# Patient Record
Sex: Female | Born: 1975 | Race: White | Hispanic: No | Marital: Single | Smoking: Current every day smoker
Health system: Southern US, Community
[De-identification: ages and names within clinical notes are randomized; demographics above are authoritative.]

## PROBLEM LIST (undated history)

## (undated) DIAGNOSIS — F319 Bipolar disorder, unspecified: Secondary | ICD-10-CM

## (undated) DIAGNOSIS — F32A Depression, unspecified: Secondary | ICD-10-CM

## (undated) DIAGNOSIS — I499 Cardiac arrhythmia, unspecified: Secondary | ICD-10-CM

## (undated) DIAGNOSIS — G5600 Carpal tunnel syndrome, unspecified upper limb: Secondary | ICD-10-CM

## (undated) DIAGNOSIS — M1990 Unspecified osteoarthritis, unspecified site: Secondary | ICD-10-CM

## (undated) DIAGNOSIS — N200 Calculus of kidney: Secondary | ICD-10-CM

## (undated) DIAGNOSIS — N390 Urinary tract infection, site not specified: Secondary | ICD-10-CM

## (undated) DIAGNOSIS — M797 Fibromyalgia: Secondary | ICD-10-CM

## (undated) DIAGNOSIS — F329 Major depressive disorder, single episode, unspecified: Secondary | ICD-10-CM

## (undated) DIAGNOSIS — B1920 Unspecified viral hepatitis C without hepatic coma: Secondary | ICD-10-CM

## (undated) DIAGNOSIS — F4310 Post-traumatic stress disorder, unspecified: Secondary | ICD-10-CM

## (undated) DIAGNOSIS — K219 Gastro-esophageal reflux disease without esophagitis: Secondary | ICD-10-CM

## (undated) DIAGNOSIS — F419 Anxiety disorder, unspecified: Secondary | ICD-10-CM

## (undated) DIAGNOSIS — I10 Essential (primary) hypertension: Secondary | ICD-10-CM

## (undated) DIAGNOSIS — J449 Chronic obstructive pulmonary disease, unspecified: Secondary | ICD-10-CM

## (undated) HISTORY — DX: Post-traumatic stress disorder, unspecified: F43.10

## (undated) HISTORY — PX: WISDOM TOOTH EXTRACTION: SHX21

## (undated) HISTORY — PX: CARPAL TUNNEL RELEASE: SHX101

## (undated) HISTORY — PX: ABDOMINAL HYSTERECTOMY: SHX81

## (undated) HISTORY — DX: Bipolar disorder, unspecified: F31.9

## (undated) HISTORY — PX: TUBAL LIGATION: SHX77

---

## 1997-09-12 ENCOUNTER — Emergency Department (HOSPITAL_COMMUNITY): Admission: EM | Admit: 1997-09-12 | Discharge: 1997-09-13 | Payer: Self-pay | Admitting: Emergency Medicine

## 1998-03-13 ENCOUNTER — Other Ambulatory Visit: Admission: RE | Admit: 1998-03-13 | Discharge: 1998-03-13 | Payer: Self-pay | Admitting: Obstetrics & Gynecology

## 1998-08-21 ENCOUNTER — Inpatient Hospital Stay (HOSPITAL_COMMUNITY): Admission: AD | Admit: 1998-08-21 | Discharge: 1998-08-21 | Payer: Self-pay | Admitting: Obstetrics & Gynecology

## 1998-10-14 ENCOUNTER — Inpatient Hospital Stay (HOSPITAL_COMMUNITY): Admission: AD | Admit: 1998-10-14 | Discharge: 1998-10-16 | Payer: Self-pay | Admitting: Obstetrics and Gynecology

## 1998-10-14 ENCOUNTER — Encounter (INDEPENDENT_AMBULATORY_CARE_PROVIDER_SITE_OTHER): Payer: Self-pay

## 1999-08-04 ENCOUNTER — Other Ambulatory Visit: Admission: RE | Admit: 1999-08-04 | Discharge: 1999-08-04 | Payer: Self-pay | Admitting: Obstetrics & Gynecology

## 2000-02-24 ENCOUNTER — Emergency Department (HOSPITAL_COMMUNITY): Admission: EM | Admit: 2000-02-24 | Discharge: 2000-02-24 | Payer: Self-pay | Admitting: Emergency Medicine

## 2000-02-24 ENCOUNTER — Encounter: Payer: Self-pay | Admitting: Emergency Medicine

## 2000-06-08 ENCOUNTER — Inpatient Hospital Stay (HOSPITAL_COMMUNITY): Admission: EM | Admit: 2000-06-08 | Discharge: 2000-06-14 | Payer: Self-pay | Admitting: *Deleted

## 2000-09-18 ENCOUNTER — Emergency Department (HOSPITAL_COMMUNITY): Admission: EM | Admit: 2000-09-18 | Discharge: 2000-09-18 | Payer: Self-pay | Admitting: Internal Medicine

## 2000-09-28 ENCOUNTER — Emergency Department (HOSPITAL_COMMUNITY): Admission: EM | Admit: 2000-09-28 | Discharge: 2000-09-29 | Payer: Self-pay | Admitting: *Deleted

## 2000-10-01 ENCOUNTER — Emergency Department (HOSPITAL_COMMUNITY): Admission: EM | Admit: 2000-10-01 | Discharge: 2000-10-01 | Payer: Self-pay | Admitting: Emergency Medicine

## 2000-10-03 ENCOUNTER — Emergency Department (HOSPITAL_COMMUNITY): Admission: EM | Admit: 2000-10-03 | Discharge: 2000-10-03 | Payer: Self-pay | Admitting: Emergency Medicine

## 2000-11-25 ENCOUNTER — Emergency Department (HOSPITAL_COMMUNITY): Admission: EM | Admit: 2000-11-25 | Discharge: 2000-11-25 | Payer: Self-pay | Admitting: Emergency Medicine

## 2000-11-25 ENCOUNTER — Encounter: Payer: Self-pay | Admitting: Emergency Medicine

## 2001-02-27 ENCOUNTER — Emergency Department (HOSPITAL_COMMUNITY): Admission: EM | Admit: 2001-02-27 | Discharge: 2001-02-27 | Payer: Self-pay | Admitting: Emergency Medicine

## 2001-02-27 ENCOUNTER — Encounter: Payer: Self-pay | Admitting: Emergency Medicine

## 2001-04-04 ENCOUNTER — Emergency Department (HOSPITAL_COMMUNITY): Admission: EM | Admit: 2001-04-04 | Discharge: 2001-04-04 | Payer: Self-pay | Admitting: Emergency Medicine

## 2001-10-04 ENCOUNTER — Emergency Department (HOSPITAL_COMMUNITY): Admission: EM | Admit: 2001-10-04 | Discharge: 2001-10-04 | Payer: Self-pay | Admitting: Emergency Medicine

## 2001-10-04 ENCOUNTER — Encounter: Payer: Self-pay | Admitting: Emergency Medicine

## 2001-12-13 ENCOUNTER — Inpatient Hospital Stay (HOSPITAL_COMMUNITY): Admission: EM | Admit: 2001-12-13 | Discharge: 2001-12-18 | Payer: Self-pay | Admitting: Psychiatry

## 2002-02-21 ENCOUNTER — Emergency Department (HOSPITAL_COMMUNITY): Admission: EM | Admit: 2002-02-21 | Discharge: 2002-02-21 | Payer: Self-pay | Admitting: Emergency Medicine

## 2002-03-15 ENCOUNTER — Encounter: Payer: Self-pay | Admitting: Emergency Medicine

## 2002-03-15 ENCOUNTER — Emergency Department (HOSPITAL_COMMUNITY): Admission: EM | Admit: 2002-03-15 | Discharge: 2002-03-15 | Payer: Self-pay | Admitting: Emergency Medicine

## 2002-03-23 ENCOUNTER — Emergency Department (HOSPITAL_COMMUNITY): Admission: EM | Admit: 2002-03-23 | Discharge: 2002-03-23 | Payer: Self-pay | Admitting: Emergency Medicine

## 2002-03-28 ENCOUNTER — Encounter: Payer: Self-pay | Admitting: *Deleted

## 2002-03-28 ENCOUNTER — Emergency Department (HOSPITAL_COMMUNITY): Admission: EM | Admit: 2002-03-28 | Discharge: 2002-03-28 | Payer: Self-pay | Admitting: *Deleted

## 2002-04-02 ENCOUNTER — Inpatient Hospital Stay (HOSPITAL_COMMUNITY): Admission: AD | Admit: 2002-04-02 | Discharge: 2002-04-02 | Payer: Self-pay | Admitting: *Deleted

## 2002-04-04 ENCOUNTER — Encounter: Admission: RE | Admit: 2002-04-04 | Discharge: 2002-04-04 | Payer: Self-pay | Admitting: Obstetrics and Gynecology

## 2002-06-05 ENCOUNTER — Emergency Department (HOSPITAL_COMMUNITY): Admission: EM | Admit: 2002-06-05 | Discharge: 2002-06-05 | Payer: Self-pay | Admitting: Emergency Medicine

## 2002-06-05 ENCOUNTER — Encounter: Payer: Self-pay | Admitting: Emergency Medicine

## 2002-07-10 ENCOUNTER — Emergency Department (HOSPITAL_COMMUNITY): Admission: EM | Admit: 2002-07-10 | Discharge: 2002-07-10 | Payer: Self-pay | Admitting: Emergency Medicine

## 2002-07-10 ENCOUNTER — Emergency Department (HOSPITAL_COMMUNITY): Admission: EM | Admit: 2002-07-10 | Discharge: 2002-07-10 | Payer: Self-pay

## 2002-07-11 ENCOUNTER — Encounter: Payer: Self-pay | Admitting: Emergency Medicine

## 2002-10-29 ENCOUNTER — Emergency Department (HOSPITAL_COMMUNITY): Admission: EM | Admit: 2002-10-29 | Discharge: 2002-10-29 | Payer: Self-pay | Admitting: Emergency Medicine

## 2002-12-02 ENCOUNTER — Emergency Department (HOSPITAL_COMMUNITY): Admission: EM | Admit: 2002-12-02 | Discharge: 2002-12-02 | Payer: Self-pay | Admitting: Emergency Medicine

## 2003-01-30 ENCOUNTER — Encounter: Admission: RE | Admit: 2003-01-30 | Discharge: 2003-01-30 | Payer: Self-pay | Admitting: Family Medicine

## 2003-02-17 ENCOUNTER — Inpatient Hospital Stay (HOSPITAL_COMMUNITY): Admission: AD | Admit: 2003-02-17 | Discharge: 2003-02-17 | Payer: Self-pay | Admitting: Obstetrics and Gynecology

## 2003-02-19 ENCOUNTER — Encounter (INDEPENDENT_AMBULATORY_CARE_PROVIDER_SITE_OTHER): Payer: Self-pay | Admitting: *Deleted

## 2003-02-19 ENCOUNTER — Inpatient Hospital Stay (HOSPITAL_COMMUNITY): Admission: RE | Admit: 2003-02-19 | Discharge: 2003-02-21 | Payer: Self-pay | Admitting: Obstetrics and Gynecology

## 2003-02-25 ENCOUNTER — Inpatient Hospital Stay (HOSPITAL_COMMUNITY): Admission: AD | Admit: 2003-02-25 | Discharge: 2003-02-25 | Payer: Self-pay | Admitting: Family Medicine

## 2003-03-04 ENCOUNTER — Encounter: Admission: RE | Admit: 2003-03-04 | Discharge: 2003-03-04 | Payer: Self-pay | Admitting: Obstetrics and Gynecology

## 2003-04-07 ENCOUNTER — Emergency Department (HOSPITAL_COMMUNITY): Admission: EM | Admit: 2003-04-07 | Discharge: 2003-04-07 | Payer: Self-pay | Admitting: Emergency Medicine

## 2003-04-15 ENCOUNTER — Encounter: Admission: RE | Admit: 2003-04-15 | Discharge: 2003-04-15 | Payer: Self-pay | Admitting: Obstetrics and Gynecology

## 2003-06-13 ENCOUNTER — Emergency Department (HOSPITAL_COMMUNITY): Admission: EM | Admit: 2003-06-13 | Discharge: 2003-06-13 | Payer: Self-pay | Admitting: Family Medicine

## 2003-07-13 ENCOUNTER — Emergency Department (HOSPITAL_COMMUNITY): Admission: AD | Admit: 2003-07-13 | Discharge: 2003-07-13 | Payer: Self-pay | Admitting: Family Medicine

## 2003-07-14 ENCOUNTER — Emergency Department (HOSPITAL_COMMUNITY): Admission: EM | Admit: 2003-07-14 | Discharge: 2003-07-14 | Payer: Self-pay | Admitting: Emergency Medicine

## 2003-07-21 ENCOUNTER — Emergency Department (HOSPITAL_COMMUNITY): Admission: EM | Admit: 2003-07-21 | Discharge: 2003-07-21 | Payer: Self-pay

## 2003-07-23 ENCOUNTER — Emergency Department (HOSPITAL_COMMUNITY): Admission: EM | Admit: 2003-07-23 | Discharge: 2003-07-23 | Payer: Self-pay | Admitting: Emergency Medicine

## 2003-07-28 ENCOUNTER — Emergency Department (HOSPITAL_COMMUNITY): Admission: EM | Admit: 2003-07-28 | Discharge: 2003-07-28 | Payer: Self-pay

## 2003-08-01 ENCOUNTER — Emergency Department (HOSPITAL_COMMUNITY): Admission: EM | Admit: 2003-08-01 | Discharge: 2003-08-01 | Payer: Self-pay | Admitting: Family Medicine

## 2003-09-11 ENCOUNTER — Emergency Department (HOSPITAL_COMMUNITY): Admission: EM | Admit: 2003-09-11 | Discharge: 2003-09-11 | Payer: Self-pay | Admitting: Emergency Medicine

## 2004-02-16 ENCOUNTER — Emergency Department (HOSPITAL_COMMUNITY): Admission: EM | Admit: 2004-02-16 | Discharge: 2004-02-16 | Payer: Self-pay | Admitting: Family Medicine

## 2004-08-17 ENCOUNTER — Emergency Department (HOSPITAL_COMMUNITY): Admission: EM | Admit: 2004-08-17 | Discharge: 2004-08-17 | Payer: Self-pay | Admitting: Family Medicine

## 2004-10-14 ENCOUNTER — Emergency Department (HOSPITAL_COMMUNITY): Admission: EM | Admit: 2004-10-14 | Discharge: 2004-10-14 | Payer: Self-pay | Admitting: Emergency Medicine

## 2004-12-02 ENCOUNTER — Emergency Department (HOSPITAL_COMMUNITY): Admission: EM | Admit: 2004-12-02 | Discharge: 2004-12-03 | Payer: Self-pay | Admitting: Emergency Medicine

## 2004-12-03 ENCOUNTER — Emergency Department (HOSPITAL_COMMUNITY): Admission: EM | Admit: 2004-12-03 | Discharge: 2004-12-03 | Payer: Self-pay | Admitting: Family Medicine

## 2005-01-20 ENCOUNTER — Emergency Department (HOSPITAL_COMMUNITY): Admission: EM | Admit: 2005-01-20 | Discharge: 2005-01-20 | Payer: Self-pay | Admitting: Emergency Medicine

## 2005-02-07 ENCOUNTER — Emergency Department (HOSPITAL_COMMUNITY): Admission: EM | Admit: 2005-02-07 | Discharge: 2005-02-07 | Payer: Self-pay | Admitting: Emergency Medicine

## 2005-03-05 ENCOUNTER — Emergency Department (HOSPITAL_COMMUNITY): Admission: EM | Admit: 2005-03-05 | Discharge: 2005-03-05 | Payer: Self-pay | Admitting: Emergency Medicine

## 2005-10-23 ENCOUNTER — Emergency Department (HOSPITAL_COMMUNITY): Admission: EM | Admit: 2005-10-23 | Discharge: 2005-10-23 | Payer: Self-pay | Admitting: Emergency Medicine

## 2005-11-20 ENCOUNTER — Emergency Department (HOSPITAL_COMMUNITY): Admission: EM | Admit: 2005-11-20 | Discharge: 2005-11-21 | Payer: Self-pay | Admitting: Emergency Medicine

## 2006-02-01 IMAGING — CR DG CHEST 2V
2 series · 2 of 2 positions shown · non-contrast
Comparison: none

CLINICAL DATA: Chest pain, cough, and fever.
 TWO-VIEW CHEST, 06/13/03
 Comparison 03/28/02.
 Stable normal-sized heart and clear lungs.  Minimal scoliosis.
 IMPRESSION 
 No acute abnormality.

[view not recorded (1 of 2)]
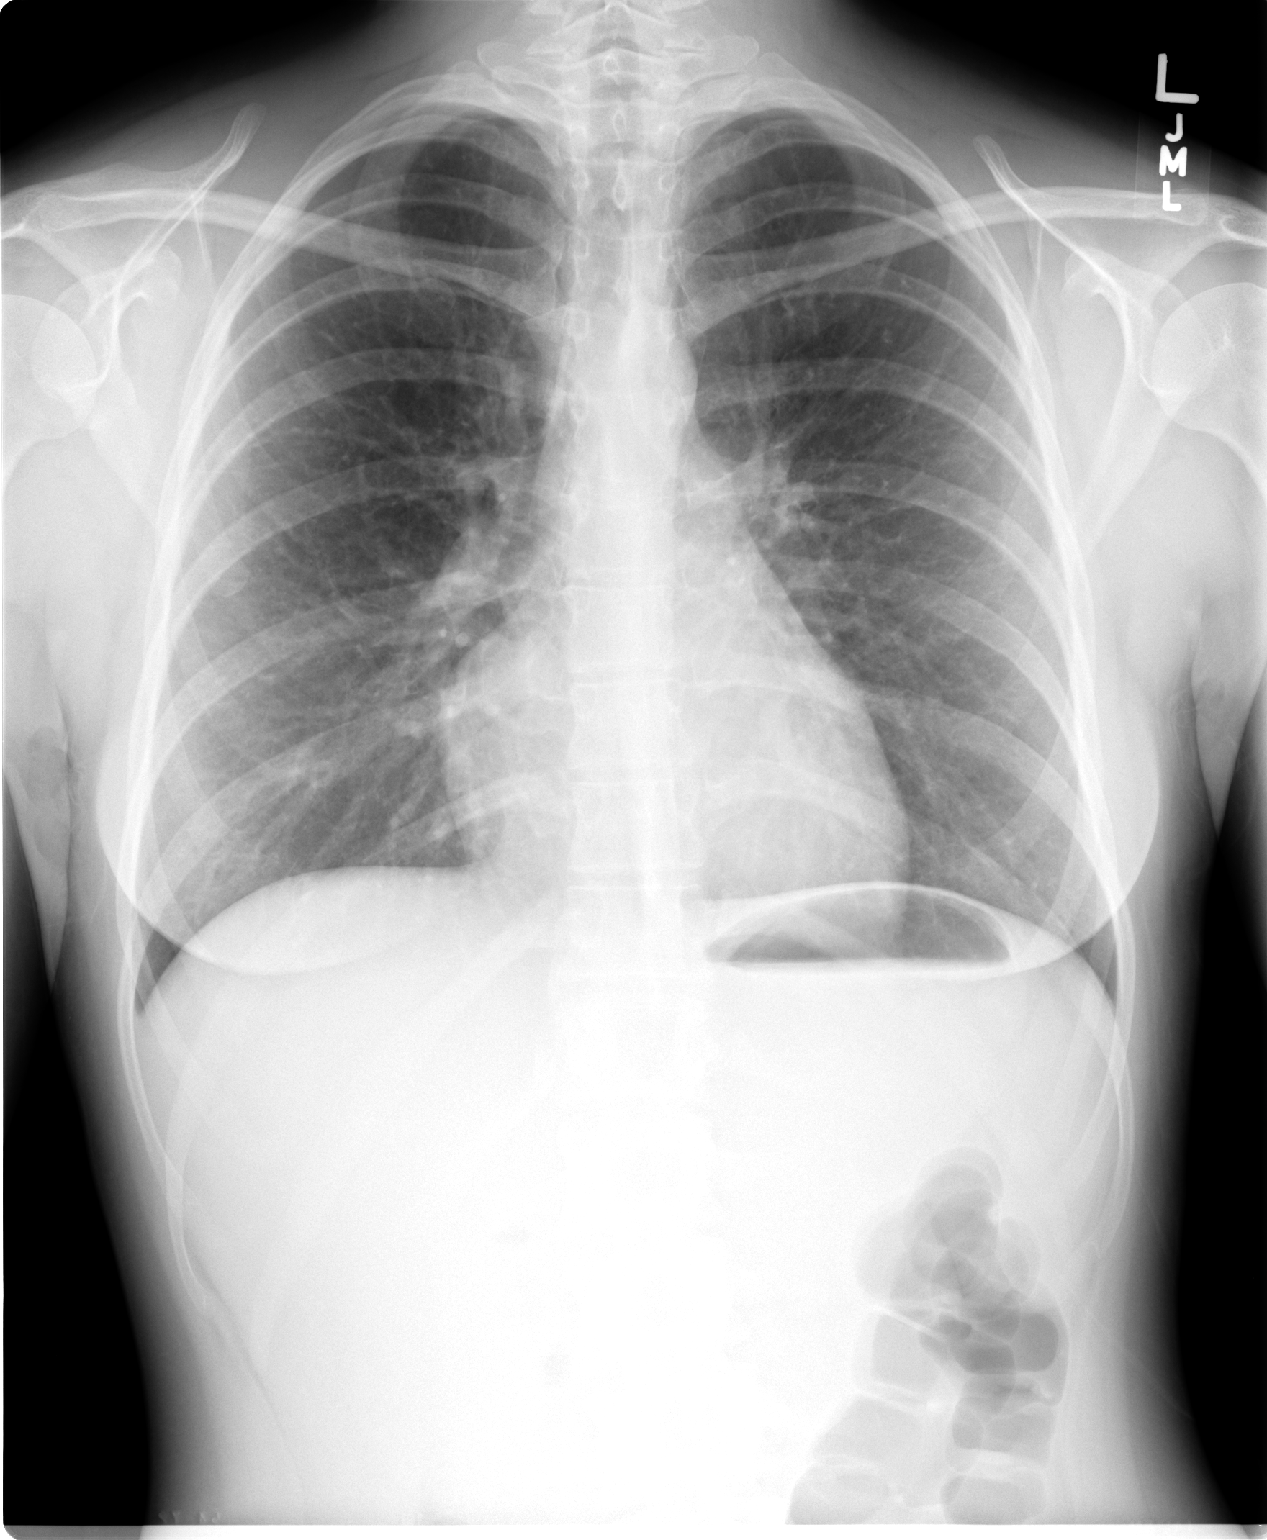

[view not recorded (2 of 2)]
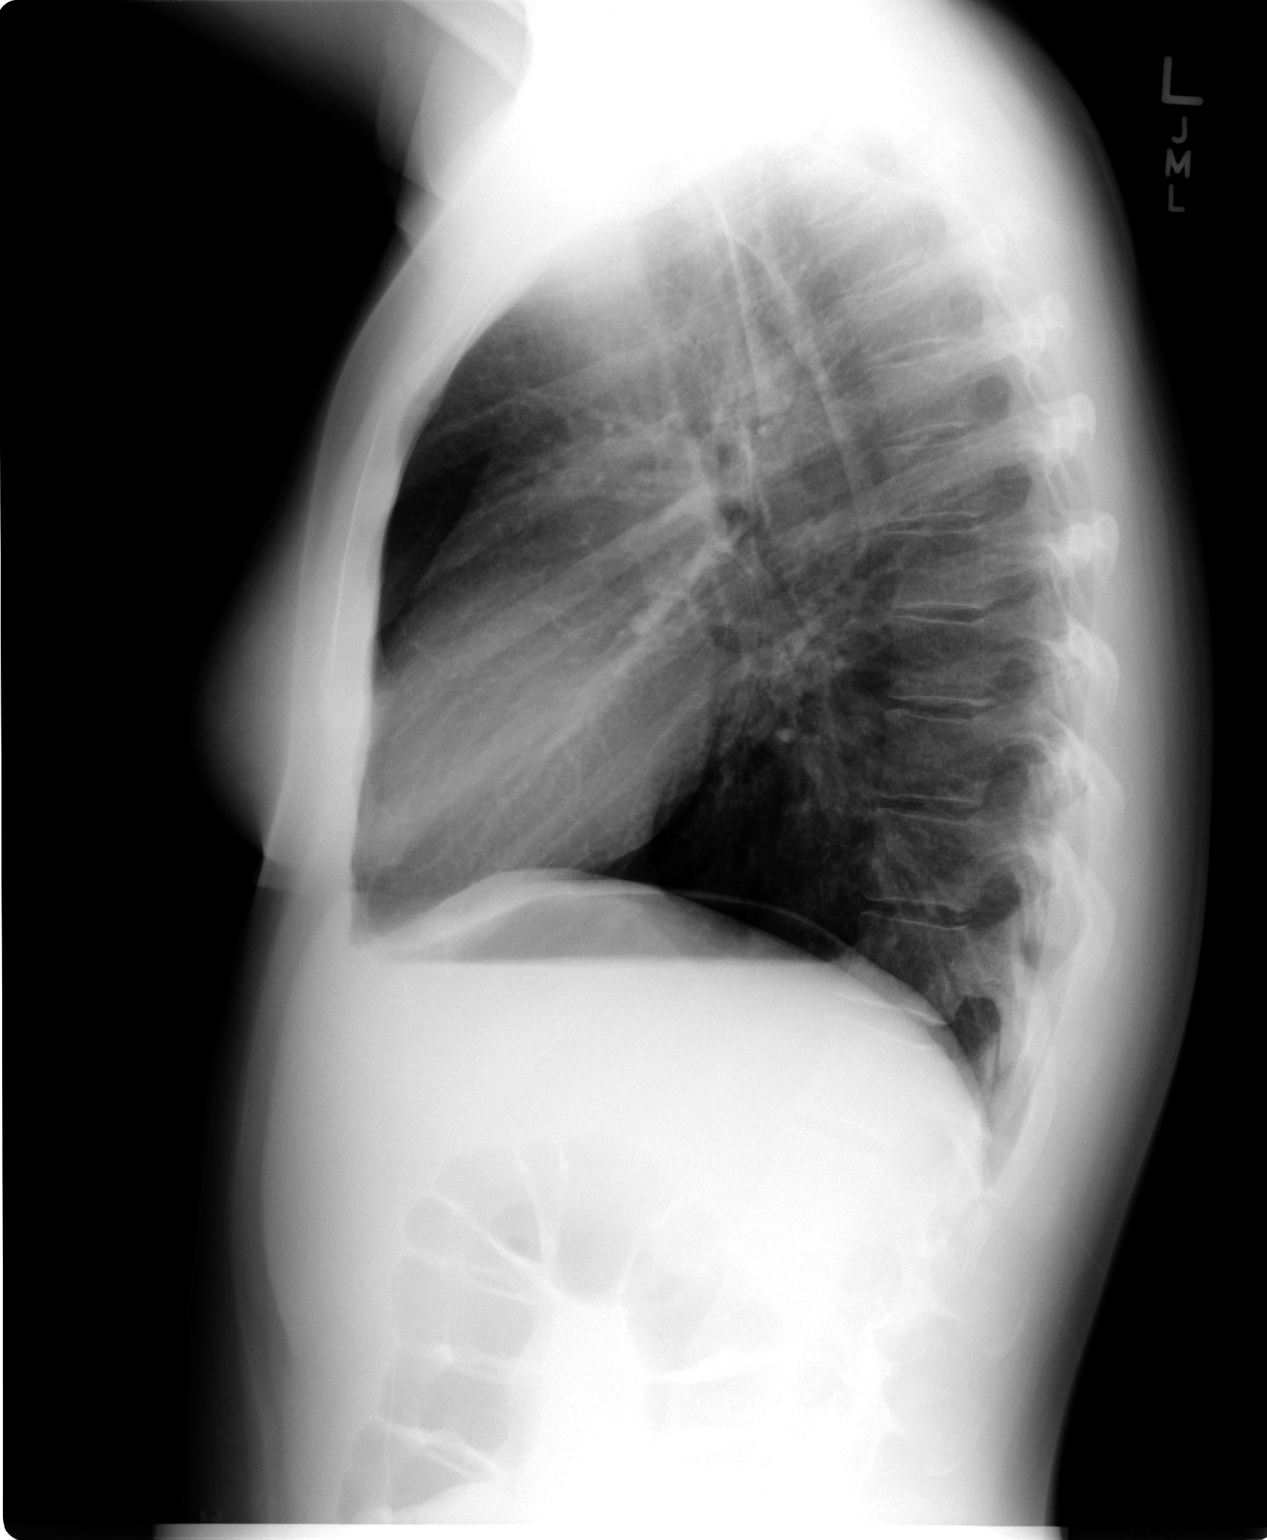

[2 of 2 positions shown; findings below may reference images not displayed]

## 2006-03-04 IMAGING — CR DG HIP COMPLETE 2+V*R*
2 series · 2 of 2 positions shown · non-contrast
Comparison: none

CLINICAL DATA: 28 year old female, fall with right hip pain.  
 RIGHT HIP COMPLETE ? 2 VIEWS

[view not recorded (1 of 2)]
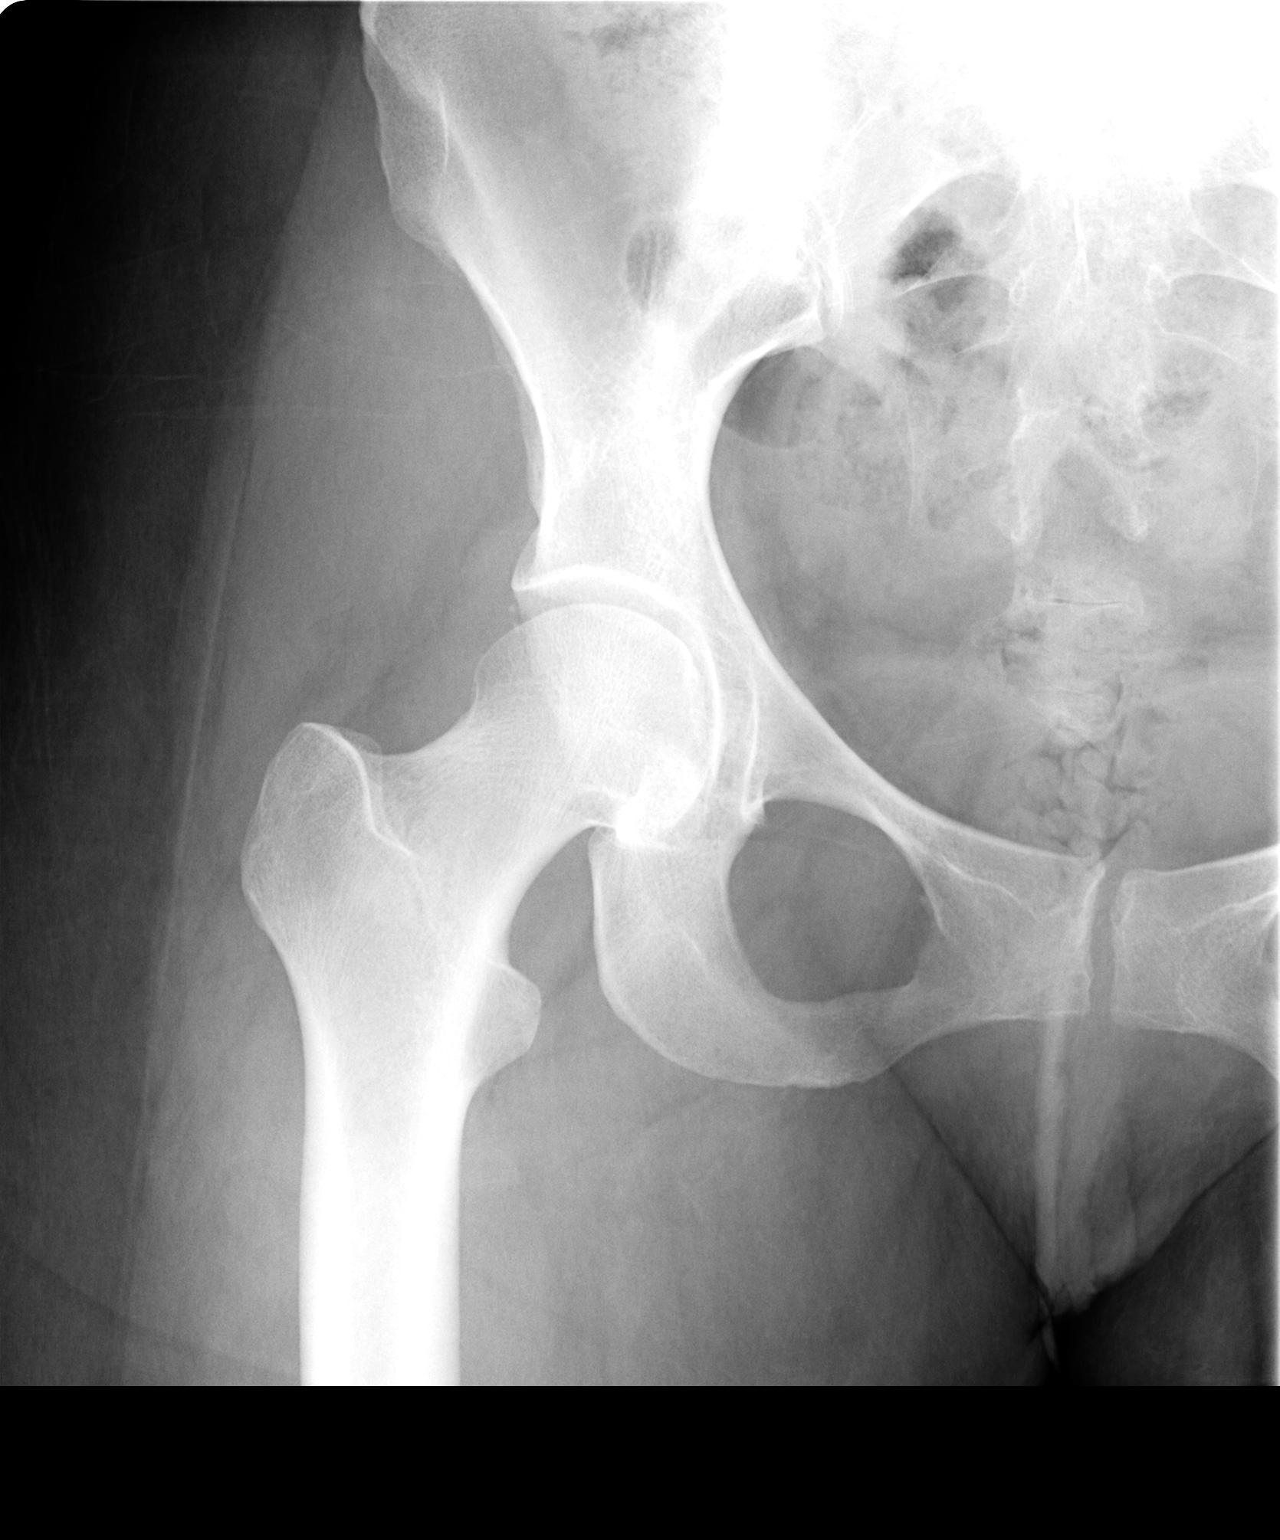

[view not recorded (2 of 2)]
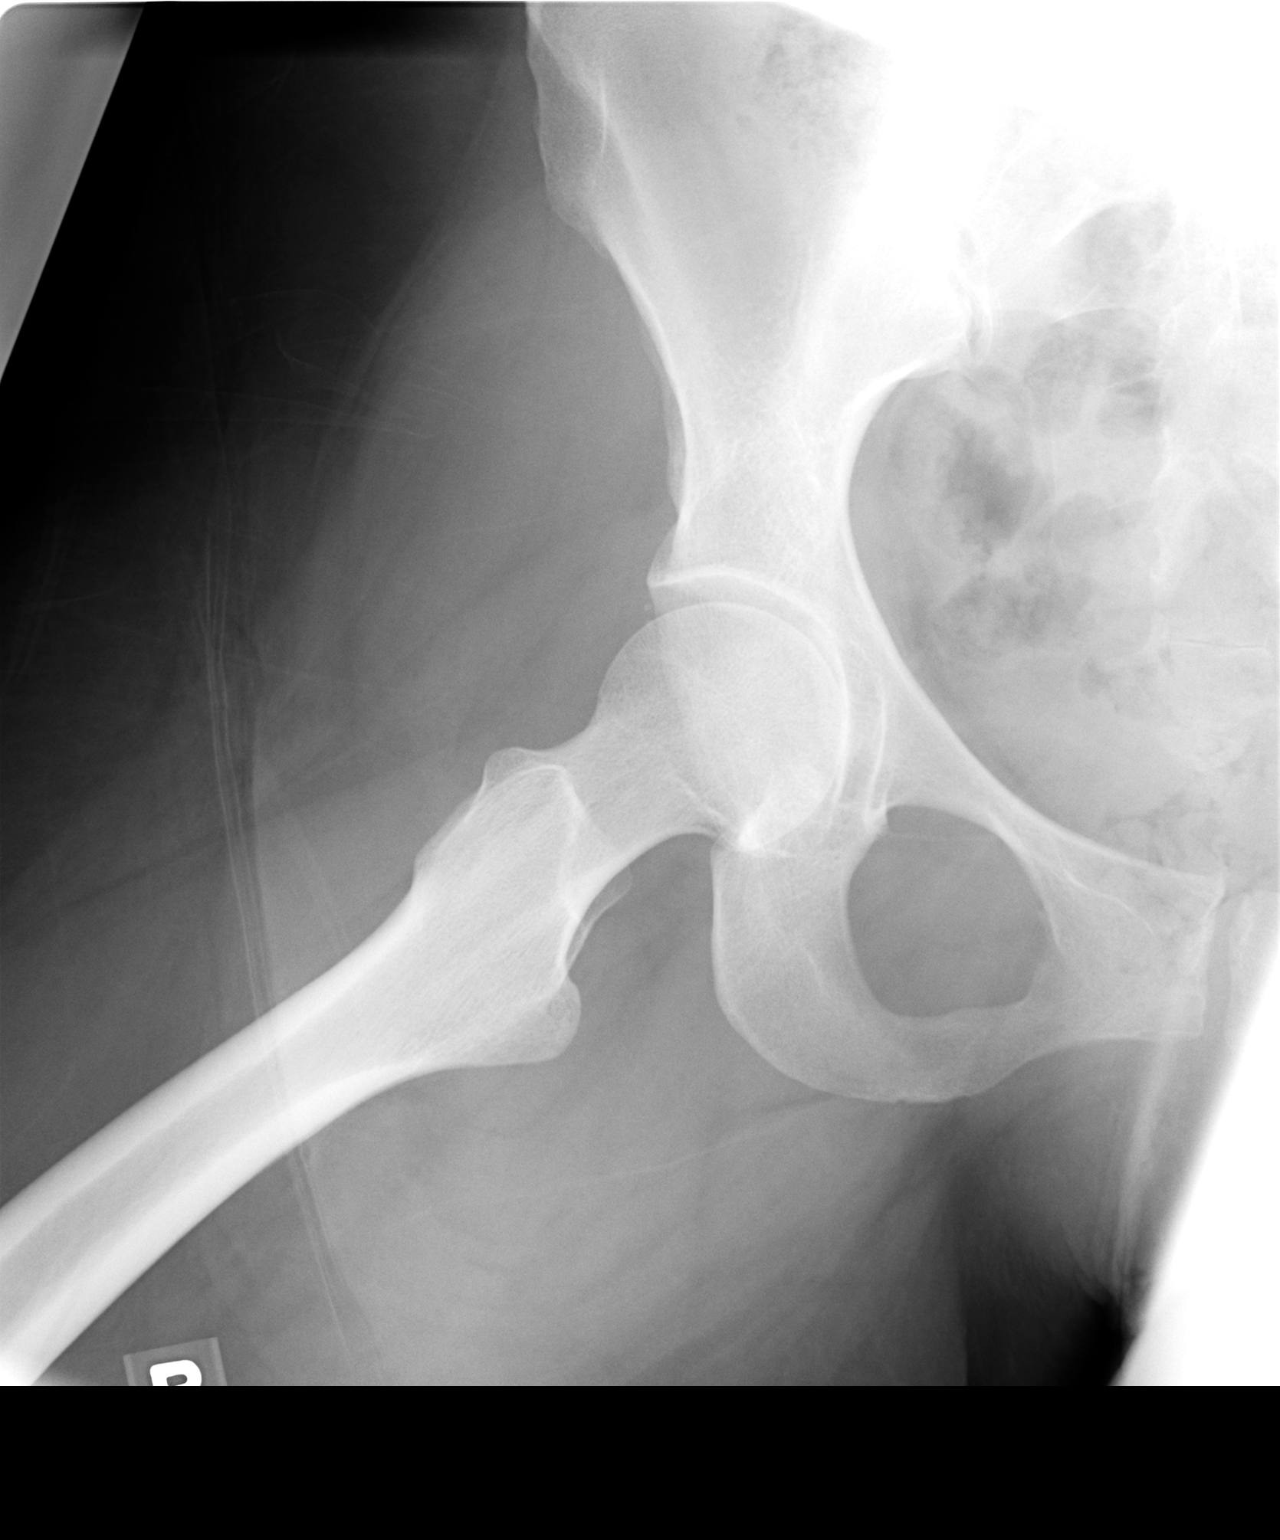

[2 of 2 positions shown; findings below may reference images not displayed]

FINDINGS: The right hip is located.  No acute radiolucent fracture or malalignment.  The rami are intact.  The symphysis is aligned.
 IMPRESSION
 No acute finding by plain radiography.
 SINGLE VIEW PELVIS
FINDINGS: Bone density is normal.   The hips are located and symmetric.  No acute radiolucent fracture line or disruption of the trabecular pattern in the femoral neck regions. The rami are intact.  The symphysis is alignment.  Focal 7 mm sclerosis is noted in the right iliac bone which probably represents a bone island. 
 IMPRESSION
 No acute finding by plain radiography.
 LEFT FOREARM 2 VIEWS
FINDINGS: Alignment is anatomic.  No acute fracture or significant soft tissue swelling.  Bone density is normal.   There is soft tissue swelling over the midforearm region.  Again no underlying bony trauma.
 IMPRESSION
 Left forearm focal soft tissue swelling/bruising.  No underlying fracture or malalignment.
 CERVICAL SPINE COMPLETE 5 VIEWS
FINDINGS: Alignment is anatomic.   Vertebral body height and disk spaces are well maintained.  No acute compression fracture deformity.  Prevertebral soft tissues are normal.   The intervertebral foramina are patent.  Facets are aligned.  C1-2 aligned.  The odontoid appears intact.
 IMPRESSION
 No acute finding by plain radiography.

## 2006-03-04 IMAGING — CR DG PELVIS 1-2V
1 series · 1 of 1 positions shown · non-contrast
Comparison: none

CLINICAL DATA: 28 year old female, fall with right hip pain.  
 RIGHT HIP COMPLETE ? 2 VIEWS

[view not recorded]
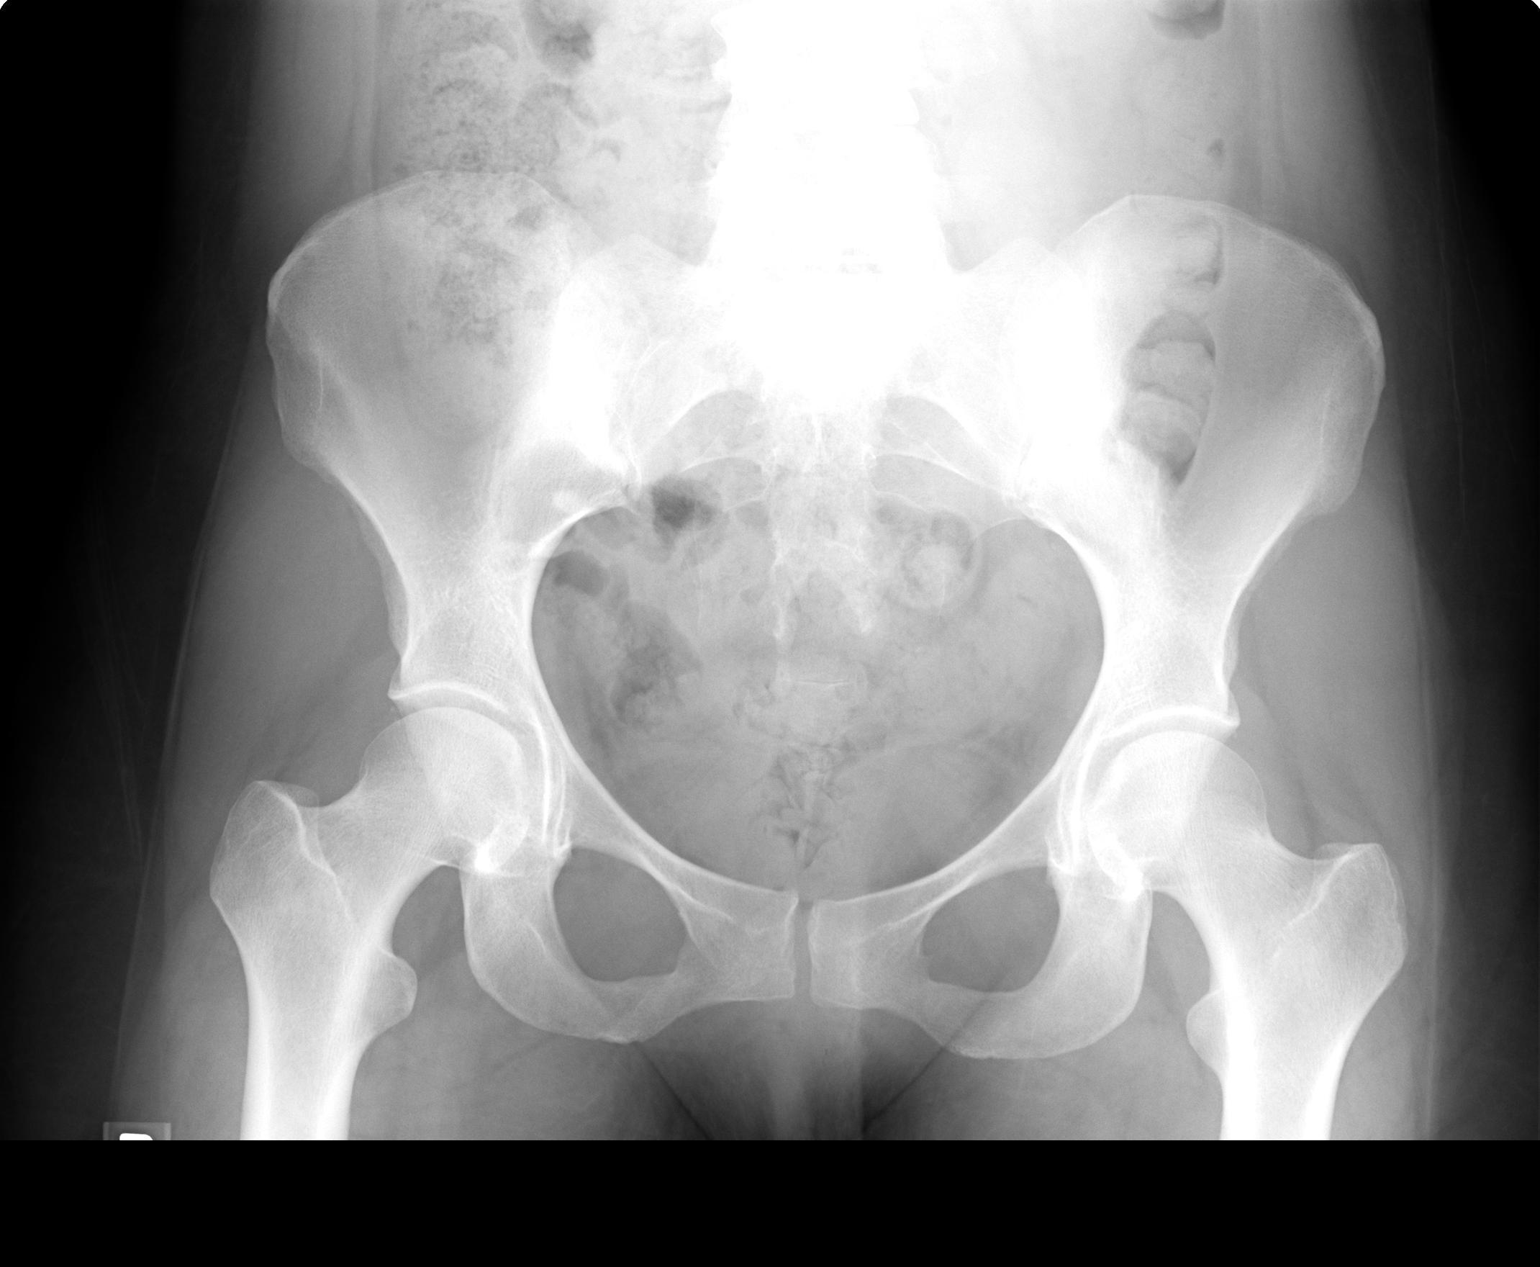

[1 of 1 positions shown; findings below may reference images not displayed]

FINDINGS: The right hip is located.  No acute radiolucent fracture or malalignment.  The rami are intact.  The symphysis is aligned.
 IMPRESSION
 No acute finding by plain radiography.
 SINGLE VIEW PELVIS
FINDINGS: Bone density is normal.   The hips are located and symmetric.  No acute radiolucent fracture line or disruption of the trabecular pattern in the femoral neck regions. The rami are intact.  The symphysis is alignment.  Focal 7 mm sclerosis is noted in the right iliac bone which probably represents a bone island. 
 IMPRESSION
 No acute finding by plain radiography.
 LEFT FOREARM 2 VIEWS
FINDINGS: Alignment is anatomic.  No acute fracture or significant soft tissue swelling.  Bone density is normal.   There is soft tissue swelling over the midforearm region.  Again no underlying bony trauma.
 IMPRESSION
 Left forearm focal soft tissue swelling/bruising.  No underlying fracture or malalignment.
 CERVICAL SPINE COMPLETE 5 VIEWS
FINDINGS: Alignment is anatomic.   Vertebral body height and disk spaces are well maintained.  No acute compression fracture deformity.  Prevertebral soft tissues are normal.   The intervertebral foramina are patent.  Facets are aligned.  C1-2 aligned.  The odontoid appears intact.
 IMPRESSION
 No acute finding by plain radiography.

## 2006-03-04 IMAGING — CR DG CERVICAL SPINE COMPLETE 4+V
5 series · 5 of 5 positions shown · non-contrast
Comparison: none

CLINICAL DATA: 28 year old female, fall with right hip pain.  
 RIGHT HIP COMPLETE ? 2 VIEWS

[view not recorded (1 of 5)]
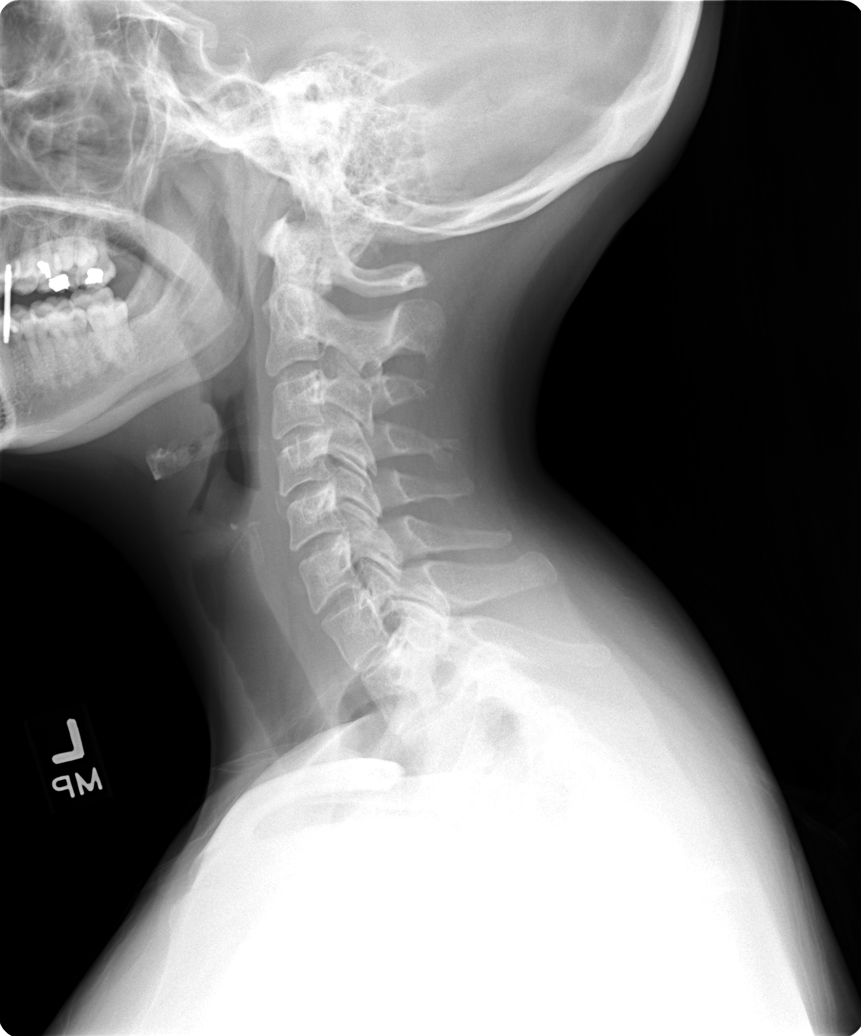

[view not recorded (2 of 5)]
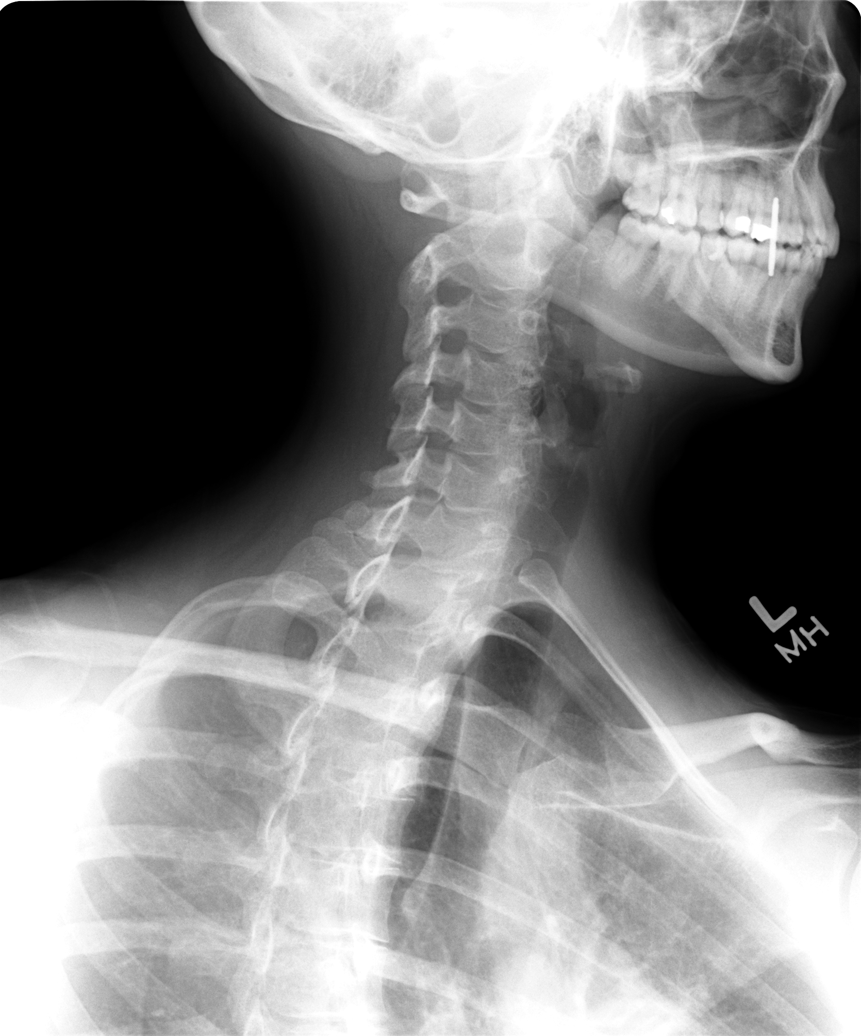

[view not recorded (3 of 5)]
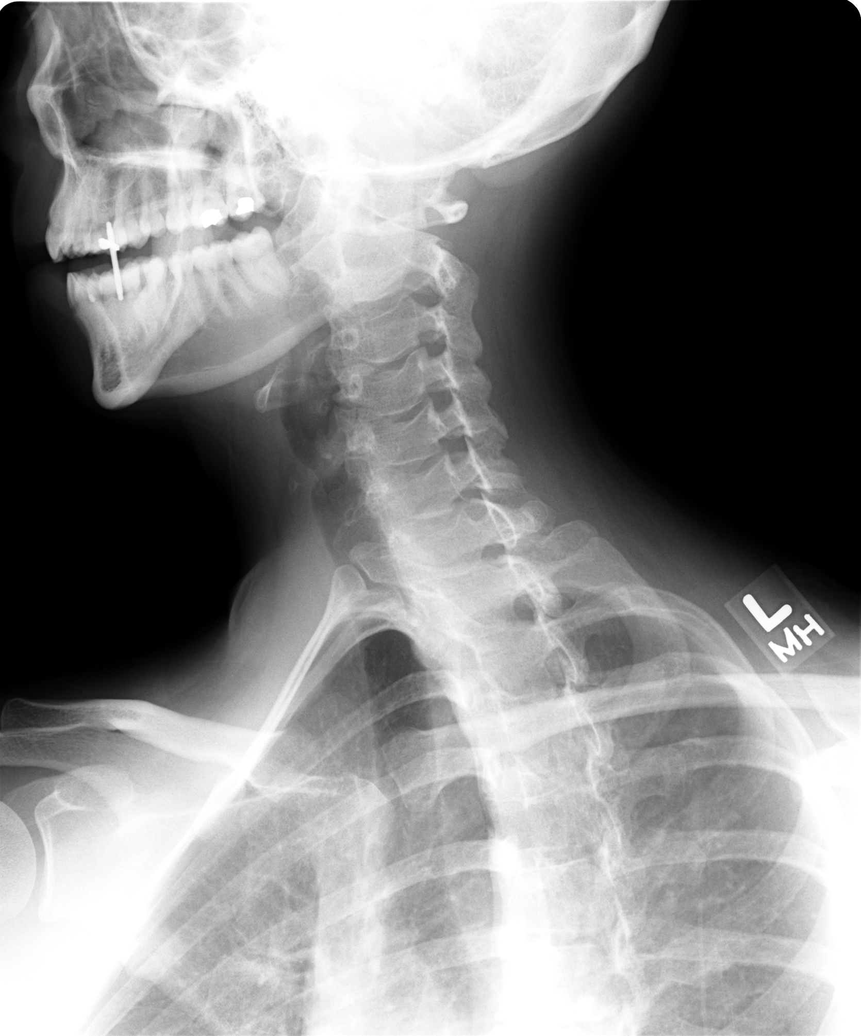

[view not recorded (4 of 5)]
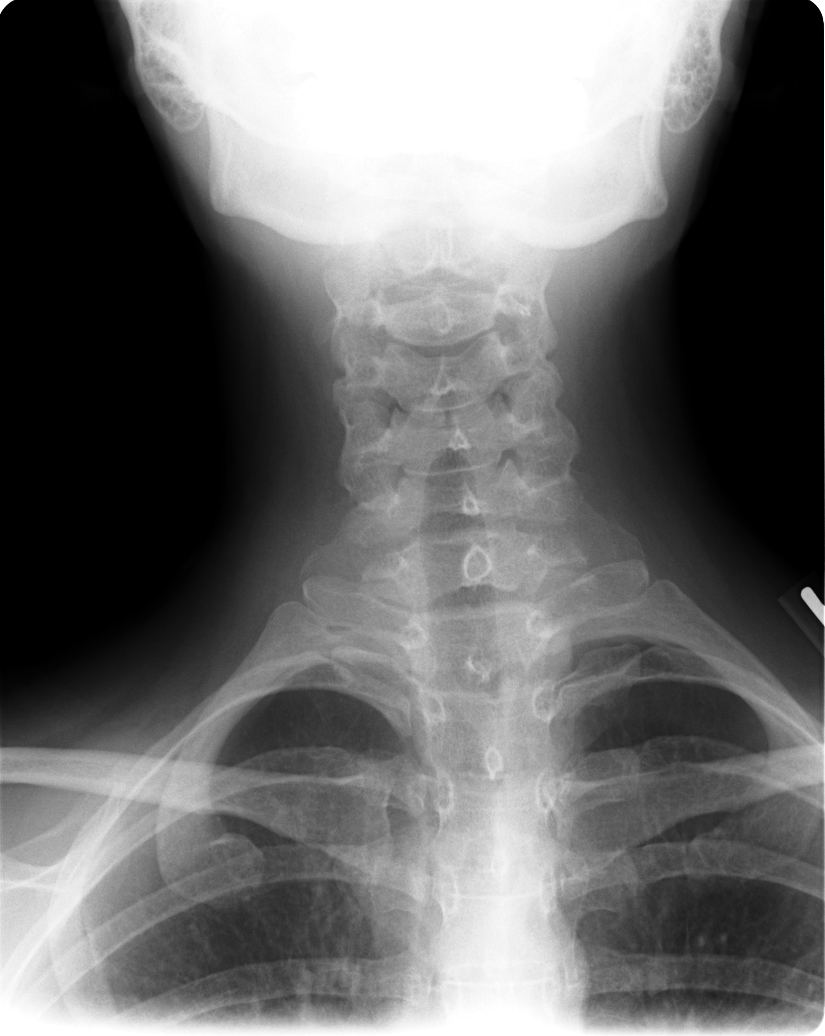

[view not recorded (5 of 5)]
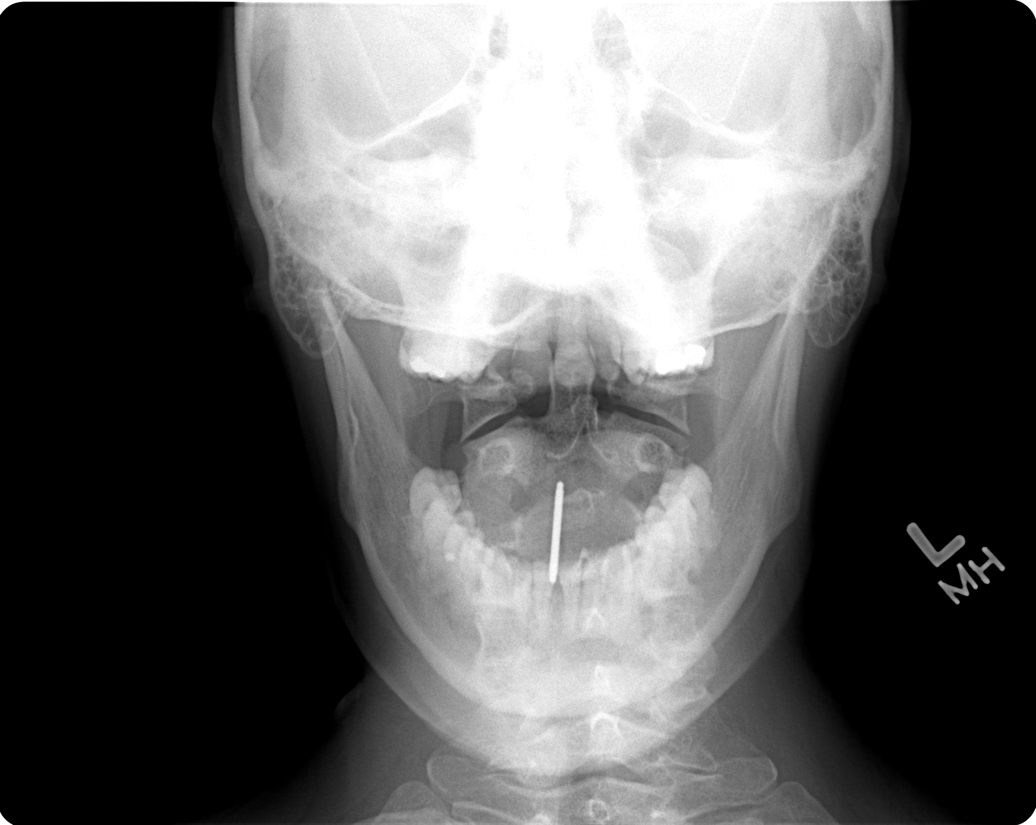

[5 of 5 positions shown; findings below may reference images not displayed]

FINDINGS: The right hip is located.  No acute radiolucent fracture or malalignment.  The rami are intact.  The symphysis is aligned.
 IMPRESSION
 No acute finding by plain radiography.
 SINGLE VIEW PELVIS
FINDINGS: Bone density is normal.   The hips are located and symmetric.  No acute radiolucent fracture line or disruption of the trabecular pattern in the femoral neck regions. The rami are intact.  The symphysis is alignment.  Focal 7 mm sclerosis is noted in the right iliac bone which probably represents a bone island. 
 IMPRESSION
 No acute finding by plain radiography.
 LEFT FOREARM 2 VIEWS
FINDINGS: Alignment is anatomic.  No acute fracture or significant soft tissue swelling.  Bone density is normal.   There is soft tissue swelling over the midforearm region.  Again no underlying bony trauma.
 IMPRESSION
 Left forearm focal soft tissue swelling/bruising.  No underlying fracture or malalignment.
 CERVICAL SPINE COMPLETE 5 VIEWS
FINDINGS: Alignment is anatomic.   Vertebral body height and disk spaces are well maintained.  No acute compression fracture deformity.  Prevertebral soft tissues are normal.   The intervertebral foramina are patent.  Facets are aligned.  C1-2 aligned.  The odontoid appears intact.
 IMPRESSION
 No acute finding by plain radiography.

## 2006-03-06 ENCOUNTER — Emergency Department (HOSPITAL_COMMUNITY): Admission: EM | Admit: 2006-03-06 | Discharge: 2006-03-06 | Payer: Self-pay | Admitting: Emergency Medicine
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
## 2006-08-22 ENCOUNTER — Emergency Department (HOSPITAL_COMMUNITY): Admission: EM | Admit: 2006-08-22 | Discharge: 2006-08-22 | Payer: Self-pay | Admitting: *Deleted

## 2006-08-22 ENCOUNTER — Inpatient Hospital Stay (HOSPITAL_COMMUNITY): Admission: AD | Admit: 2006-08-22 | Discharge: 2006-08-28 | Payer: Self-pay | Admitting: Psychiatry

## 2006-08-22 ENCOUNTER — Ambulatory Visit: Payer: Self-pay | Admitting: Psychiatry

## 2006-12-01 ENCOUNTER — Emergency Department (HOSPITAL_COMMUNITY): Admission: EM | Admit: 2006-12-01 | Discharge: 2006-12-01 | Payer: Self-pay | Admitting: Emergency Medicine

## 2007-01-30 ENCOUNTER — Emergency Department (HOSPITAL_COMMUNITY): Admission: EM | Admit: 2007-01-30 | Discharge: 2007-01-30 | Payer: Self-pay | Admitting: Emergency Medicine

## 2007-05-10 ENCOUNTER — Emergency Department (HOSPITAL_COMMUNITY): Admission: EM | Admit: 2007-05-10 | Discharge: 2007-05-10 | Payer: Self-pay | Admitting: Emergency Medicine
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
## 2007-06-20 ENCOUNTER — Emergency Department (HOSPITAL_COMMUNITY): Admission: EM | Admit: 2007-06-20 | Discharge: 2007-06-20 | Payer: Self-pay | Admitting: Emergency Medicine

## 2007-07-02 ENCOUNTER — Emergency Department (HOSPITAL_COMMUNITY): Admission: EM | Admit: 2007-07-02 | Discharge: 2007-07-02 | Payer: Self-pay | Admitting: Emergency Medicine

## 2007-08-20 ENCOUNTER — Emergency Department (HOSPITAL_COMMUNITY): Admission: EM | Admit: 2007-08-20 | Discharge: 2007-08-20 | Payer: Self-pay | Admitting: Emergency Medicine
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
## 2007-10-25 IMAGING — CR DG SHOULDER 2+V*L*
3 series · 3 of 3 positions shown · non-contrast
Comparison: 07/21/03.
COMPARISON: None.

CLINICAL DATA: Fell--low back and left shoulder pain.  
 LUMBAR SPINE - 5 VIEW:

[t shoulder ap external left]
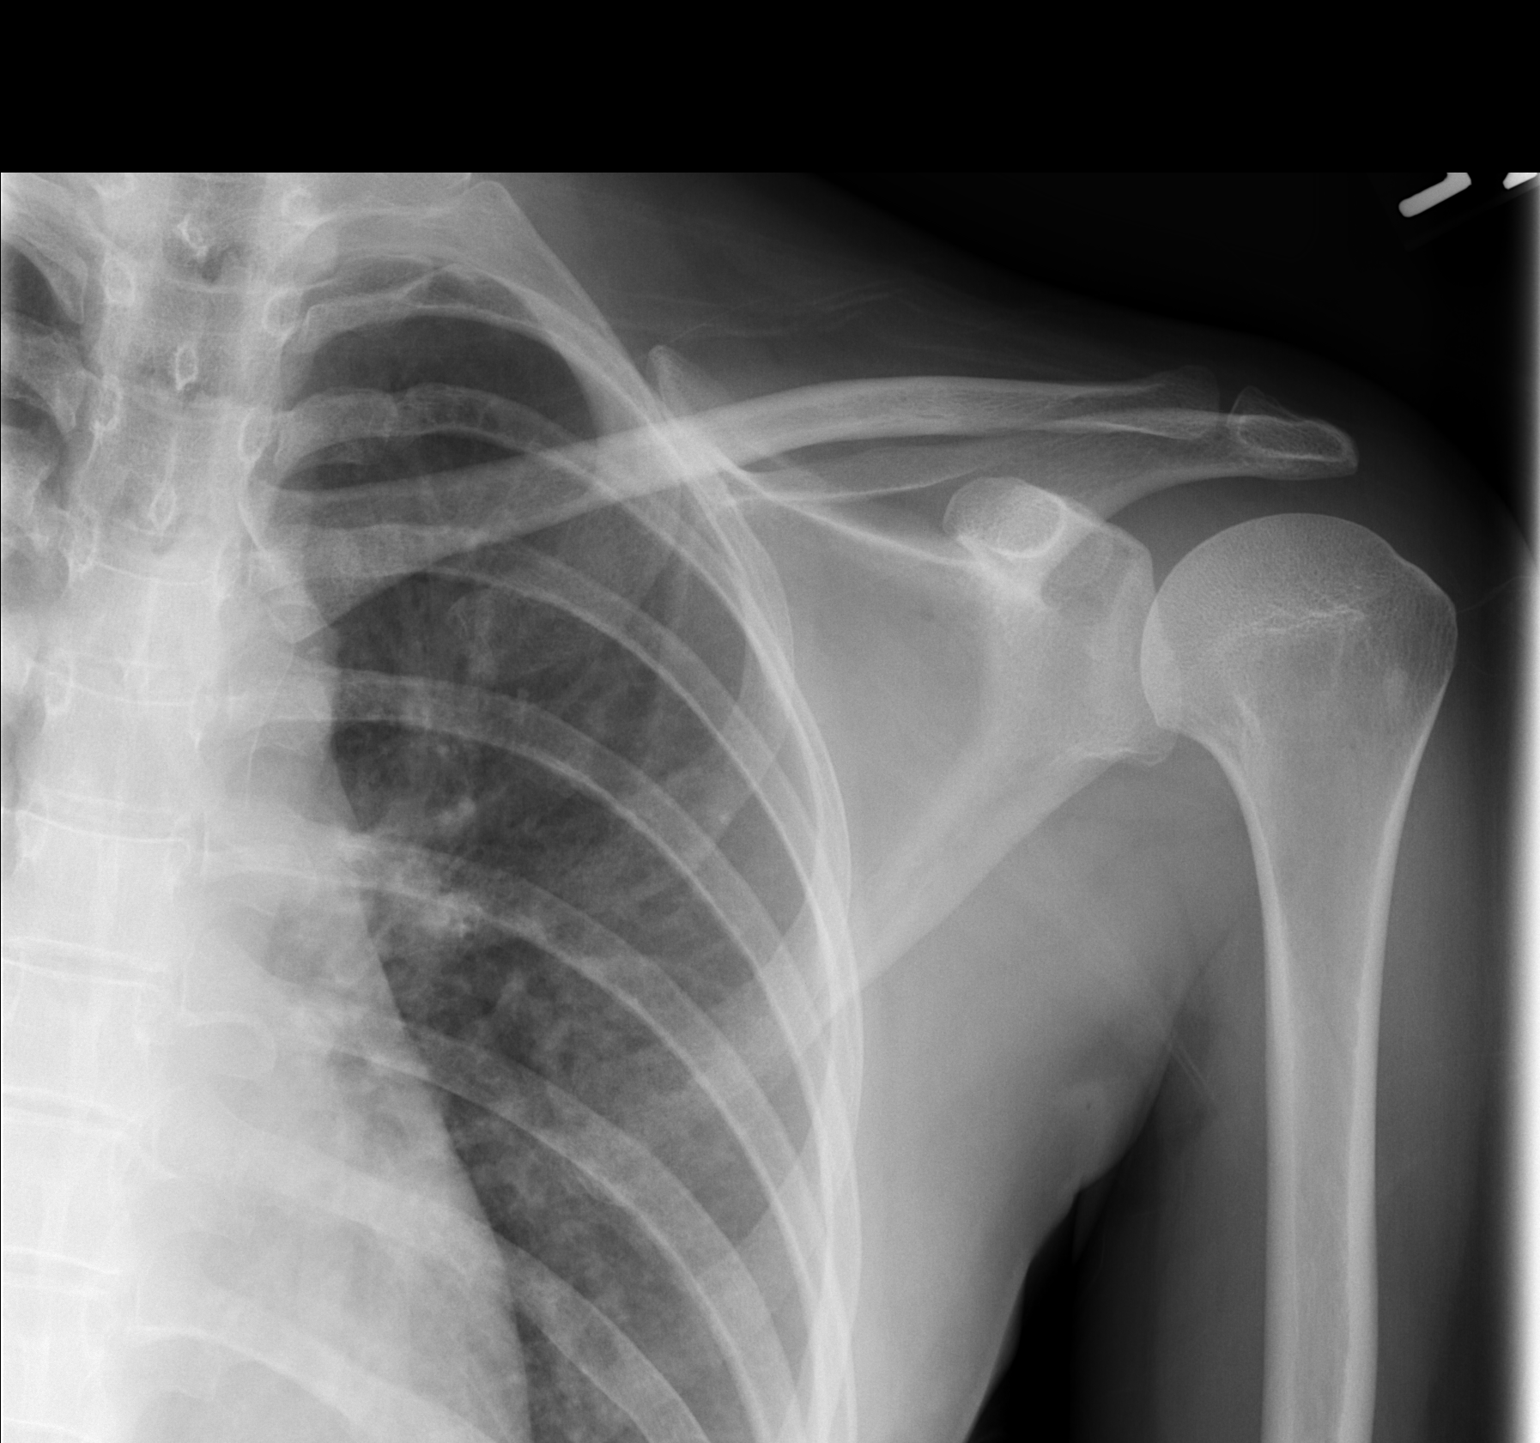

[t shoulder ap internal left]
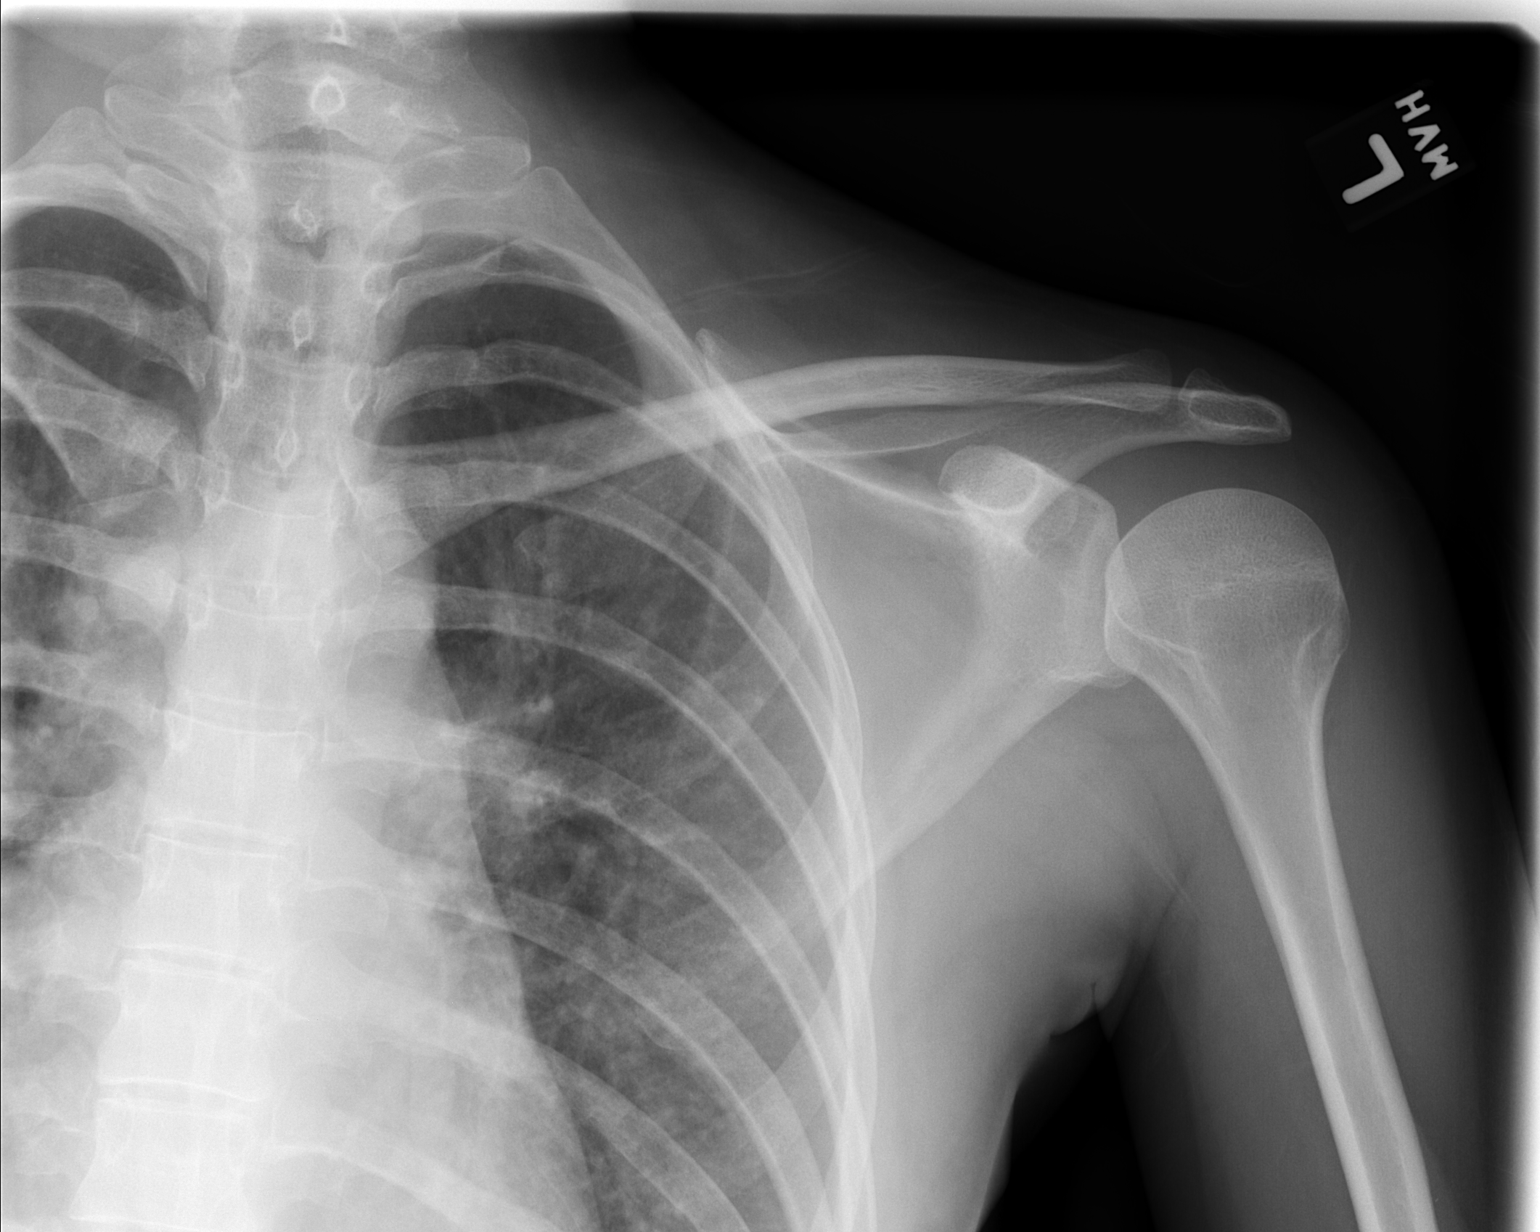

[t shoulder y view left]
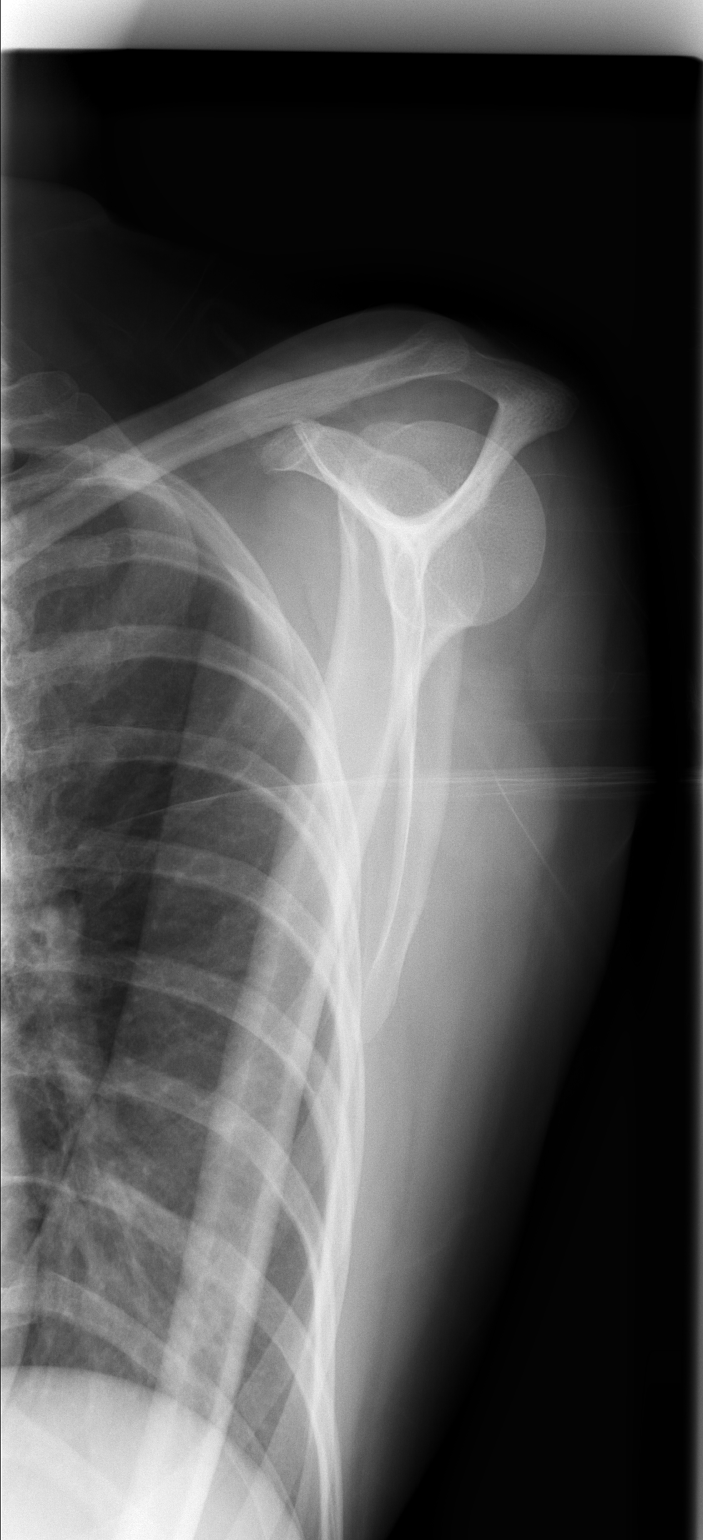

[3 of 3 positions shown; findings below may reference images not displayed]

FINDINGS: There is a mild dextroscoliosis that was not present previously.  This may be due to muscle spasm.  No fracture or acute bony abnormality.  Soft tissues unremarkable.
 The right SI joint looks abnormal on the AP view, but it looks satisfactory on the oblique views.  There is probably bowel overlying it on the AP.  I do not think it is abnormal.
IMPRESSION: Mild scoliosis--otherwise, no acute abnormality.  
 LEFT SHOULDER - 3 VIEW:
FINDINGS: No acute abnormality of the bones or soft tissues.  There is a sclerotic bone island in the humeral head.
IMPRESSION: No acute or significant findings.
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
## 2007-12-10 ENCOUNTER — Emergency Department (HOSPITAL_COMMUNITY): Admission: EM | Admit: 2007-12-10 | Discharge: 2007-12-10 | Payer: Self-pay | Admitting: Emergency Medicine

## 2008-04-24 ENCOUNTER — Emergency Department (HOSPITAL_COMMUNITY): Admission: EM | Admit: 2008-04-24 | Discharge: 2008-04-24 | Payer: Self-pay | Admitting: Emergency Medicine

## 2008-08-12 ENCOUNTER — Emergency Department (HOSPITAL_COMMUNITY): Admission: EM | Admit: 2008-08-12 | Discharge: 2008-08-12 | Payer: Self-pay | Admitting: Emergency Medicine

## 2008-10-02 ENCOUNTER — Inpatient Hospital Stay (HOSPITAL_COMMUNITY): Admission: EM | Admit: 2008-10-02 | Discharge: 2008-10-07 | Payer: Self-pay | Admitting: Emergency Medicine

## 2008-10-07 ENCOUNTER — Inpatient Hospital Stay (HOSPITAL_COMMUNITY): Admission: RE | Admit: 2008-10-07 | Discharge: 2008-10-11 | Payer: Self-pay | Admitting: *Deleted

## 2008-10-07 ENCOUNTER — Ambulatory Visit: Payer: Self-pay | Admitting: *Deleted

## 2008-10-25 IMAGING — CR DG CHEST 2V
2 series · 2 of 2 positions shown · non-contrast
Comparison: 10/14/04.

CLINICAL DATA: Cough.  Congestion.  Fever. 
 CHEST ? 2 VIEW:

[view not recorded (1 of 2)]
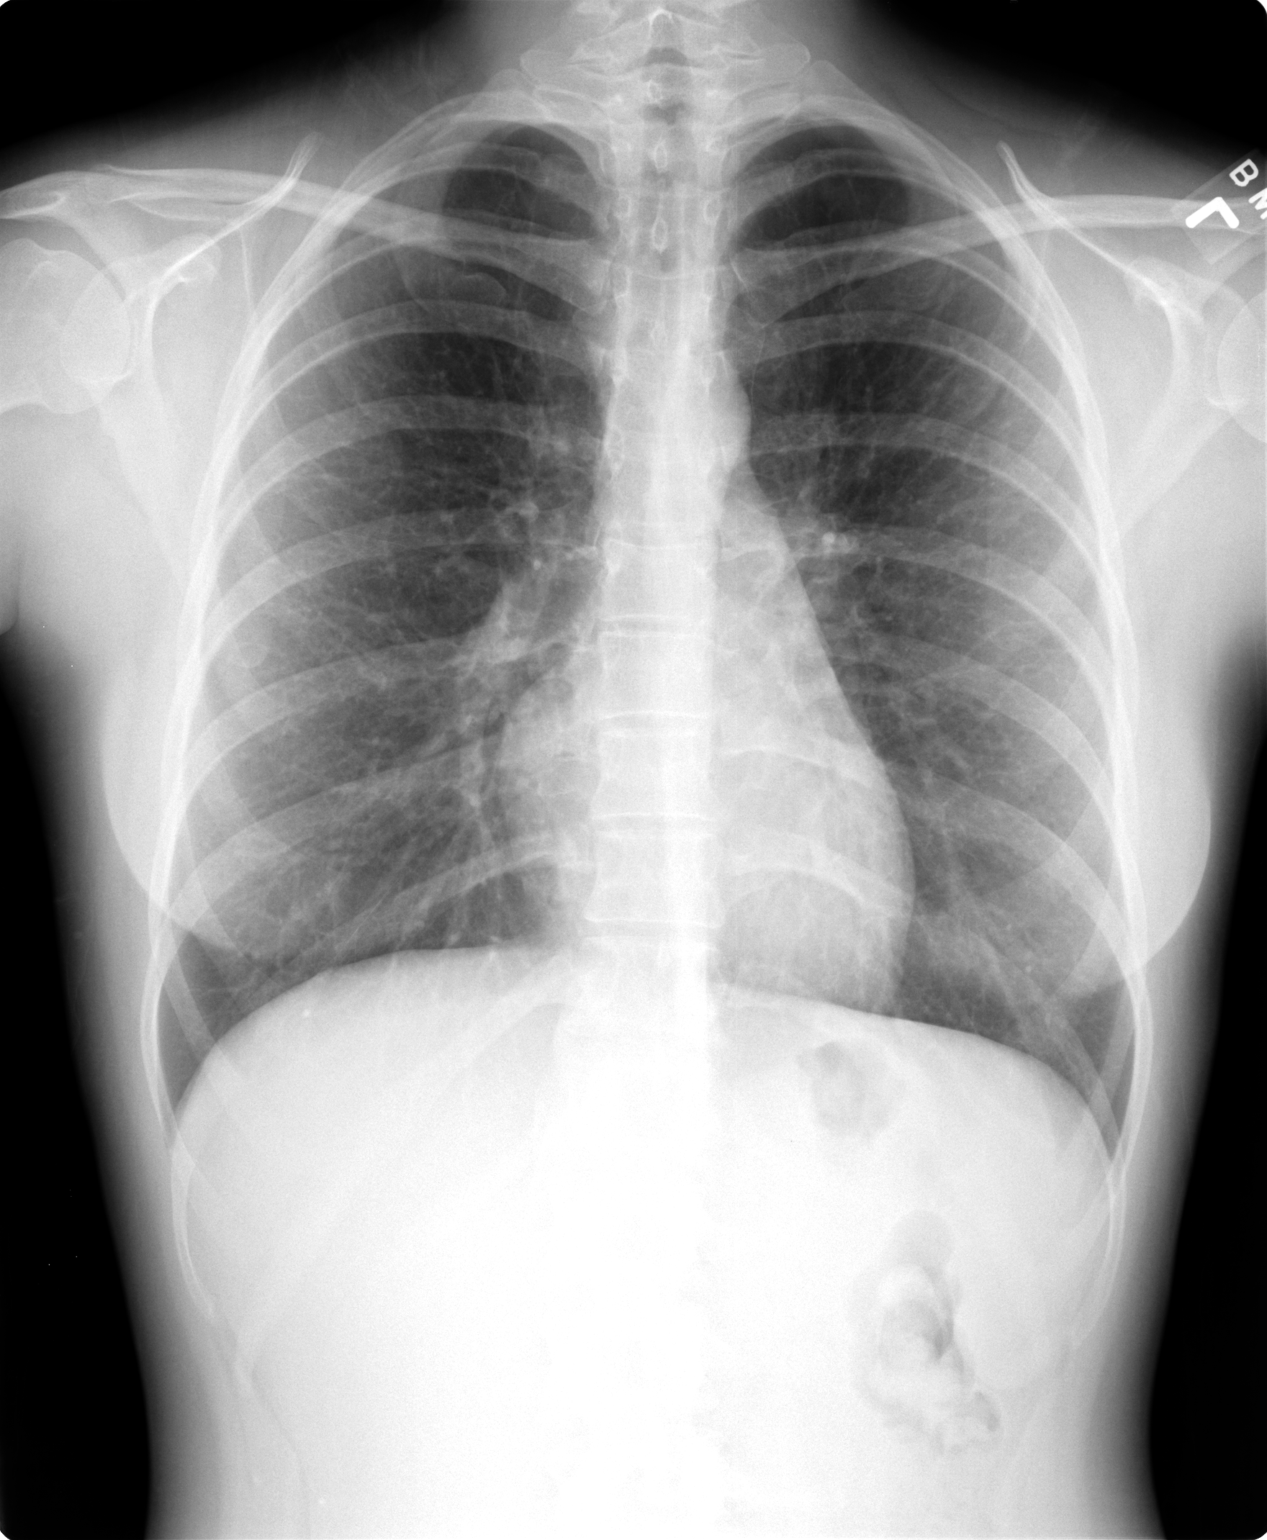

[view not recorded (2 of 2)]
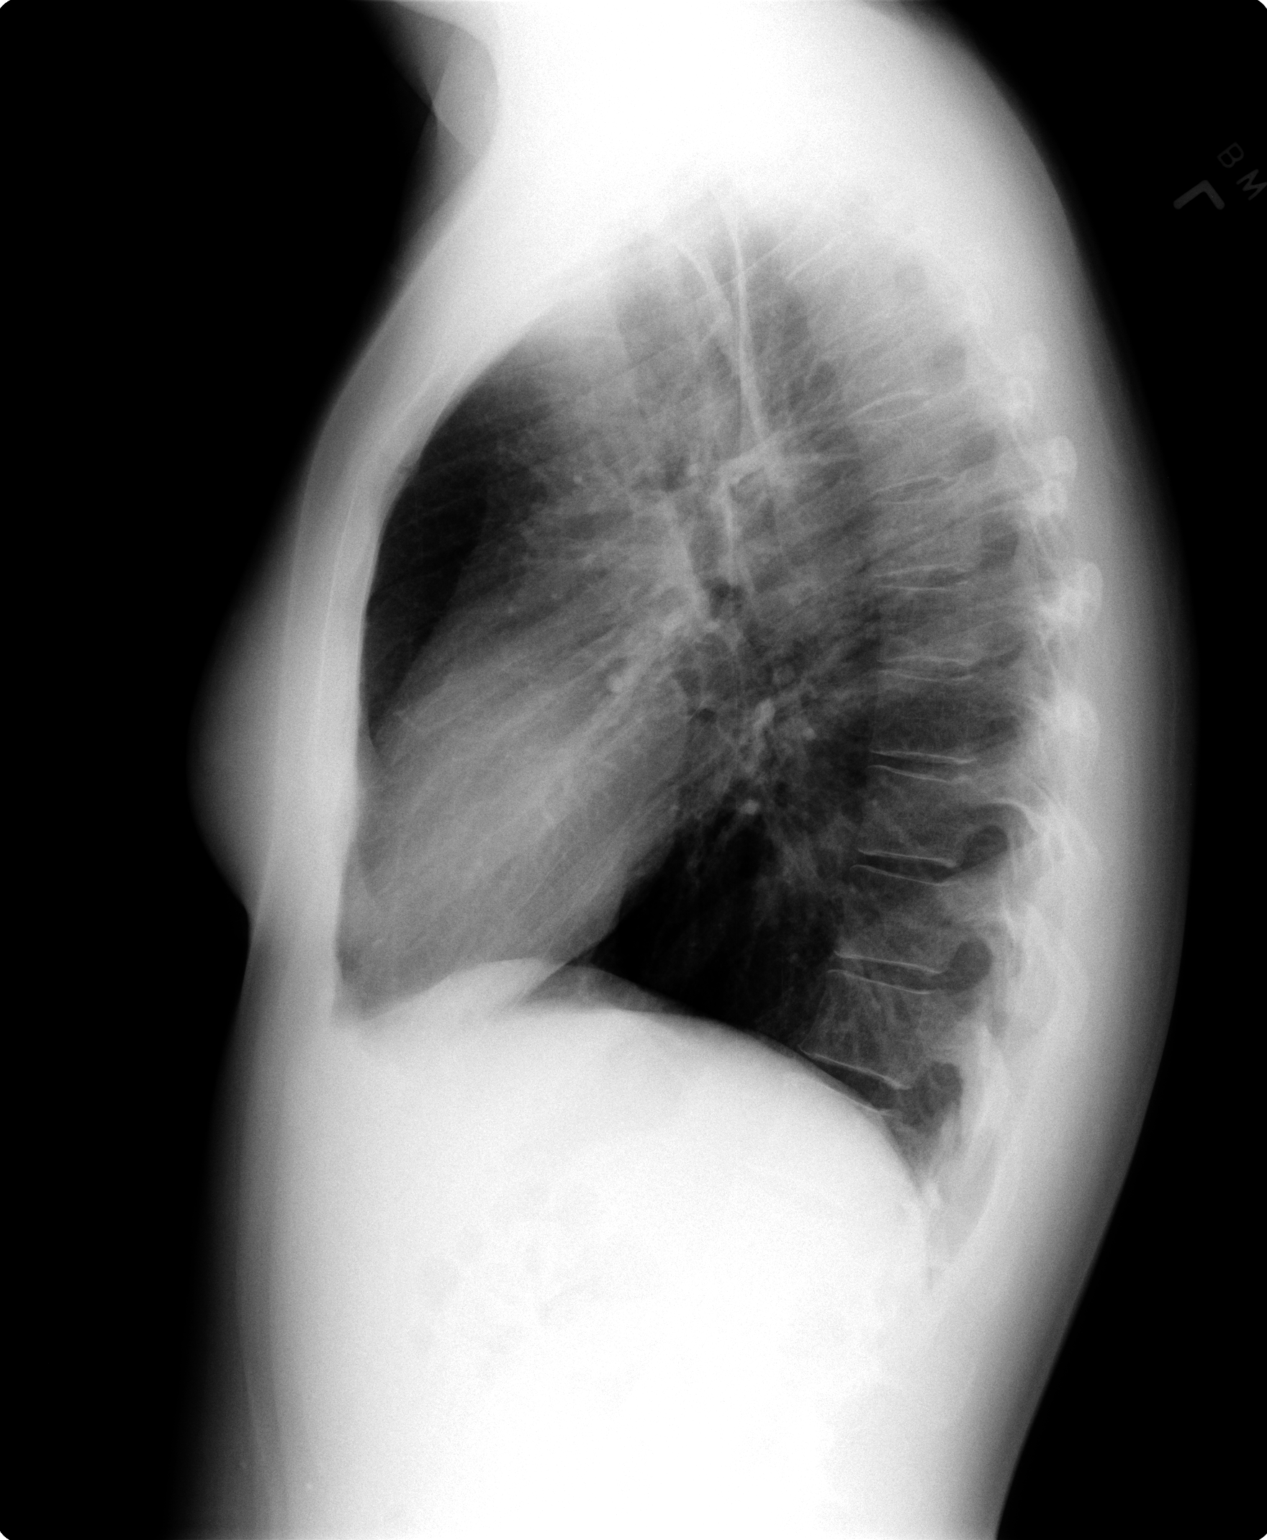

[2 of 2 positions shown; findings below may reference images not displayed]

FINDINGS: The trachea is midline.  The heart size is normal.  The lungs are clear.  No pleural fluid.
IMPRESSION: No acute findings.

## 2009-04-03 ENCOUNTER — Emergency Department (HOSPITAL_COMMUNITY): Admission: EM | Admit: 2009-04-03 | Discharge: 2009-04-03 | Payer: Self-pay | Admitting: Emergency Medicine

## 2009-04-07 ENCOUNTER — Other Ambulatory Visit: Payer: Self-pay

## 2009-04-07 ENCOUNTER — Ambulatory Visit: Payer: Self-pay | Admitting: Psychiatry

## 2009-04-07 ENCOUNTER — Other Ambulatory Visit: Payer: Self-pay | Admitting: Emergency Medicine

## 2009-04-07 ENCOUNTER — Inpatient Hospital Stay (HOSPITAL_COMMUNITY): Admission: AD | Admit: 2009-04-07 | Discharge: 2009-04-13 | Payer: Self-pay | Admitting: Psychiatry

## 2009-04-10 ENCOUNTER — Encounter: Payer: Self-pay | Admitting: Emergency Medicine

## 2009-07-22 IMAGING — CR DG CHEST 2V
2 series · 2 of 2 positions shown · non-contrast
Comparison: 03/06/06.

CLINICAL DATA: Chest pain and shortness of breath.
 CHEST - 2 VIEW:

[view not recorded (1 of 2)]
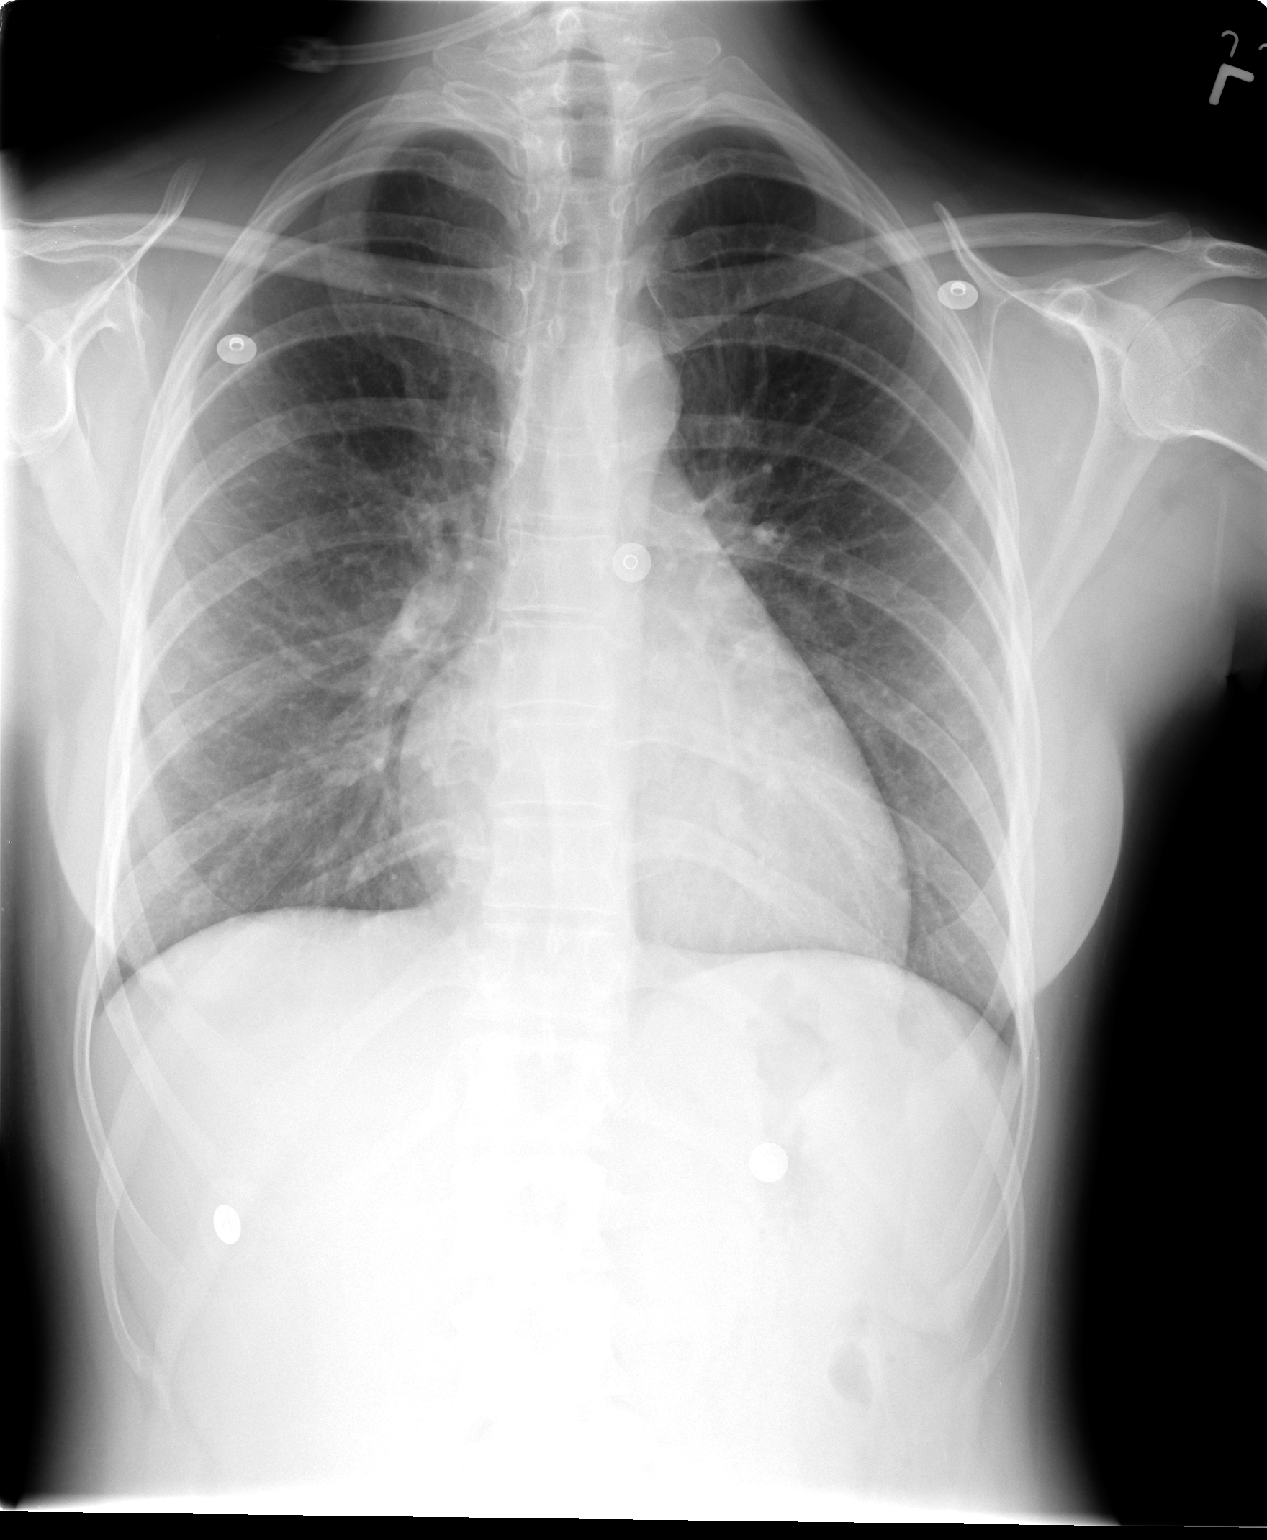

[view not recorded (2 of 2)]
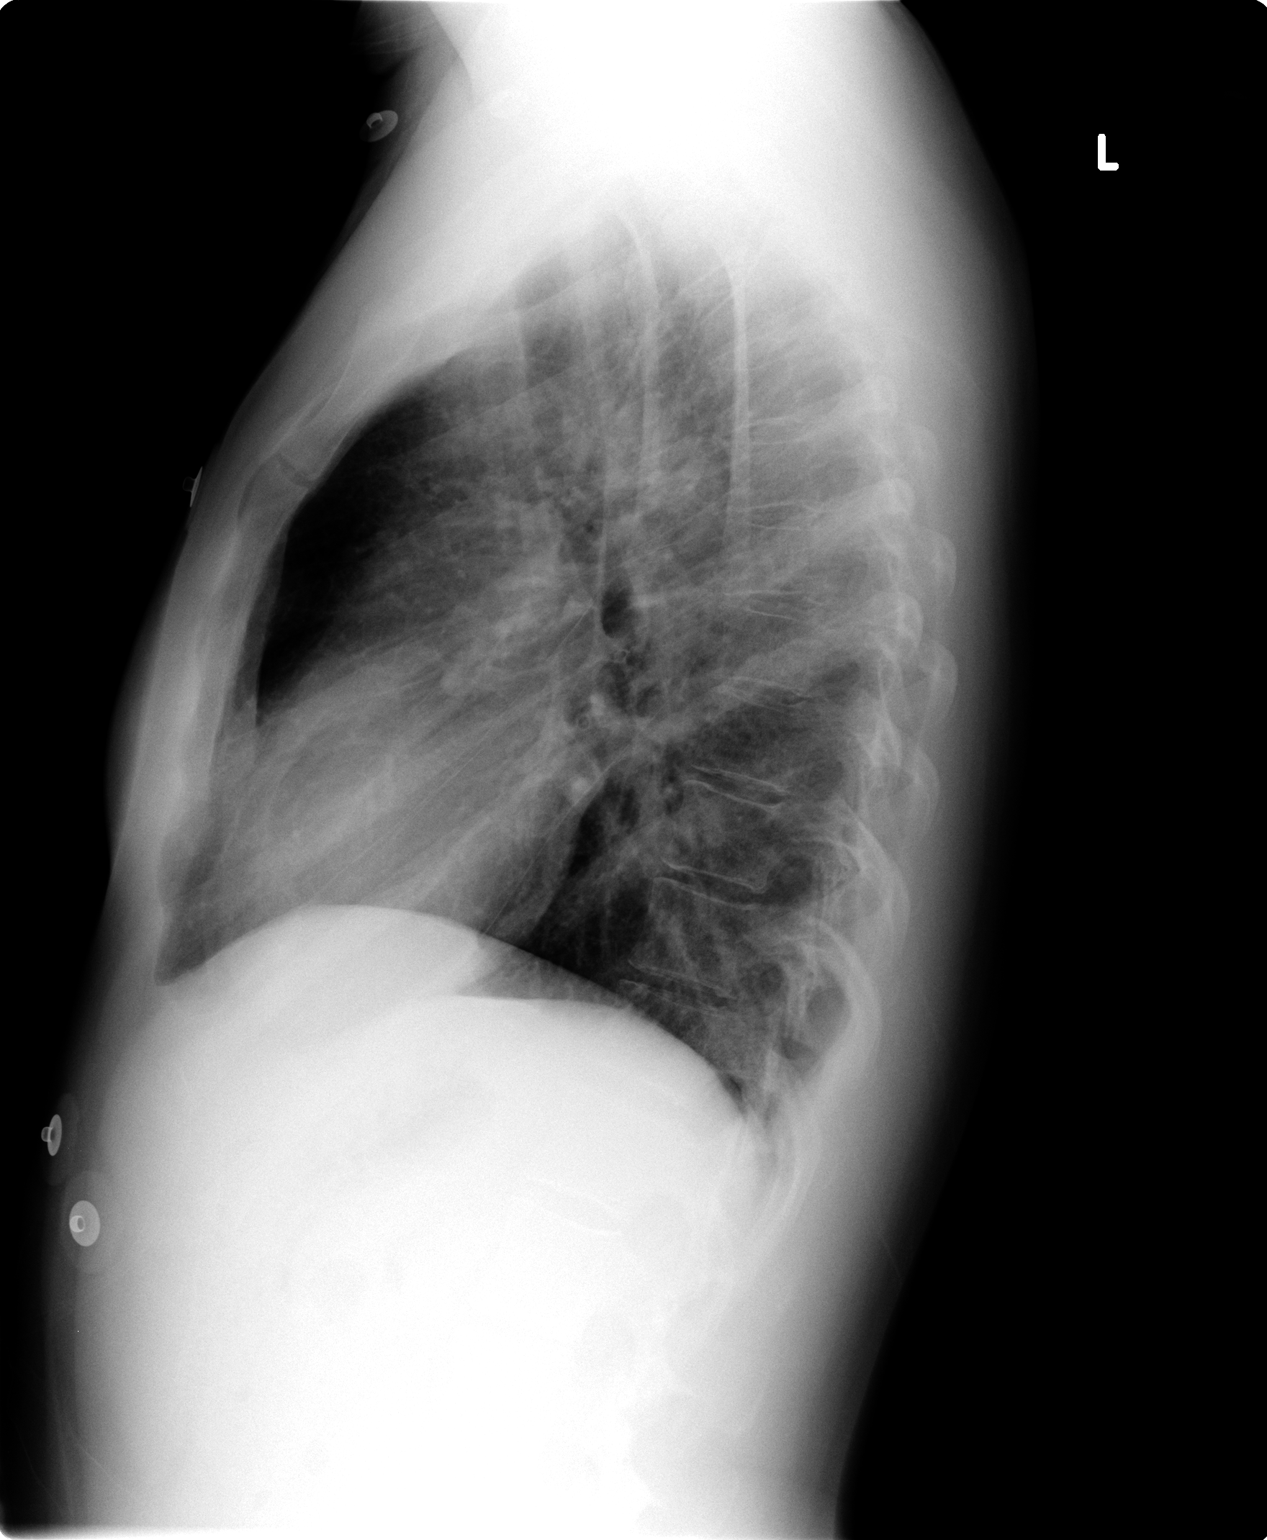

[2 of 2 positions shown; findings below may reference images not displayed]

FINDINGS: The trachea is midline. Heart size is accentuated by technique. The lungs are clear. No pleural fluid.
IMPRESSION: No acute findings.

## 2009-09-08 ENCOUNTER — Emergency Department (HOSPITAL_COMMUNITY): Admission: EM | Admit: 2009-09-08 | Discharge: 2009-09-08 | Payer: Self-pay | Admitting: Emergency Medicine

## 2009-09-13 ENCOUNTER — Emergency Department (HOSPITAL_COMMUNITY): Admission: EM | Admit: 2009-09-13 | Discharge: 2009-09-13 | Payer: Self-pay | Admitting: Emergency Medicine

## 2009-09-18 ENCOUNTER — Emergency Department (HOSPITAL_COMMUNITY): Admission: EM | Admit: 2009-09-18 | Discharge: 2009-09-18 | Payer: Self-pay | Admitting: Emergency Medicine

## 2009-10-19 ENCOUNTER — Emergency Department (HOSPITAL_COMMUNITY): Admission: EM | Admit: 2009-10-19 | Discharge: 2009-10-19 | Payer: Self-pay | Admitting: Emergency Medicine

## 2010-04-10 IMAGING — CR DG ABDOMEN ACUTE W/ 1V CHEST
4 series · 4 of 4 positions shown · non-contrast
Comparison: None

CLINICAL DATA: Normal variants and vomiting.  Dysuria.

ACUTE ABDOMEN SERIES (ABDOMEN 2 VIEW & CHEST 1 VIEW)

[w chest pa *]
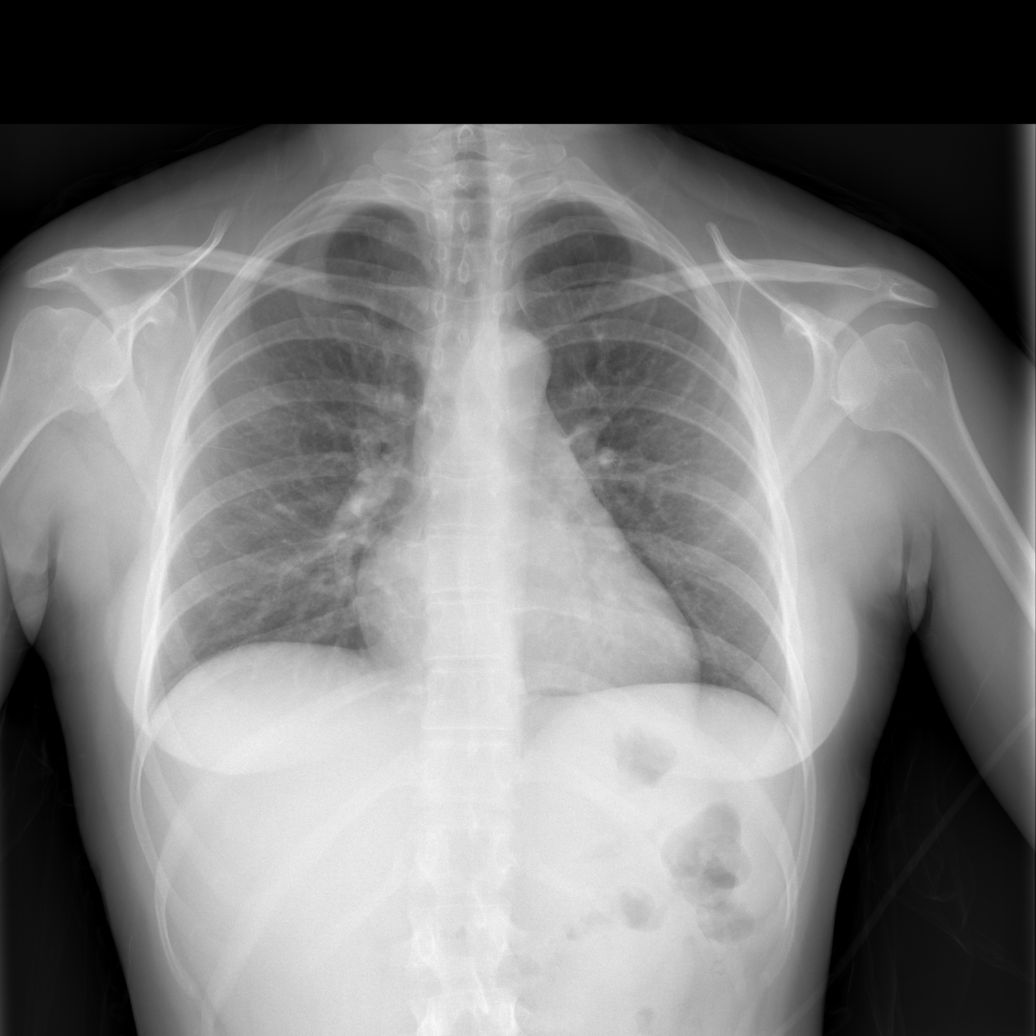

[w abdomen upright *]
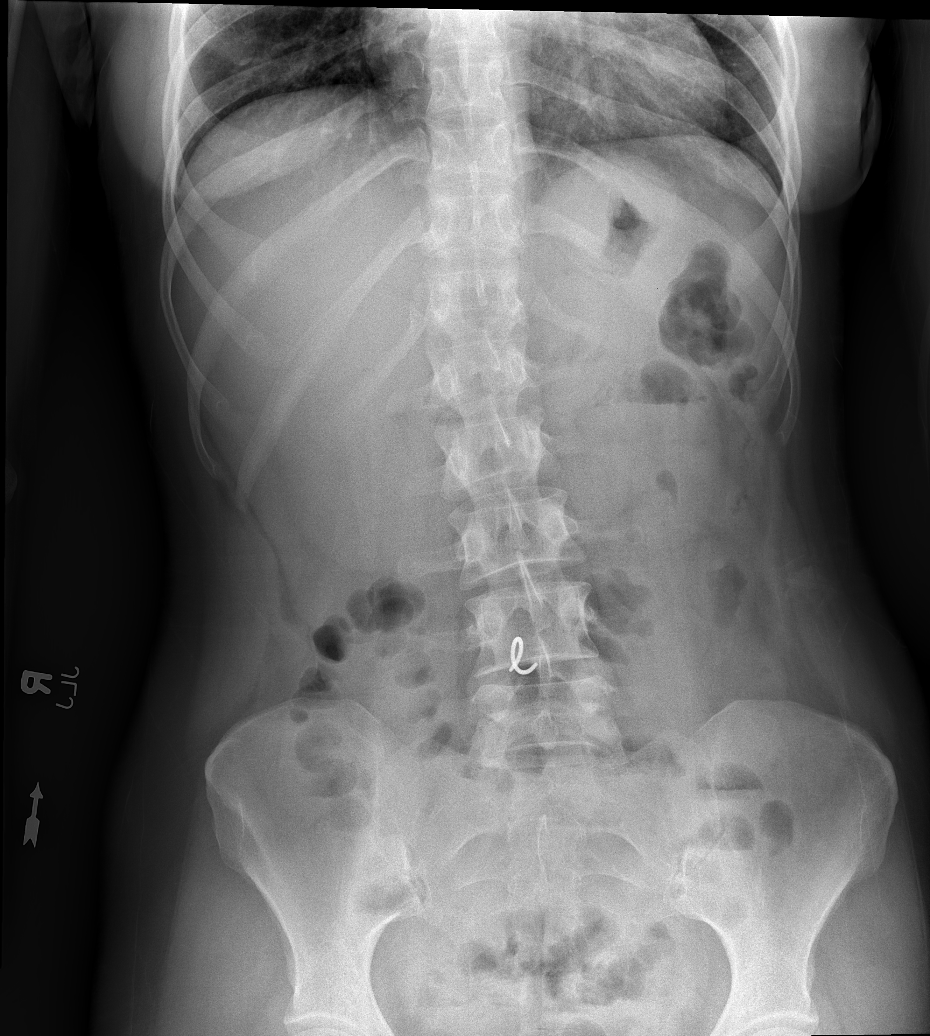

[t abdomen supine (1 of 2)]
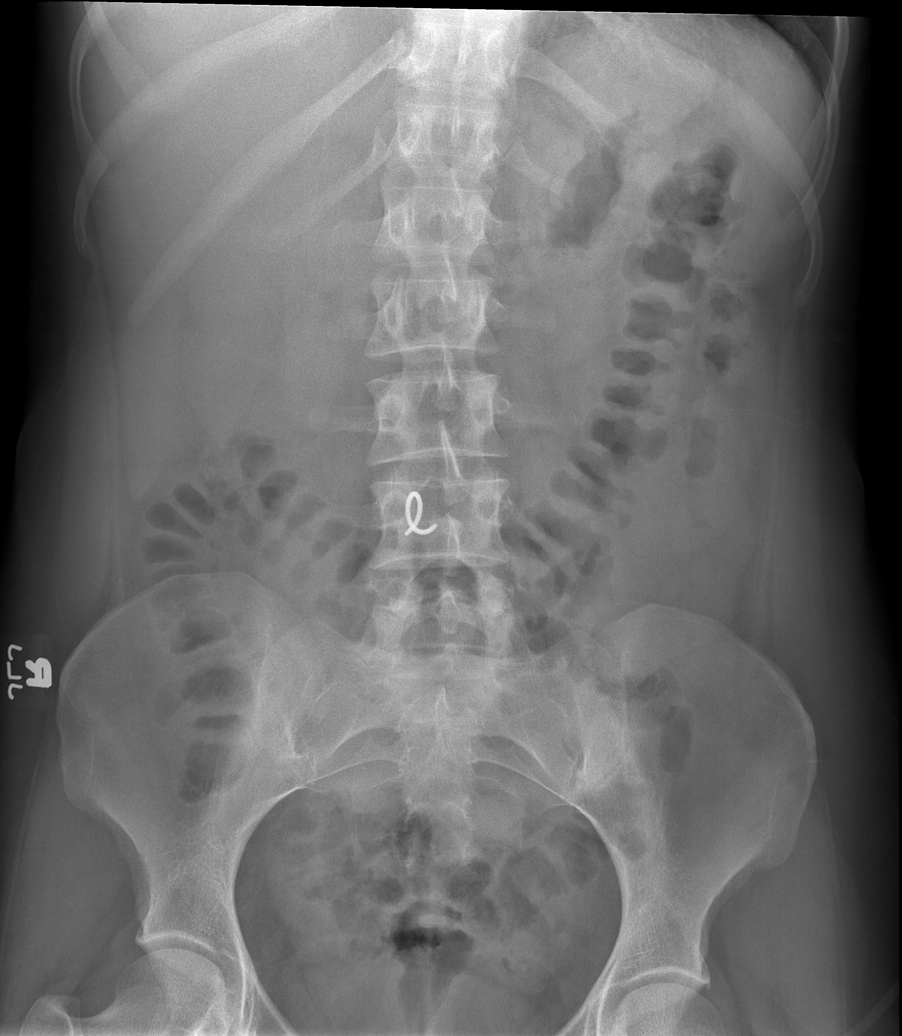

[t abdomen supine (2 of 2)]
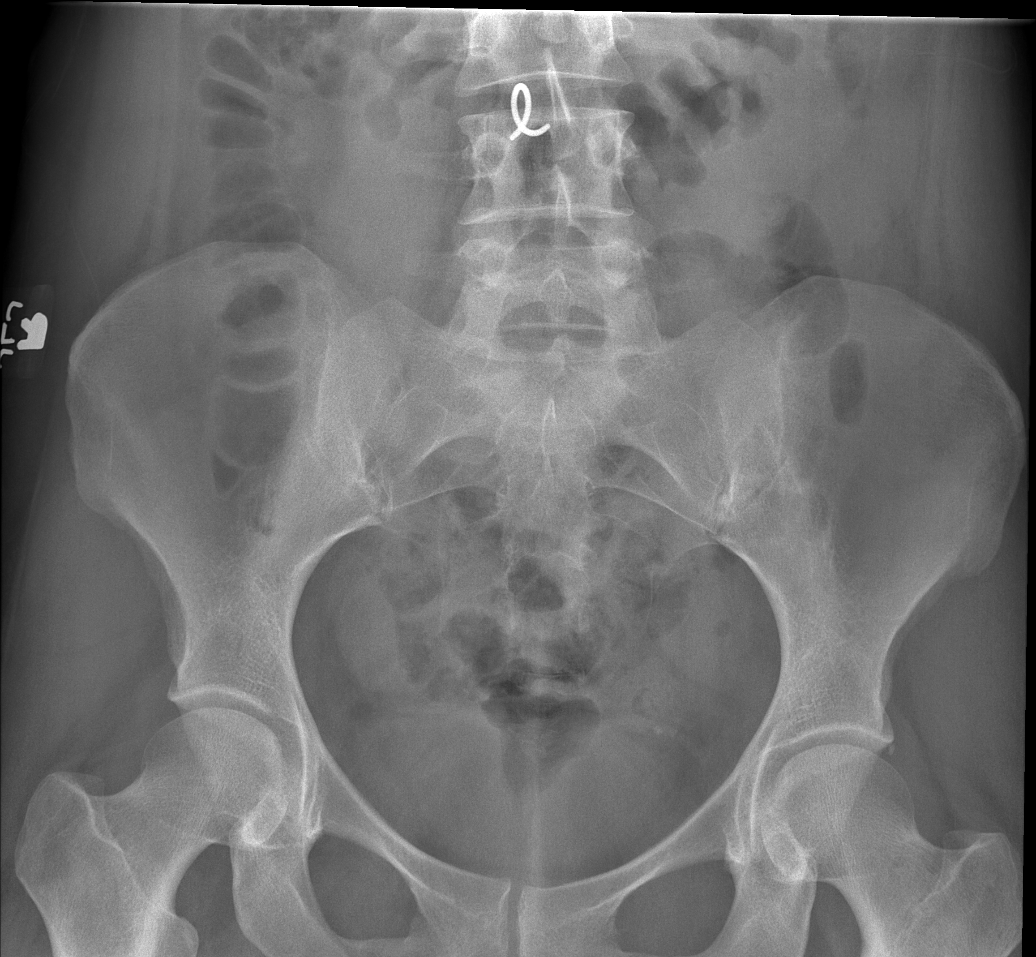

[4 of 4 positions shown; findings below may reference images not displayed]

FINDINGS: Normal chest.  Normal bowel gas pattern.  No unusual
calcification or suggestion of mass effect.  Properitoneal fat
stripes are defined
IMPRESSION: No acute chest or abdominal disease.

## 2010-05-12 ENCOUNTER — Emergency Department (HOSPITAL_COMMUNITY): Payer: Self-pay

## 2010-05-12 ENCOUNTER — Emergency Department (HOSPITAL_COMMUNITY)
Admission: EM | Admit: 2010-05-12 | Discharge: 2010-05-13 | Disposition: A | Discharge status: Home / Self Care | Payer: Self-pay | Attending: General Surgery | Admitting: General Surgery

## 2010-05-12 DIAGNOSIS — R05 Cough: Secondary | ICD-10-CM | POA: Insufficient documentation

## 2010-05-12 DIAGNOSIS — Z90710 Acquired absence of both cervix and uterus: Secondary | ICD-10-CM | POA: Insufficient documentation

## 2010-05-12 DIAGNOSIS — F329 Major depressive disorder, single episode, unspecified: Secondary | ICD-10-CM | POA: Insufficient documentation

## 2010-05-12 DIAGNOSIS — IMO0002 Reserved for concepts with insufficient information to code with codable children: Secondary | ICD-10-CM | POA: Insufficient documentation

## 2010-05-12 DIAGNOSIS — F1010 Alcohol abuse, uncomplicated: Secondary | ICD-10-CM | POA: Insufficient documentation

## 2010-05-12 DIAGNOSIS — K7689 Other specified diseases of liver: Secondary | ICD-10-CM | POA: Insufficient documentation

## 2010-05-12 DIAGNOSIS — M79609 Pain in unspecified limb: Secondary | ICD-10-CM | POA: Insufficient documentation

## 2010-05-12 DIAGNOSIS — F3289 Other specified depressive episodes: Secondary | ICD-10-CM | POA: Insufficient documentation

## 2010-05-12 DIAGNOSIS — R0789 Other chest pain: Secondary | ICD-10-CM | POA: Insufficient documentation

## 2010-05-12 DIAGNOSIS — S40029A Contusion of unspecified upper arm, initial encounter: Secondary | ICD-10-CM | POA: Insufficient documentation

## 2010-05-12 DIAGNOSIS — S0003XA Contusion of scalp, initial encounter: Secondary | ICD-10-CM | POA: Insufficient documentation

## 2010-05-12 DIAGNOSIS — R059 Cough, unspecified: Secondary | ICD-10-CM | POA: Insufficient documentation

## 2010-05-12 LAB — POCT I-STAT, CHEM 8
BUN: 9 mg/dL (ref 6–23)
Calcium, Ion: 1.11 mmol/L — ABNORMAL LOW (ref 1.12–1.32)
HCT: 45 % (ref 36.0–46.0)
Hemoglobin: 15.3 g/dL — ABNORMAL HIGH (ref 12.0–15.0)
Sodium: 139 meq/L (ref 135–145)
TCO2: 21 mmol/L (ref 0–100)

## 2010-05-12 LAB — URINALYSIS, ROUTINE W REFLEX MICROSCOPIC
Bilirubin Urine: NEGATIVE
Hgb urine dipstick: NEGATIVE
Protein, ur: NEGATIVE mg/dL
Urobilinogen, UA: 0.2 mg/dL (ref 0.0–1.0)

## 2010-05-12 LAB — TYPE AND SCREEN

## 2010-05-12 LAB — ABO/RH: ABO/RH(D): A POS

## 2010-05-12 LAB — ETHANOL: Alcohol, Ethyl (B): 240 mg/dL — ABNORMAL HIGH (ref 0–10)

## 2010-05-12 MED ORDER — IOHEXOL 300 MG/ML  SOLN
100.0000 mL | Freq: Once | INTRAMUSCULAR | Status: AC | PRN
  Administered 2010-05-12: 100 mL via INTRAVENOUS

## 2010-05-13 ENCOUNTER — Other Ambulatory Visit (HOSPITAL_COMMUNITY): Payer: Self-pay

## 2010-06-12 LAB — BASIC METABOLIC PANEL
BUN: 14 mg/dL (ref 6–23)
BUN: 15 mg/dL (ref 6–23)
CO2: 23 mEq/L (ref 19–32)
CO2: 24 mEq/L (ref 19–32)
Calcium: 9.1 mg/dL (ref 8.4–10.5)
Chloride: 102 mEq/L (ref 96–112)
Creatinine, Ser: 0.67 mg/dL (ref 0.4–1.2)
GFR calc non Af Amer: >60 mL/min (ref >60)
Glucose, Bld: 154 mg/dL — ABNORMAL HIGH (ref 70–99)
Glucose, Bld: 87 mg/dL (ref 70–99)
Potassium: 4.1 mEq/L (ref 3.5–5.1)

## 2010-06-12 LAB — HEPATIC FUNCTION PANEL
ALT: 12 U/L (ref 0–35)
Alkaline Phosphatase: 66 U/L (ref 39–117)
Bilirubin, Direct: 0.2 mg/dL (ref 0.0–0.3)
Indirect Bilirubin: 0.3 mg/dL (ref 0.3–0.9)
Total Bilirubin: 0.5 mg/dL (ref 0.3–1.2)

## 2010-06-12 LAB — RAPID URINE DRUG SCREEN, HOSP PERFORMED
Amphetamines: NOT DETECTED
Barbiturates: NOT DETECTED
Barbiturates: NOT DETECTED
Cocaine: NOT DETECTED
Opiates: NOT DETECTED
Opiates: NOT DETECTED
Tetrahydrocannabinol: NOT DETECTED

## 2010-06-12 LAB — COMPREHENSIVE METABOLIC PANEL
Albumin: 4.2 g/dL (ref 3.5–5.2)
Alkaline Phosphatase: 60 U/L (ref 39–117)
BUN: 13 mg/dL (ref 6–23)
CO2: 25 mEq/L (ref 19–32)
Chloride: 106 mEq/L (ref 96–112)
Creatinine, Ser: 0.65 mg/dL (ref 0.4–1.2)
GFR calc non Af Amer: >60 mL/min (ref >60)
Potassium: 4.2 mEq/L (ref 3.5–5.1)
Total Bilirubin: 0.1 mg/dL — ABNORMAL LOW (ref 0.3–1.2)

## 2010-06-12 LAB — DIFFERENTIAL
Basophils Absolute: 0 10*3/uL (ref 0.0–0.1)
Basophils Absolute: 0 10*3/uL (ref 0.0–0.1)
Basophils Relative: 0 % (ref 0–1)
Basophils Relative: 0 % (ref 0–1)
Basophils Relative: 0 % (ref 0–1)
Eosinophils Absolute: 0.1 10*3/uL (ref 0.0–0.7)
Eosinophils Absolute: 0.1 10*3/uL (ref 0.0–0.7)
Eosinophils Relative: 1 % (ref 0–5)
Eosinophils Relative: 3 % (ref 0–5)
Lymphocytes Relative: 17 % (ref 12–46)
Lymphocytes Relative: 25 % (ref 12–46)
Monocytes Absolute: 0.3 10*3/uL (ref 0.1–1.0)
Monocytes Absolute: 0.5 10*3/uL (ref 0.1–1.0)
Neutro Abs: 3.1 10*3/uL (ref 1.7–7.7)
Neutrophils Relative %: 56 % (ref 43–77)

## 2010-06-12 LAB — CBC
HCT: 38 % (ref 36.0–46.0)
HCT: 40.7 % (ref 36.0–46.0)
Hemoglobin: 12.9 g/dL (ref 12.0–15.0)
MCHC: 34.4 g/dL (ref 30.0–36.0)
MCHC: 34.6 g/dL (ref 30.0–36.0)
MCV: 96.3 fL (ref 78.0–100.0)
MCV: 96.6 fL (ref 78.0–100.0)
Platelets: 202 10*3/uL (ref 150–400)
Platelets: 211 10*3/uL (ref 150–400)
Platelets: 228 10*3/uL (ref 150–400)
RBC: 3.93 MIL/uL (ref 3.87–5.11)
RDW: 13.9 % (ref 11.5–15.5)
RDW: 14.2 % (ref 11.5–15.5)
WBC: 5.1 10*3/uL (ref 4.0–10.5)

## 2010-06-12 LAB — URINALYSIS, ROUTINE W REFLEX MICROSCOPIC
Bilirubin Urine: NEGATIVE
Bilirubin Urine: NEGATIVE
Glucose, UA: NEGATIVE mg/dL
Hgb urine dipstick: NEGATIVE
Ketones, ur: NEGATIVE mg/dL
Ketones, ur: NEGATIVE mg/dL
Nitrite: NEGATIVE
Protein, ur: NEGATIVE mg/dL
Protein, ur: NEGATIVE mg/dL
Urobilinogen, UA: 0.2 mg/dL (ref 0.0–1.0)
Urobilinogen, UA: 0.2 mg/dL (ref 0.0–1.0)
pH: 7 (ref 5.0–8.0)

## 2010-06-12 LAB — POCT PREGNANCY, URINE: Preg Test, Ur: NEGATIVE

## 2010-06-12 LAB — ETHANOL: Alcohol, Ethyl (B): <5 mg/dL (ref 0–10)

## 2010-06-12 LAB — RPR: RPR Ser Ql: NONREACTIVE

## 2010-06-12 LAB — LIPASE, BLOOD: Lipase: 32 U/L (ref 11–59)

## 2010-06-14 ENCOUNTER — Emergency Department (HOSPITAL_COMMUNITY)
Admission: EM | Admit: 2010-06-14 | Discharge: 2010-06-14 | Disposition: A | Discharge status: Home / Self Care | Payer: Self-pay | Attending: Emergency Medicine | Admitting: Emergency Medicine

## 2010-06-14 DIAGNOSIS — F341 Dysthymic disorder: Secondary | ICD-10-CM | POA: Insufficient documentation

## 2010-06-14 DIAGNOSIS — T7840XA Allergy, unspecified, initial encounter: Secondary | ICD-10-CM | POA: Insufficient documentation

## 2010-06-14 DIAGNOSIS — R220 Localized swelling, mass and lump, head: Secondary | ICD-10-CM | POA: Insufficient documentation

## 2010-06-14 DIAGNOSIS — X58XXXA Exposure to other specified factors, initial encounter: Secondary | ICD-10-CM | POA: Insufficient documentation

## 2010-06-14 LAB — COMPREHENSIVE METABOLIC PANEL
Albumin: 4 g/dL (ref 3.5–5.2)
Alkaline Phosphatase: 83 U/L (ref 39–117)
BUN: 11 mg/dL (ref 6–23)
CO2: 23 mEq/L (ref 19–32)
Chloride: 106 mEq/L (ref 96–112)
Creatinine, Ser: 0.61 mg/dL (ref 0.4–1.2)
GFR calc non Af Amer: >60 mL/min (ref >60)
Glucose, Bld: 96 mg/dL (ref 70–99)
Potassium: 4.1 mEq/L (ref 3.5–5.1)
Total Bilirubin: 0.8 mg/dL (ref 0.3–1.2)

## 2010-06-14 LAB — CBC
HCT: 43 % (ref 36.0–46.0)
Hemoglobin: 14.4 g/dL (ref 12.0–15.0)
MCV: 94.5 fL (ref 78.0–100.0)
Platelets: 207 10*3/uL (ref 150–400)
WBC: 5.9 10*3/uL (ref 4.0–10.5)

## 2010-06-14 LAB — LIPASE, BLOOD: Lipase: 41 U/L (ref 11–59)

## 2010-06-14 LAB — DIFFERENTIAL
Basophils Absolute: 0 10*3/uL (ref 0.0–0.1)
Basophils Relative: 0 % (ref 0–1)
Lymphocytes Relative: 28 % (ref 12–46)
Neutro Abs: 3.6 10*3/uL (ref 1.7–7.7)
Neutrophils Relative %: 61 % (ref 43–77)

## 2010-06-14 LAB — URINALYSIS, ROUTINE W REFLEX MICROSCOPIC
Glucose, UA: NEGATIVE mg/dL
Hgb urine dipstick: NEGATIVE
Protein, ur: NEGATIVE mg/dL
pH: 5.5 (ref 5.0–8.0)

## 2010-07-04 LAB — URINALYSIS, ROUTINE W REFLEX MICROSCOPIC
Ketones, ur: 15 mg/dL — AB
Nitrite: NEGATIVE
Protein, ur: 30 mg/dL — AB
Specific Gravity, Urine: 1.026 (ref 1.005–1.030)
Urobilinogen, UA: 0.2 mg/dL (ref 0.0–1.0)

## 2010-07-04 LAB — DIFFERENTIAL
Basophils Absolute: 0.1 10*3/uL (ref 0.0–0.1)
Eosinophils Relative: 4 % (ref 0–5)
Lymphocytes Relative: 32 % (ref 12–46)
Monocytes Absolute: 0.5 10*3/uL (ref 0.1–1.0)

## 2010-07-04 LAB — BASIC METABOLIC PANEL
CO2: 20 mEq/L (ref 19–32)
Chloride: 107 mEq/L (ref 96–112)
GFR calc non Af Amer: >60 mL/min (ref >60)
Glucose, Bld: 82 mg/dL (ref 70–99)
Potassium: 4 mEq/L (ref 3.5–5.1)
Sodium: 142 mEq/L (ref 135–145)

## 2010-07-04 LAB — HEPATIC FUNCTION PANEL
ALT: 110 U/L — ABNORMAL HIGH (ref 0–35)
AST: 81 U/L — ABNORMAL HIGH (ref 0–37)
Alkaline Phosphatase: 79 U/L (ref 39–117)
Bilirubin, Direct: 0.1 mg/dL (ref 0.0–0.3)
Total Bilirubin: 0.6 mg/dL (ref 0.3–1.2)

## 2010-07-04 LAB — RAPID URINE DRUG SCREEN, HOSP PERFORMED
Amphetamines: NOT DETECTED
Benzodiazepines: POSITIVE — AB

## 2010-07-04 LAB — TRICYCLICS SCREEN, URINE: TCA Scrn: NOT DETECTED

## 2010-07-04 LAB — CBC
HCT: 41.8 % (ref 36.0–46.0)
Hemoglobin: 14.2 g/dL (ref 12.0–15.0)
RDW: 13.7 % (ref 11.5–15.5)

## 2010-07-04 LAB — POCT PREGNANCY, URINE: Preg Test, Ur: NEGATIVE
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
## 2010-08-10 NOTE — H&P (Signed)
[name], [name]          ACCOUNT NO.:  [id number]   MEDICAL RECORD NO.:  [id number]          PATIENT TYPE:  EMS   LOCATION:  ED                           FACILITY:  [facility]   PHYSICIAN:  Ruthy Dick, MD    DATE OF BIRTH:  1975-05-02   DATE OF ADMISSION:  10/02/2008  DATE OF DISCHARGE:                              HISTORY & PHYSICAL   The patient was seen and examined in the emergency room.   CHIEF COMPLAINT:  Anxiety and suicidal ideation and plan.   HISTORY OF PRESENT ILLNESS:  [name] is a 35 year old Caucasian  lady with a past medical history according to her of manic depressive  bipolar disorder and schizophrenia and possibly obsessive compulsive  disorder who is an alcohol abuser and came to the hospital because  according to her she was seeking help and she wanted to be admitted to  behavioral health.  She mentioned the fact that if she did not get any  help she was going to blow her brains out and arrangements was being  made by the ER physician to send the patient to behavioral health.  However, prior to the patient going to behavioral health she started  hallucinating and being very agitated and delirium tremens was  diagnosed.  Concerned that the patient's heart rate and blood pressure  went up.  By the time I saw her she had been started on a banana bag and  Ativan was given to her.  By the time I saw her she was less agitated  and was able to answer questions.  She denies chest pain, shortness of  breath, abdominal pain, nausea, vomiting, diarrhea, constipation and  says that she is feeling a little better but really needs help and kept  asking when she would be transferred to behavioral health.  She says  that she is very stressed out at home with four kids and she has fear of  going outside in the public.  No dysuria, no frequency, no urgency, no  cough, no palpitations, no syncope, no seizures.   PAST MEDICAL HISTORY:  1. Bipolar disorder (manic  depressive).  2. Persistent alcohol abuse.  3. Obsessive compulsive disorder according to her.  4. Anxiety disorder.  5. Asthma and emphysema.   SOCIAL HISTORY:  She says she lives at home with four kids and at this  time she states not able to take care of the kids.  Says she smokes  about 1-1/2 packs of cigarettes per day and drinks about a bottle of 750  mL of vodka every single day.  She says she has been drinking  consistently like this for the last 2 months and denies illicit drug use  whatsoever but says she has smoked marijuana in the past 2 weeks ago.  Denies illicit drug use except for marijuana which she smoked about 2  weeks ago.   MEDICATIONS:  None.   ALLERGIES:  She says she is allergic to SULFA which causes angioedema  and shortness of breath.   FAMILY HISTORY:  Father died of cirrhosis and liver cancer.   REVIEW OF SYSTEM:  A  12 point review of systems was done and was  negative except as in history of present illness.   PHYSICAL EXAMINATION:  GENERAL:  The patient was seen and examined in  the emergency room.  She is oriented to time, place and person.  She is  a little bit agitated and tremulous.  Also fidgety.  VITAL SIGNS:  Temperature 98.0, pulse 98, respirations 18, blood  pressure 149/106, saturating 97% on room air.  HEENT:  Normocephalic, atraumatic.  Pupils equal, round, reactive to  light.  Extraocular muscles intact.  Nares patent.  NECK:  Supple.  No JVD.  No lymphadenopathy.  No thyromegaly.  ABDOMEN:  Soft, nontender.  No hepatosplenomegaly.  EXTREMITIES:  No clubbing or cyanosis.  No edema.  CARDIOVASCULAR:  First and second heart sounds normal.  No third heart  sounds.  No murmurs, no rubs, no gallops.  CENTRAL NERVOUS SYSTEM:  Nonfocal at this time.  Cranial nerves II-XII.  The patient seemed to have a slight flapping tremor.   LABS AND INVESTIGATIONS:  Alcohol level is 96.  Urine alcohol level is  96 with normal being between zero and 10.   Urine drug screen shows  positive for benzodiazepines, not quite sure whether this is from her  being given Ativan.  Pregnancy test is negative.  Otherwise CBC with  differential and BMP within normal limits.  Chest x-ray shows no acute  cardiopulmonary abnormalities.   ASSESSMENT:  1. Delirium tremens.  2. Suicidality and plan.  3. Bipolar disorder.  4. Schizophrenia per the patient's history.  5. Anxiety disorder.  6. Alcohol abuse.  7. Tobacco abuse.   PLAN OF CARE:  Will admit the patient to step-down unit.  Start her on  Librium protocol.  Will also have her on banana bag and have one on one  observation because of suicidal ideation and will get her on DVT  prophylaxis and we will consult psychiatry to be part of the management  team.  The patient eventually will be transferred to the psych team when  we are able to control her agitation and tremulousness.      Ruthy Dick, MD  Electronically Signed     GU/MEDQ  D:  10/02/2008  T:  10/02/2008  Job:  [id number]

## 2010-08-10 NOTE — H&P (Signed)
NAMEMarland Kitchen  [name], [name] NO.:  [id number]   MEDICAL RECORD NO.:  [id number]          PATIENT TYPE:  IPS   LOCATION:  0601                          [facility]ACILITY:  [facility]   PHYSICIAN:  Anselm Jungling, MD  DATE OF BIRTH:  1976-02-23   DATE OF ADMISSION:  08/22/2006  DATE OF DISCHARGE:                       PSYCHIATRIC ADMISSION ASSESSMENT   IDENTIFYING INFORMATION:  This is a 35 year old single female  voluntarily admitted on Aug 23, 2006.   HISTORY OF PRESENT ILLNESS:  The patient presented with a history of  depression, substance abuse and she was having plans to shoot herself.  She has been using cocaine, occasional Xanax, marijuana and opiates,  using OxyContin daily for approximately a year.  States she has lost 40  pounds in one month.  She has been having trouble sleeping.  She wants  help to feel better and be more stable.  She denies any hallucinations.  She reports a history of panic attacks.  Her stressors are abuse per her  spouse.   PAST PSYCHIATRIC HISTORY:  First admission to the [facility].  Was an outpatient at mental health.  Has been on Lexapro in the  past.   SOCIAL HISTORY:  This is a 35 year old single female, has four children,  unemployed.   FAMILY HISTORY:  Unknown.   ALCOHOL/DRUG HISTORY:  The patient smokes.  Alcohol and drug habits as  described above.  No apparent IV drug use.   PRIMARY CARE PHYSICIAN:  Unclear.   MEDICAL PROBLEMS:  History of asthma.   MEDICATIONS:  None.   ALLERGIES:  SULFA.   PHYSICAL EXAMINATION:  The patient was assessed at [facility].  She is a thin, disheveled female, does not appear in any  distress, somewhat restless.  Her temperature is 97.9, heart rate 95,  respirations 18, blood pressure 109/57, 98% saturated.  The patient  received 2 mg of Ativan in the emergency room.   LABORATORY DATA:  Her alcohol level less than 5.  Pregnancy test is  negative.  Potassium  is 3.  Urinalysis is negative.  Urine drug screen  is positive for opiates, positive for cocaine, positive for  benzodiazepines.   MENTAL STATUS EXAM:  She is a very sleepy female, somewhat restless,  kicking up her legs as she is talking, rocking somewhat back and forth.  Speech is clear.  The patient's mood is depressed.  The patient's affect  is some mild irritability.  Thought processes are coherent.  Cognitive  function is intact.  Memory is fair.  Judgment and insight are fair.  Somewhat of a poor historian.  Did not provide many details as to  stressors and admission.   DIAGNOSES:  AXIS I:  Depressive disorder not otherwise specified.  Polysubstance abuse.  AXIS II:  Deferred.  AXIS III:  Asthma.  AXIS IV:  Problems with primary support group, psychosocial problems,  medical problems.  AXIS V:  Current 35.   PLAN:  Contract for safety.  Stabilize her mood and thinking.  We will  detox the patient with the Librium and the clonidine protocol  which was  discussed with the patient.  Will discontinue her Xanax.  Will work on  relapse prevention.  We will discuss antidepressants and mood  stabilizers during her hospitalization.  Reinforce medications.  Consider a family session with her husband and will attempt to gain more  background information.  Casemanager will assess her living situation.   TENTATIVE LENGTH OF STAY:  Four to five days.      Landry Corporal, N.P.      Anselm Jungling, MD  Electronically Signed    JO/MEDQ  D:  08/22/2006  T:  08/22/2006  Job:  [id number]

## 2010-08-10 NOTE — Consult Note (Signed)
NAMESERENE, KOPF          ACCOUNT NO.:  [id number]   MEDICAL RECORD NO.:  [id number]          PATIENT TYPE:  INP   LOCATION:  1223                         FACILITY:  [facility]   PHYSICIAN:  Anselm Jungling, MD  DATE OF BIRTH:  January 31, 1976   DATE OF CONSULTATION:  10/03/2008  DATE OF DISCHARGE:                                 CONSULTATION   IDENTIFYING DATA/REASON FOR ADMISSION:  This is a 35 year old divorced  Caucasian female, mother of 4, currently hospitalized here at [facility] due to problems related to alcohol dependence, withdrawal,  severe depression and anxiety.  Psychiatric consultation is requested to  assess mental status and make recommendations.   HISTORY OF THE PRESENTING PROBLEMS:  The patient is familiar to me  having been under my care at our inpatient psychiatric service in June  2008, approximately 2 years ago.  She has a long history of major  depressive disorder, recurrent, associated with generalized anxiety  symptoms, and all in association with chronic polysubstance abuse.  Most  recently, she has been drinking heavily which she admits to.  She denies  any other substance abuse.  She acknowledges a need to stop drinking.  At this point, she is medically cleared and is on a waiting list for a  bed at our inpatient psychiatric facility.   MENTAL STATUS/OBSERVATIONS:  The patient is a well-nourished, normally-  developed adult female who is very pleasant and expressed gratitude at  being able to see me.  She is depressed, anxious, tearful.  She  indicates that she feels hopeless, helpless and acknowledges suicidal  thoughts.  She states that she feels like she has nothing to live for.  However, she does verbalize a desire for help.  She is absent any signs  of psychosis or thought disorder.  Her level of insight is fairly good.   IMPRESSION:  AXIS I:  Major depressive disorder, recurrent alcohol  dependence, early remission, alcohol  withdrawal.  AXIS II:  Deferred.  AXIS III:  See medical notes.  AXIS IV:  Stressors severe.  AXIS V:  Global assessment of functioning 35.   RECOMMENDATIONS:  I feel she is appropriate for transfer to [facility]  Inpatient Psychiatry when she is medically clear.  In the meantime, we  will provide Seroquel 25 mg p.o. q.i.d. to address her anxiety symptoms.  We will do this only for now with anticipation that we will do more  comprehensive review of her medication needs once she is transferred to  inpatient psychiatry.   Thank you for referring this patient to [place].      Anselm Jungling, MD  Electronically Signed    SPB/MEDQ  D:  10/03/2008  T:  10/03/2008  Job:  [id number]

## 2010-08-10 NOTE — Discharge Summary (Signed)
[name], [name]          ACCOUNT NO.:  [id number]   MEDICAL RECORD NO.:  [id number]          PATIENT TYPE:  INP   LOCATION:  1515                         FACILITY:  [facility]   PHYSICIAN:  Zannie Cove, MD     DATE OF BIRTH:  1975-12-03   DATE OF ADMISSION:  10/02/2008  DATE OF DISCHARGE:                               DISCHARGE SUMMARY   DISCHARGE DIAGNOSES:  1. Alcohol withdrawal.  2. Suicidal ideations.  3. Bipolar disorder.  4  Obsessive-compulsive disorder.  1. Alcohol abuse.   DISCHARGE MEDICATIONS:  1. Seroquel 50 mg p.o. q.i.d.  2. Thiamine 100 mg p.o. daily.  3. Multivitamin 1 tab p.o. daily.   HOSPITAL COURSE:  For admitting details, please see the note dictated by  Dr. Ruthy Dick on October 02, 2008.  Briefly, [name] is a 35-  year-old Caucasian female with a significant psychiatric history who was  admitted to the hospital secondary to suicidal ideation and was found to  be in alcohol withdrawal as well.  1. For alcohol withdrawal, she was treated with Librium protocol,      tapering doses.  She remained stable for 48 hours.  Started on      fluids, which is now discontinued.  In addition, thiamine and      multivitamins as well were administered.  2. Depression and suicidal ideation:  She was monitored with a Materials engineer.  Dr. Electa Sniff from psychiatry saw her and recommended      Seroquel 25 mg p.o. q.i.d., which was started and increased to 50mg       p.o. q.i.d.,  In addition, she was watched with suicide precautions      and had a 1-to-1 sitter with plans to transferred to [facility].   DISCHARGE DIET:  Regular.   DISCHARGE CONDITION:  Stable with suicide precautions to [facility].  Medically stable for transfer to [facility].      Zannie Cove, MD  Electronically Signed     PJ/MEDQ  D:  10/04/2008  T:  10/04/2008  Job:  [id number]

## 2010-08-10 NOTE — Discharge Summary (Signed)
NAMEHEELA, HEISHMAN          ACCOUNT NO.:  [id number]   MEDICAL RECORD NO.:  [id number]          PATIENT TYPE:  IPS   LOCATION:  0307                          [facility]ILITY:  [facility]   PHYSICIAN:  Jasmine Pang, M.D. DATE OF BIRTH:  06-05-1975   DATE OF ADMISSION:  10/07/2008  DATE OF DISCHARGE:  10/11/2008                               DISCHARGE SUMMARY   IDENTIFICATION:  This is a 35 year old separated white female from  [place], [place], who was admitted on a voluntary basis.   HISTORY OF PRESENT ILLNESS:  The patient was complaining of depression  and suicidal ideation.  She presents with the diagnosis of alcohol  abuse.  She expresses she did not get help.  She will blow her brain  out.  She feels like her OCD is taking over my body.  She also reports  unstable mood and positive anhedonia.  She has a history of OCD and  bipolar disorder.  She has been seen at the [facility].  She just  found out that her 3 year old daughter is pregnant and was very upset  about this.  For further admission information, see psychiatric  admission assessment.   PHYSICAL FINDINGS:  There were no acute physical or medical problems  noted.   ADMISSION LABORATORY:  UDS was positive for benzodiazepines.  CBC was  within normal limits.  TSH was 2.326.  Urine pregnancy test was  negative.  Alcohol level was 96.      Jasmine Pang, M.D.  Electronically Signed     BHS/MEDQ  D:  10/11/2008  T:  10/12/2008  Job:  [id number]
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
## 2010-08-10 NOTE — Discharge Summary (Signed)
[name], [name]          ACCOUNT NO.:  [id number]   MEDICAL RECORD NO.:  [id number]          PATIENT TYPE:  IPS   LOCATION:  0307                          [facility]ACILITY:  [facility]   PHYSICIAN:  Jasmine Pang, M.D. DATE OF BIRTH:  Nov 09, 1975   DATE OF ADMISSION:  10/07/2008  DATE OF DISCHARGE:  10/11/2008                               DISCHARGE SUMMARY   ADDENDUM   HOSPITAL COURSE:  Upon admission, the patient was started on Seroquel 25  mg p.o. q.i.d., thiamine 100 mg p.o. daily, and a multivitamin tablet 1  p.o. daily.  In individual sessions, the patient initially had pressured  speech.  She was dressed casually and was cooperative.  Her mood was  depressed and anxious.  Affect was consistent with mood.  She had  circumstantial thought processes.  There was no evidence of psychosis.  On October 08, 2008, Seroquel was increased to 50 mg p.o. q.6 h., p.r.n.  anxiety.  On October 08, 2008, also Celexa was discontinued.  She was  started on Depakote 500 mg now, Depakote 500 mg p.o. q.h.s., and  Risperdal 0.5 mg p.o. q.h.s.  Later on, the Seroquel was discontinued as  it was causing her to feel agitated, instead she was placed on Risperdal  0.5 mg p.o. q.6 hours, p.r.n. agitation.  On October 09, 2008, a hepatic  function panel was ordered, this was remarkable for an elevated SGOT of  81 and (0 to 37) and an elevated SGPT of 110 (0 to 35).  The patient  continued to be hyperverbal.  Her mood was hyper and anxious.  She  felt too elated.  She felt better after having gotten her Depakote,  but wondered if she could have an increase in her Risperdal.  She also  had not been able to sleep with the Ambien, this was discontinued and  was I started trazodone 50 mg p.o. q.h.s., may repeat x1, and Risperdal  was changed to 1 mg p.o. b.i.d. and 2 mg p.o. q.h.s.  On October 10, 2008,  the patient was less hyperverbal, speech was less pressured.  She was  less manic, less anxious, and less depressed.  Her  thoughts were logical  and goal directed.  She began to plan to go home the following day.  She  did request that her Risperdal be divided into a  4 time a day dosing  instead of 3 times a day to give her more coverage.  Her Risperdal was  changed to 1 mg in the morning, 0.5 mg at 12 p.m. and 5 p.m., and 2 mg  at bedtime, and trazodone was increased to 100 mg p.o. q.h.s.  Also, due  to anxiety, she was placed on Neurontin 100 mg p.o. q.4 h., p.r.n.  anxiety.  On October 11, 2008, mental status had improved markedly from  admission status.  Her sleep had improved, appetite was good.  Mood was  less depressed, less anxious.  Affect was consistent with mood.  Speech  was normal rate and flow.  There was no suicidal or homicidal ideation.  No thoughts of self-injurious behavior.  No auditory  or visual  hallucinations.  No paranoia or delusions.  Thoughts were logical and  goal-directed.  Thought content, no predominant theme.  Cognitive was  grossly intact.  Insight good.  Judgment good.  Impulse control good. It  was felt the patient was safe for discharge today.  She was not having  any side effects from her medications.  She needed samples of her  medications until her appointment.   DISCHARGE DIAGNOSES:  Axis I: Bipolar disorder, not otherwise specified,  anxiety disorder, not otherwise specified (features of obsessive-  compulsive disorder), and alcohol dependence.  Axis II:  None.  Axis III:  None.  Axis IV:  Severe (problems with primary support group, problems related  to social environment, other psychosocial problems, burden of chemical  dependence, and psychiatric illness).  Axis V:  Global assessment of functioning was 50 upon discharge.  GAF  was 40 upon admission.  GAF highest past year was 65.   DISCHARGE PLANS:  There was no specific activity level or dietary  restrictions.   POSTHOSPITAL CARE PLANS:  [facility], at 11:30  a.m.   DISCHARGE MEDICATIONS:  1. Depakote 500 mg tablet at bedtime.  2. Trazodone 100 mg at bedtime if needed.  3. Risperdal 1 mg 1 pill in the morning, one-half pill in evening at 5      p.m., and 2 pills at bedtime.  4. Neurontin 900 mg every 4 hours as needed for anxiety.      Jasmine Pang, M.D.  Electronically Signed     BHS/MEDQ  D:  10/11/2008  T:  10/12/2008  Job:  [id number]

## 2010-08-13 NOTE — Op Note (Signed)
NAME:  [name], [name]                         ACCOUNT NO.:  [id number]   MEDICAL RECORD NO.:  [id number]                   PATIENT TYPE:  INP   LOCATION:  9399                                 [facility]ILITY[facility]  [facility]   PHYSICIAN:  Lesly Dukes, M.D.              DATE OF BIRTH:  1975/12/12   DATE OF PROCEDURE:  02/19/2003  DATE OF DISCHARGE:                                 OPERATIVE REPORT   PREOPERATIVE DIAGNOSIS:  A 35 year old para 4-0-3-4, with severe disabling  dysmenorrhea.   POSTOPERATIVE DIAGNOSIS:  A 35 year old para 4-0-3-4, with severe disabling  dysmenorrhea.   PROCEDURE:  Diagnostic laparoscopy and transvaginal hysterectomy.   SURGEON:  Lesly Dukes, M.D.   ASSISTANTMichele Mcalpine D. Okey Dupre, M.D.   ANESTHESIA:  General.   ESTIMATED BLOOD LOSS:  100.   COMPLICATIONS:  None.   FINDINGS:  A slightly enlarged, normal-shaped uterus, normal ovaries and  tubes.  No adhesions in the abdomen.   DESCRIPTION OF PROCEDURE:  After informed consent was obtained, the patient  was taken to the operating room, where general anesthesia was found to be  adequate.  She was placed in the dorsal lithotomy position and prepped and  draped in the normal sterile fashion.  A Foley was placed in the bladder.  An infraumbilical skin incision was made with the scalpel and the Veress  needle was inserted.  Pneumoperitoneum was achieved with CO2.  The Veress  needle was removed and a trocar was placed into the abdomen.  The camera was  introduced to the abdomen, and survey of the abdominal contents was  performed.  There were no adhesions, and it was decided to proceed with  transvaginal hysterectomy.  At this point the Foley was removed and a  scalpel was used to circumferentially inscribe the cervix.  Using a sponge,  blunt dissection was used to identify the peritoneum posteriorly.  The  peritoneum was tented up and entered sharply with the Metzenbaum scissors.  This incision was extended  bilaterally with the Metzenbaum scissors.  Heaney  clamps were then used to take stepwise bites of the uterus bilaterally.  Each pedicle was clamped, transected, and suture ligated with 0 chromic with  good hemostasis.  After the uterines were sequentially ligated, the uterus  was flipped posteriorly and the utero-ovarian pedicle was tied with a free  tie, clamped, transected, and suture ligated with 0 chromic.  At this point  the specimen was handed freed and handed off the table.  Close inspection of  all pedicles noted good hemostasis.  The peritoneum and vagina were then  closed with a running suture of 0 chromic.  Good hemostasis was noted from  the cuff.  One last look was noted above via the laparoscope, and there was  no bleeding from any pedicle.  The Foley was then replaced in the bladder.  The fascia of the infraumbilical incision was closed with 0  Vicryl in a  running fashion.  Good hemostasis was noted.  The skin was closed with 4-0  Vicryl in a running fashion.  The patient tolerated the procedure well.  The  sponge, lap, instrument, and needle count were correct x2, and the patient  went to the recovery room in stable condition.                                               Lesly Dukes, M.D.    Lora Paula  D:  02/19/2003  T:  02/19/2003  Job:  [id number]

## 2010-08-13 NOTE — Discharge Summary (Signed)
NAME:  [name], [name]                         ACCOUNT NO.:  [id number]   MEDICAL RECORD NO.:  [id number]                   PATIENT TYPE:  INP   LOCATION:  9325                                 [facility]   PHYSICIAN:  Phil D. Okey Dupre, M.D.                  DATE OF BIRTH:  Aug 13, 1975   DATE OF ADMISSION:  02/19/2003  DATE OF DISCHARGE:  02/21/2003                                 DISCHARGE SUMMARY   HISTORY OF PRESENT ILLNESS:  The patient is a 35 year old white female  gravida 7, para 4-0-3-4 who has had severe pelvic pain with increasing  dysmenorrhea over the past three years.  Was admitted for total vaginal  hysterectomy but because of her pain we wanted to be sure that the pelvis  was clear of endometriosis or any other adhesions so she underwent a  diagnostic laparoscopy just before the vaginal hysterectomy.  Showed a  completely normal pelvis.  Vaginal hysterectomy was undertaken.  The ovaries  were completely normal.  The patient had an afebrile course postoperatively.  She has a discharge hemoglobin of 12.1 following admission of 13.8 with a  hematocrit at discharge of 34.5 with an admission of 40.3.  Her other  laboratory tests were all normal.  She will be followed in the GYN clinic in  two weeks.   DISCHARGE INSTRUCTIONS:  Discharge instructions as to activity, follow-up,  and diet have been given to the patient in detail.  We are also discharging  her on Motrin 600 mg q.6h. #40 and Percocet one or two q.4h. p.r.n. for pain  5 mg #30.   DISCHARGE DIAGNOSES:  Status satisfactory post vaginal hysterectomy for  pelvic pain.                                               Phil D. Okey Dupre, M.D.    PDR/MEDQ  D:  02/21/2003  T:  02/21/2003  Job:  [id number]

## 2010-08-13 NOTE — H&P (Signed)
[facility]  Patient:    [name], [name]                        MRN: [id number] Adm. Date:  [id number] Attending:  Otilio Saber Dictator:   Johnella Moloney, N.P.                         History and Physical  IDENTIFYING INFORMATION:  [name] is a 35 year old, white, married female admitted on June 08, 2000, on a voluntary basis.  CHIEF COMPLAINT:  "I just wanna be happy and not depressed."  HISTORY OF PRESENT ILLNESS:  The patient was having significant marital problems.  They had been going on for some time.  The patient is 35 years old and has four children.  She has been with this fellow for approximately 12 years.  Apparently she did tell her husband yesterday that she wanted to go her separate ways and see if she would feel better.  The husband of course did not want to do this.  The husband started calling the patient names.  The husband called his mother and apparently her mother-in-law was on her case also.  She states that she feels like being in the hospital, though her husband will hold this against her and try to get custody of the four kids. She states that she just feels like she has no where to go.  She feels trapped.  She states that she would like to move out, get a job, and get her four children.  However, the patient has no car and trying to get out of this marriage is very difficult for her.  Her mother is in [place], [place], however, there is no room at [facility] for her and her four children.  The patient alleges that her husband has been unfaithful and she has had to raise four children on her own without her husbands help.  She apparently found out about an alleged affair one and a half years ago.  She states that during her fourth pregnancy he left her at the hospital.  He apparently was having the alleged affair and left her alone while she had the baby and had her tubal ligation.  The patient reports being  very depressed for five to six months.  She really points to her husbands infidelity as leading up to this and the feeling of being very trapped.  She states that she either sleeps too much or not enough.  She states that if she is stressed, she wants to go to bed.  Her appetite has been decreased with a weight loss of 15 pounds in one month.  She states that she is the type of person that masks her feelings and pretends like everything is okay.  She does have anhedonia and decreased energy.  She has suicidal ideation on and off.   Yesterday she was thinking about driving the car off of the road.  She has racing thoughts.  She did tell me that when she feels well that sometimes she has racing thoughts and is talkative and somewhat overactive.  She also has a history of anxiety attacks.  PAST PSYCHIATRIC HISTORY:  The patient denies any previous inpatient and outpatient treatments.  She has had no previous suicide attempts.  PAST MEDICAL HISTORY:  The patient goes to [facility] and was seen there last year.  Medical Problems:  She has no  medical problems.  She does have a history of anemia and a tubal ligation in 2000.  CURRENT MEDICATIONS:  None.  DRUG ALLERGIES:  SULFA which gives her hives.  SOCIAL HISTORY:  The patient has been married x 2 years.  However, she has been with her husband for about 12 years.  She has four children, a son age 21, a daughter age 1, a son age 45, and another daughter 39-1/2 years old.  She apparently lives with her husband at her father-in-laws house in [place], [place].  Her basic problem is marital conflict.  She states "he cheated on me."  Her mother is living.  Her father died last year at the age of 22 of probably cirrhosis of the liver.  She has two sisters and one brother who are younger.  She completed the eighth grade.  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  FAMILY HISTORY:  Her father was bipolar and abused  alcohol.  ALCOHOL AND DRUG HISTORY:  She drinks occasionally, maybe four beers a night once a week.  Denies any substance abuse.  She smokes about a half of a pack of cigarettes a day.  POSITIVE PHYSICAL FINDINGS:  See physical exam done at the [facility] on June 08, 2000.  The urine pregnancy test was negative.  The CBC with differential was within normal limits.  The urine drug screen was positive for benzodiazepine.  She does acknowledge that she took Xanax to help calm herself down.  Her husband apparently has some Xanax.  She states that the Xanax really does not work and it does not calm her down.  The blood alcohol level was less than 10.  Her urinalysis was negative. Temperature 97.5 degrees, pulse 76, respirations 12, blood pressure 120/75. Weight 126 pounds.  Height 5 feet 4 inches.  CURRENT MENTAL STATUS EXAMINATION:  An attractive, young adult female who is casually dressed.  She is cooperative.  Her speech is slightly pressured, but it is relevant.  Mood:  Sad, anxious.  Affect:  Depressed.  At first she acknowledges passive suicidal ideation.  However, then she tells me that she really has continued to have suicidal ideation, but feels safe here in the hospital.  She feels hopeless.  She denies homicidal ideation or intent. Thought processes are logical and coherent without evidence of psychosis.  No hallucinations.  Cognitive:  Alert and oriented.  Cognitive function is intact.  Average intelligence.  Judgment fair.  Insight fair.  Impulse control fair.  CURRENT DIAGNOSES: Axis I.    1. Major depression, single episode.            2. Anxiety disorder not otherwise specified. Axis II.   None. Axis III.  History of anemia. Axis IV.   Psychosocial stressors:  Severe related to problems with her            primary support group, social environment, occupational problems,            housing problems, educational problems, and mainly her marital             problems. Axis V.    Current global assessment of functioning is 35.  The highest in the            past year is 69.   TREATMENT PLAN AND RECOMMENDATIONS:  Voluntary admission to the [facility].  Check every 15 minutes.  Maintain safety.  The patient does feel safe in the hospital and she is able to contract for safety here in the hospital, although she continues to have suicidal ideation.  Make sure that she has a thyroid panel and a CMET since those were not done at [facility].  Ambien 10 mg p.o. h.s. p.r.n. for sleep.  Effexor 37.5 mg p.o. q.a.m.  We will give a dose today.  Seroquel 25 mg p.o. q.6h. p.r.n. if needed for anxiety.  Have the patient attend groups.  At some point we might need to consider a session with her and her husband.  Also have [place] fax the physical exam that they did on June 08, 2000.  Tentative length of stay and discharge planned three days. DD:  06/09/00 TD:  06/09/00 Job: [id number]

## 2010-08-13 NOTE — Group Therapy Note (Signed)
NAME:  [name], [name]                         ACCOUNT NO.:  [id number]   MEDICAL RECORD NO.:  [id number]                   PATIENT TYPE:  OUT   LOCATION:  [facility]                           FACILITY:  [facility]   PHYSICIAN:  Argentina Donovan, MD                     DATE OF BIRTH:  06-30-1975   DATE OF SERVICE:  04/15/2003                                    CLINIC NOTE   This patient is a 35 year old white female who underwent total vaginal  hysterectomy following laparoscopy which showed a clean, normal pelvis  approximately 8 weeks ago.  Had a fairly unremarkable recovery and now comes  in with severe lower abdominal cramps.  States she has had some bleeding  that she sees in the toilet and also when she wipes.  The abdomen was soft,  flat, nontender.  No mass, no organomegaly.  External genitalia is normal.  Milking of the urethra does not show any significant signs of anything.  BUS  within normal limits.  Vagina is clean and well rugated, and the apex is  well-healed.  On bimanual exam there is exquisite tenderness of the anterior  abdominal wall when the bladder is compressed.  We are going to get a  urological referral.  Start her on Pyridium.  Get a urine culture and  sensitivity as well as an analysis.   IMPRESSION:  Possible interstitial cystitis.  She has no other urinary  symptoms such as frequency or nocturia, so it is difficult to evaluate this  patient beyond that.                                               Argentina Donovan, MD    PR/MEDQ  D:  04/15/2003  T:  04/16/2003  Job:  [id number]

## 2010-08-13 NOTE — Group Therapy Note (Signed)
   NAME:  [name], [name] NO.:  [id number]   MEDICAL RECORD NO.:  [id number]                   PATIENT TYPE:  OUT   LOCATION:  [facility]                           [facility]  [facility]   PHYSICIAN:  Argentina Donovan, MD                     DATE OF BIRTH:  April 10, 1975   DATE OF SERVICE:  01/30/2003                                    CLINIC NOTE   HISTORY OF PRESENT ILLNESS:  The patient is a 35 year old gravida 7, para 4-  0-3-4.  You can refer to the previous visit in January of 2004.  She has  continued to have disabling dysmenorrhea which keeps her in bed in spite of  trying to use oral contraceptives which cause bloating and an increase in  her pain as well as no help from Bextra or ibuprofen.  We have discussed  with the patient her history and also her symptoms.  She also complains of  urinary urgency just during her period.  My impression is this patient is  consistent with prostaglandin or adenomyosis and will be scheduled for  vaginal hysterectomy.  However, the possibility of endometriosis and  scarring, especially with a history of a tubal ligation we will do a  laparoscopy just prior to the vaginal hysterectomy with the possibility of  abdominal hysterectomy if we find scarring that would contraindicate vaginal  hysterectomy.  The patient is otherwise in good health.  Will need  prophylactic antibiotics for a prolapsed mitral valve and added to her  history that we did not get last time, she states her mother and her  grandmother had ovarian cancer.  However, both seem to do well after surgery  so I am not sure this was not just ovarian cysts and she is going to check  further into that.   IMPRESSION:  Intractable dysmenorrhea with chronic pelvic pain, urinary  urgency, probable adenomyosis.  Will be scheduled for surgery.                                               Argentina Donovan, MD    PR/MEDQ  D:  01/30/2003  T:  01/30/2003  Job:  [id number]

## 2010-08-13 NOTE — Discharge Summary (Signed)
NAMEMarland Kitchen  RONALD, VINSANT NO.:  [id number]   MEDICAL RECORD NO.:  [id number]                   PATIENT TYPE:  IPS   LOCATION:  0300                                 [facility]  [facility]   PHYSICIAN:  Jeanice Lim, M.D.              DATE OF BIRTH:  19-Sep-1975   DATE OF ADMISSION:  12/13/2001  DATE OF DISCHARGE:  12/18/2001                                 DISCHARGE SUMMARY   IDENTIFYING DATA:  This is a 35 year old white female voluntarily admitted,  referred by [facility].  She separated from husband in  January, started using crack, snorting, smoking, showing mood lability,  cocaine cravings, poor appetite and suicidal ideation at the time of  admission.   MEDICATIONS:  None.  The patient, in the past, had been on Celexa and  BuSpar.   ALLERGIES:  SULFA medications.   PHYSICAL EXAMINATION:  Essentially within normal limits.  Neurologically  nonfocal.   LABORATORY DATA:  Routine admission labs essentially within normal limits.   MENTAL STATUS EXAM:  Slim-built, attractive female fully alert, anxious,  tearful, cooperative, pleasant.  Speech within normal limits.  Wanted to get  off the drugs.  Mood depressed.  Affect anxious and restricted.  Also wanted  to reconcile with husband and children.  Thought processes goal directed.  Thought content positive for suicidal ideation, contracting for safety.  No  psychotic symptoms.  Cognitively intact.  Judgment and insight fair.   ADMISSION DIAGNOSES:   AXIS I:  1. Major depressive disorder, recurrent, moderate.  2. Polysubstance abuse.  3. Cocaine abuse.   AXIS II:  None.   AXIS III:  History of cardiac palpitations.   AXIS IV:  Severe (related to limited support system and separation).   AXIS V:  26/60.   HOSPITAL COURSE:  The patient was admitted and ordered routine p.r.n.  medications and underwent further monitoring.  She was encouraged to  participate in individual, group  and milieu therapy.  The patient was  started on Zyprexa and Lexapro as well as Symmetrel for cocaine withdrawal  and to stabilize mood and agitation.  Lexapro was optimized and Wellbutrin  was started for continued depressive symptoms.  Aftercare planning was held  and patient was quite motivated to be compliant with the aftercare plan and  remain sober.  Family was comfortable with discharge plan.   CONDITION ON DISCHARGE:  Markedly improved.  Mood was more euthymic.  Affect  brighter.  Thought processes goal directed.  Thought content negative for  dangerous ideation or psychotic symptoms.   DISCHARGE MEDICATIONS:  1. Zyprexa 5 mg q.h.s.  2. Symmetrel 100 mg b.i.d.  3. Lexapro 10 mg, 2 q.a.m.  4. Wellbutrin 150 mg XL q.a.m.   FOLLOW UP:  [facility] on December 20, 2001 at  9:30 a.m. with Dr. Lang Snow.   DISCHARGE DIAGNOSES:   AXIS I:  1. Major depressive disorder, recurrent, moderate.  2. Polysubstance abuse.  3. Cocaine abuse.   AXIS II:  None.   AXIS III:  History of cardiac palpitations.   AXIS IV:  Severe (related to limited support system and separation).   AXIS V:  Global Assessment of Functioning on discharge 55.                                               Jeanice Lim, M.D.    JEM/MEDQ  D:  02/04/2002  T:  02/04/2002  Job:  [id number]
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
## 2010-08-26 ENCOUNTER — Emergency Department (HOSPITAL_COMMUNITY)
Admission: EM | Admit: 2010-08-26 | Discharge: 2010-08-27 | Disposition: A | Discharge status: Other Acute Inpatient Hospital | Payer: Self-pay | Attending: Emergency Medicine | Admitting: Emergency Medicine

## 2010-08-26 DIAGNOSIS — T50904A Poisoning by unspecified drugs, medicaments and biological substances, undetermined, initial encounter: Secondary | ICD-10-CM | POA: Insufficient documentation

## 2010-08-26 DIAGNOSIS — F3289 Other specified depressive episodes: Secondary | ICD-10-CM | POA: Insufficient documentation

## 2010-08-26 DIAGNOSIS — X58XXXA Exposure to other specified factors, initial encounter: Secondary | ICD-10-CM | POA: Insufficient documentation

## 2010-08-26 DIAGNOSIS — S51809A Unspecified open wound of unspecified forearm, initial encounter: Secondary | ICD-10-CM | POA: Insufficient documentation

## 2010-08-26 DIAGNOSIS — F329 Major depressive disorder, single episode, unspecified: Secondary | ICD-10-CM | POA: Insufficient documentation

## 2010-08-26 DIAGNOSIS — R4182 Altered mental status, unspecified: Secondary | ICD-10-CM | POA: Insufficient documentation

## 2010-08-26 DIAGNOSIS — T50901A Poisoning by unspecified drugs, medicaments and biological substances, accidental (unintentional), initial encounter: Secondary | ICD-10-CM | POA: Insufficient documentation

## 2010-08-26 LAB — CBC
HCT: 39.4 % (ref 36.0–46.0)
MCH: 33.8 pg (ref 26.0–34.0)
MCHC: 34.3 g/dL (ref 30.0–36.0)
RDW: 13 % (ref 11.5–15.5)

## 2010-08-26 LAB — SALICYLATE LEVEL: Salicylate Lvl: <2 mg/dL — ABNORMAL LOW (ref 2.8–20.0)

## 2010-08-26 LAB — DIFFERENTIAL
Basophils Absolute: 0 10*3/uL (ref 0.0–0.1)
Basophils Relative: 0 % (ref 0–1)
Eosinophils Relative: 4 % (ref 0–5)
Monocytes Absolute: 0.4 10*3/uL (ref 0.1–1.0)
Monocytes Relative: 7 % (ref 3–12)

## 2010-08-26 LAB — BASIC METABOLIC PANEL
CO2: 24 mEq/L (ref 19–32)
Calcium: 8.7 mg/dL (ref 8.4–10.5)
Creatinine, Ser: 0.56 mg/dL (ref 0.4–1.2)
Glucose, Bld: 93 mg/dL (ref 70–99)

## 2010-08-27 ENCOUNTER — Inpatient Hospital Stay (HOSPITAL_COMMUNITY)
Admission: AD | Admit: 2010-08-27 | Discharge: 2010-09-02 | DRG: 885 | Disposition: A | Discharge status: Home / Self Care | Payer: PRIVATE HEALTH INSURANCE | Source: Ambulatory Visit | Attending: Psychiatry | Admitting: Psychiatry

## 2010-08-27 DIAGNOSIS — F332 Major depressive disorder, recurrent severe without psychotic features: Principal | ICD-10-CM

## 2010-08-27 DIAGNOSIS — M549 Dorsalgia, unspecified: Secondary | ICD-10-CM

## 2010-08-27 DIAGNOSIS — F329 Major depressive disorder, single episode, unspecified: Secondary | ICD-10-CM

## 2010-08-27 DIAGNOSIS — X58XXXA Exposure to other specified factors, initial encounter: Secondary | ICD-10-CM

## 2010-08-27 DIAGNOSIS — J438 Other emphysema: Secondary | ICD-10-CM

## 2010-08-27 DIAGNOSIS — S51809A Unspecified open wound of unspecified forearm, initial encounter: Secondary | ICD-10-CM

## 2010-08-27 DIAGNOSIS — F429 Obsessive-compulsive disorder, unspecified: Secondary | ICD-10-CM

## 2010-08-27 DIAGNOSIS — F1020 Alcohol dependence, uncomplicated: Secondary | ICD-10-CM

## 2010-08-27 DIAGNOSIS — Z818 Family history of other mental and behavioral disorders: Secondary | ICD-10-CM

## 2010-08-27 DIAGNOSIS — Z560 Unemployment, unspecified: Secondary | ICD-10-CM

## 2010-08-27 DIAGNOSIS — T07XXXA Unspecified multiple injuries, initial encounter: Secondary | ICD-10-CM

## 2010-08-27 DIAGNOSIS — X838XXA Intentional self-harm by other specified means, initial encounter: Secondary | ICD-10-CM

## 2010-08-27 DIAGNOSIS — F3112 Bipolar disorder, current episode manic without psychotic features, moderate: Secondary | ICD-10-CM

## 2010-08-27 DIAGNOSIS — F1010 Alcohol abuse, uncomplicated: Secondary | ICD-10-CM

## 2010-08-27 LAB — URINALYSIS, ROUTINE W REFLEX MICROSCOPIC
Bilirubin Urine: NEGATIVE
Hgb urine dipstick: NEGATIVE
Ketones, ur: NEGATIVE mg/dL
Nitrite: NEGATIVE
pH: 5.5 (ref 5.0–8.0)

## 2010-08-27 LAB — RAPID URINE DRUG SCREEN, HOSP PERFORMED: Cocaine: NOT DETECTED

## 2010-08-28 LAB — HEPATIC FUNCTION PANEL
ALT: 43 U/L — ABNORMAL HIGH (ref 0–35)
AST: 35 U/L (ref 0–37)
Alkaline Phosphatase: 72 U/L (ref 39–117)
Indirect Bilirubin: 0.2 mg/dL — ABNORMAL LOW (ref 0.3–0.9)
Total Protein: 7 g/dL (ref 6.0–8.3)

## 2010-08-30 LAB — COMPREHENSIVE METABOLIC PANEL
ALT: 32 U/L (ref 0–35)
AST: 25 U/L (ref 0–37)
Albumin: 3.6 g/dL (ref 3.5–5.2)
Alkaline Phosphatase: 67 U/L (ref 39–117)
BUN: 15 mg/dL (ref 6–23)
Chloride: 102 mEq/L (ref 96–112)
GFR calc Af Amer: >60 mL/min (ref >60)
Potassium: 3.9 mEq/L (ref 3.5–5.1)
Sodium: 139 mEq/L (ref 135–145)
Total Bilirubin: 0.2 mg/dL — ABNORMAL LOW (ref 0.3–1.2)

## 2010-08-30 LAB — HCG, SERUM, QUALITATIVE: Preg, Serum: NEGATIVE
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
## 2010-11-12 ENCOUNTER — Emergency Department (HOSPITAL_COMMUNITY): Payer: Medicaid Other

## 2010-11-12 ENCOUNTER — Emergency Department (HOSPITAL_COMMUNITY)
Admission: EM | Admit: 2010-11-12 | Discharge: 2010-11-12 | Disposition: A | Discharge status: Home / Self Care | Payer: Medicaid Other | Attending: Emergency Medicine | Admitting: Emergency Medicine

## 2010-11-12 DIAGNOSIS — F341 Dysthymic disorder: Secondary | ICD-10-CM | POA: Insufficient documentation

## 2010-11-12 DIAGNOSIS — W1809XA Striking against other object with subsequent fall, initial encounter: Secondary | ICD-10-CM | POA: Insufficient documentation

## 2010-11-12 DIAGNOSIS — M549 Dorsalgia, unspecified: Secondary | ICD-10-CM | POA: Insufficient documentation

## 2010-11-12 DIAGNOSIS — Z79899 Other long term (current) drug therapy: Secondary | ICD-10-CM | POA: Insufficient documentation

## 2010-11-12 DIAGNOSIS — IMO0002 Reserved for concepts with insufficient information to code with codable children: Secondary | ICD-10-CM | POA: Insufficient documentation

## 2010-11-12 DIAGNOSIS — M79609 Pain in unspecified limb: Secondary | ICD-10-CM | POA: Insufficient documentation

## 2010-12-14 IMAGING — CR DG CHEST 2V
2 series · 2 of 2 positions shown · non-contrast
Comparison: Chest x-ray of 06/20/2007

CLINICAL DATA: Short of breath, cough, congestion

CHEST - 2 VIEW

[w chest pa]
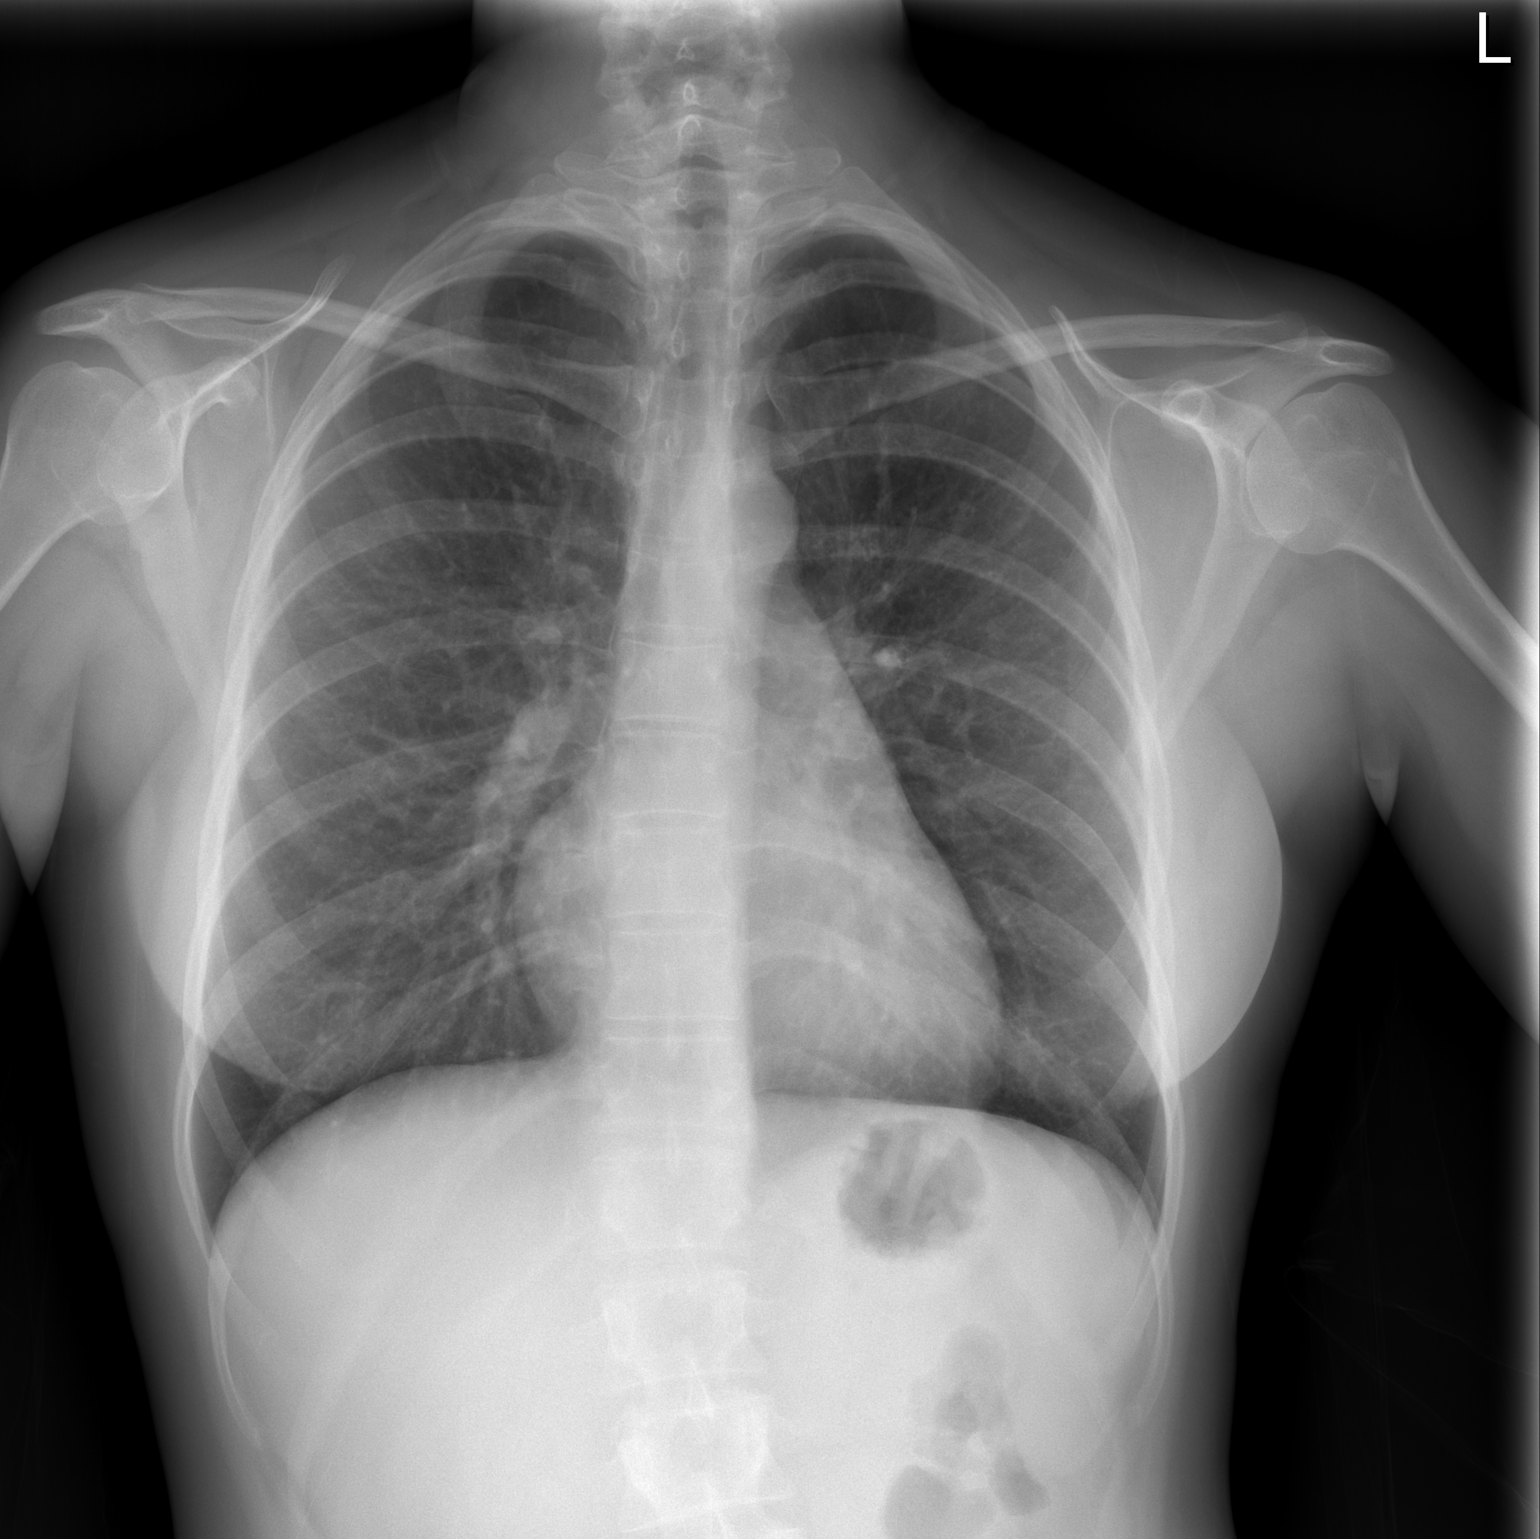

[w chest lat]
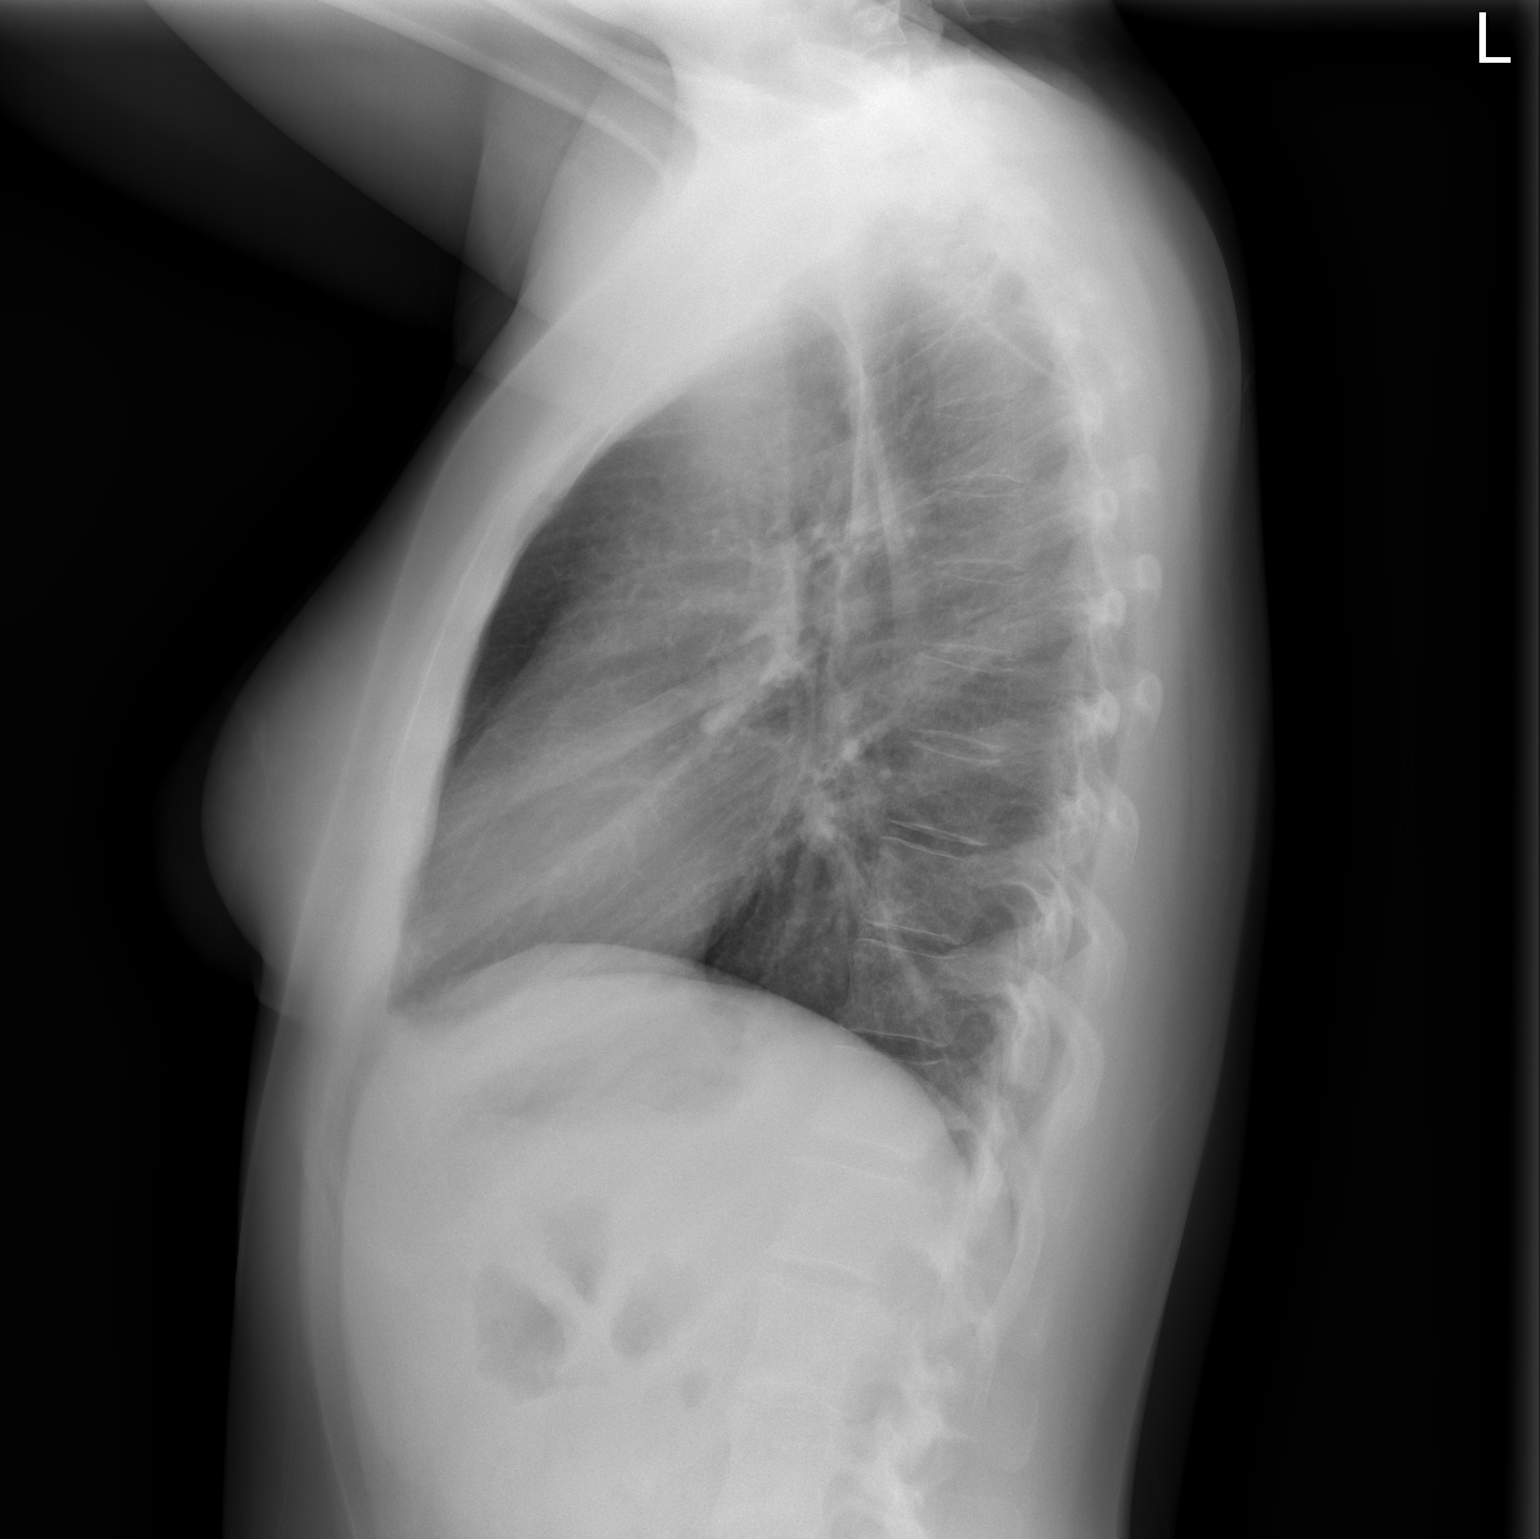

[2 of 2 positions shown; findings below may reference images not displayed]

FINDINGS: The lungs are clear. The heart is within normal limits in
size. No bony abnormality is seen.
IMPRESSION: No active lung disease.

## 2010-12-21 LAB — URINALYSIS, ROUTINE W REFLEX MICROSCOPIC
Bilirubin Urine: NEGATIVE
Glucose, UA: NEGATIVE
Specific Gravity, Urine: 1.023
pH: 6

## 2010-12-21 LAB — URINE MICROSCOPIC-ADD ON

## 2010-12-21 LAB — URINE CULTURE

## 2010-12-22 LAB — POCT I-STAT, CHEM 8
Calcium, Ion: 1.16
Chloride: 104
Glucose, Bld: 85
HCT: 43
Hemoglobin: 14.6

## 2010-12-22 LAB — WET PREP, GENITAL
WBC, Wet Prep HPF POC: NONE SEEN
Yeast Wet Prep HPF POC: NONE SEEN

## 2010-12-22 LAB — URINALYSIS, ROUTINE W REFLEX MICROSCOPIC
Glucose, UA: 100 — AB
Hgb urine dipstick: NEGATIVE
Specific Gravity, Urine: 1.023
Urobilinogen, UA: 0.2

## 2010-12-22 LAB — GC/CHLAMYDIA PROBE AMP, GENITAL
Chlamydia, DNA Probe: NEGATIVE
GC Probe Amp, Genital: NEGATIVE

## 2010-12-27 ENCOUNTER — Emergency Department (HOSPITAL_COMMUNITY)
Admission: EM | Admit: 2010-12-27 | Discharge: 2010-12-28 | Disposition: A | Discharge status: Other Acute Inpatient Hospital | Payer: Medicaid Other | Source: Home / Self Care | Attending: Emergency Medicine | Admitting: Emergency Medicine

## 2010-12-27 ENCOUNTER — Ambulatory Visit (HOSPITAL_COMMUNITY)
Admission: RE | Admit: 2010-12-27 | Discharge: 2010-12-27 | Disposition: A | Discharge status: Home / Self Care | Payer: Medicaid Other | Attending: Psychiatry | Admitting: Psychiatry

## 2010-12-28 ENCOUNTER — Inpatient Hospital Stay (HOSPITAL_COMMUNITY)
Admission: AD | Admit: 2010-12-28 | Discharge: 2010-12-31 | DRG: 897 | Disposition: A | Discharge status: Home / Self Care | Payer: Medicaid Other | Source: Ambulatory Visit | Attending: Psychiatry | Admitting: Psychiatry

## 2010-12-28 ENCOUNTER — Emergency Department (HOSPITAL_COMMUNITY): Payer: Medicaid Other

## 2010-12-28 DIAGNOSIS — J438 Other emphysema: Secondary | ICD-10-CM

## 2010-12-28 DIAGNOSIS — Z6379 Other stressful life events affecting family and household: Secondary | ICD-10-CM

## 2010-12-28 DIAGNOSIS — F3289 Other specified depressive episodes: Secondary | ICD-10-CM

## 2010-12-28 DIAGNOSIS — F603 Borderline personality disorder: Secondary | ICD-10-CM

## 2010-12-28 DIAGNOSIS — Z91199 Patient's noncompliance with other medical treatment and regimen due to unspecified reason: Secondary | ICD-10-CM

## 2010-12-28 DIAGNOSIS — F329 Major depressive disorder, single episode, unspecified: Secondary | ICD-10-CM

## 2010-12-28 DIAGNOSIS — F1020 Alcohol dependence, uncomplicated: Principal | ICD-10-CM

## 2010-12-28 DIAGNOSIS — Z79899 Other long term (current) drug therapy: Secondary | ICD-10-CM

## 2010-12-28 DIAGNOSIS — Z9119 Patient's noncompliance with other medical treatment and regimen: Secondary | ICD-10-CM

## 2010-12-28 DIAGNOSIS — F0781 Postconcussional syndrome: Secondary | ICD-10-CM

## 2010-12-28 DIAGNOSIS — Z560 Unemployment, unspecified: Secondary | ICD-10-CM

## 2010-12-28 DIAGNOSIS — Z882 Allergy status to sulfonamides status: Secondary | ICD-10-CM

## 2010-12-28 DIAGNOSIS — R45851 Suicidal ideations: Secondary | ICD-10-CM

## 2010-12-28 DIAGNOSIS — Z818 Family history of other mental and behavioral disorders: Secondary | ICD-10-CM

## 2010-12-28 LAB — DIFFERENTIAL
Eosinophils Absolute: 0.2 10*3/uL (ref 0.0–0.7)
Eosinophils Relative: 2 % (ref 0–5)
Lymphocytes Relative: 43 % (ref 12–46)
Lymphs Abs: 3 10*3/uL (ref 0.7–4.0)
Monocytes Absolute: 0.4 10*3/uL (ref 0.1–1.0)
Monocytes Relative: 5 % (ref 3–12)

## 2010-12-28 LAB — RAPID URINE DRUG SCREEN, HOSP PERFORMED
Amphetamines: NOT DETECTED
Barbiturates: NOT DETECTED
Opiates: NOT DETECTED
Tetrahydrocannabinol: NOT DETECTED

## 2010-12-28 LAB — ETHANOL: Alcohol, Ethyl (B): 255 mg/dL — ABNORMAL HIGH (ref 0–11)

## 2010-12-28 LAB — COMPREHENSIVE METABOLIC PANEL
AST: 23 U/L (ref 0–37)
Albumin: 3.8 g/dL (ref 3.5–5.2)
BUN: 16 mg/dL (ref 6–23)
Calcium: 8.5 mg/dL (ref 8.4–10.5)
Chloride: 108 mEq/L (ref 96–112)
Creatinine, Ser: 0.61 mg/dL (ref 0.50–1.10)
Total Bilirubin: 0.1 mg/dL — ABNORMAL LOW (ref 0.3–1.2)
Total Protein: 7.1 g/dL (ref 6.0–8.3)

## 2010-12-28 LAB — CBC
HCT: 40.7 % (ref 36.0–46.0)
MCH: 33.6 pg (ref 26.0–34.0)
MCHC: 34.6 g/dL (ref 30.0–36.0)
MCV: 96.9 fL (ref 78.0–100.0)
Platelets: 289 10*3/uL (ref 150–400)
RDW: 13.7 % (ref 11.5–15.5)
WBC: 6.9 10*3/uL (ref 4.0–10.5)

## 2010-12-29 DIAGNOSIS — F1920 Other psychoactive substance dependence, uncomplicated: Secondary | ICD-10-CM

## 2010-12-29 DIAGNOSIS — F17200 Nicotine dependence, unspecified, uncomplicated: Secondary | ICD-10-CM

## 2010-12-29 DIAGNOSIS — F1020 Alcohol dependence, uncomplicated: Secondary | ICD-10-CM

## 2010-12-29 LAB — URINALYSIS, ROUTINE W REFLEX MICROSCOPIC
Ketones, ur: NEGATIVE
Protein, ur: NEGATIVE
Urobilinogen, UA: 0.2

## 2010-12-29 LAB — PREGNANCY, URINE: Preg Test, Ur: NEGATIVE

## 2010-12-29 LAB — URINE MICROSCOPIC-ADD ON

## 2011-01-04 LAB — COMPREHENSIVE METABOLIC PANEL
ALT: 14
AST: 14
CO2: 27
Calcium: 9.2
Chloride: 106
GFR calc Af Amer: >60
GFR calc non Af Amer: >60
Glucose, Bld: 94
Sodium: 138
Total Bilirubin: 0.9

## 2011-01-04 LAB — CBC
Hemoglobin: 13.4
MCHC: 34.5
MCV: 95
RBC: 4.11
WBC: 5.6

## 2011-01-04 LAB — DIFFERENTIAL
Basophils Absolute: 0
Basophils Relative: 0
Eosinophils Absolute: 0.1
Eosinophils Relative: 2
Neutrophils Relative %: 66

## 2011-01-04 LAB — URINALYSIS, ROUTINE W REFLEX MICROSCOPIC
Bilirubin Urine: NEGATIVE
Hgb urine dipstick: NEGATIVE
Nitrite: NEGATIVE
Specific Gravity, Urine: 1.022
Urobilinogen, UA: 0.2
pH: 5.5

## 2011-01-04 LAB — WET PREP, GENITAL
Trich, Wet Prep: NONE SEEN
Yeast Wet Prep HPF POC: NONE SEEN
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
## 2011-01-07 LAB — BASIC METABOLIC PANEL
CO2: 24
Calcium: 8.9
Chloride: 105
GFR calc Af Amer: >60
Glucose, Bld: 89
Potassium: 4
Sodium: 137

## 2011-01-07 LAB — CBC
HCT: 41.2
Hemoglobin: 14.3
MCHC: 34.8
MCV: 92.9
RBC: 4.43

## 2011-01-07 LAB — DIFFERENTIAL
Basophils Relative: 0
Eosinophils Relative: 2
Monocytes Absolute: 0.3
Monocytes Relative: 8
Neutro Abs: 2.6

## 2011-01-07 LAB — POCT CARDIAC MARKERS
CKMB, poc: 1.1
Myoglobin, poc: 66.1
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
## 2011-01-25 ENCOUNTER — Encounter: Payer: Self-pay | Admitting: Emergency Medicine

## 2011-01-25 ENCOUNTER — Emergency Department (HOSPITAL_COMMUNITY)
Admission: EM | Admit: 2011-01-25 | Discharge: 2011-01-25 | Disposition: A | Discharge status: Home / Self Care | Payer: Medicaid Other | Attending: Emergency Medicine | Admitting: Emergency Medicine

## 2011-01-25 ENCOUNTER — Emergency Department (HOSPITAL_COMMUNITY): Payer: Medicaid Other

## 2011-01-25 DIAGNOSIS — Y92009 Unspecified place in unspecified non-institutional (private) residence as the place of occurrence of the external cause: Secondary | ICD-10-CM | POA: Insufficient documentation

## 2011-01-25 DIAGNOSIS — IMO0002 Reserved for concepts with insufficient information to code with codable children: Secondary | ICD-10-CM | POA: Insufficient documentation

## 2011-01-25 DIAGNOSIS — M79609 Pain in unspecified limb: Secondary | ICD-10-CM | POA: Insufficient documentation

## 2011-01-25 DIAGNOSIS — S92919A Unspecified fracture of unspecified toe(s), initial encounter for closed fracture: Secondary | ICD-10-CM

## 2011-01-25 MED ORDER — HYDROCODONE-ACETAMINOPHEN 5-325 MG PO TABS
1.0000 | ORAL_TABLET | Freq: Once | ORAL | Status: AC
  Administered 2011-01-25: 1 via ORAL
  Filled 2011-01-25: qty 1

## 2011-01-25 MED ORDER — HYDROCODONE-ACETAMINOPHEN 5-325 MG PO TABS: 1.0000 | ORAL_TABLET | ORAL | Status: AC | PRN

## 2011-01-25 NOTE — ED Notes (Signed)
Buddy tape to right 4th and 5th toe, post op shoe applied

## 2011-01-25 NOTE — ED Provider Notes (Signed)
Medical screening examination/treatment/procedure(s) were performed by non-physician practitioner and as supervising physician I was immediately available for consultation/collaboration.   Celene Kras, MD 01/25/11 [phone]
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
## 2011-02-12 ENCOUNTER — Encounter (HOSPITAL_COMMUNITY): Payer: Self-pay | Admitting: *Deleted

## 2011-02-12 ENCOUNTER — Emergency Department (HOSPITAL_COMMUNITY)
Admission: EM | Admit: 2011-02-12 | Discharge: 2011-02-12 | Discharge status: Against Medical Advice | Payer: Medicaid Other | Attending: Emergency Medicine | Admitting: Emergency Medicine

## 2011-02-12 DIAGNOSIS — R000 Tachycardia, unspecified: Secondary | ICD-10-CM | POA: Insufficient documentation

## 2011-02-12 DIAGNOSIS — F3289 Other specified depressive episodes: Secondary | ICD-10-CM | POA: Insufficient documentation

## 2011-02-12 DIAGNOSIS — J439 Emphysema, unspecified: Secondary | ICD-10-CM | POA: Insufficient documentation

## 2011-02-12 DIAGNOSIS — F329 Major depressive disorder, single episode, unspecified: Secondary | ICD-10-CM

## 2011-02-12 DIAGNOSIS — F17200 Nicotine dependence, unspecified, uncomplicated: Secondary | ICD-10-CM | POA: Insufficient documentation

## 2011-02-12 DIAGNOSIS — R45851 Suicidal ideations: Secondary | ICD-10-CM | POA: Insufficient documentation

## 2011-02-12 DIAGNOSIS — F1010 Alcohol abuse, uncomplicated: Secondary | ICD-10-CM | POA: Insufficient documentation

## 2011-02-12 MED ORDER — SODIUM CHLORIDE 0.9 % IV BOLUS (SEPSIS): 1000.0000 mL | Freq: Once | INTRAVENOUS | Status: DC

## 2011-02-12 MED ORDER — LORAZEPAM 1 MG PO TABS
1.0000 mg | ORAL_TABLET | Freq: Once | ORAL | Status: AC
  Administered 2011-02-12: 1 mg via ORAL
  Filled 2011-02-12: qty 1

## 2011-02-12 MED ORDER — ACETAMINOPHEN 325 MG PO TABS: 650.0000 mg | ORAL_TABLET | ORAL | Status: DC | PRN

## 2011-02-12 MED ORDER — THIAMINE HCL 100 MG/ML IJ SOLN: 100.0000 mg | Freq: Every day | INTRAMUSCULAR | Status: DC

## 2011-02-12 MED ORDER — LORAZEPAM 2 MG/ML IJ SOLN: 1.0000 mg | Freq: Four times a day (QID) | INTRAMUSCULAR | Status: DC | PRN

## 2011-02-12 MED ORDER — LORAZEPAM 1 MG PO TABS: 1.0000 mg | ORAL_TABLET | Freq: Three times a day (TID) | ORAL | Status: DC | PRN

## 2011-02-12 MED ORDER — NICOTINE 21 MG/24HR TD PT24: 21.0000 mg | MEDICATED_PATCH | Freq: Every day | TRANSDERMAL | Status: DC

## 2011-02-12 MED ORDER — VITAMIN B-1 100 MG PO TABS: 100.0000 mg | ORAL_TABLET | Freq: Every day | ORAL | Status: DC

## 2011-02-12 MED ORDER — ALUM & MAG HYDROXIDE-SIMETH 200-200-20 MG/5ML PO SUSP: 30.0000 mL | ORAL | Status: DC | PRN

## 2011-02-12 MED ORDER — IBUPROFEN 200 MG PO TABS: 600.0000 mg | ORAL_TABLET | Freq: Three times a day (TID) | ORAL | Status: DC | PRN

## 2011-02-12 MED ORDER — ONDANSETRON HCL 4 MG/2ML IJ SOLN: 4.0000 mg | Freq: Once | INTRAMUSCULAR | Status: DC

## 2011-02-12 MED ORDER — FOLIC ACID 1 MG PO TABS: 1.0000 mg | ORAL_TABLET | Freq: Every day | ORAL | Status: DC

## 2011-02-12 MED ORDER — THERA M PLUS PO TABS: 1.0000 | ORAL_TABLET | Freq: Every day | ORAL | Status: DC

## 2011-02-12 MED ORDER — ZOLPIDEM TARTRATE 5 MG PO TABS: 5.0000 mg | ORAL_TABLET | Freq: Every evening | ORAL | Status: DC | PRN

## 2011-02-12 MED ORDER — LORAZEPAM 1 MG PO TABS: 1.0000 mg | ORAL_TABLET | Freq: Four times a day (QID) | ORAL | Status: DC | PRN

## 2011-02-12 NOTE — ED Provider Notes (Signed)
Medical screening examination/treatment/procedure(s) were performed by non-physician practitioner and as supervising physician I was immediately available for consultation/collaboration.   Joya Gaskins, MD 02/12/11 [id number]

## 2011-02-12 NOTE — ED Notes (Signed)
Pt continues to be missing from RM 3 demographic sheet given to [employer] as requested

## 2011-02-12 NOTE — ED Notes (Signed)
Pt not in room after telling [name] with [facility] that she is suicidal, pt had denied SI to RN on assessment. Pt was given Ativan and reassessed pulse rate a few minutes prior to event, notified CN Becky RN of incident.

## 2011-02-12 NOTE — ED Notes (Addendum)
Requesting detox and to be placed back on meds, pt has been on a 7 day Alcohol binge. No SI or HI

## 2011-02-12 NOTE — ED Provider Notes (Signed)
History     CSN: [id number] Arrival date & time: 02/12/2011 10:01 AM   First MD Initiated Contact with Patient 02/12/11 1004      Chief Complaint  Patient presents with  . V70.1    Pt seeking assistance to detox from alcohol  . Depression   HPI Patient presents to the emergency room with complaint of needing detox and depression. Denies any SI/HI. Patient denies any nausea, vomiting or withdrawal symptoms. Patient denies any history of DTs or seizures. Patient reports that she went through detox 1.5 months ago for the same problem. Denies any other drug abuse. Denies any hallucinations. Patient denies any other complaints.   Past Medical History  Diagnosis Date  . Asthma   . Emphysema     History reviewed. No pertinent past surgical history.  No family history on file.  History  Substance Use Topics  . Smoking status: Current Everyday Smoker    Types: Cigarettes  . Smokeless tobacco: Not on file  . Alcohol Use: Yes     has been on 7 days drinking binge    OB History    Grav Para Term Preterm Abortions TAB SAB Ect Mult Living                  Review of Systems  Constitutional: Negative for fever, chills, diaphoresis and appetite change.  HENT: Negative for neck pain.   Eyes: Negative for photophobia and visual disturbance.  Respiratory: Negative for cough, chest tightness and shortness of breath.   Cardiovascular: Negative for chest pain.  Gastrointestinal: Negative for nausea, vomiting and abdominal pain.  Genitourinary: Negative for flank pain.  Musculoskeletal: Negative for back pain.  Skin: Negative for rash.  Neurological: Negative for dizziness, weakness, light-headedness and numbness.  Psychiatric/Behavioral: Negative for suicidal ideas, hallucinations, behavioral problems, confusion, sleep disturbance, self-injury, dysphoric mood, decreased concentration and agitation. The patient is not nervous/anxious and is not hyperactive.   All other systems reviewed  and are negative.    Allergies  Eggs or egg-derived products; Milk-related compounds; and Sulfa antibiotics  Home Medications   Current Outpatient Rx  Name Route Sig Dispense Refill  . GABAPENTIN 600 MG PO TABS Oral Take 600 mg by mouth 4 (four) times daily.      . IBUPROFEN 200 MG PO TABS Oral Take 600 mg by mouth as needed. For pain    . SERTRALINE HCL 100 MG PO TABS Oral Take 100 mg by mouth every morning.     Marland Kitchen CLONAZEPAM 0.5 MG PO TABS Oral Take 2 mg by mouth 4 (four) times daily.      . QUETIAPINE FUMARATE 100 MG PO TABS Oral Take 300 mg by mouth at bedtime.        BP 130/83  Pulse 122  Temp(Src) 97.9 F (36.6 C) (Oral)  Resp 20  Wt 145 lb (65.772 kg)  SpO2 98%  Physical Exam  Nursing note and vitals reviewed. Constitutional: She is oriented to person, place, and time. She appears well-developed and well-nourished.  HENT:  Head: Normocephalic and atraumatic.  Eyes: EOM are normal. Pupils are equal, round, and reactive to light.  Neck: Normal range of motion. Neck supple.  Cardiovascular: Regular rhythm.        tachycardic  Pulmonary/Chest: Effort normal and breath sounds normal. No respiratory distress. She has no wheezes.  Abdominal: Soft. Bowel sounds are normal. There is no tenderness.  Musculoskeletal: Normal range of motion. She exhibits no tenderness.  Neurological: She is alert and  oriented to person, place, and time. She exhibits normal muscle tone. Coordination normal.  Skin: Skin is warm and dry. No rash noted. No erythema. No pallor.  Psychiatric:       Flat affect    ED Course  Procedures (including critical care time)  Patient seen and evaluated.  VSS reviewed. . Nursing notes reviewed. Discussed with attending physician, Dr. Bebe Shaggy. Advised that the patient does not need inpatient detox. Will monitor the patient closely. They agree with the treatment plan and diagnosis.   11:23 AM Discussed with [facility] team, they will come see the patient to give  some outpatient resources. Social worker is also going to come talk to the patient as well. Stressed importance of her following up with her psychiatrist.  Patient Vitals for the past 24 hrs:  BP Temp Temp src Pulse Resp SpO2 Weight  02/12/11 1110 139/84 mmHg - - 118  20  99 % -  02/12/11 1001 130/83 mmHg 97.9 F (36.6 C) Oral 122  20  98 % 145 lb (65.772 kg)    11:50 AM [facility] TEAM and social worker came and talked to the patient, reports that she now has SI. Patient states she needs help.  11:56 AM notified by dr. Bebe Shaggy that the patient has left the building. We have called security to find the patient because of the SI.  MDM  Depression Alcohol abuse SI        Demetrius Charity, PA 02/12/11 1156

## 2011-04-03 IMAGING — CR DG HUMERUS 2V *R*
3 series · 3 of 3 positions shown · non-contrast
Comparison: Right shoulder dated 07/23/2003.

CLINICAL DATA: Mid right upper arm pain following a fall last
night.

RIGHT HUMERUS - 2+ VIEW

[w humerus ap right * (1 of 2)]
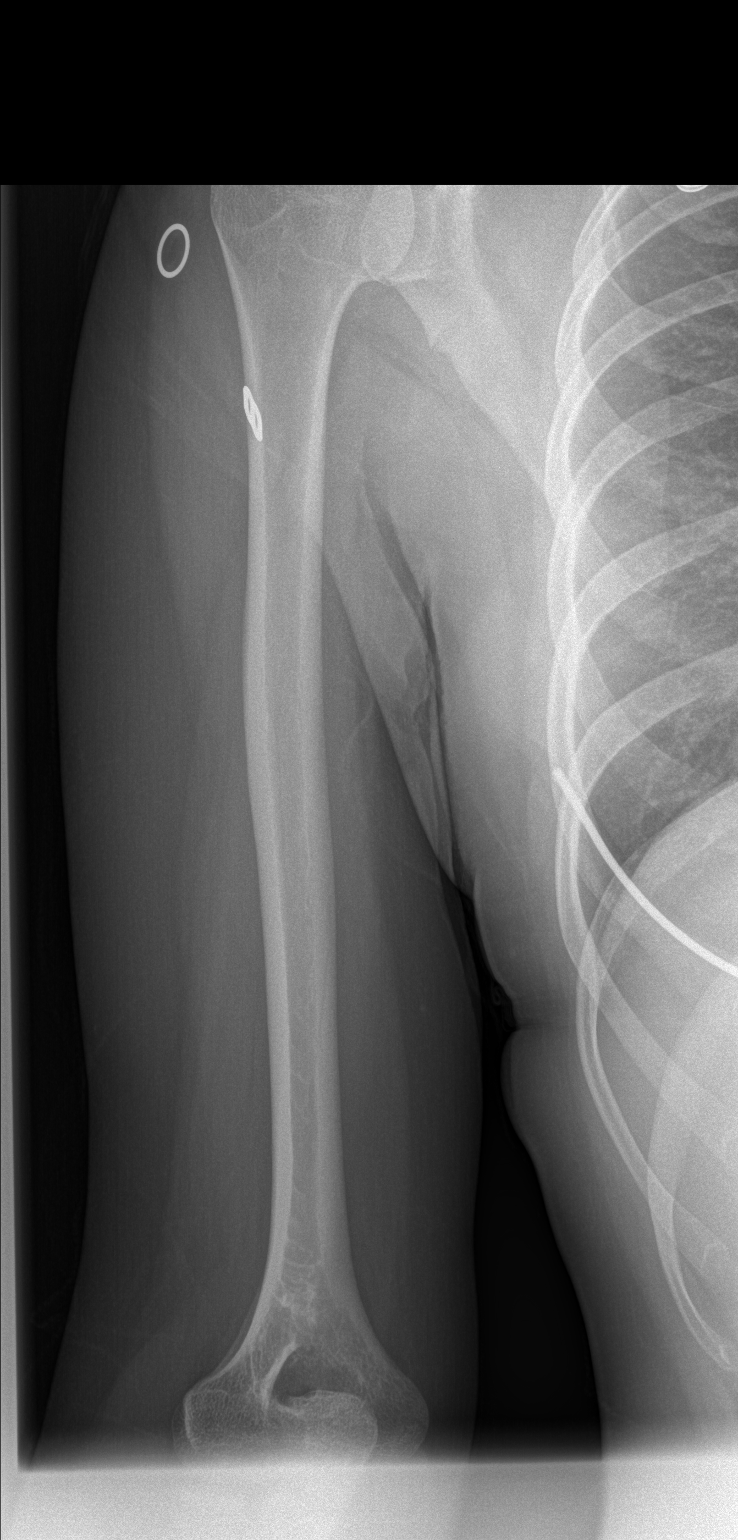

[w humerus lat right *]
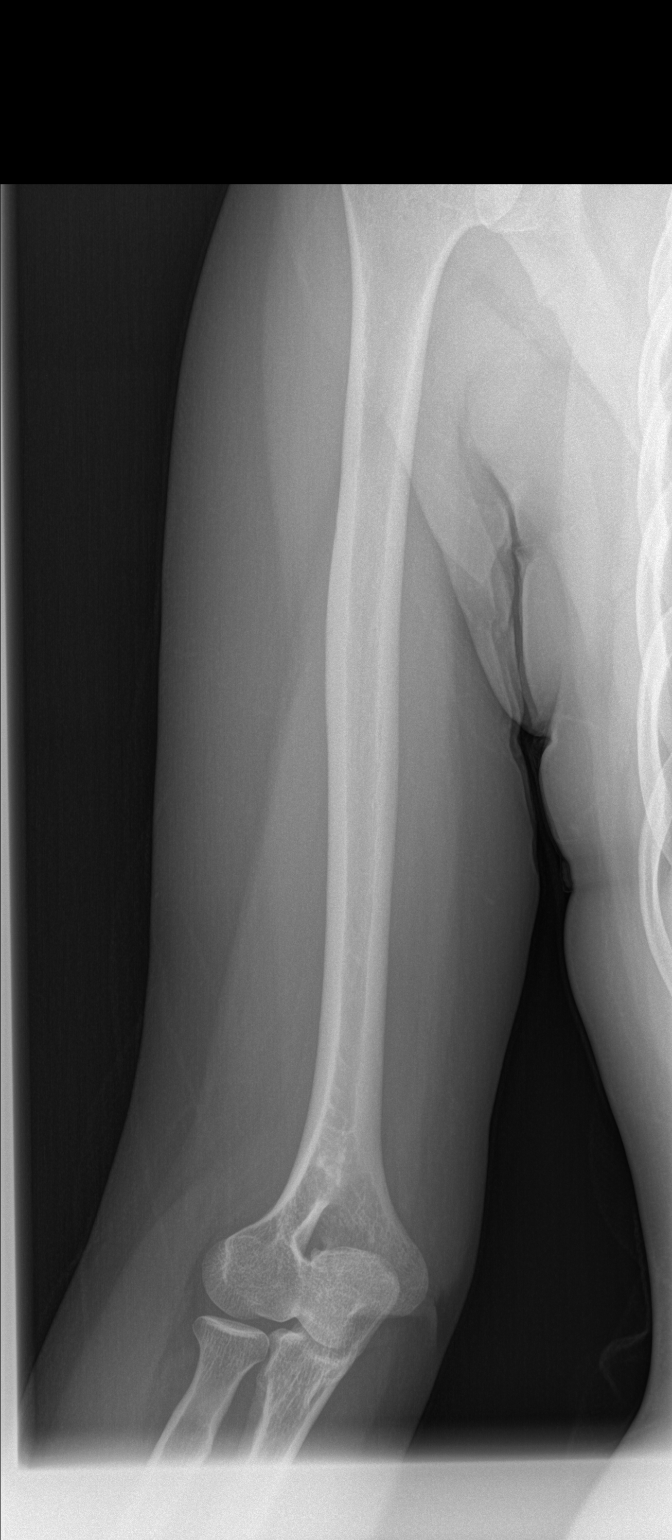

[w humerus ap right * (2 of 2)]
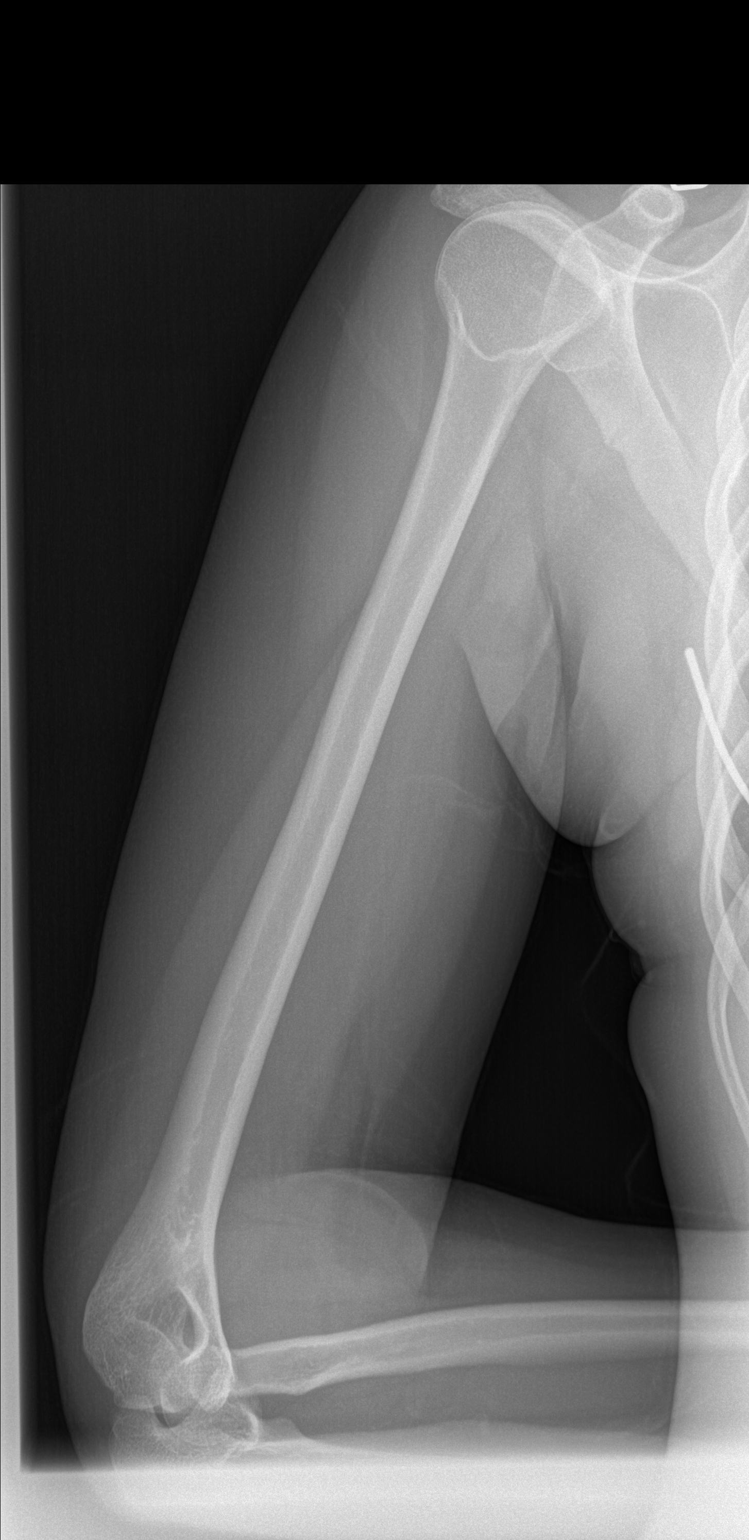

[3 of 3 positions shown; findings below may reference images not displayed]

FINDINGS: Normal appearing bones and soft tissues without fracture
or dislocation.
IMPRESSION: Normal examination.

## 2011-04-06 ENCOUNTER — Encounter (HOSPITAL_COMMUNITY): Payer: Self-pay

## 2011-04-06 ENCOUNTER — Emergency Department (HOSPITAL_COMMUNITY)
Admission: EM | Admit: 2011-04-06 | Discharge: 2011-04-06 | Disposition: A | Discharge status: Home / Self Care | Payer: Self-pay | Attending: Emergency Medicine | Admitting: Emergency Medicine

## 2011-04-06 ENCOUNTER — Other Ambulatory Visit: Payer: Self-pay

## 2011-04-06 DIAGNOSIS — R51 Headache: Secondary | ICD-10-CM | POA: Insufficient documentation

## 2011-04-06 DIAGNOSIS — Z79899 Other long term (current) drug therapy: Secondary | ICD-10-CM | POA: Insufficient documentation

## 2011-04-06 DIAGNOSIS — J438 Other emphysema: Secondary | ICD-10-CM | POA: Insufficient documentation

## 2011-04-06 DIAGNOSIS — F411 Generalized anxiety disorder: Secondary | ICD-10-CM | POA: Insufficient documentation

## 2011-04-06 DIAGNOSIS — J45909 Unspecified asthma, uncomplicated: Secondary | ICD-10-CM | POA: Insufficient documentation

## 2011-04-06 DIAGNOSIS — R002 Palpitations: Secondary | ICD-10-CM | POA: Insufficient documentation

## 2011-04-06 DIAGNOSIS — F17200 Nicotine dependence, unspecified, uncomplicated: Secondary | ICD-10-CM | POA: Insufficient documentation

## 2011-04-06 LAB — BASIC METABOLIC PANEL
Chloride: 104 mEq/L (ref 96–112)
GFR calc non Af Amer: >90 mL/min (ref >90)
Glucose, Bld: 97 mg/dL (ref 70–99)
Potassium: 4.1 mEq/L (ref 3.5–5.1)
Sodium: 138 mEq/L (ref 135–145)

## 2011-04-06 MED ORDER — LORAZEPAM 1 MG PO TABS
1.0000 mg | ORAL_TABLET | Freq: Once | ORAL | Status: AC
  Administered 2011-04-06: 1 mg via ORAL
  Filled 2011-04-06: qty 1

## 2011-04-06 NOTE — ED Notes (Signed)
Pt presently denies any visual problems.

## 2011-04-06 NOTE — ED Notes (Signed)
Pt states feeling better at this time. Pain states chest pain is a 6/10 at this time. NAD noted.

## 2011-04-06 NOTE — ED Notes (Signed)
ekg shown to Dr. Estell Harpin and made him aware of pt's complaints.

## 2011-04-06 NOTE — ED Notes (Signed)
Pt says in waiting room felt palpitations again.  Reassessed pt, pt says is not presently having palpitations.   Radial pulse present and regular at this time.
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
## 2011-04-06 NOTE — ED Provider Notes (Signed)
History     CSN: [id number]  Arrival date & time 04/06/11  1702   First MD Initiated Contact with Patient 04/06/11 1825      No chief complaint on file.   (Consider location/radiation/quality/duration/timing/severity/associated sxs/prior treatment) The history is provided by the patient.   patient here with 3 month history of right-sided headache with intermittent blurred vision. Symptoms became worse after she stopped taking her Neurontin. No vomiting or fever. Increased stress anxiety noted. Patient denies any chest pain chest pressure. Has had some intermittent palpitations. Patient has a history of depression and she is off all her medications at this time. Denies any suicidal homicidal ideations. Has used Motrin without relief of her symptoms. Is unsure what makes her symptoms worse. Patient also notes pain in her neck which is worse with movement, no paresthesias. She notes intermittent blurred vision associate with her headaches. She does have a history of migraines  Past Medical History  Diagnosis Date  . Asthma   . Emphysema     History reviewed. No pertinent past surgical history.  No family history on file.  History  Substance Use Topics  . Smoking status: Current Everyday Smoker    Types: Cigarettes  . Smokeless tobacco: Not on file  . Alcohol Use: Yes     occasionally    OB History    Grav Para Term Preterm Abortions TAB SAB Ect Mult Living                  Review of Systems  All other systems reviewed and are negative.    Allergies  Eggs or egg-derived products; Milk-related compounds; and Sulfa antibiotics  Home Medications   Current Outpatient Rx  Name Route Sig Dispense Refill  . CLONAZEPAM 0.5 MG PO TABS Oral Take 2 mg by mouth 4 (four) times daily.      Marland Kitchen GABAPENTIN 600 MG PO TABS Oral Take 600 mg by mouth 4 (four) times daily.      . IBUPROFEN 200 MG PO TABS Oral Take 600 mg by mouth as needed. For pain    . QUETIAPINE FUMARATE 100 MG PO TABS  Oral Take 300 mg by mouth at bedtime.      . SERTRALINE HCL 100 MG PO TABS Oral Take 100 mg by mouth every morning.       BP 132/93  Pulse 83  Temp(Src) 98 F (36.7 C) (Oral)  Resp 16  Ht 5\' 6"  (1.676 m)  Wt 147 lb (66.679 kg)  BMI 23.73 kg/m2  SpO2 100%  Physical Exam  Nursing note and vitals reviewed. Constitutional: She is oriented to person, place, and time. Vital signs are normal. She appears well-developed and well-nourished.  Non-toxic appearance. No distress.  HENT:  Head: Normocephalic and atraumatic.  Eyes: Conjunctivae, EOM and lids are normal. Pupils are equal, round, and reactive to light.  Neck: Normal range of motion. Neck supple. No tracheal deviation present. No mass present.  Cardiovascular: Normal rate, regular rhythm and normal heart sounds.  Exam reveals no gallop.   No murmur heard. Pulmonary/Chest: Effort normal and breath sounds normal. No stridor. No respiratory distress. She has no decreased breath sounds. She has no wheezes. She has no rhonchi. She has no rales.  Abdominal: Soft. Normal appearance and bowel sounds are normal. She exhibits no distension. There is no tenderness. There is no rebound and no CVA tenderness.  Musculoskeletal: Normal range of motion. She exhibits no edema and no tenderness.  Neurological: She is alert  and oriented to person, place, and time. She has normal strength. No cranial nerve deficit or sensory deficit. GCS eye subscore is 4. GCS verbal subscore is 5. GCS motor subscore is 6.  Skin: Skin is warm and dry. No abrasion and no rash noted.  Psychiatric: Her speech is normal and behavior is normal. Her affect is blunt. Thought content is not delusional. She exhibits a depressed mood. She expresses no suicidal plans and no homicidal plans.    ED Course  Procedures (including critical care time)   Labs Reviewed  BASIC METABOLIC PANEL   No results found.   No diagnosis found.    MDM   Date: 04/06/2011  Rate: 84   Rhythm: normal sinus rhythm  QRS Axis: normal  Intervals: normal  ST/T Wave abnormalities: normal  Conduction Disutrbances:none  Narrative Interpretation:   Old EKG Reviewed: none available  9:03 PM Suspect that patients baseline depression is cause of her current symptoms. She has been diagnosed with fibromyalgia in the past. She'll be given a referral to her primary care Dr. and carries to restart her Neurontin if possible          Toy Baker, MD 04/06/11 2104

## 2011-04-26 ENCOUNTER — Emergency Department (HOSPITAL_COMMUNITY): Payer: No Typology Code available for payment source

## 2011-04-26 ENCOUNTER — Emergency Department (HOSPITAL_COMMUNITY)
Admission: EM | Admit: 2011-04-26 | Discharge: 2011-04-26 | Disposition: A | Discharge status: Home / Self Care | Payer: No Typology Code available for payment source | Attending: Emergency Medicine | Admitting: Emergency Medicine

## 2011-04-26 ENCOUNTER — Encounter (HOSPITAL_COMMUNITY): Payer: Self-pay | Admitting: Emergency Medicine

## 2011-04-26 DIAGNOSIS — M545 Low back pain, unspecified: Secondary | ICD-10-CM | POA: Insufficient documentation

## 2011-04-26 DIAGNOSIS — R109 Unspecified abdominal pain: Secondary | ICD-10-CM | POA: Insufficient documentation

## 2011-04-26 DIAGNOSIS — J438 Other emphysema: Secondary | ICD-10-CM | POA: Insufficient documentation

## 2011-04-26 DIAGNOSIS — S301XXA Contusion of abdominal wall, initial encounter: Secondary | ICD-10-CM | POA: Insufficient documentation

## 2011-04-26 DIAGNOSIS — M549 Dorsalgia, unspecified: Secondary | ICD-10-CM

## 2011-04-26 DIAGNOSIS — R51 Headache: Secondary | ICD-10-CM | POA: Insufficient documentation

## 2011-04-26 HISTORY — DX: Depression, unspecified: F32.A

## 2011-04-26 HISTORY — DX: Anxiety disorder, unspecified: F41.9

## 2011-04-26 HISTORY — DX: Chronic obstructive pulmonary disease, unspecified: J44.9

## 2011-04-26 HISTORY — DX: Fibromyalgia: M79.7

## 2011-04-26 HISTORY — DX: Major depressive disorder, single episode, unspecified: F32.9

## 2011-04-26 LAB — POCT I-STAT, CHEM 8
Chloride: 106 mEq/L (ref 96–112)
Glucose, Bld: 113 mg/dL — ABNORMAL HIGH (ref 70–99)
HCT: 42 % (ref 36.0–46.0)
Potassium: 4.2 mEq/L (ref 3.5–5.1)

## 2011-04-26 MED ORDER — KETOROLAC TROMETHAMINE 30 MG/ML IJ SOLN
INTRAMUSCULAR | Status: AC
  Administered 2011-04-26: 30 mg
  Filled 2011-04-26: qty 1

## 2011-04-26 MED ORDER — IOHEXOL 300 MG/ML  SOLN
80.0000 mL | Freq: Once | INTRAMUSCULAR | Status: AC | PRN
  Administered 2011-04-26: 80 mL via INTRAVENOUS

## 2011-04-26 MED ORDER — GABAPENTIN 600 MG PO TABS: 600.0000 mg | ORAL_TABLET | Freq: Three times a day (TID) | ORAL | Status: DC
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
## 2011-05-02 ENCOUNTER — Encounter (HOSPITAL_COMMUNITY): Payer: Self-pay

## 2011-05-02 ENCOUNTER — Emergency Department (INDEPENDENT_AMBULATORY_CARE_PROVIDER_SITE_OTHER)
Admission: EM | Admit: 2011-05-02 | Discharge: 2011-05-02 | Disposition: A | Discharge status: Home / Self Care | Payer: Self-pay | Source: Home / Self Care

## 2011-05-02 DIAGNOSIS — S139XXA Sprain of joints and ligaments of unspecified parts of neck, initial encounter: Secondary | ICD-10-CM

## 2011-05-02 DIAGNOSIS — S39012A Strain of muscle, fascia and tendon of lower back, initial encounter: Secondary | ICD-10-CM

## 2011-05-02 DIAGNOSIS — S335XXA Sprain of ligaments of lumbar spine, initial encounter: Secondary | ICD-10-CM

## 2011-05-02 DIAGNOSIS — S161XXA Strain of muscle, fascia and tendon at neck level, initial encounter: Secondary | ICD-10-CM

## 2011-05-02 MED ORDER — CYCLOBENZAPRINE HCL 5 MG PO TABS: 5.0000 mg | ORAL_TABLET | Freq: Three times a day (TID) | ORAL | Status: AC | PRN

## 2011-05-02 MED ORDER — MELOXICAM 7.5 MG PO TABS: 7.5000 mg | ORAL_TABLET | Freq: Every day | ORAL | Status: DC

## 2011-05-02 MED ORDER — HYDROCODONE-ACETAMINOPHEN 5-325 MG PO TABS: 1.0000 | ORAL_TABLET | Freq: Four times a day (QID) | ORAL | Status: AC | PRN
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
## 2011-05-03 NOTE — ED Provider Notes (Signed)
Medical screening examination/treatment/procedure(s) were performed by non-physician practitioner and as supervising physician I was immediately available for consultation/collaboration.   Analucia Hush DOUGLAS MD.    Jamisen Duerson Douglas Carrol Bondar, MD 05/03/11 1121 

## 2011-05-16 ENCOUNTER — Emergency Department (HOSPITAL_COMMUNITY)
Admission: EM | Admit: 2011-05-16 | Discharge: 2011-05-16 | Disposition: A | Discharge status: Home Health | Payer: Self-pay | Source: Home / Self Care

## 2011-05-16 ENCOUNTER — Encounter (HOSPITAL_COMMUNITY): Payer: Self-pay | Admitting: Emergency Medicine

## 2011-05-16 DIAGNOSIS — S8991XA Unspecified injury of right lower leg, initial encounter: Secondary | ICD-10-CM

## 2011-05-16 MED ORDER — HYDROCODONE-ACETAMINOPHEN 5-325 MG PO TABS: 1.0000 | ORAL_TABLET | Freq: Four times a day (QID) | ORAL | Status: AC | PRN

## 2011-05-16 NOTE — Discharge Instructions (Signed)
Knee Effusion °The medical term for having fluid in your knee is effusion. This is often due to an internal derangement of the knee. This means something is wrong inside the knee. Some of the causes of fluid in the knee may be torn cartilage, a torn ligament, or bleeding into the joint from an injury. Your knee is likely more difficult to bend and move. This is often because there is increased pain and pressure in the joint. The time it takes for recovery from a knee effusion depends on different factors, including:  °· Type of injury.  °· Your age.  °· Physical and medical conditions.  °· Rehabilitation Strategies.  °How long you will be away from your normal activities will depend on what kind of knee problem you have and how much damage is present. Your knee has two types of cartilage. Articular cartilage covers the bone ends and lets your knee bend and move smoothly. Two menisci, thick pads of cartilage that form a rim inside the joint, help absorb shock and stabilize your knee. Ligaments bind the bones together and support your knee joint. Muscles move the joint, help support your knee, and take stress off the joint itself. °CAUSES  °Often an effusion in the knee is caused by an injury to one of the menisci. This is often a tear in the cartilage. Recovery after a meniscus injury depends on how much meniscus is damaged and whether you have damaged other knee tissue. Small tears may heal on their own with conservative treatment. Conservative means rest, limited weight bearing activity and muscle strengthening exercises. Your recovery may take up to 6 weeks.  °TREATMENT  °Larger tears may require surgery. Meniscus injuries may be treated during arthroscopy. Arthroscopy is a procedure in which your surgeon uses a small telescope like instrument to look in your knee. Your caregiver can make a more accurate diagnosis (learning what is wrong) by performing an arthroscopic procedure. °If your injury is on the inner  margin of the meniscus, your surgeon may trim the meniscus back to a smooth rim. In other cases your surgeon will try to repair a damaged meniscus with stitches (sutures). This may make rehabilitation take longer, but may provide better long term result by helping your knee keep its shock absorption capabilities. °Ligaments which are completely torn usually require surgery for repair. °HOME CARE INSTRUCTIONS °· Use crutches as instructed.  °· If a brace is applied, use as directed.  °· Once you are home, an ice pack applied to your swollen knee may help with discomfort and help decrease swelling.  °· Keep your knee raised (elevated) when you are not up and around or on crutches.  °· Only take over-the-counter or prescription medicines for pain, discomfort, or fever as directed by your caregiver.  °· Your caregivers will help with instructions for rehabilitation of your knee. This often includes strengthening exercises.  °· You may resume a normal diet and activities as directed.  °SEEK MEDICAL CARE IF:  °· There is increased swelling in your knee.  °· You notice redness, swelling, or increasing pain in your knee.  °· An unexplained oral temperature above 102° F (38.9° C) develops.  °SEEK IMMEDIATE MEDICAL CARE IF:  °· You develop a rash.  °· You have difficulty breathing.  °· You have any allergic reactions from medications you may have been given.  °· There is severe pain with any motion of the knee.  °MAKE SURE YOU:  °· Understand these instructions.  °·   Will watch your condition.  °· Will get help right away if you are not doing well or get worse.  °Document Released: 06/04/2003 Document Revised: 11/24/2010 Document Reviewed: 08/08/2007 °[employer]® Patient Information ©2012 [employer]. °

## 2011-05-16 NOTE — ED Notes (Signed)
PT HERE WITH RIGHT KNEE DEEP BURNING,THROB PAIN S/P INJURY Saturday.PT STATES SHE SLIPPED ON WOODEN/ICY PORCH AND FELL ON KNEE.TRIED IBUPROFEN,ICE AND SLEEVE FOR COMFORT.SWELLING AND REDNESS SEEN

## 2011-05-17 ENCOUNTER — Encounter: Payer: Self-pay | Admitting: Sports Medicine

## 2011-05-17 ENCOUNTER — Ambulatory Visit (INDEPENDENT_AMBULATORY_CARE_PROVIDER_SITE_OTHER): Payer: Self-pay | Admitting: Sports Medicine

## 2011-05-17 VITALS — BP 141/102 | HR 80

## 2011-05-17 DIAGNOSIS — M25569 Pain in unspecified knee: Secondary | ICD-10-CM

## 2011-05-17 DIAGNOSIS — M25561 Pain in right knee: Secondary | ICD-10-CM | POA: Insufficient documentation

## 2011-05-17 MED ORDER — AMITRIPTYLINE HCL 50 MG PO TABS: ORAL_TABLET | ORAL | Status: DC

## 2011-05-17 MED ORDER — MELOXICAM 15 MG PO TABS: ORAL_TABLET | ORAL | Status: DC

## 2011-05-17 NOTE — H&P (Signed)
[name] is a 36 y.o. female who presents to Urgent Care today for Right knee pain after slipping on ice 3 days ago.  She was outside on her front porch when her Right leg slipped.  She is unable to qualify exact mechanism of fall but does report immediate pain.  Difficulty bearing weight.  Describes burning sensation beneath patella.  Has been using rest and ice for relief.  Most pain relief come with leg extension, more pain with flexion and bearing weight.  No history of this in past.  Has noted some swelling.  No other joint injuries in fall.    PMH reviewed.  ROS as above otherwise neg Medications reviewed. No current facility-administered medications for this encounter.   Current Outpatient Prescriptions  Medication Sig Dispense Refill  . gabapentin (NEURONTIN) 100 MG capsule Take 100 mg by mouth 3 (three) times daily.      Marland Kitchen amitriptyline (ELAVIL) 50 MG tablet One half tab PO qHS for a week, then one tab PO qHS.  30 tablet  3  . HYDROcodone-acetaminophen (NORCO) 5-325 MG per tablet Take 1 tablet by mouth every 6 (six) hours as needed for pain.  15 tablet  0  . meloxicam (MOBIC) 15 MG tablet One tab PO qAM with breakfast for 2 weeks, then daily prn pain.  30 tablet  3  . DISCONTD: meloxicam (MOBIC) 7.5 MG tablet Take 1 tablet (7.5 mg total) by mouth daily.  30 tablet  0    Exam:  BP 147/108  Pulse 82  Temp(Src) 98.1 F (36.7 C) (Oral)  Resp 18  SpO2 98% Gen: Well NAD HEENT: EOMI,  MMM Lungs: CTABL Nl WOB Heart: RRR no MRG Abd: NABS, NT, ND Exts: Non edematous BL  LE, warm and well perfused.  Right knee with Tenderness to palpation along medial joint line.  Difficulty bearing weight.  Ant and post drawer negative.  Good active and passive ROM in knee.  Good ROM in hip.  Questionable effusion noted.  Lachmann's negative.   McMurray's negative.    Assessment and Plan: 1.  Possible Knee sprain:  No joint laxity on exam.  No red flags on exam.  Questionable effusion by  palpation.  Do not think she injured meniscus or ligaments, however plan referral to Sports Medicine (she is without insurance) for further recommendations and evaluation.  AS she is in pain when she bears weight, recommend immobilizer, crutches, Norco short-term for pain relief.    ** Of note, this is late entry to chart.  I was unable to locate her note in Epic until this evening and it was not sent to my Inbox

## 2011-05-17 NOTE — H&P (Signed)
Medical screening examination/treatment/procedure(s) were performed by PGY-3 FM resident and as supervising physician I was immediately available for consultation/collaboration.   Ailed Defibaugh Moreno-Coll, MD  

## 2011-05-17 NOTE — Patient Instructions (Signed)
Great to see you [name],  Leg lifts 3 sets of 30 nightly. May bear weight as tolerated. Come back in 2-3 weeks.   [name]. Benjamin Stain, M.D. [facility] [address] [place] [address] [phone]

## 2011-05-17 NOTE — Assessment & Plan Note (Addendum)
Suspect sprain knee. Doubt meniscal injury. Amitryptiline and mobic. Knee sleeve. HEP. RTC 2-3 weeks. MRI versus diagnostic/therapeutic injection if no better.

## 2011-05-17 NOTE — Progress Notes (Signed)
  Subjective:    Patient ID: [name], female    DOB: 1975-04-24, 36 y.o.   MRN: [id number]  HPI Labrenda had an unfortunate incident, about 3 days ago where she slipped and fell, twisting her knee. She went to the urgent care center on Monday, and was diagnosed with an effusion, and sent here for further evaluation. She was given some Norco. She already takes gabapentin for fibromyalgia. She localizes her pain to the anterior knee just at the inferior pole of the patella. She describes it as a burning type sensation. She notes that her gabapentin does not help this at all. She's had trouble sleeping due to the pain.  Past medical history, surgical history, family history, social history, allergies, and medications reviewed from the medical record and no changes needed.   Review of Systems    No fevers, chills, night sweats, weight loss, chest pain, or shortness of breath.  Social History: Non-smoker. Objective:   Physical Exam General:  Well developed, well nourished, and in no acute distress. Neuro:  Alert and oriented x3, extra-ocular muscles intact. Skin: Warm and dry, no rashes noted. Respiratory:  Not using accessory muscles, speaking in full sentences. Musculoskeletal: Right Knee: Normal to inspection with no erythema or effusion or obvious bony abnormalities. She does have a small amount of swelling anteriorly. No erythema or induration. Tenderness to palpation at the inferior pole of the patella, as well as over the patellar tendon. ROM full in flexion and extension and lower leg rotation. Strength 5 out of 5 to flexion and extension. Ligaments with solid consistent endpoints including ACL, PCL, LCL, MCL. Negative Mcmurray's, Apley's, and Thessalonian tests. Non painful patellar compression. Patellar glide without crepitus. Patellar and quadriceps tendons unremarkable. Hamstring and quadriceps strength is normal.   MS[facility] ultrasound: No effusion present in suprapatellar  recess. Medial and lateral menisci unremarkable. Patellar tendon intact, and without any edema, tears, splits. Images saved.   Assessment & Plan:

## 2011-05-24 IMAGING — CR DG CHEST 2V
2 series · 2 of 2 positions shown · non-contrast
Comparison: 04/24/2008 and earlier.

CLINICAL DATA: 33-year-old female with shortness of breath.
Smoker.

CHEST - 2 VIEW

[w chest pa]
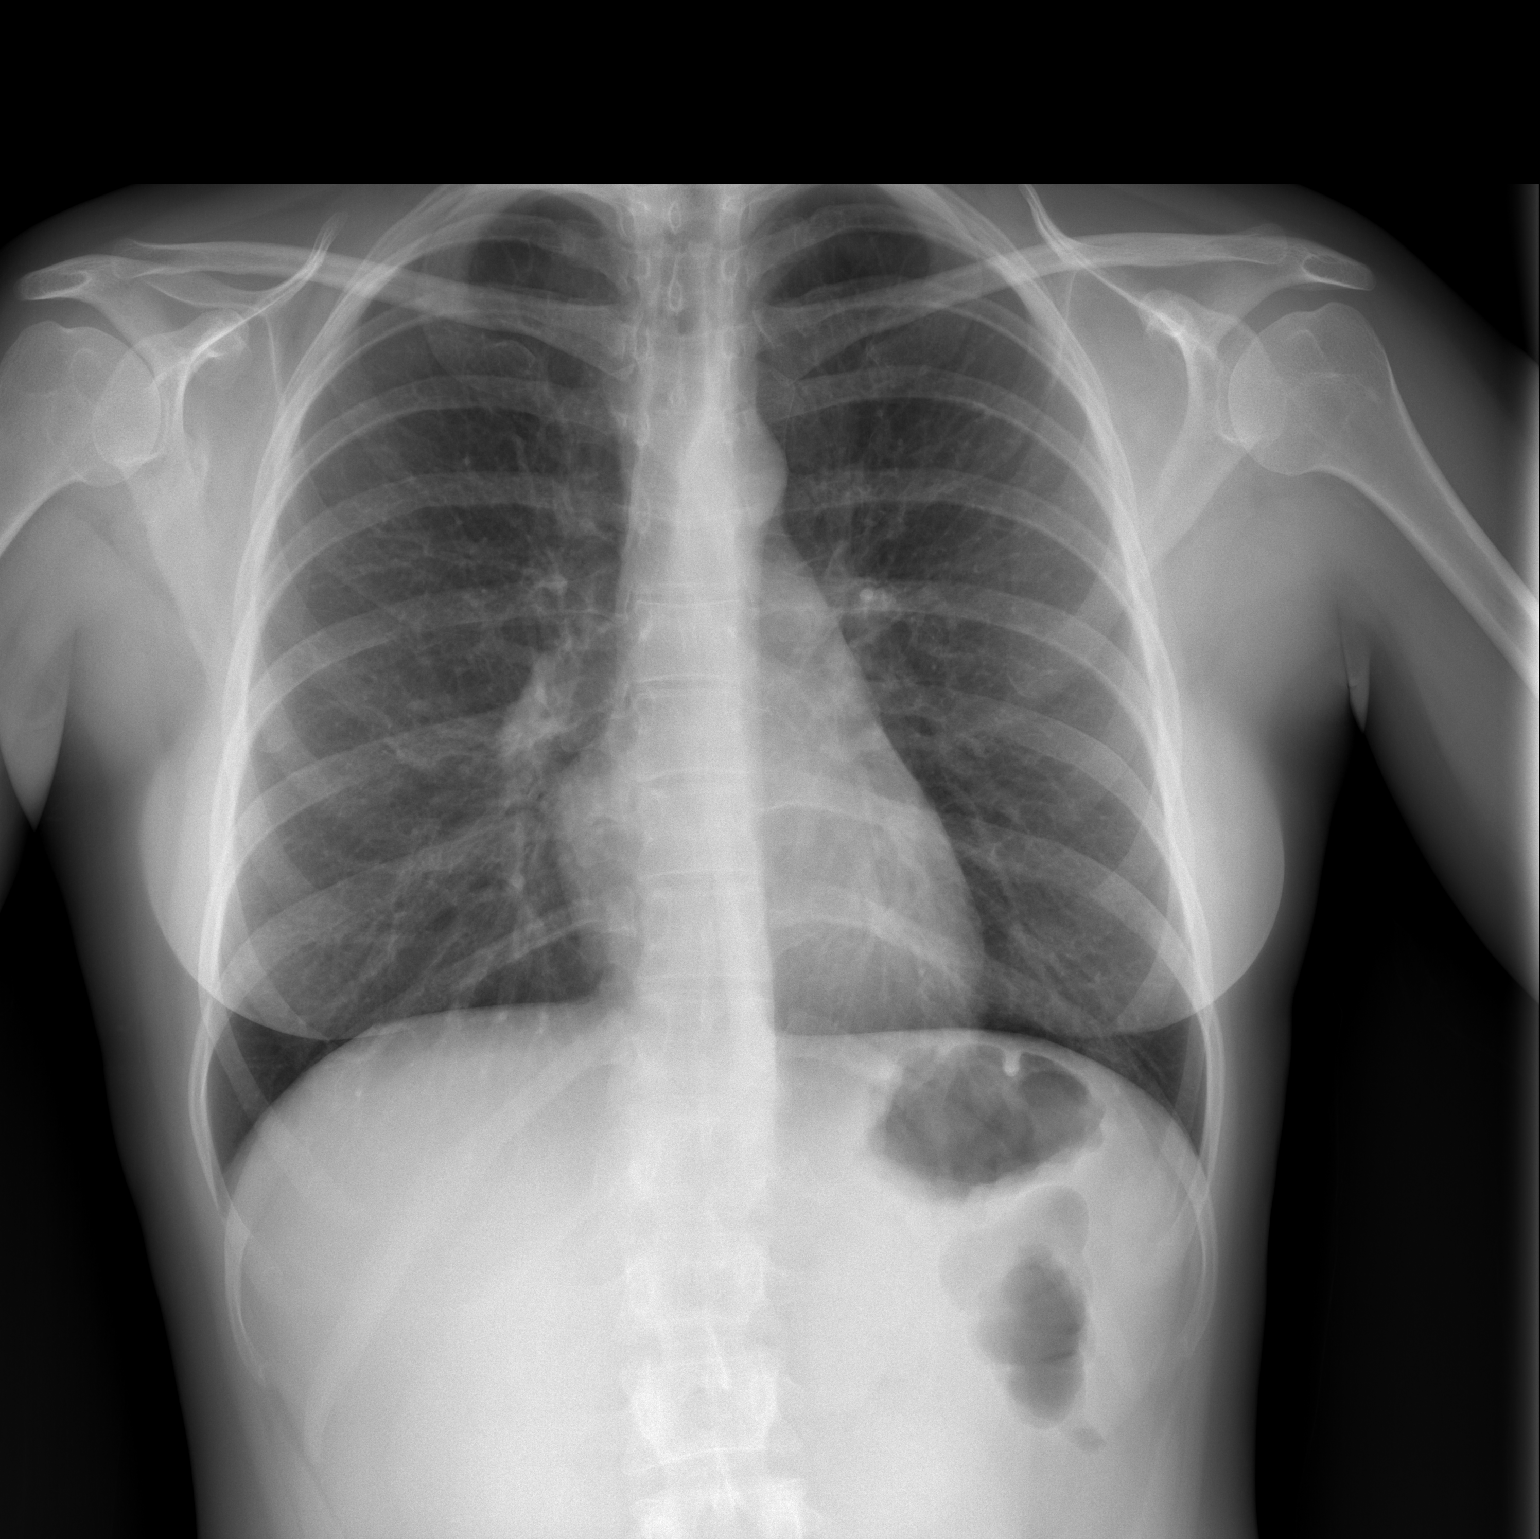

[w chest lat]
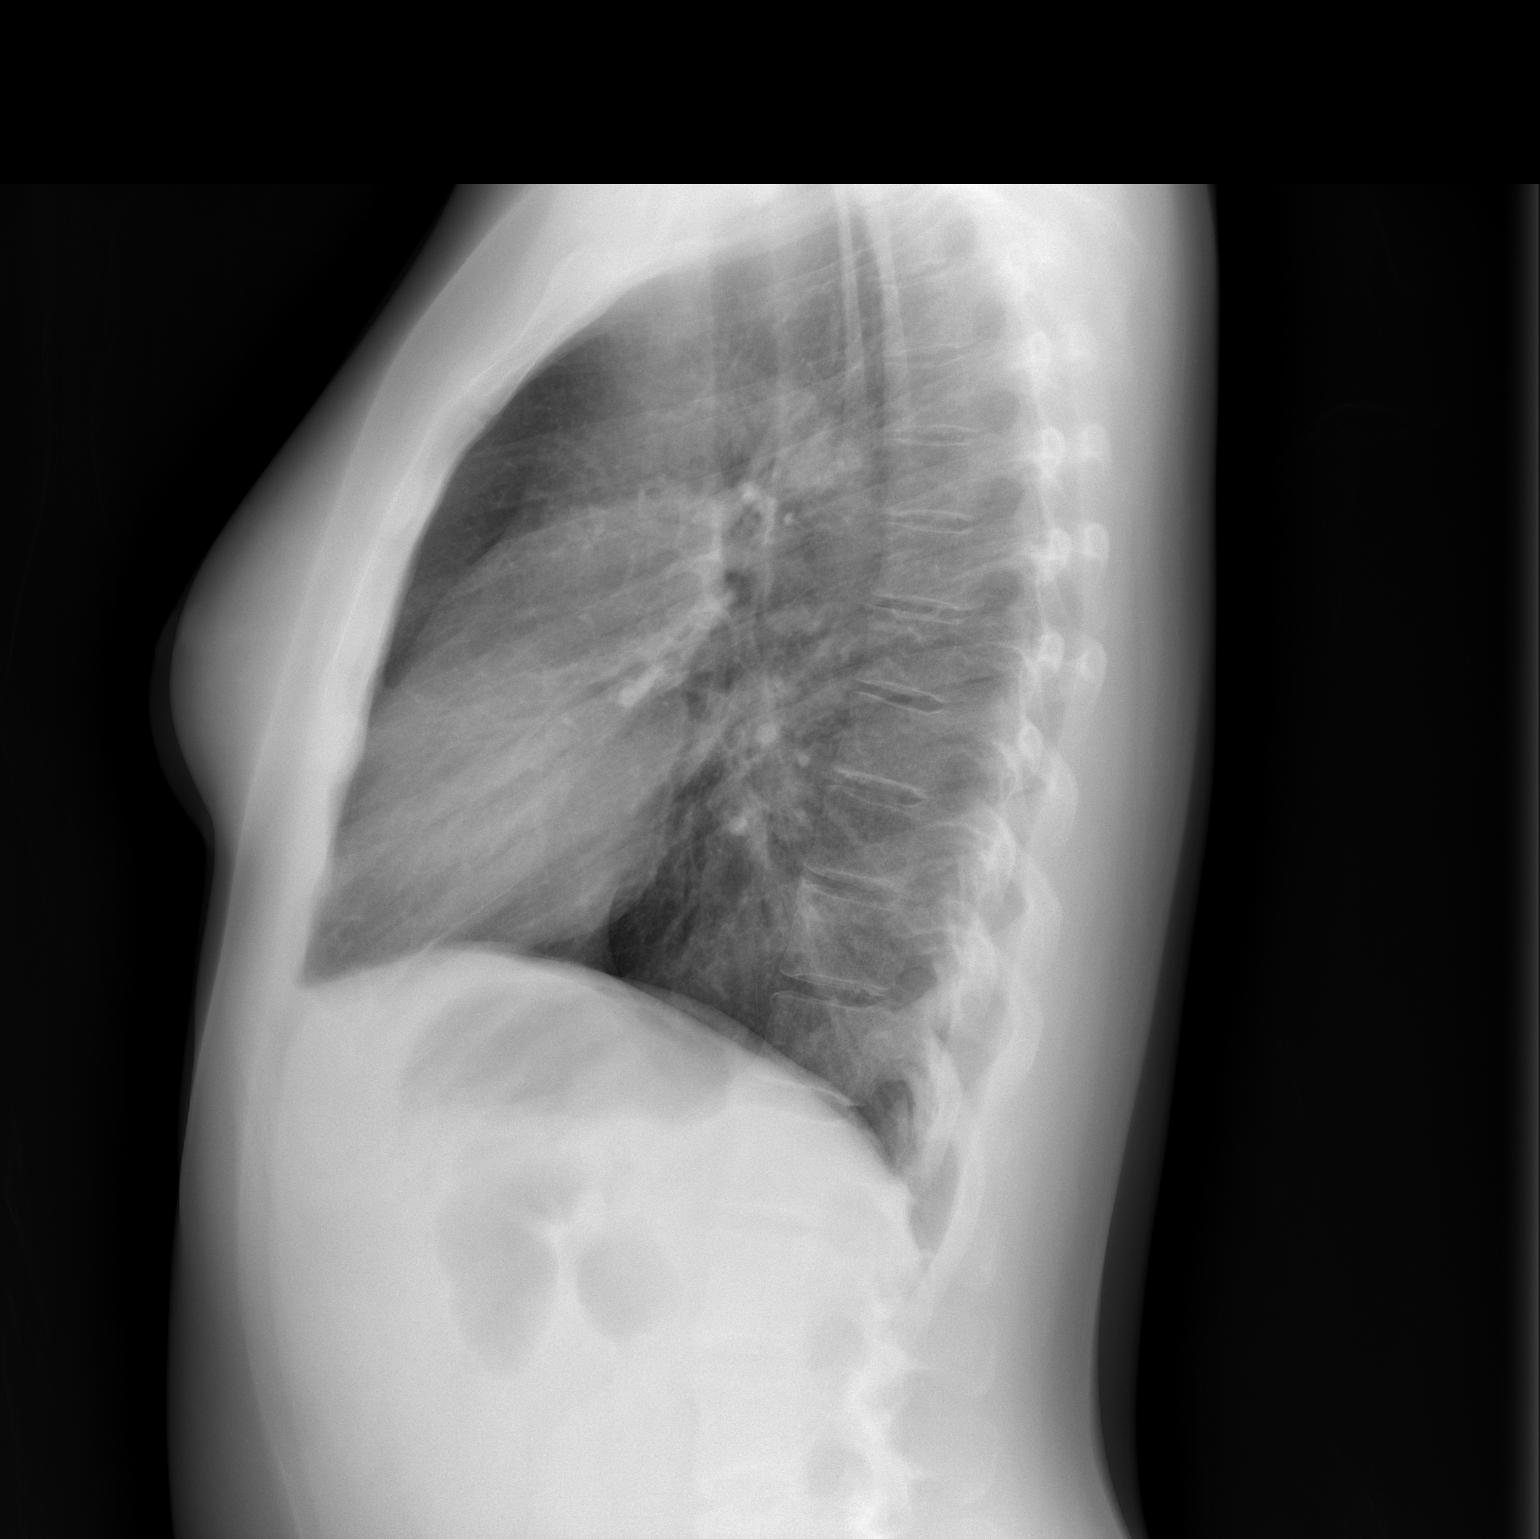

[2 of 2 positions shown; findings below may reference images not displayed]

FINDINGS: Stable lung volumes. Normal cardiac size and mediastinal
contours.  The lungs remain clear. No acute osseous abnormality
identified.  Visualized tracheal air column is within normal
limits.
IMPRESSION: No acute cardiopulmonary abnormality.

## 2011-05-31 ENCOUNTER — Ambulatory Visit: Payer: No Typology Code available for payment source | Admitting: Sports Medicine

## 2011-11-30 IMAGING — US US ABDOMEN COMPLETE
1 series · 14 of 25 positions shown · non-contrast
Comparison: 08/20/2007 and CT 01/20/2005.  The

CLINICAL DATA: Right upper quadrant pain.

COMPLETE ABDOMINAL ULTRASOUND

[Series 1: us abdomen complete · 0.26mm/px · 14 of 93 slices shown]
[im 1/93]
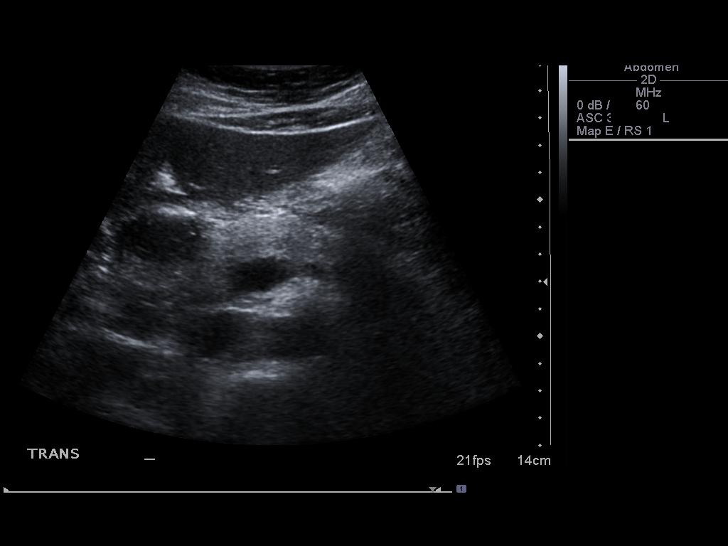
[im 8/93]
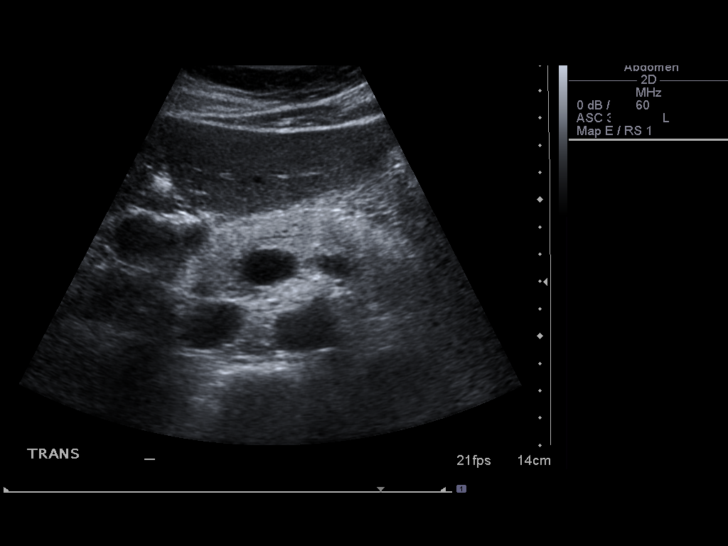
[im 16/93]
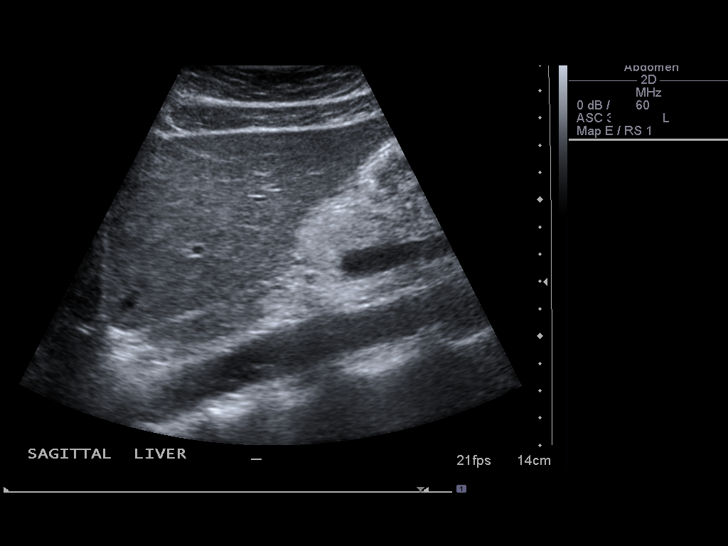
[im 24/93]
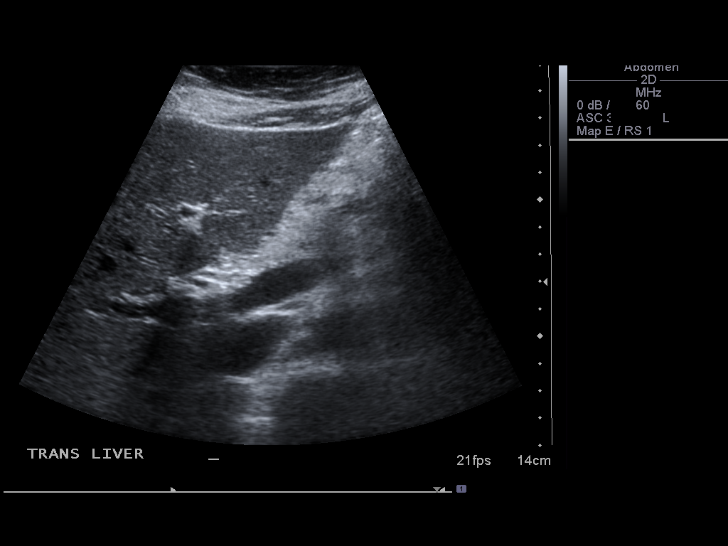
[im 31/93]
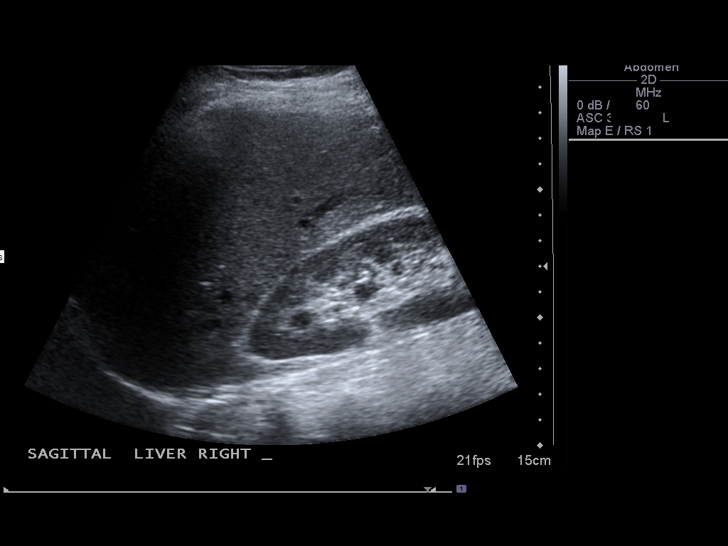
[im 35/93]
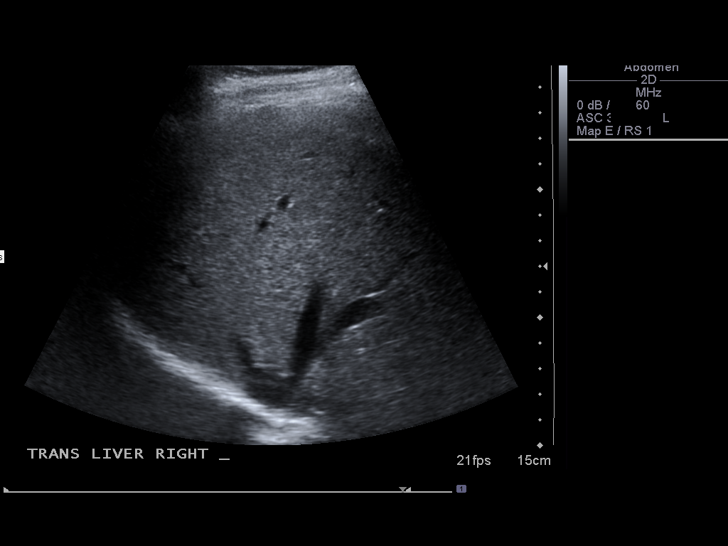
[im 43/93]
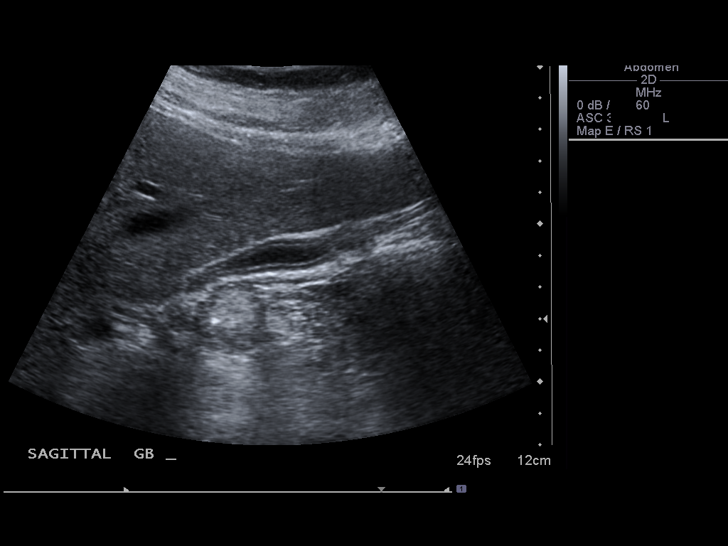
[im 50/93]
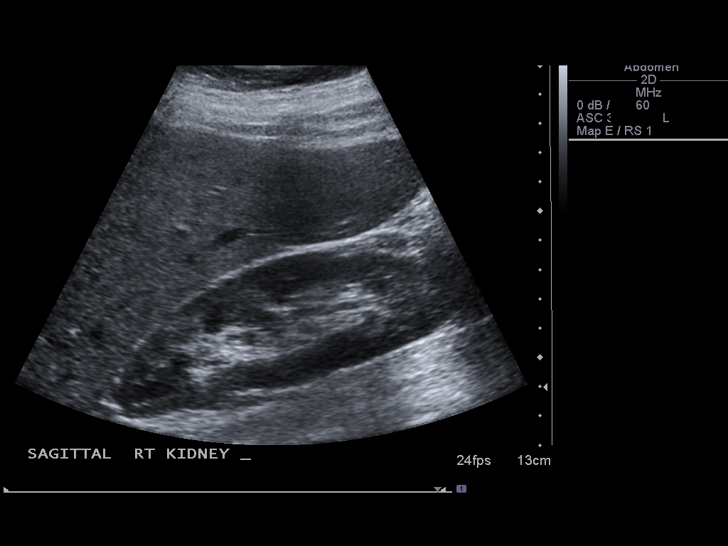
[im 58/93]
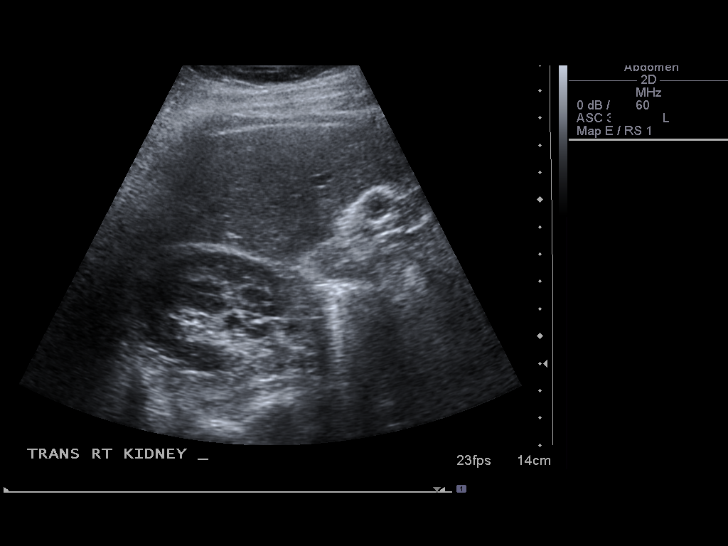
[im 62/93]
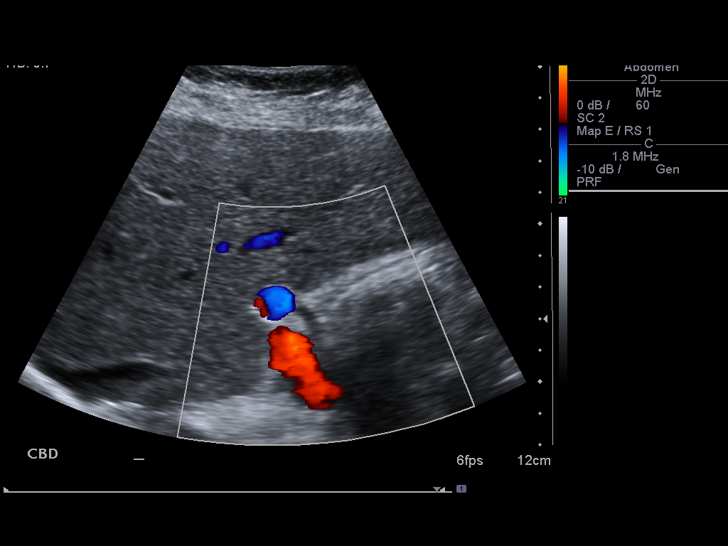
[im 70/93]
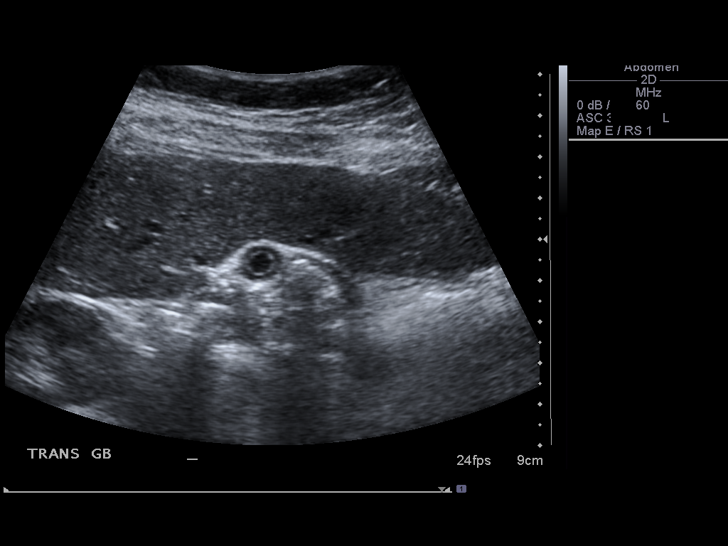
[im 77/93]
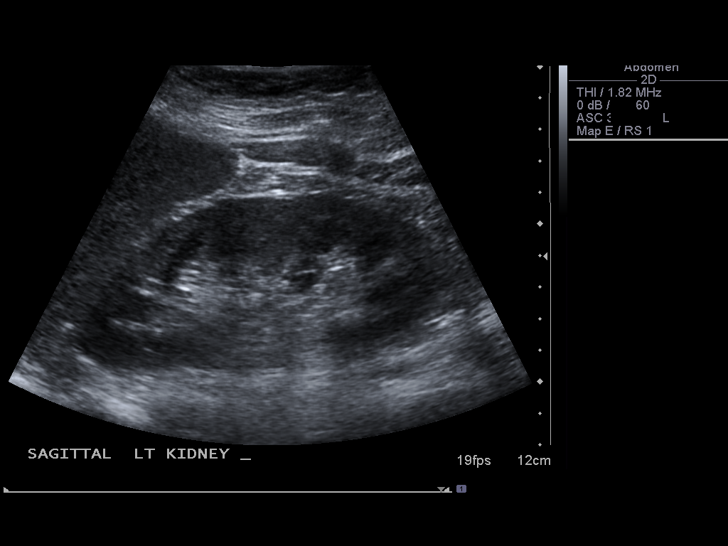
[im 85/93]
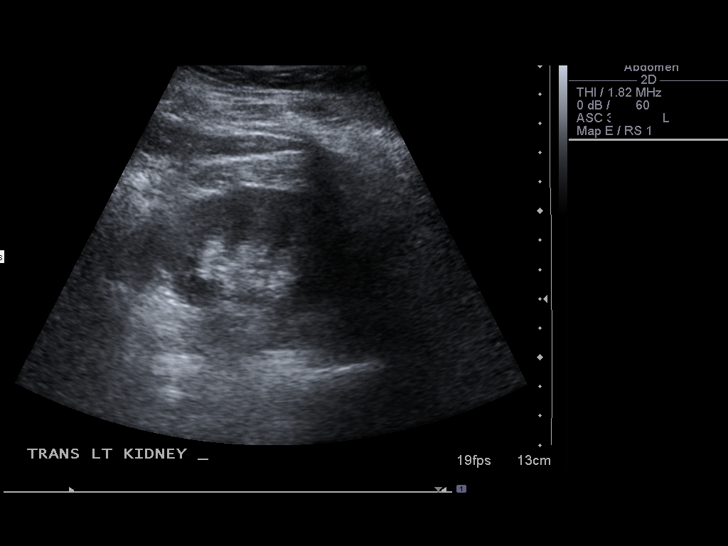
[im 93/93]
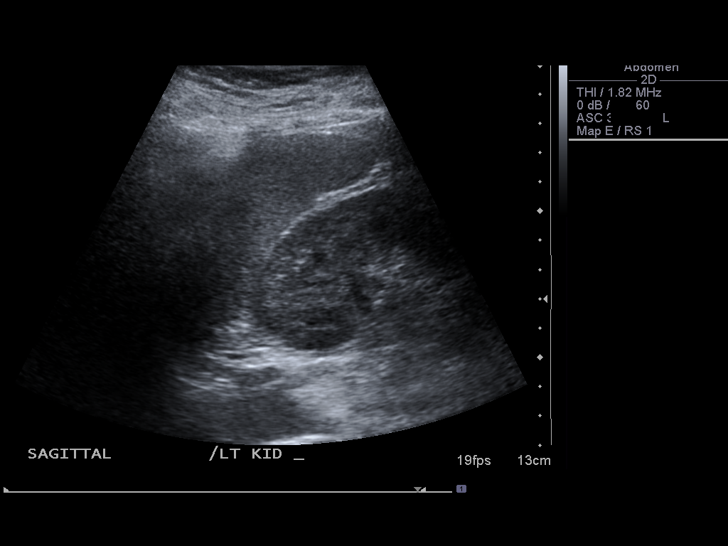

[14 of 25 positions shown; findings below may reference images not displayed]

FINDINGS: Gallbladder:  The patient ate breakfast 4 hours prior to the study.
The gallbladder is contracted.  No definite gallstones are present.
Follow-up ultrasound gallbladder is suggested to evaluate for
gallstones as the gallbladder is not  adequately dilated for
accurate evaluation.

Common bile duct:  4.1 mm.

Liver:  No focal lesion identified.  Within normal limits in
parenchymal echogenicity.

IVC:  Appears normal.

Pancreas:  No focal abnormality seen.

Spleen:  Measures

Right Kidney:  8 cm in length without focal abnormality.

Left Kidney:  12.1 cm in length without abnormality.

Abdominal aorta:  11.4 cm in length without abnormality.
IMPRESSION: Contracted gallbladder   prevents a thorough evaluation of the
gallbladder.  No definite stones or biliary obstruction.  Follow-up
ultrasound gallbladder is suggested following n.p.o. 6 hours.

## 2011-11-30 IMAGING — US US ABDOMEN LIMITED
1 series · 14 of 17 positions shown · non-contrast
Comparison: [HOSPITAL] abdominal ultrasound 04/10/2009.

CLINICAL DATA: Post prandial contraction gallbladder on abdominal
ultrasound 04/10/2009.  Follow-up fasting assessment of
gallbladder.

LIMITED ABDOMINAL ULTRASOUND

[Series 1: us abdomen limited · 0.23mm/px · 14 of 17 slices shown]
[im 1/17]
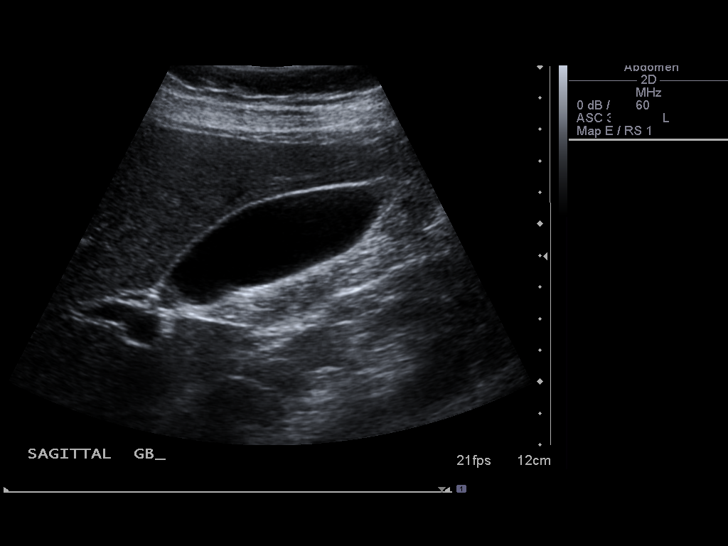
[im 2/17]
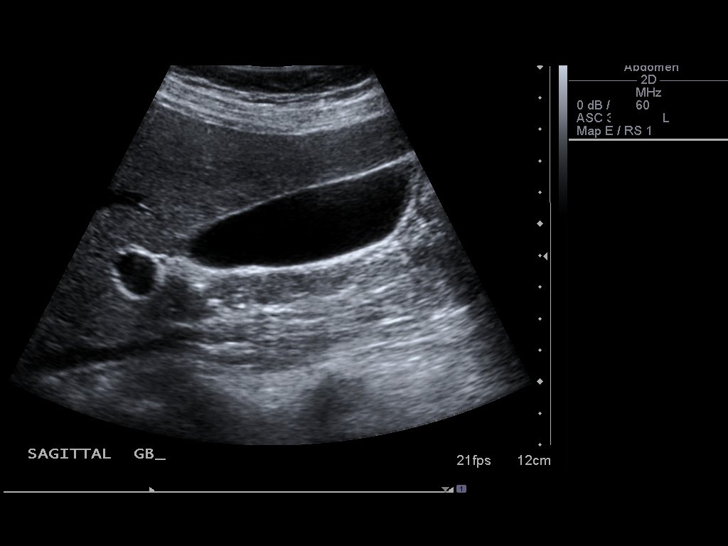
[im 4/17]
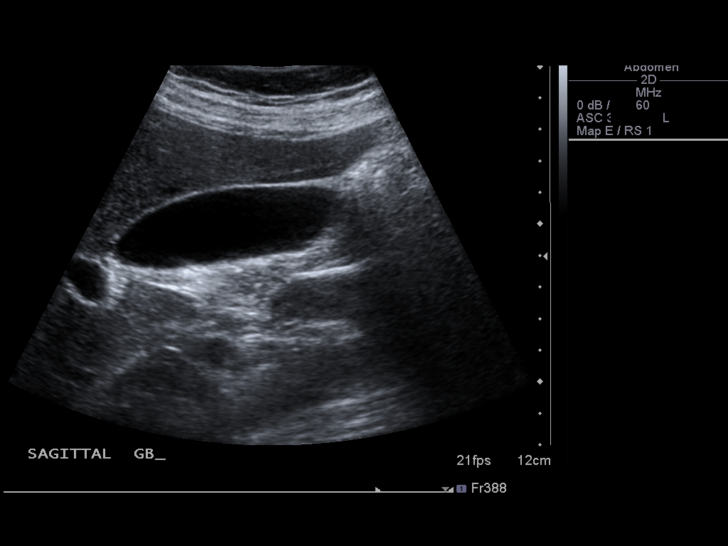
[im 5/17]
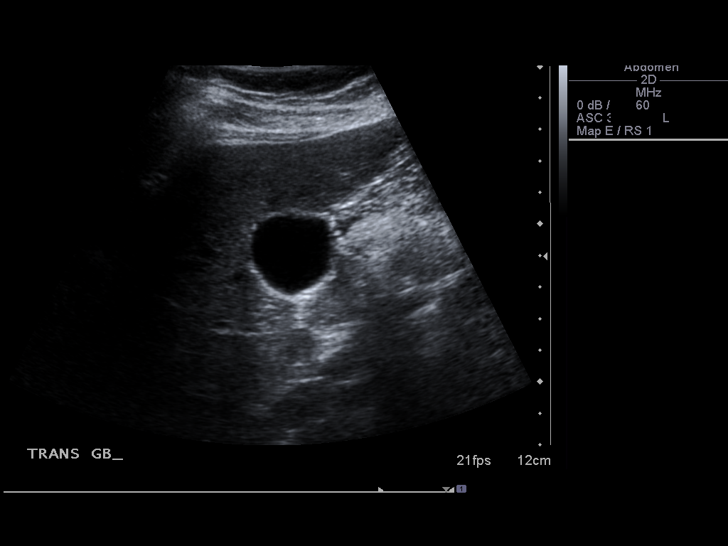
[im 6/17]
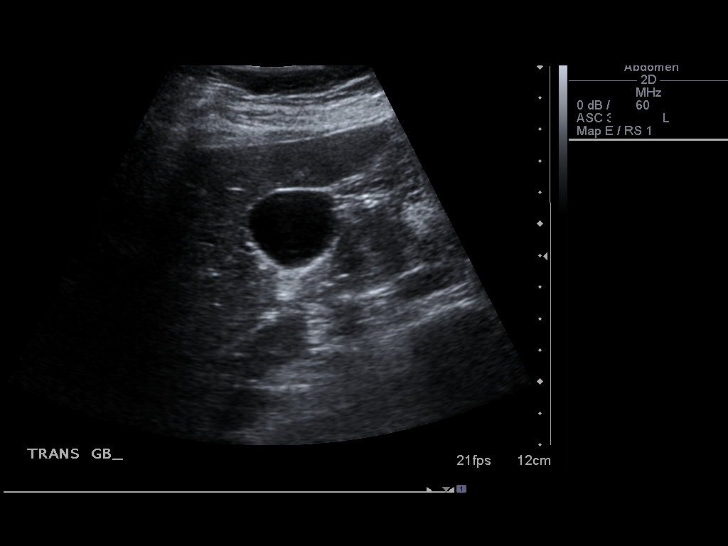
[im 7/17]
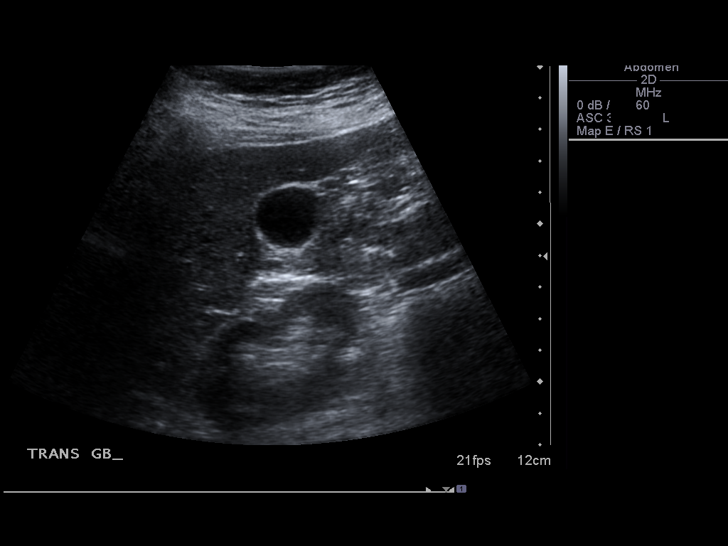
[im 8/17]
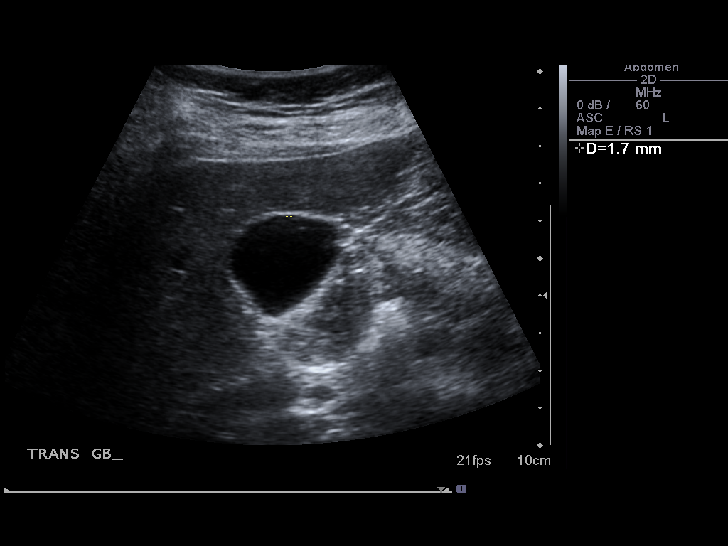
[im 10/17]
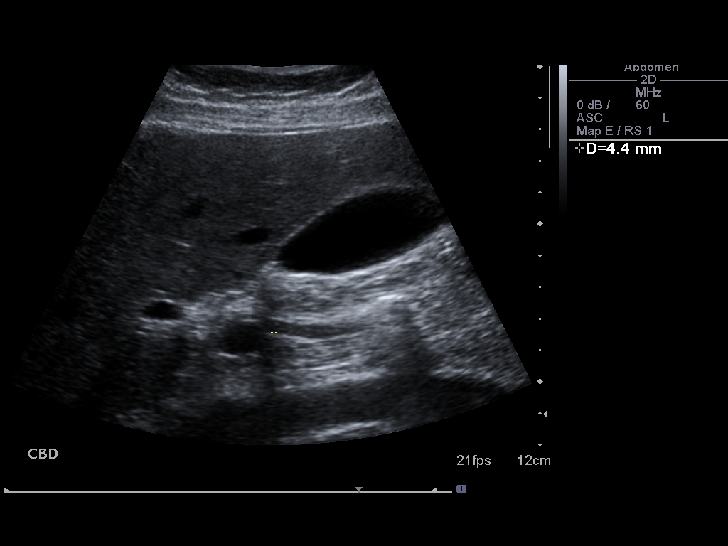
[im 11/17]
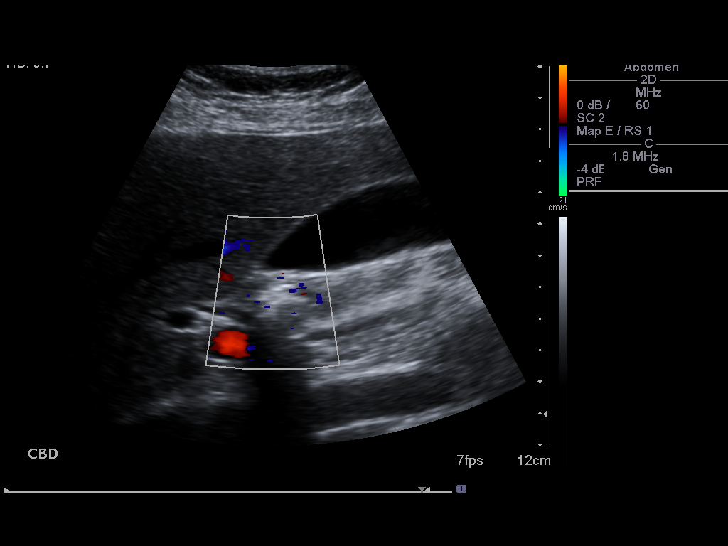
[im 12/17]
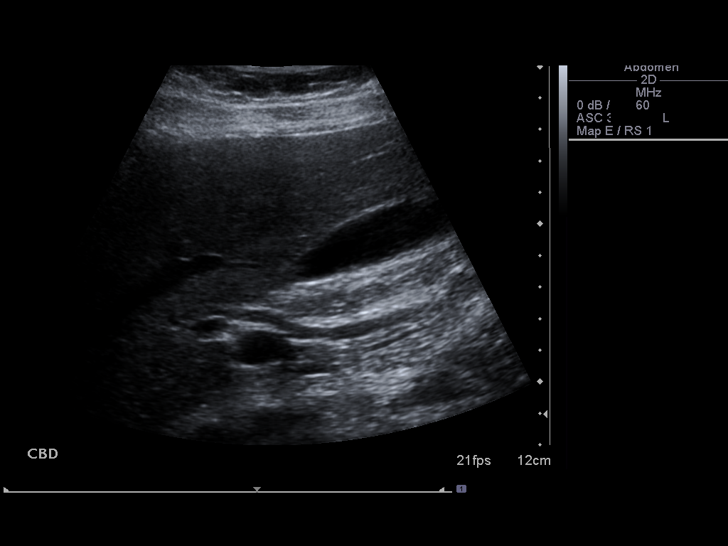
[im 13/17]
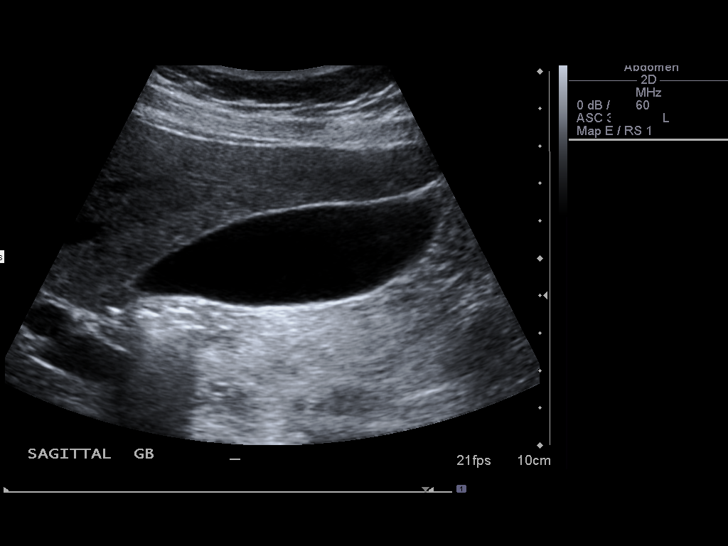
[im 14/17]
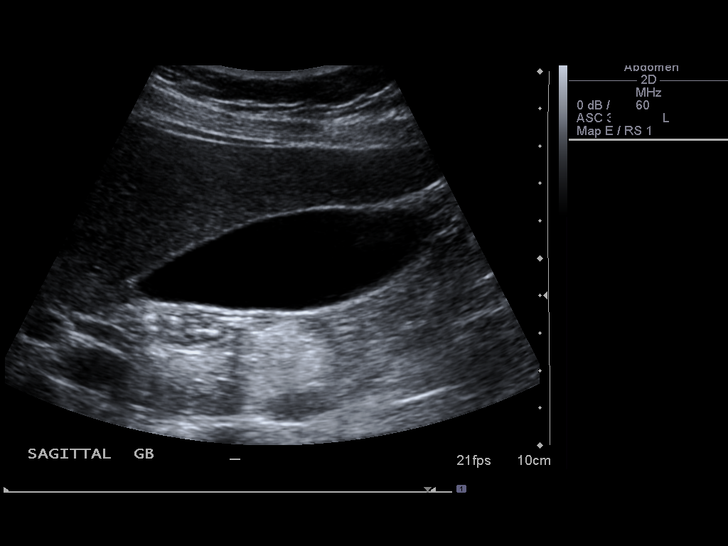
[im 16/17]
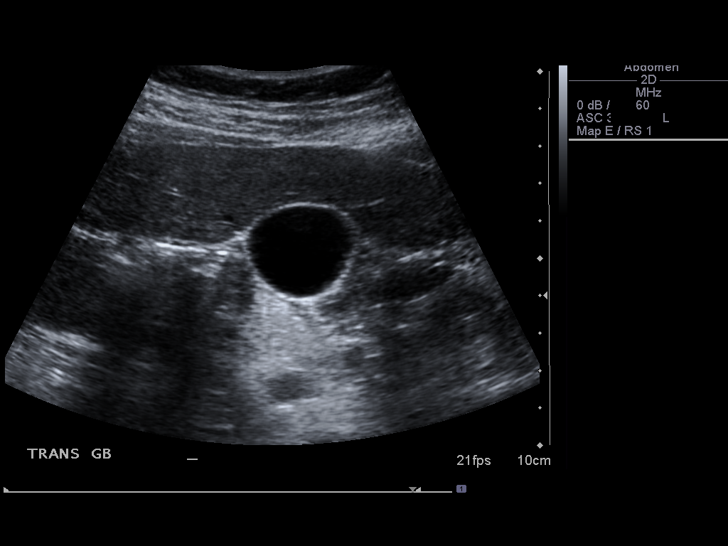
[im 17/17]
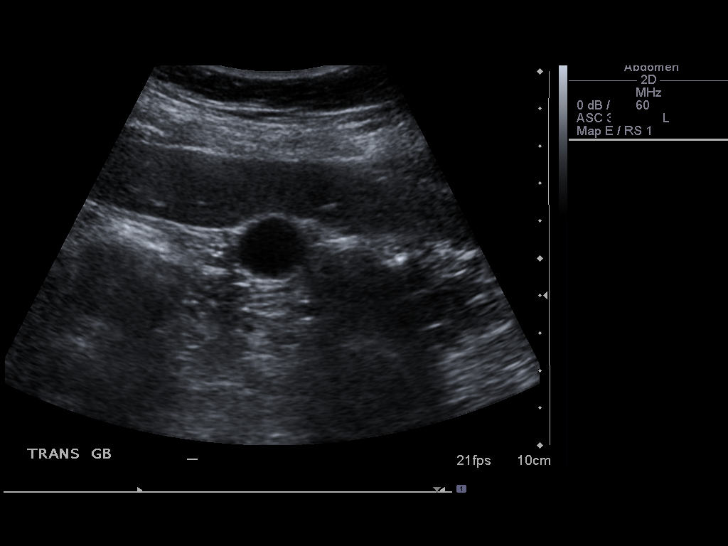

[14 of 17 positions shown; findings below may reference images not displayed]

FINDINGS: With fasting, normal distention of gallbladder appears
normal without sludge, gallstones with normal 2 mm wall thickness,
no sonographic Murphy's sign.  No dilated intrahepatic or
extrahepatic bile ducts are seen with common bile duct measuring
normally at 4 mm.
IMPRESSION: Normal.

## 2012-01-14 ENCOUNTER — Emergency Department (HOSPITAL_COMMUNITY)
Admission: EM | Admit: 2012-01-14 | Discharge: 2012-01-14 | Disposition: A | Discharge status: Home / Self Care | Payer: Medicaid Other | Attending: Emergency Medicine | Admitting: Emergency Medicine

## 2012-01-14 ENCOUNTER — Encounter (HOSPITAL_COMMUNITY): Payer: Self-pay | Admitting: Emergency Medicine

## 2012-01-14 ENCOUNTER — Emergency Department (INDEPENDENT_AMBULATORY_CARE_PROVIDER_SITE_OTHER)
Admission: EM | Admit: 2012-01-14 | Discharge: 2012-01-14 | Disposition: A | Discharge status: Home / Self Care | Payer: Medicaid Other | Source: Home / Self Care | Attending: Family Medicine | Admitting: Family Medicine

## 2012-01-14 DIAGNOSIS — R3989 Other symptoms and signs involving the genitourinary system: Secondary | ICD-10-CM | POA: Insufficient documentation

## 2012-01-14 DIAGNOSIS — Z87442 Personal history of urinary calculi: Secondary | ICD-10-CM | POA: Insufficient documentation

## 2012-01-14 DIAGNOSIS — R109 Unspecified abdominal pain: Secondary | ICD-10-CM | POA: Insufficient documentation

## 2012-01-14 HISTORY — DX: Urinary tract infection, site not specified: N39.0

## 2012-01-14 HISTORY — DX: Calculus of kidney: N20.0

## 2012-01-14 LAB — CBC WITH DIFFERENTIAL/PLATELET
Eosinophils Absolute: 0.2 10*3/uL (ref 0.0–0.7)
Eosinophils Relative: 4 % (ref 0–5)
HCT: 44.9 % (ref 36.0–46.0)
Hemoglobin: 15.3 g/dL — ABNORMAL HIGH (ref 12.0–15.0)
Lymphs Abs: 1.8 10*3/uL (ref 0.7–4.0)
MCH: 32.1 pg (ref 26.0–34.0)
MCV: 94.1 fL (ref 78.0–100.0)
Monocytes Absolute: 0.6 10*3/uL (ref 0.1–1.0)
Monocytes Relative: 10 % (ref 3–12)
Platelets: 284 10*3/uL (ref 150–400)
RBC: 4.77 MIL/uL (ref 3.87–5.11)

## 2012-01-14 LAB — POCT URINALYSIS DIP (DEVICE)
Glucose, UA: NEGATIVE mg/dL
Hgb urine dipstick: NEGATIVE
Specific Gravity, Urine: <=1.005 (ref 1.005–1.030)
Urobilinogen, UA: 0.2 mg/dL (ref 0.0–1.0)

## 2012-01-14 LAB — BASIC METABOLIC PANEL
BUN: 15 mg/dL (ref 6–23)
Calcium: 9.9 mg/dL (ref 8.4–10.5)
GFR calc non Af Amer: >90 mL/min (ref >90)
Glucose, Bld: 94 mg/dL (ref 70–99)

## 2012-01-14 LAB — URINALYSIS, ROUTINE W REFLEX MICROSCOPIC
Bilirubin Urine: NEGATIVE
Hgb urine dipstick: NEGATIVE
Protein, ur: NEGATIVE mg/dL
Urobilinogen, UA: 1 mg/dL (ref 0.0–1.0)

## 2012-01-14 MED ORDER — ONDANSETRON 4 MG PO TBDP
ORAL_TABLET | ORAL | Status: AC
  Filled 2012-01-14: qty 2

## 2012-01-14 MED ORDER — ONDANSETRON 4 MG PO TBDP
8.0000 mg | ORAL_TABLET | Freq: Once | ORAL | Status: AC
  Administered 2012-01-14: 8 mg via ORAL

## 2012-01-14 MED ORDER — OXYCODONE-ACETAMINOPHEN 5-325 MG PO TABS: 1.0000 | ORAL_TABLET | Freq: Four times a day (QID) | ORAL | Status: DC | PRN

## 2012-01-14 MED ORDER — KETOROLAC TROMETHAMINE 60 MG/2ML IM SOLN
60.0000 mg | Freq: Once | INTRAMUSCULAR | Status: AC
  Administered 2012-01-14: 60 mg via INTRAMUSCULAR

## 2012-01-14 MED ORDER — KETOROLAC TROMETHAMINE 60 MG/2ML IM SOLN
INTRAMUSCULAR | Status: AC
  Filled 2012-01-14: qty 2

## 2012-01-14 MED ORDER — SODIUM CHLORIDE 0.9 % IV BOLUS (SEPSIS)
1000.0000 mL | Freq: Once | INTRAVENOUS | Status: AC
  Administered 2012-01-14: 1000 mL via INTRAVENOUS

## 2012-01-14 MED ORDER — ONDANSETRON HCL 4 MG/2ML IJ SOLN
4.0000 mg | Freq: Once | INTRAMUSCULAR | Status: AC
  Administered 2012-01-14: 4 mg via INTRAVENOUS
  Filled 2012-01-14: qty 2

## 2012-01-14 MED ORDER — PROMETHAZINE HCL 25 MG PO TABS: 25.0000 mg | ORAL_TABLET | Freq: Four times a day (QID) | ORAL | Status: DC | PRN

## 2012-01-14 NOTE — ED Notes (Signed)
C/o dark urine, right flank pain raDIATING INTO RLQ worsening over past 3 weeks. +nausea no emesis, hot & cold chills.  Denies diarrhea, urnary frequency, dysuria. C/o poor appetite, decreased oral intake, feeling weak all over

## 2012-01-14 NOTE — ED Notes (Signed)
Pt c/o poss UTI x3 weeks.... Pt has not sought medical attention due to lack of ins... Sx include: lower back pain on right side that radiates to the pelvic, fevers, very nauseas, brown/dark urine... Denies: vomiting, diarrhea, discharge, spotting

## 2012-01-14 NOTE — ED Notes (Signed)
Pt here from [facility] with right sided flank pain x 3 weeks with dark urine and nausea; pt sent here for further eval

## 2012-01-14 NOTE — ED Provider Notes (Signed)
History     CSN: [id number]  Arrival date & time 01/14/12  1156   First MD Initiated Contact with Patient 01/14/12 1350      Chief Complaint  Patient presents with  . Urinary Tract Infection    (Consider location/radiation/quality/duration/timing/severity/associated sxs/prior treatment) Patient is a 36 y.o. female presenting with urinary tract infection. The history is provided by the patient.  Urinary Tract Infection Associated symptoms include headaches.  Patient reports three weeks of urinary retention, right flank pain and "brown urine."  States pain is intermittent in nature radiating into right groin associated with no known fevers but admits to chills and decreased appetite.  Reports nausea that began over the past 2 days.   Reports history of kidney infections and kidney stones in past.    Past Medical History  Diagnosis Date  . Asthma   . Emphysema   . COPD (chronic obstructive pulmonary disease)   . Fibromyalgia, primary   . Anxiety   . Depressed   . Nephrolithiasis   . UTI (lower urinary tract infection)     Past Surgical History  Procedure Date  . Abdominal hysterectomy     No family history on file.  History  Substance Use Topics  . Smoking status: Current Every Day Smoker -- 0.5 packs/day    Types: Cigarettes  . Smokeless tobacco: Never Used  . Alcohol Use: Yes     occasionally    OB History    Grav Para Term Preterm Abortions TAB SAB Ect Mult Living                  Review of Systems  Constitutional: Positive for chills, appetite change and fatigue.  Gastrointestinal: Positive for nausea.  Genitourinary: Positive for flank pain, decreased urine volume and difficulty urinating.  Neurological: Positive for headaches.  All other systems reviewed and are negative.    Allergies  Eggs or egg-derived products; Milk-related compounds; and Sulfa antibiotics  Home Medications   Current Outpatient Rx  Name Route Sig Dispense Refill  .  IBUPROFEN 200 MG PO TABS Oral Take 400 mg by mouth every 6 (six) hours as needed. pain    . OXYCODONE-ACETAMINOPHEN 5-325 MG PO TABS Oral Take 1-2 tablets by mouth every 6 (six) hours as needed for pain. 20 tablet 0  . PROMETHAZINE HCL 25 MG PO TABS Oral Take 1 tablet (25 mg total) by mouth every 6 (six) hours as needed for nausea. 20 tablet 0    There were no vitals taken for this visit.  Physical Exam  Nursing note and vitals reviewed. Constitutional: She is oriented to person, place, and time. Vital signs are normal. She appears well-developed and well-nourished. She is active and cooperative.  HENT:  Head: Normocephalic.  Mouth/Throat: Oropharynx is clear and moist. No oropharyngeal exudate.  Eyes: Conjunctivae normal are normal. Pupils are equal, round, and reactive to light. No scleral icterus.  Neck: Trachea normal. Neck supple.  Cardiovascular: Normal rate, regular rhythm and normal heart sounds.   Pulmonary/Chest: Effort normal and breath sounds normal.  Abdominal: Soft. Bowel sounds are normal. There is no tenderness. There is CVA tenderness.  Lymphadenopathy:    She has no cervical adenopathy.  Neurological: She is alert and oriented to person, place, and time. She has normal strength and normal reflexes. No cranial nerve deficit or sensory deficit. Gait normal. GCS eye subscore is 4. GCS verbal subscore is 5. GCS motor subscore is 6.  Skin: Skin is warm and dry.  Psychiatric:  She has a normal mood and affect. Her speech is normal and behavior is normal. Judgment and thought content normal. Cognition and memory are normal.    ED Course  Procedures (including critical care time)   Labs Reviewed  POCT URINALYSIS DIP (DEVICE)  LAB REPORT - SCANNED   No results found.   1. Right flank pain       MDM  Toradol 60mg  Im and Zofran 8mg  ODT administered in office.  Transfer to [facility] for further evaluation and management.          Johnsie Kindred, NP 01/17/12 1721

## 2012-01-14 NOTE — ED Provider Notes (Signed)
History     CSN: [id number]  Arrival date & time 01/14/12  1438   First MD Initiated Contact with Patient 01/14/12 1626      Chief Complaint  Patient presents with  . Flank Pain  . Nausea    (Consider location/radiation/quality/duration/timing/severity/associated sxs/prior treatment) HPI Pt reports three weeks of dark urine and mild nausea, denies any pain with urination but reports she is not urinating very frequently. She has had R lower back pain for the last few days, radiating to R lower abdomen. She has had kidney stones and UTI before and feels similar to both. Denies any fever, vomiting or diarrhea. No vaginal bleeding or discharge. She has had hysterectomy.   Past Medical History  Diagnosis Date  . Asthma   . Emphysema   . COPD (chronic obstructive pulmonary disease)   . Fibromyalgia, primary   . Anxiety   . Depressed   . Nephrolithiasis   . UTI (lower urinary tract infection)     Past Surgical History  Procedure Date  . Abdominal hysterectomy     History reviewed. No pertinent family history.  History  Substance Use Topics  . Smoking status: Current Every Day Smoker -- 0.5 packs/day    Types: Cigarettes  . Smokeless tobacco: Never Used  . Alcohol Use: Yes     occasionally    OB History    Grav Para Term Preterm Abortions TAB SAB Ect Mult Living                  Review of Systems All other systems reviewed and are negative except as noted in HPI.   Allergies  Eggs or egg-derived products; Milk-related compounds; and Sulfa antibiotics  Home Medications   Current Outpatient Rx  Name Route Sig Dispense Refill  . IBUPROFEN 200 MG PO TABS Oral Take 400 mg by mouth every 6 (six) hours as needed. pain      BP 143/110  Pulse 83  Temp 98.4 F (36.9 C) (Oral)  Resp 18  SpO2 98%  Physical Exam  Nursing note and vitals reviewed. Constitutional: She is oriented to person, place, and time. She appears well-developed and well-nourished.  HENT:    Head: Normocephalic and atraumatic.  Eyes: EOM are normal. Pupils are equal, round, and reactive to light.  Neck: Normal range of motion. Neck supple.  Cardiovascular: Normal rate, normal heart sounds and intact distal pulses.   Pulmonary/Chest: Effort normal and breath sounds normal.  Abdominal: Bowel sounds are normal. She exhibits no distension. There is no tenderness.  Musculoskeletal: Normal range of motion. She exhibits no edema and no tenderness.  Neurological: She is alert and oriented to person, place, and time. She has normal strength. No cranial nerve deficit or sensory deficit.  Skin: Skin is warm and dry. No rash noted.  Psychiatric: She has a normal mood and affect.    ED Course  Procedures (including critical care time)  Labs Reviewed  CBC WITH DIFFERENTIAL - Abnormal; Notable for the following:    Hemoglobin 15.3 (*)     All other components within normal limits  BASIC METABOLIC PANEL - Abnormal; Notable for the following:    Sodium 134 (*)     All other components within normal limits  URINALYSIS, ROUTINE W REFLEX MICROSCOPIC - Abnormal; Notable for the following:    APPearance CLOUDY (*)     All other components within normal limits   No results found.   No diagnosis found.    MDM  Dip UA at [facility] neg. Sent to the ED for further eval. Labs ordered in triage are neg. Will check formal UA here. Give IVF and reassess.   UA here normal. Pt states pain has returned. Will d/c with pain and nausea meds. Advised PCP followup.       Tanajah Boulter B. Bernette Mayers, MD 01/14/12 1610

## 2012-01-18 NOTE — ED Provider Notes (Signed)
Medical screening examination/treatment/procedure(s) were performed by resident physician or non-physician practitioner and as supervising physician I was immediately available for consultation/collaboration.   Chauncy Mangiaracina DOUGLAS MD.    Jaysion Ramseyer D Shammond Arave, MD 01/18/12 1826 

## 2012-01-31 ENCOUNTER — Emergency Department (HOSPITAL_COMMUNITY): Payer: Medicaid Other

## 2012-01-31 ENCOUNTER — Encounter (HOSPITAL_COMMUNITY): Payer: Self-pay | Admitting: *Deleted

## 2012-01-31 ENCOUNTER — Emergency Department (HOSPITAL_COMMUNITY)
Admission: EM | Admit: 2012-01-31 | Discharge: 2012-01-31 | Disposition: A | Discharge status: Home / Self Care | Payer: Medicaid Other | Attending: Emergency Medicine | Admitting: Emergency Medicine

## 2012-01-31 DIAGNOSIS — J449 Chronic obstructive pulmonary disease, unspecified: Secondary | ICD-10-CM | POA: Insufficient documentation

## 2012-01-31 DIAGNOSIS — W172XXA Fall into hole, initial encounter: Secondary | ICD-10-CM | POA: Insufficient documentation

## 2012-01-31 DIAGNOSIS — Z8739 Personal history of other diseases of the musculoskeletal system and connective tissue: Secondary | ICD-10-CM | POA: Insufficient documentation

## 2012-01-31 DIAGNOSIS — Y939 Activity, unspecified: Secondary | ICD-10-CM | POA: Insufficient documentation

## 2012-01-31 DIAGNOSIS — Z87440 Personal history of urinary (tract) infections: Secondary | ICD-10-CM | POA: Insufficient documentation

## 2012-01-31 DIAGNOSIS — J45909 Unspecified asthma, uncomplicated: Secondary | ICD-10-CM | POA: Insufficient documentation

## 2012-01-31 DIAGNOSIS — F3289 Other specified depressive episodes: Secondary | ICD-10-CM | POA: Insufficient documentation

## 2012-01-31 DIAGNOSIS — F329 Major depressive disorder, single episode, unspecified: Secondary | ICD-10-CM | POA: Insufficient documentation

## 2012-01-31 DIAGNOSIS — S82831A Other fracture of upper and lower end of right fibula, initial encounter for closed fracture: Secondary | ICD-10-CM

## 2012-01-31 DIAGNOSIS — J4489 Other specified chronic obstructive pulmonary disease: Secondary | ICD-10-CM | POA: Insufficient documentation

## 2012-01-31 DIAGNOSIS — Y9269 Other specified industrial and construction area as the place of occurrence of the external cause: Secondary | ICD-10-CM | POA: Insufficient documentation

## 2012-01-31 DIAGNOSIS — S82899A Other fracture of unspecified lower leg, initial encounter for closed fracture: Secondary | ICD-10-CM | POA: Insufficient documentation

## 2012-01-31 DIAGNOSIS — F411 Generalized anxiety disorder: Secondary | ICD-10-CM | POA: Insufficient documentation

## 2012-01-31 DIAGNOSIS — F17200 Nicotine dependence, unspecified, uncomplicated: Secondary | ICD-10-CM | POA: Insufficient documentation

## 2012-01-31 DIAGNOSIS — Z87442 Personal history of urinary calculi: Secondary | ICD-10-CM | POA: Insufficient documentation

## 2012-01-31 HISTORY — DX: Calculus of kidney: N20.0

## 2012-01-31 MED ORDER — MORPHINE SULFATE 4 MG/ML IJ SOLN
2.0000 mg | Freq: Once | INTRAMUSCULAR | Status: AC
  Administered 2012-01-31: 2 mg via INTRAVENOUS
  Filled 2012-01-31: qty 1

## 2012-01-31 MED ORDER — PROMETHAZINE HCL 25 MG PO TABS: 25.0000 mg | ORAL_TABLET | Freq: Four times a day (QID) | ORAL | Status: DC | PRN

## 2012-01-31 MED ORDER — OXYCODONE-ACETAMINOPHEN 5-325 MG PO TABS
2.0000 | ORAL_TABLET | Freq: Once | ORAL | Status: AC
  Administered 2012-01-31: 2 via ORAL
  Filled 2012-01-31 (×2): qty 1

## 2012-01-31 MED ORDER — MORPHINE SULFATE 4 MG/ML IJ SOLN
4.0000 mg | Freq: Once | INTRAMUSCULAR | Status: AC
  Administered 2012-01-31: 4 mg via INTRAVENOUS
  Filled 2012-01-31: qty 1

## 2012-01-31 MED ORDER — OXYCODONE-ACETAMINOPHEN 5-325 MG PO TABS: 2.0000 | ORAL_TABLET | ORAL | Status: DC | PRN

## 2012-01-31 MED ORDER — ONDANSETRON HCL 4 MG/2ML IJ SOLN
4.0000 mg | Freq: Once | INTRAMUSCULAR | Status: AC
  Administered 2012-01-31: 4 mg via INTRAVENOUS
  Filled 2012-01-31: qty 2

## 2012-01-31 NOTE — ED Notes (Signed)
PT is here after stepping of porch with dog and rolled ankle area.  Pt has inward rotation of right foot with bruising and swelling.  Pt has hard time wiggling toes

## 2012-01-31 NOTE — ED Provider Notes (Signed)
History     CSN: [id number]  Arrival date & time 01/31/12  4098   First MD Initiated Contact with Patient 01/31/12 1003      No chief complaint on file.   (Consider location/radiation/quality/duration/timing/severity/associated sxs/prior treatment) HPI Comments: Patient is a 36 year old female who presents with right ankle pain that started yesterday after "rolling" her ankle when she stepped in a hole in the yard. The mechanism of injury was sudden ankle inversion. Patient reports hearing a "pop" sudden onset of throbbing, severe pain that is localized to right ankle. Patient reports progressive worsening of pain. Ankle movement and weight bearing activity make the pain worse. Nothing makes the pain better. Patient reports associated swelling. Patient has tried ibuprofen for pain relief. Patient denies obvious deformity, numbness/tingling, coolness/weakness of extremity, bruising, and any other injury.      Past Medical History  Diagnosis Date  . Asthma   . Emphysema   . COPD (chronic obstructive pulmonary disease)   . Fibromyalgia, primary   . Anxiety   . Depressed   . Nephrolithiasis   . UTI (lower urinary tract infection)     Past Surgical History  Procedure Date  . Abdominal hysterectomy     No family history on file.  History  Substance Use Topics  . Smoking status: Current Every Day Smoker -- 0.5 packs/day    Types: Cigarettes  . Smokeless tobacco: Never Used  . Alcohol Use: Yes     Comment: occasionally    OB History    Grav Para Term Preterm Abortions TAB SAB Ect Mult Living                  Review of Systems  Musculoskeletal: Positive for joint swelling and arthralgias.  All other systems reviewed and are negative.    Allergies  Eggs or egg-derived products; Milk-related compounds; and Sulfa antibiotics  Home Medications   Current Outpatient Rx  Name  Route  Sig  Dispense  Refill  . IBUPROFEN 200 MG PO TABS   Oral   Take 400 mg by mouth  every 6 (six) hours as needed. pain         . OXYCODONE-ACETAMINOPHEN 5-325 MG PO TABS   Oral   Take 1-2 tablets by mouth every 6 (six) hours as needed for pain.   20 tablet   0   . PROMETHAZINE HCL 25 MG PO TABS   Oral   Take 1 tablet (25 mg total) by mouth every 6 (six) hours as needed for nausea.   20 tablet   0     BP 143/99  Pulse 101  Temp 98.9 F (37.2 C) (Oral)  Resp 22  SpO2 97%  Physical Exam  Nursing note and vitals reviewed. Constitutional: She is oriented to person, place, and time. She appears well-developed and well-nourished. No distress.  HENT:  Head: Normocephalic and atraumatic.  Mouth/Throat: No oropharyngeal exudate.  Eyes: Conjunctivae normal and EOM are normal. Pupils are equal, round, and reactive to light.  Neck: Normal range of motion. Neck supple.  Cardiovascular: Normal rate, regular rhythm and intact distal pulses.  Exam reveals no gallop and no friction rub.   No murmur heard. Pulmonary/Chest: Effort normal and breath sounds normal. She has no wheezes. She has no rales. She exhibits no tenderness.  Abdominal: Soft. She exhibits no distension.  Musculoskeletal: Normal range of motion.       Patient has significant edema at the lateral malleolus of the right ankle. Edema  localized to the right ankle and does not extend into the foot or up the leg. Affected area tender to palpation and ROM limited due to pain and swelling. No other joint swelling, tenderness or injury noted.    Neurological: She is alert and oriented to person, place, and time. Coordination normal.       Speech is goal-oriented. Moves limbs without ataxia.   Skin: Skin is warm and dry. She is not diaphoretic.  Psychiatric: She has a normal mood and affect. Her behavior is normal.    ED Course  Procedures (including critical care time)  Labs Reviewed - No data to display Dg Ankle Complete Right  01/31/2012  *RADIOLOGY REPORT*  Clinical Data: Stepped in a hole, pain, swelling  and deformity lateral malleolus with bruising  RIGHT ANKLE - COMPLETE 3+ VIEW  Comparison: 09/18/2009  Findings: Osseous mineralization normal. Ankle mortise intact. Diffuse soft tissue swelling the lateral aspect of the right ankle and distal lower leg. Minimally displaced distal right fibular metadiaphyseal fracture. No additional fracture or dislocation identified.  IMPRESSION: Oblique distal right fibular fracture.   Original Report Authenticated By: Ulyses Southward, M.D.    Ct Ankle Right Wo Contrast  01/31/2012  *RADIOLOGY REPORT*  Clinical Data: Evaluate ankle fractures.  CT OF THE RIGHT ANKLE WITH CONTRAST  Technique:  Multidetector CT imaging was performed following the standard protocol during bolus administration of intravenous contrast.  Comparison: Radiographs 01/31/2012.  Findings: There is a comminuted oblique fracture involving the distal fibula at and above the level of the ankle mortise.  Mild displacement.  Maximal displacement is 4.8 mm.  No fracture of the tibia or talus.  Os trigonum is noted.  The subtalar joints are maintained.  IMPRESSION: Isolated comminuted distal fibular fracture.   Original Report Authenticated By: Rudie Meyer, M.D.      1. Closed fracture of right distal fibula       MDM  10:16 AM Patient will have xray of right ankle and pain medication.   11:21 AM Patient feels mild relief of pain with percocet. I will give her an additional 2mg  morphine IV for pain control. Xray shows mildly displaced oblique fibular fracture. Will order ankle CT for further evaluation.   1:59 PM Ankle CT shows comminuted and displaced distal fibular fracture. Patient will have a posterior and stirrup ankle splint and crutches to go home with. She will be instructed not to bear weight on the affected foot. Patient will be discharged with pain medication and recommended follow up with Orthopedics for further evaluation and management. Patient is neurovascularly intact distal to the  injury. No further evaluation needed at this time.    [name], [place] 02/01/12 [phone]

## 2012-02-01 ENCOUNTER — Other Ambulatory Visit (HOSPITAL_COMMUNITY): Payer: Self-pay | Admitting: Orthopedic Surgery

## 2012-02-02 MED ORDER — CEFAZOLIN SODIUM-DEXTROSE 2-3 GM-% IV SOLR: 2.0000 g | INTRAVENOUS | Status: DC

## 2012-02-02 MED ORDER — DEXTROSE 5 % IV SOLN: 2.0000 g | INTRAVENOUS | Status: DC

## 2012-02-02 NOTE — Progress Notes (Signed)
THURSDAY....WAS HERE 3 WEEKS AGO FOR KIDNEY STONES.......DA

## 2012-02-03 ENCOUNTER — Encounter (HOSPITAL_COMMUNITY): Payer: Self-pay | Admitting: Anesthesiology

## 2012-02-03 ENCOUNTER — Encounter (HOSPITAL_COMMUNITY)
Admission: RE | Disposition: A | Discharge status: Home / Self Care | Payer: Self-pay | Source: Ambulatory Visit | Attending: Orthopedic Surgery

## 2012-02-03 ENCOUNTER — Ambulatory Visit (HOSPITAL_COMMUNITY)
Admission: RE | Admit: 2012-02-03 | Discharge: 2012-02-03 | Disposition: A | Discharge status: Home / Self Care | Payer: Medicaid Other | Source: Ambulatory Visit | Attending: Orthopedic Surgery | Admitting: Orthopedic Surgery

## 2012-02-03 ENCOUNTER — Encounter (HOSPITAL_COMMUNITY): Payer: Self-pay | Admitting: *Deleted

## 2012-02-03 ENCOUNTER — Ambulatory Visit (HOSPITAL_COMMUNITY): Payer: Medicaid Other | Admitting: Anesthesiology

## 2012-02-03 DIAGNOSIS — F17200 Nicotine dependence, unspecified, uncomplicated: Secondary | ICD-10-CM | POA: Insufficient documentation

## 2012-02-03 DIAGNOSIS — J4489 Other specified chronic obstructive pulmonary disease: Secondary | ICD-10-CM | POA: Insufficient documentation

## 2012-02-03 DIAGNOSIS — X500XXA Overexertion from strenuous movement or load, initial encounter: Secondary | ICD-10-CM | POA: Insufficient documentation

## 2012-02-03 DIAGNOSIS — S82899A Other fracture of unspecified lower leg, initial encounter for closed fracture: Secondary | ICD-10-CM | POA: Insufficient documentation

## 2012-02-03 DIAGNOSIS — IMO0001 Reserved for inherently not codable concepts without codable children: Secondary | ICD-10-CM | POA: Insufficient documentation

## 2012-02-03 DIAGNOSIS — J449 Chronic obstructive pulmonary disease, unspecified: Secondary | ICD-10-CM | POA: Insufficient documentation

## 2012-02-03 DIAGNOSIS — S82891A Other fracture of right lower leg, initial encounter for closed fracture: Secondary | ICD-10-CM

## 2012-02-03 HISTORY — PX: ORIF ANKLE FRACTURE: SHX5408

## 2012-02-03 LAB — CBC
MCH: 31.5 pg (ref 26.0–34.0)
MCV: 93.9 fL (ref 78.0–100.0)
Platelets: 205 10*3/uL (ref 150–400)
RDW: 13.5 % (ref 11.5–15.5)

## 2012-02-03 LAB — COMPREHENSIVE METABOLIC PANEL
AST: 394 U/L — ABNORMAL HIGH (ref 0–37)
CO2: 25 mEq/L (ref 19–32)
Calcium: 9.6 mg/dL (ref 8.4–10.5)
Creatinine, Ser: 0.68 mg/dL (ref 0.50–1.10)
GFR calc non Af Amer: >90 mL/min (ref >90)

## 2012-02-03 LAB — PROTIME-INR
INR: 0.88 (ref 0.00–1.49)
Prothrombin Time: 11.9 seconds (ref 11.6–15.2)

## 2012-02-03 LAB — SURGICAL PCR SCREEN: MRSA, PCR: NEGATIVE

## 2012-02-03 SURGERY — OPEN REDUCTION INTERNAL FIXATION (ORIF) ANKLE FRACTURE
Anesthesia: General | Site: Ankle | Laterality: Right | Wound class: Clean

## 2012-02-03 MED ORDER — METHOCARBAMOL 500 MG PO TABS
500.0000 mg | ORAL_TABLET | Freq: Once | ORAL | Status: AC
  Administered 2012-02-03: 500 mg via ORAL

## 2012-02-03 MED ORDER — BUPIVACAINE HCL (PF) 0.5 % IJ SOLN
INTRAMUSCULAR | Status: AC
  Filled 2012-02-03: qty 30

## 2012-02-03 MED ORDER — NEOSTIGMINE METHYLSULFATE 1 MG/ML IJ SOLN
INTRAMUSCULAR | Status: DC | PRN
  Administered 2012-02-03: 4 mg via INTRAVENOUS
  Administered 2012-02-03: 1 mg via INTRAVENOUS

## 2012-02-03 MED ORDER — HYDROMORPHONE HCL PF 1 MG/ML IJ SOLN
INTRAMUSCULAR | Status: AC
  Filled 2012-02-03: qty 2

## 2012-02-03 MED ORDER — 0.9 % SODIUM CHLORIDE (POUR BTL) OPTIME
TOPICAL | Status: DC | PRN
  Administered 2012-02-03: 1000 mL

## 2012-02-03 MED ORDER — LIDOCAINE HCL (CARDIAC) 20 MG/ML IV SOLN
INTRAVENOUS | Status: DC | PRN
  Administered 2012-02-03: 50 mg via INTRAVENOUS

## 2012-02-03 MED ORDER — FENTANYL CITRATE 0.05 MG/ML IJ SOLN
INTRAMUSCULAR | Status: DC | PRN
  Administered 2012-02-03: 150 ug via INTRAVENOUS
  Administered 2012-02-03: 100 ug via INTRAVENOUS
  Administered 2012-02-03: 150 ug via INTRAVENOUS
  Administered 2012-02-03: 50 ug via INTRAVENOUS

## 2012-02-03 MED ORDER — HYDROMORPHONE HCL PF 1 MG/ML IJ SOLN: 0.2500 mg | INTRAMUSCULAR | Status: DC | PRN

## 2012-02-03 MED ORDER — PROPOFOL 10 MG/ML IV BOLUS
INTRAVENOUS | Status: DC | PRN
  Administered 2012-02-03: 60 mg via INTRAVENOUS
  Administered 2012-02-03: 200 mg via INTRAVENOUS

## 2012-02-03 MED ORDER — BUPIVACAINE HCL 0.5 % IJ SOLN
INTRAMUSCULAR | Status: DC | PRN
  Administered 2012-02-03: 10 mL

## 2012-02-03 MED ORDER — OXYCODONE-ACETAMINOPHEN 5-325 MG PO TABS: 1.0000 | ORAL_TABLET | ORAL | Status: DC | PRN

## 2012-02-03 MED ORDER — LACTATED RINGERS IV SOLN
INTRAVENOUS | Status: DC
  Administered 2012-02-03: 12:00:00 via INTRAVENOUS

## 2012-02-03 MED ORDER — SUCCINYLCHOLINE CHLORIDE 20 MG/ML IJ SOLN
INTRAMUSCULAR | Status: DC | PRN
  Administered 2012-02-03: 120 mg via INTRAVENOUS

## 2012-02-03 MED ORDER — MUPIROCIN 2 % EX OINT
TOPICAL_OINTMENT | Freq: Two times a day (BID) | CUTANEOUS | Status: DC
  Administered 2012-02-03: 1 via NASAL

## 2012-02-03 MED ORDER — HYDROMORPHONE HCL PF 1 MG/ML IJ SOLN
INTRAMUSCULAR | Status: AC
  Filled 2012-02-03: qty 1

## 2012-02-03 MED ORDER — HYDROMORPHONE HCL PF 1 MG/ML IJ SOLN
0.5000 mg | INTRAMUSCULAR | Status: DC | PRN
  Administered 2012-02-03 (×6): 0.5 mg via INTRAVENOUS

## 2012-02-03 MED ORDER — ONDANSETRON HCL 4 MG/2ML IJ SOLN
INTRAMUSCULAR | Status: DC | PRN
  Administered 2012-02-03: 4 mg via INTRAVENOUS

## 2012-02-03 MED ORDER — MIDAZOLAM HCL 5 MG/5ML IJ SOLN
INTRAMUSCULAR | Status: DC | PRN
  Administered 2012-02-03: 2 mg via INTRAVENOUS

## 2012-02-03 MED ORDER — LACTATED RINGERS IV SOLN
INTRAVENOUS | Status: DC | PRN
  Administered 2012-02-03: 12:00:00 via INTRAVENOUS

## 2012-02-03 MED ORDER — CEFAZOLIN SODIUM-DEXTROSE 2-3 GM-% IV SOLR
INTRAVENOUS | Status: AC
  Administered 2012-02-03: 2 g via INTRAVENOUS
  Filled 2012-02-03: qty 50

## 2012-02-03 MED ORDER — ROCURONIUM BROMIDE 100 MG/10ML IV SOLN
INTRAVENOUS | Status: DC | PRN
  Administered 2012-02-03: 40 mg via INTRAVENOUS

## 2012-02-03 MED ORDER — METHOCARBAMOL 500 MG PO TABS
ORAL_TABLET | ORAL | Status: AC
  Filled 2012-02-03: qty 1

## 2012-02-03 MED ORDER — GLYCOPYRROLATE 0.2 MG/ML IJ SOLN
INTRAMUSCULAR | Status: DC | PRN
  Administered 2012-02-03: 0.6 mg via INTRAVENOUS
  Administered 2012-02-03: 0.2 mg via INTRAVENOUS

## 2012-02-03 MED ORDER — MUPIROCIN 2 % EX OINT
TOPICAL_OINTMENT | CUTANEOUS | Status: AC
  Administered 2012-02-03: 1 via NASAL
  Filled 2012-02-03: qty 22

## 2012-02-03 SURGICAL SUPPLY — 53 items
BANDAGE ESMARK 6X9 LF (GAUZE/BANDAGES/DRESSINGS) IMPLANT
BANDAGE GAUZE ELAST BULKY 4 IN (GAUZE/BANDAGES/DRESSINGS) ×1 IMPLANT
BIT DRILL 2.5X2.75 QC CALB (BIT) ×1 IMPLANT
BNDG CMPR 9X6 STRL LF SNTH (GAUZE/BANDAGES/DRESSINGS) ×1
BNDG COHESIVE 4X5 TAN STRL (GAUZE/BANDAGES/DRESSINGS) ×2 IMPLANT
BNDG ESMARK 6X9 LF (GAUZE/BANDAGES/DRESSINGS) ×2
BNDG GAUZE STRTCH 6 (GAUZE/BANDAGES/DRESSINGS) ×2 IMPLANT
CLOTH BEACON ORANGE TIMEOUT ST (SAFETY) ×2 IMPLANT
COVER SURGICAL LIGHT HANDLE (MISCELLANEOUS) ×2 IMPLANT
CUFF TOURNIQUET SINGLE 34IN LL (TOURNIQUET CUFF) IMPLANT
CUFF TOURNIQUET SINGLE 44IN (TOURNIQUET CUFF) IMPLANT
DRAPE C-ARM MINI 42X72 WSTRAPS (DRAPES) IMPLANT
DRAPE INCISE IOBAN 66X45 STRL (DRAPES) ×1 IMPLANT
DRAPE OEC MINIVIEW 54X84 (DRAPES) ×1 IMPLANT
DRAPE PROXIMA HALF (DRAPES) ×2 IMPLANT
DRAPE U-SHAPE 47X51 STRL (DRAPES) ×2 IMPLANT
DRSG ADAPTIC 3X8 NADH LF (GAUZE/BANDAGES/DRESSINGS) ×2 IMPLANT
DRSG PAD ABDOMINAL 8X10 ST (GAUZE/BANDAGES/DRESSINGS) ×1 IMPLANT
DURAPREP 26ML APPLICATOR (WOUND CARE) ×2 IMPLANT
ELECT REM PT RETURN 9FT ADLT (ELECTROSURGICAL) ×2
ELECTRODE REM PT RTRN 9FT ADLT (ELECTROSURGICAL) ×1 IMPLANT
GLOVE BIOGEL PI IND STRL 9 (GLOVE) ×1 IMPLANT
GLOVE BIOGEL PI INDICATOR 9 (GLOVE) ×1
GLOVE SURG ORTHO 9.0 STRL STRW (GLOVE) ×2 IMPLANT
GLOVE SURG SS PI 7.5 STRL IVOR (GLOVE) ×1 IMPLANT
GOWN PREVENTION PLUS XLARGE (GOWN DISPOSABLE) ×2 IMPLANT
GOWN SRG XL XLNG 56XLVL 4 (GOWN DISPOSABLE) ×2 IMPLANT
GOWN STRL NON-REIN XL XLG LVL4 (GOWN DISPOSABLE) ×4
KIT BASIN OR (CUSTOM PROCEDURE TRAY) ×2 IMPLANT
KIT ROOM TURNOVER OR (KITS) ×2 IMPLANT
MANIFOLD NEPTUNE II (INSTRUMENTS) ×2 IMPLANT
NDL HYPO 25GX1X1/2 BEV (NEEDLE) IMPLANT
NEEDLE HYPO 25GX1X1/2 BEV (NEEDLE) ×2 IMPLANT
NS IRRIG 1000ML POUR BTL (IV SOLUTION) ×2 IMPLANT
PACK ORTHO EXTREMITY (CUSTOM PROCEDURE TRAY) ×2 IMPLANT
PAD ARMBOARD 7.5X6 YLW CONV (MISCELLANEOUS) ×4 IMPLANT
PADDING CAST COTTON 6X4 STRL (CAST SUPPLIES) ×2 IMPLANT
PLATE LOCK 7H 92 BILAT FIB (Plate) ×1 IMPLANT
SCREW CORTICAL LOW PROF 3.5X20 (Screw) ×1 IMPLANT
SCREW LOCK CORT STAR 3.5X10 (Screw) ×4 IMPLANT
SCREW LOW PROFILE 12MMX3.5MM (Screw) ×1 IMPLANT
SCREW LOW PROFILE 22MMX3.5MM (Screw) ×1 IMPLANT
SPONGE GAUZE 4X4 12PLY (GAUZE/BANDAGES/DRESSINGS) ×2 IMPLANT
SPONGE LAP 18X18 X RAY DECT (DISPOSABLE) ×2 IMPLANT
STAPLER VISISTAT 35W (STAPLE) ×1 IMPLANT
SUCTION FRAZIER TIP 10 FR DISP (SUCTIONS) ×2 IMPLANT
SUT ETHILON 2 0 PSLX (SUTURE) IMPLANT
SUT VIC AB 2-0 CTB1 (SUTURE) ×3 IMPLANT
SYR CONTROL 10ML LL (SYRINGE) ×1 IMPLANT
TOWEL OR 17X24 6PK STRL BLUE (TOWEL DISPOSABLE) ×2 IMPLANT
TOWEL OR 17X26 10 PK STRL BLUE (TOWEL DISPOSABLE) ×2 IMPLANT
TUBE CONNECTING 12X1/4 (SUCTIONS) ×2 IMPLANT
WATER STERILE IRR 1000ML POUR (IV SOLUTION) ×2 IMPLANT

## 2012-02-03 NOTE — Op Note (Signed)
OPERATIVE REPORT  DATE OF SURGERY: 02/03/2012  PATIENT:  [name],  36 y.o. female  PRE-OPERATIVE DIAGNOSIS:  Fibula Fracture Right Ankle  POST-OPERATIVE DIAGNOSIS:  Fibula Fracture Right Ankle  PROCEDURE:  Procedure(s): OPEN REDUCTION INTERNAL FIXATION (ORIF) ANKLE FRACTURE  SURGEON:  Surgeon(s): Nadara Mustard, MD  ANESTHESIA:   general  EBL:  Min  ML  SPECIMEN:  No Specimen  TOURNIQUET:  * No tourniquets in log *  PROCEDURE DETAILS: Patient is a 36 year old woman who had a supination external rotation injury sustaining a Weber B. fibular fracture on the right. Patient had a non-congruent mortise and presents at this time for open reduction internal fixation. Risks and benefits of surgery were discussed including infection neurovascular injury pain arthritis need for additional surgery. Patient states she understands and wished to proceed at this time. Description of procedure patient brought to the operating room and underwent a general anesthetic. After adequate levels of anesthesia were obtained patient's right lower extremity was prepped using DuraPrep draped into a sterile field. A lateral incision was made this carried sharply down to bone the bone edges were fractured were freshened. Patient had an avulsion chip off the anterior aspect of the fibula. The Weber B. fracture was reduced stabilized with an interfrag screw a anti-glide plate was then placed posterior laterally and this was used to stabilize 2 distal fragments to the proximal construct. A separate screw was then used to stabilize the avulsion fracture off the anterior aspect of the fibula. C-arm fluoroscopy verified reduction verified reduction of the mortise. The wound was irrigated with normal saline subcutaneous is closed using 2-0 Vicryl the skin was closed using staples. The wound was covered with Adaptic orthopedic sponges Kerlix and Coban. Patient was extubated taken to the PACU in stable condition she was  locally infiltrated with 10 cc of quarter percent Marcaine plain. Prescriptions were provided for Percocet followup in the office in 2 weeks  PLAN OF CARE: Discharge to home after PACU  PATIENT DISPOSITION:  PACU - hemodynamically stable.   Nadara Mustard, MD 02/03/2012 1:30 PM

## 2012-02-03 NOTE — H&P (Signed)
[name] is an 36 y.o. female.   Chief Complaint: Right ankle Weber B. fracture HPI: Patient is a 36 year old woman who had a supination external rotation injury sustaining a Weber B. right fibular fracture.  Past Medical History  Diagnosis Date  . Asthma   . Emphysema   . COPD (chronic obstructive pulmonary disease)   . Fibromyalgia, primary   . Anxiety   . Depressed   . Nephrolithiasis   . UTI (lower urinary tract infection)   . Kidney stone     Past Surgical History  Procedure Date  . Abdominal hysterectomy     No family history on file. Social History:  reports that she has been smoking Cigarettes.  She has been smoking about .5 packs per day. She has never used smokeless tobacco. She reports that she drinks alcohol. She reports that she does not use illicit drugs.  Allergies:  Allergies  Allergen Reactions  . Eggs Or Egg-Derived Products Other (See Comments)    Bad pain  . Milk-Related Compounds Other (See Comments)    Bad pain  . Sulfa Antibiotics Nausea And Vomiting, Swelling, Rash and Other (See Comments)    Throat swells    No prescriptions prior to admission    No results found for this or any previous visit (from the past 48 hour(s)). No results found.  Review of Systems  All other systems reviewed and are negative.    Height 5\' 6"  (1.676 m), weight 71.215 kg (157 lb). Physical Exam  On examination patient does have a significant amount of swelling but no blistering. She has a palpable dorsalis pedis pulse. Radiographs shows a displaced Weber B. fibular fracture with non-congruent mortise. Assessment/Plan Assessment: Displaced Weber B. right fibular fracture right ankle.  Plan: Will plan for open reduction internal fixation. Risks and benefits were discussed patient states she understands which pursue this at this time risk including infection neurovascular injury DVT pulmonary embolus need for additional surgery.  DUDA,MARCUS V 02/03/2012, 6:11  AM

## 2012-02-03 NOTE — Transfer of Care (Signed)
Immediate Anesthesia Transfer of Care Note  Patient: [name]  Procedure(s) Performed: Procedure(s) (LRB) with comments: OPEN REDUCTION INTERNAL FIXATION (ORIF) ANKLE FRACTURE (Right) - Open Reduction Internal Fixation Right Ankle  Patient Location: PACU  Anesthesia Type:General  Level of Consciousness: awake, alert  and oriented  Airway & Oxygen Therapy: Patient Spontanous Breathing and Patient connected to nasal cannula oxygen  Post-op Assessment: Report given to PACU RN, Post -op Vital signs reviewed and stable and Patient moving all extremities X 4  Post vital signs: Reviewed and stable  Complications: No apparent anesthesia complications

## 2012-02-03 NOTE — Anesthesia Preprocedure Evaluation (Addendum)
Anesthesia Evaluation  Patient identified by MRN, date of birth, ID band Patient awake    Reviewed: Allergy & Precautions, H&P , NPO status , Patient's Chart, lab work & pertinent test results  Airway Mallampati: II TM Distance: >3 FB Neck ROM: full    Dental  (+) Teeth Intact and Dental Advidsory Given   Pulmonary asthma , COPD COPD inhaler,  breath sounds clear to auscultation        Cardiovascular negative cardio ROS  Rhythm:Regular Rate:Normal     Neuro/Psych Anxiety Depression    GI/Hepatic negative GI ROS, Increased LFTs. Case deemed emergent by Dr. Lajoyce Corners. Case discussed with him.   Endo/Other  negative endocrine ROS  Renal/GU Renal disease     Musculoskeletal   Abdominal   Peds  Hematology negative hematology ROS (+)   Anesthesia Other Findings   Reproductive/Obstetrics                        Anesthesia Physical Anesthesia Plan  ASA: III and emergent  Anesthesia Plan: General   Post-op Pain Management:    Induction: Intravenous  Airway Management Planned: Oral ETT  Additional Equipment:   Intra-op Plan:   Post-operative Plan: Extubation in OR  Informed Consent:   Dental Advisory Given  Plan Discussed with: CRNA, Anesthesiologist and Surgeon  Anesthesia Plan Comments:       Anesthesia Quick Evaluation

## 2012-02-03 NOTE — Anesthesia Procedure Notes (Signed)
Procedure Name: Intubation Date/Time: 02/03/2012 12:38 PM Performed by: Carmela Rima Pre-anesthesia Checklist: Patient identified, Timeout performed, Emergency Drugs available, Suction available and Patient being monitored Patient Re-evaluated:Patient Re-evaluated prior to inductionOxygen Delivery Method: Circle system utilized Preoxygenation: Pre-oxygenation with 100% oxygen Intubation Type: IV induction Ventilation: Mask ventilation without difficulty Laryngoscope Size: Mac and 3 Grade View: Grade I Tube type: Oral Tube size: 7.5 mm Number of attempts: 1 Placement Confirmation: ETT inserted through vocal cords under direct vision,  positive ETCO2 and breath sounds checked- equal and bilateral Secured at: 21 cm Tube secured with: Tape Dental Injury: Teeth and Oropharynx as per pre-operative assessment

## 2012-02-03 NOTE — Anesthesia Postprocedure Evaluation (Signed)
  Anesthesia Post-op Note  Patient: [name]  Procedure(s) Performed: Procedure(s) (LRB) with comments: OPEN REDUCTION INTERNAL FIXATION (ORIF) ANKLE FRACTURE (Right) - Open Reduction Internal Fixation Right Ankle  Patient Location: PACU  Anesthesia Type:General  Level of Consciousness: awake  Airway and Oxygen Therapy: Patient Spontanous Breathing  Post-op Pain: mild  Post-op Assessment: Post-op Vital signs reviewed  Post-op Vital Signs: Reviewed  Complications: No apparent anesthesia complications

## 2012-02-03 NOTE — ED Provider Notes (Signed)
Medical screening examination/treatment/procedure(s) were conducted as a shared visit with non-physician practitioner(s) and myself.  I personally evaluated the patient during the encounter.  Pt has an ankle deformity but no other injuries.  Note a comminuted distal fibula fracture but the films are limited.  Will obtain a CT of the ankle.  If no other injury noted, splint will be placed and she will be discharged home.  Note intact capillary refill, DP and PT pulses.  Tobin Chad, MD 02/03/12 2311

## 2012-02-03 NOTE — Progress Notes (Signed)
Orthopedic Tech Progress Note Patient Details:  [name] [birth year] [id number]  Ortho Devices Type of Ortho Device: Postop boot Ortho Device/Splint Location: RIGHT POST OP SHOE Ortho Device/Splint Interventions: Application   Cammer, Mickie Bail 02/03/2012, 2:37 PM

## 2012-02-03 NOTE — Preoperative (Signed)
Beta Blockers   Reason not to administer Beta Blockers:Not Applicable 

## 2012-02-03 NOTE — Interval H&P Note (Signed)
History and Physical Interval Note:  02/03/2012 12:38 PM  [name]  has presented today for surgery, with the diagnosis of Fibula Fracture Right Ankle  The various methods of treatment have been discussed with the patient and family. After consideration of risks, benefits and other options for treatment, the patient has consented to  Procedure(s) (LRB) with comments: OPEN REDUCTION INTERNAL FIXATION (ORIF) ANKLE FRACTURE (Right) - Open Reduction Internal Fixation Right Ankle as a surgical intervention .  The patient's history has been reviewed, patient examined, no change in status, stable for surgery.  I have reviewed the patient's chart and labs.  Questions were answered to the patient's satisfaction.   Elevated LFT.  Will proceed as urgent surgery and address LFT as outpatient. [name]

## 2012-02-06 ENCOUNTER — Encounter (HOSPITAL_COMMUNITY): Payer: Self-pay | Admitting: Orthopedic Surgery

## 2012-02-06 NOTE — Progress Notes (Signed)
At time of post operative phone call patient reported that the pain would get to a tolerable level but then she would be awoken by severe spasms. Requested patient contact Dr. Audrie Lia office for further instruction regarding same. Deferred further questioning d/t patient discomfort.

## 2012-02-12 ENCOUNTER — Emergency Department (INDEPENDENT_AMBULATORY_CARE_PROVIDER_SITE_OTHER): Payer: Medicaid Other

## 2012-02-12 ENCOUNTER — Emergency Department (INDEPENDENT_AMBULATORY_CARE_PROVIDER_SITE_OTHER)
Admission: EM | Admit: 2012-02-12 | Discharge: 2012-02-12 | Disposition: A | Discharge status: Home / Self Care | Payer: Self-pay | Source: Home / Self Care | Attending: Emergency Medicine | Admitting: Emergency Medicine

## 2012-02-12 ENCOUNTER — Encounter (HOSPITAL_COMMUNITY): Payer: Self-pay | Admitting: Emergency Medicine

## 2012-02-12 DIAGNOSIS — S60219A Contusion of unspecified wrist, initial encounter: Secondary | ICD-10-CM

## 2012-02-12 MED ORDER — OXYCODONE-ACETAMINOPHEN 5-325 MG PO TABS: 1.0000 | ORAL_TABLET | ORAL | Status: DC | PRN

## 2012-02-12 MED ORDER — PROMETHAZINE HCL 25 MG PO TABS: 25.0000 mg | ORAL_TABLET | Freq: Four times a day (QID) | ORAL | Status: DC | PRN

## 2012-02-12 NOTE — ED Provider Notes (Signed)
Medical screening examination/treatment/procedure(s) were performed by non-physician practitioner and as supervising physician I was immediately available for consultation/collaboration.  Kambrea Carrasco, M.D.   Dare Sanger C Caydn Justen, MD 02/12/12 2200 

## 2012-02-12 NOTE — ED Provider Notes (Signed)
History     CSN: [id number]  Arrival date & time 02/12/12  1346   None     Chief Complaint  Patient presents with  . Hand Injury    left hand/wrist injury. fell at 2 a.m this morning hitting wrist on pointed end of sink. wrist is discolored and swollen hard to move thumb    (Consider location/radiation/quality/duration/timing/severity/associated sxs/prior treatment) HPI Comments: Pt fell in bathroom last night after losing balance in cast, caught self on counter corner with L wrist.   Patient is a 36 y.o. female presenting with hand injury. The history is provided by the patient.  Hand Injury  The incident occurred 12 to 24 hours ago. The incident occurred at home. The injury mechanism was a fall. The pain is present in the left wrist. The quality of the pain is described as aching. The pain is at a severity of 9/10. The pain has been constant since the incident. The symptoms are aggravated by movement, use and palpation.    Past Medical History  Diagnosis Date  . Asthma   . Emphysema   . COPD (chronic obstructive pulmonary disease)   . Fibromyalgia, primary   . Anxiety   . Depressed   . Nephrolithiasis   . UTI (lower urinary tract infection)   . Kidney stone     Past Surgical History  Procedure Date  . Abdominal hysterectomy   . Orif ankle fracture 02/03/2012    Procedure: OPEN REDUCTION INTERNAL FIXATION (ORIF) ANKLE FRACTURE;  Surgeon: Nadara Mustard, MD;  Location: [facility] OR;  Service: Orthopedics;  Laterality: Right;  Open Reduction Internal Fixation Right Ankle    History reviewed. No pertinent family history.  History  Substance Use Topics  . Smoking status: Current Every Day Smoker -- 0.5 packs/day    Types: Cigarettes  . Smokeless tobacco: Never Used  . Alcohol Use: Yes     Comment: occasionally    OB History    Grav Para Term Preterm Abortions TAB SAB Ect Mult Living                  Review of Systems  Musculoskeletal: Positive for joint swelling.      L wrist pain and swelling  Skin: Positive for color change.       Bruising   Neurological: Negative for weakness and numbness.    Allergies  Eggs or egg-derived products; Milk-related compounds; and Sulfa antibiotics  Home Medications   Current Outpatient Rx  Name  Route  Sig  Dispense  Refill  . ASPIRIN 81 MG PO TABS   Oral   Take 81 mg by mouth daily.         . IBUPROFEN 200 MG PO TABS   Oral   Take 400 mg by mouth every 6 (six) hours as needed. pain         . OXYCODONE-ACETAMINOPHEN 5-325 MG PO TABS   Oral   Take 1-2 tablets by mouth every 6 (six) hours as needed for pain.   20 tablet   0   . OXYCODONE-ACETAMINOPHEN 5-325 MG PO TABS   Oral   Take 2 tablets by mouth every 4 (four) hours as needed for pain.   15 tablet   0   . OXYCODONE-ACETAMINOPHEN 5-325 MG PO TABS   Oral   Take 1 tablet by mouth every 4 (four) hours as needed for pain.   10 tablet   0   . OXYCODONE-ACETAMINOPHEN 5-325 MG PO TABS  Oral   Take 1 tablet by mouth every 4 (four) hours as needed for pain.   60 tablet   0   . PROMETHAZINE HCL 25 MG PO TABS   Oral   Take 1 tablet (25 mg total) by mouth every 6 (six) hours as needed for nausea.   20 tablet   0   . PROMETHAZINE HCL 25 MG PO TABS   Oral   Take 1 tablet (25 mg total) by mouth every 6 (six) hours as needed for nausea.   12 tablet   0     BP 140/92  Pulse 85  Temp 98.5 F (36.9 C) (Oral)  Resp 20  SpO2 100%  Physical Exam  Constitutional: She appears well-developed and well-nourished. No distress.  Pulmonary/Chest: Effort normal.  Musculoskeletal:       Left wrist: She exhibits decreased range of motion, tenderness, bony tenderness and swelling.  Neurological: No sensory deficit.  Skin: Skin is warm, dry and intact. Ecchymosis noted.    ED Course  Procedures (including critical care time)  Labs Reviewed - No data to display Dg Wrist Complete Left  02/12/2012  *RADIOLOGY REPORT*  Clinical Data: Fall.   Left hand and wrist injury.  LEFT WRIST - COMPLETE 3+ VIEW  Comparison: 07/14/2003.  Findings: Benign-appearing chondroid lesion is present in the distal left radius extending into the radial styloid.  This is unchanged compared to the prior exam of 07/14/2003.  Carpal spacing and alignment appears within normal limits.  The soft tissues are normal.  Scaphoid bone appears intact.  IMPRESSION: No acute osseous abnormality.   Original Report Authenticated By: Andreas Newport, M.D.      1. Contusion, wrist       MDM          Cathlyn Parsons, NP 02/12/12 [phone]

## 2012-02-12 NOTE — ED Notes (Signed)
Pt states that 2 a.m this morning she was trying to catch self on sink and hit left wrist on pointed end. Having pain in wrist and hard to move thumb. Bruising is noted.

## 2012-02-15 ENCOUNTER — Telehealth: Payer: Self-pay | Admitting: *Deleted

## 2012-02-15 NOTE — Telephone Encounter (Signed)
Patient has a new patient appt here with Dr. Pollie Meyer on 03/12/12.  Had recent surgery by Dr. Lajoyce Corners for ankle fracture. During hospital stay, patient had "significantly elevated liver enzymes---5 times normal amount."  Was previously seen in ED for lower quadrant and back pain with urinary symptoms/possible kidney stone.  Dr. Lajoyce Corners would like for patient to be seen sooner than 03/12/12 to follow up on liver tests.  Changed appt to 02/20/12 at 3:30pm.  Informed Autumn at Dr. Audrie Lia office and she will notify patient.  Gaylene Brooks, RN

## 2012-02-20 ENCOUNTER — Encounter: Payer: Self-pay | Admitting: Family Medicine

## 2012-02-20 ENCOUNTER — Ambulatory Visit (INDEPENDENT_AMBULATORY_CARE_PROVIDER_SITE_OTHER): Payer: Medicaid Other | Admitting: Family Medicine

## 2012-02-20 VITALS — BP 128/88 | HR 82 | Ht 66.0 in | Wt 158.0 lb

## 2012-02-20 DIAGNOSIS — R748 Abnormal levels of other serum enzymes: Secondary | ICD-10-CM

## 2012-02-20 DIAGNOSIS — J45909 Unspecified asthma, uncomplicated: Secondary | ICD-10-CM | POA: Insufficient documentation

## 2012-02-20 LAB — POCT URINALYSIS DIPSTICK
Bilirubin, UA: NEGATIVE
Blood, UA: NEGATIVE
Nitrite, UA: POSITIVE
Spec Grav, UA: 1.025
pH, UA: 7

## 2012-02-20 LAB — PROTIME-INR
INR: 0.9 (ref <1.50)
Prothrombin Time: 12.2 seconds (ref 11.6–15.2)

## 2012-02-20 LAB — TSH: TSH: 1.054 u[IU]/mL (ref 0.350–4.500)

## 2012-02-20 MED ORDER — ALBUTEROL SULFATE HFA 108 (90 BASE) MCG/ACT IN AERS: 2.0000 | INHALATION_SPRAY | RESPIRATORY_TRACT | Status: DC | PRN

## 2012-02-20 NOTE — Assessment & Plan Note (Addendum)
Patient with elevated liver enzymes detected on routine pre-operative screening prior to repair of ankle fracture earlier this month. No signs or symptoms of fulminant hepatic failure, but there is hepatomegaly on exam.   Differential diagnosis is wide but includes alcohol-induced liver disease, drug or alcohol related hepatic toxicity, acute viral hepatitis, alpha-1 antitrypsin deficiency, and non-alcoholic fatty liver disease, among other things. Of these, the history and exam seem to most favor viral hepatitis (although patient is without significant risk factors) or alpha-1 anti trypsin deficiency given history of "emphysema" diagnosed via CXR years ago.  Will perform initial workup to include: urinalysis, CBC with diff, CMET, PT/INR, TSH, acute hepatitis panel, and alpha-1-antitrypsin level. Will also obtain ultrasound of the abdomen to evaluate appearance of liver and other abdominal structures.  Patient is quite anxious about these test results, and requests that I call her when I get her results. She shares a cell phone with her fiance, Conley Rolls, and says that I have her permission to give any results to him over the phone if she is not available. She will follow up in clinic on Dec 13 at 3:30pm.  This patient was precepted with Dr. Sarah [place]iland.

## 2012-02-20 NOTE — Assessment & Plan Note (Signed)
Patient reports history of asthma that she had primarily as a teenager. She hasn't had to use any albuterol in some time but is still symptomatic if she tries to exercise. Will give her an rx for an albuterol inhaler to have on hand in case she needs it.

## 2012-02-20 NOTE — Patient Instructions (Signed)
It was nice to meet you today. I am sorry you have been feeling badly.   We have ordered several tests, which may take several days to come back. I will call you as soon as I have any important results back. In the meantime, avoid sharing any razors or toothbrushes, and use condoms to prevent sharing bodily fluids.  We will call you to schedule your ultrasound. I will see you back on December 13 at 3:30pm.  Please call if you have any questions or concerns.

## 2012-02-20 NOTE — Progress Notes (Addendum)
Patient ID: [name], female   DOB: 11-17-1975, 36 y.o.   MRN: [id number]  HPI: [name] is a 36 y.o. female here to establish care and to be evaluated for elevated liver enzymes. She is accompanied by her fiance, Conley Rolls. Patient recently broke her ankle and was seen by Dr. Lajoyce Corners of orthopedics, who discovered on routine preoperative evaluation that patient's LFTs were quite elevated. Dr. Lajoyce Corners referred her to our clinic so that she might have further workup, and if necessary, refer to a specialist.   She is very anxious today, and worried about having these elevated liver enzymes. She is concerned that she might have hepatitis. Her father was an alcoholic and died 8 years ago of cirrhosis of the liver, and she was told after he died not to touch his body because he had hepatitis, which she did not previously know. She was present with him in the hospital a lot at the end of his life, and she is worried that she could have gotten stuck or cut with something of his, because he was agitated at times. She denies a history of personal IV drug use. She does have tattoos but says that she got them at reputable tattoo shops.   Since around March of this year, she has had progressive decrease in her energy. She has been nauseated "all the time" and reports that she has lost weight (from 180 to 158 lb) without trying. There was a time when she was eating a lot and still lost weight. She has been thirsty recently. Her urine has also gotten progressively darker, and she says it is currently brown. She has been urinating less as well. She has had fewer bowel movements, which is abnormal for her because she usually has looser stools. No rashes or jaundice. She did have a lot of trouble sleeping over the summer.  She has had pain in her right side. She says it wraps around her back to her front, and she thought before that it felt similar to kidney stones. Hasn't noticed any fullness in her belly. She has been  getting this pain about three times per day, and it lasts from seconds to minutes. Sometimes it is so sharp that it makes her jump. Has constantly been nauseated recently. She has noticed a heavy feeling in her right side when she lays down flat.  No recent travel out of the country. No current prescription medications. Hasn't taken a lot of tylenol (she says she uses ibuprofen a lot). Has been taking aspirin for pain and to prevent a blood clot in her foot after her surgery. Denies taking any recent supplements or any other medications except for B12 and fish oil for 1-2 days last week when she and her fiance were trying to be healthier. Denies current excessive alcohol consumption - see social history below. Denies that she was using any substances when she hurt her ankle recently - she just rolled her ankle when she stepped off the sidewalk. She says she is up to date on all of her vaccinations.   ROS: See HPI  Past Medical History: -Asthma - mostly as a child but notices it if she tries to exercise. Does not currently have any albuterol at home -Depression/Bipolar Disorder - says she has been on multiple psychiatric medications in the past and has been hospitalized for this before. Is not currently on any medications. Denies any current SI/HI. Was on lithium before. Last psych meds she took were klonopin and  neurontin. Says her mood has been all right recently, in contrast to how bad it used to be in the past. She has been anxious about her liver enzymes being elevated. -Kidney stones - thinks she has had kidney stones 4-5 times. Says she is naturally not someone who drinks a lot of fluids. -Fibromyalgia - previously on gabapentin, but stopped it recently within the last year. -"Emphysema" - says she was diagnosed with this at an urgent care facility when she had a chest x ray for pneumonia, in the early 2000s  Social History: Completed some high school. Has a fiance Conley Rolls) who brings her to  appointments. Unemployed. No regular exercise. Feels safe in her relationships.   Has smoked cigarettes since she was 36 years old, currently doing 1/2 to 1 pack every two days. Denies recreational drug use currently or a history of ever using IV drugs. Occasionally drinks a couple drinks about twice per week. There was a time where she drank 2-3 drinks either every day or every other day for about three years during 2009-2012. (Note: after reviewing prior records in [facility], patient has had multiple admissions to behavioral health, several of which mention alcohol abuse, and at least one of which was for alcohol detoxification. Patient did not share this information during our visit. )  Has four children (ages 20, 68, 10, 48). The 32 year old lives with pt's sister in [place] so that they can continue to go to school at the same place. The 35 year old recently moved in with her two months ago, but had previously been living with pt's sister as well (moved in as there was conflict between child & pt's sister). The other two kids are adults and live on their own.  Surgical History: -Hysterectomy - still has ovaries. Due to "cysts" and excessive bleeding. -Ankle repair - Nov 8 of this month.  Allergies: Sulfa drugs - cause hives/anaphylaxis Eggs and milk - upset her stomach  Family History: -Dad with alcohol and drug abuse, cancer (unknown type), depression and anxiety, kidney disease (after liver failed), died of cirrhosis about 8 years ago, was positive for a type of hepatitis. -Mom with heart attack and hypertension -Younger sister with hyperthyroidism -Brother with renal disease (doesn't know what type, doesn't talk to him) -Sister with bipolar disorder  Health Maintenance: -Last pap smear was 11 years ago. Thinks she has had an abnormal one in the past. -Hasn't gotten her flu shot in recent years  PHYSICAL EXAM: BP 128/88  Pulse 82  Ht 5\' 6"  (1.676 m)  Wt 158 lb (71.668 kg)  BMI  25.50 kg/m2 Gen: NAD, anxious appearing HEENT: moist mucous membranes. TMs clear bilaterally. No oral exudates. No cervical or supraclavicular lymphadenopathy. No nasal discharge. Heart: RRR, no murmurs auscultated Lungs: CTAB, normal respiratory effort Abd: +BS. Abdomen is mildly tender to palpation in the right upper quadrant, with referred pain to the right upper flank. Liver measured with scratch test and comes approximately 4 cm below costal margin in the mid clavicular plane and extends medially to the midline. No abdominal distention or tenderness to palpation in any other quadrants. No CVA tenderness. Ext: no lower extremity edema Neuro: nonfocal, speech normal, alert and oriented Psych: appears anxious about liver test results. Full range of affect. Occasionally tearful. Denies SI/HI. Speech normal in rate and volume.

## 2012-02-21 ENCOUNTER — Encounter: Payer: Self-pay | Admitting: Family Medicine

## 2012-02-21 ENCOUNTER — Other Ambulatory Visit: Payer: Self-pay | Admitting: Family Medicine

## 2012-02-21 ENCOUNTER — Telehealth: Payer: Self-pay | Admitting: Family Medicine

## 2012-02-21 DIAGNOSIS — R768 Other specified abnormal immunological findings in serum: Secondary | ICD-10-CM

## 2012-02-21 DIAGNOSIS — B182 Chronic viral hepatitis C: Secondary | ICD-10-CM | POA: Insufficient documentation

## 2012-02-21 DIAGNOSIS — B1920 Unspecified viral hepatitis C without hepatic coma: Secondary | ICD-10-CM

## 2012-02-21 LAB — CBC WITH DIFFERENTIAL/PLATELET
Basophils Relative: 1 % (ref 0–1)
Eosinophils Absolute: 0.2 10*3/uL (ref 0.0–0.7)
Eosinophils Relative: 6 % — ABNORMAL HIGH (ref 0–5)
HCT: 39.6 % (ref 36.0–46.0)
Hemoglobin: 13.8 g/dL (ref 12.0–15.0)
MCH: 31.4 pg (ref 26.0–34.0)
MCHC: 34.8 g/dL (ref 30.0–36.0)
MCV: 90 fL (ref 78.0–100.0)
Monocytes Absolute: 0.4 10*3/uL (ref 0.1–1.0)
Monocytes Relative: 10 % (ref 3–12)
Neutrophils Relative %: 50 % (ref 43–77)

## 2012-02-21 LAB — COMPREHENSIVE METABOLIC PANEL
Alkaline Phosphatase: 173 U/L — ABNORMAL HIGH (ref 39–117)
BUN: 22 mg/dL (ref 6–23)
CO2: 29 mEq/L (ref 19–32)
Creat: 0.82 mg/dL (ref 0.50–1.10)
Glucose, Bld: 103 mg/dL — ABNORMAL HIGH (ref 70–99)
Total Bilirubin: 0.9 mg/dL (ref 0.3–1.2)

## 2012-02-21 LAB — HEPATITIS PANEL, ACUTE
Hep A IgM: NEGATIVE
Hep B C IgM: NEGATIVE

## 2012-02-21 NOTE — Telephone Encounter (Signed)
Called patient on her cell phone around 4:15pm today to give her the results I have back so far.  Told her that she tested positive for Hepatitis C. She was understandably upset, but seemed to take the news fairly well. She will come in tomorrow at 3pm for a lab appointment to get more labs. We will obtain: -HCV qualitative PCR -HCV quantitative RNA -HCV genotype -HIV -RPR -CMET  Will also start the process of referring her to Hepatitis C clinic.  Explained to her that she should NOT drink any alcohol or take any tylenol until we get all of these tests back. Also reiterated that she should use condoms with her partner and not share any toothbrushes, razors, etc.  Offered to talk to her again if she comes up with questions as she processes this news. If she has any jaundice or scleral icterus over the coming days, she is to go to the ER. Pt has a hx of mental health problems (depression vs. Bipolar), so also told her that if she becomes unsafe then she should come to the ER. She understands these instructions.  Has f/u appt scheduled for 03/09/12 at 3:30pm. 

## 2012-02-21 NOTE — Assessment & Plan Note (Signed)
Called patient on her cell phone around 4:15pm today to give her the results I have back so far.  Told her that she tested positive for Hepatitis C. She was understandably upset, but seemed to take the news fairly well. She will come in tomorrow at 3pm for a lab appointment to get more labs. We will obtain: -HCV qualitative PCR -HCV quantitative RNA -HCV genotype -HIV -RPR -CMET  Will also start the process of referring her to Hepatitis C clinic.  Explained to her that she should NOT drink any alcohol or take any tylenol until we get all of these tests back. Also reiterated that she should use condoms with her partner and not share any toothbrushes, razors, etc.  Offered to talk to her again if she comes up with questions as she processes this news. If she has any jaundice or scleral icterus over the coming days, she is to go to the ER. Pt has a hx of mental health problems (depression vs. Bipolar), so also told her that if she becomes unsafe then she should come to the ER. She understands these instructions.  Has f/u appt scheduled for 03/09/12 at 3:30pm.

## 2012-02-22 ENCOUNTER — Telehealth: Payer: Self-pay | Admitting: Family Medicine

## 2012-02-22 ENCOUNTER — Other Ambulatory Visit: Payer: Medicaid Other

## 2012-02-22 ENCOUNTER — Telehealth: Payer: Self-pay | Admitting: *Deleted

## 2012-02-22 DIAGNOSIS — B1920 Unspecified viral hepatitis C without hepatic coma: Secondary | ICD-10-CM

## 2012-02-22 NOTE — Assessment & Plan Note (Signed)
Called patient to follow up on how she's doing. She says she is doing as well as can be expected, and was very appreciative of the phone call.  She rescheduled her lab appointment for Monday 12/2. She was supposed to get her labs drawn today (HCV genotyping, PCR, etc) which were to include a CMET to be sure her liver enzymes aren't continuing to rise. Since she's rescheduled this and we won't know her labs until Monday, and our clinic is closed until then for the holiday weekend, I have counseled her that if she starts to feel bad, notices her skin or eyes turning yellow, can't eat or drink, or has a gut feeling that something is wrong, that she should go to the emergency room.   Also counseled her that she should not take any ibuprofen or aspirin, and to avoid other medications, until we get her diagnosis and testing sorted out. She says she has already stopped taking those medications. She has an abdominal ultrasound appointment scheduled on Monday morning, after which she'll get her labs drawn here in clinic. I told her I will call her with the results of the ultrasound and those labs.   We are having a little trouble with her referral to the hep C specialist because I am not on her medicaid card and thus cannot yet make the referral. She is working to get her card switched. I have offered to help in whatever way I can with getting this switched if she runs into barriers.  She understands all this, and knows to call us with questions or go to the ER with anything urgent. 

## 2012-02-22 NOTE — Telephone Encounter (Signed)
Called patient to inform her she needs to have her medicaid card changed so that we can refer her to specialist. Patient says she was already made aware of this by Dr Pollie Meyer and is going to work on it.[name], Rodena Medin

## 2012-02-22 NOTE — Telephone Encounter (Signed)
Called patient to follow up on how she's doing. She says she is doing as well as can be expected, and was very appreciative of the phone call.  She rescheduled her lab appointment for Monday 12/2. She was supposed to get her labs drawn today (HCV genotyping, PCR, etc) which were to include a CMET to be sure her liver enzymes aren't continuing to rise. Since she's rescheduled this and we won't know her labs until Monday, and our clinic is closed until then for the holiday weekend, I have counseled her that if she starts to feel bad, notices her skin or eyes turning yellow, can't eat or drink, or has a gut feeling that something is wrong, that she should go to the emergency room.   Also counseled her that she should not take any ibuprofen or aspirin, and to avoid other medications, until we get her diagnosis and testing sorted out. She says she has already stopped taking those medications. She has an abdominal ultrasound appointment scheduled on Monday morning, after which she'll get her labs drawn here in clinic. I told her I will call her with the results of the ultrasound and those labs.   We are having a little trouble with her referral to the hep C specialist because I am not on her medicaid card and thus cannot yet make the referral. She is working to get her card switched. I have offered to help in whatever way I can with getting this switched if she runs into barriers.  She understands all this, and knows to call us with questions or go to the ER with anything urgent.

## 2012-02-27 ENCOUNTER — Other Ambulatory Visit: Payer: Medicaid Other

## 2012-02-27 ENCOUNTER — Ambulatory Visit
Admission: RE | Admit: 2012-02-27 | Discharge: 2012-02-27 | Disposition: A | Discharge status: Home / Self Care | Payer: Medicaid Other | Source: Ambulatory Visit | Attending: Family Medicine | Admitting: Family Medicine

## 2012-02-27 ENCOUNTER — Telehealth: Payer: Self-pay | Admitting: Family Medicine

## 2012-02-27 DIAGNOSIS — R748 Abnormal levels of other serum enzymes: Secondary | ICD-10-CM

## 2012-02-27 NOTE — Telephone Encounter (Signed)
Pt had her US done today and is questioning why she needs to come in for labs - wants to know results of [place] and to speak to Dr Pollie Meyer today or tomorrow.

## 2012-02-27 NOTE — Telephone Encounter (Signed)
Attempted to call patient, with no answer or voicemail. Will try again tomorrow as it is past 7pm now.  She needs to have her labs drawn to further characterize her Hepatitis C. A specialist will want these labs done before they see her. She also needs to have her liver function tests repeated to see if they are continuing to rise. Discussed need to repeat LFTs with pt on the phone on Wed 11/27, would be happy to speak with her again if she calls back.

## 2012-02-27 NOTE — Telephone Encounter (Signed)
Fwd. To PCP to address  

## 2012-02-28 ENCOUNTER — Other Ambulatory Visit: Payer: Self-pay | Admitting: Family Medicine

## 2012-02-28 ENCOUNTER — Other Ambulatory Visit: Payer: Medicaid Other

## 2012-02-28 DIAGNOSIS — R768 Other specified abnormal immunological findings in serum: Secondary | ICD-10-CM

## 2012-02-28 NOTE — Telephone Encounter (Signed)
Patient called back last night. I spoke with her and answered her questions as to why she needs to get more labs done. She will come in this morning at 9:30a for a lab appointment.  Also gave her the results of her ultrasound. She continues to have some abdominal pain, which she says is now constant, but is able to eat and drink. Reviewed again with her reasons to go to the ER (jaundice, unable to keep PO). We are still working on getting her Medicaid card switched to the [facility] so that we can refer her to a Hep C specialist.

## 2012-02-28 NOTE — Progress Notes (Signed)
LABS DONE TODAY [name] 

## 2012-02-29 LAB — COMPREHENSIVE METABOLIC PANEL
AST: 234 U/L — ABNORMAL HIGH (ref 0–37)
Albumin: 4.1 g/dL (ref 3.5–5.2)
Alkaline Phosphatase: 110 U/L (ref 39–117)
BUN: 16 mg/dL (ref 6–23)
Calcium: 9.3 mg/dL (ref 8.4–10.5)
Chloride: 104 mEq/L (ref 96–112)
Glucose, Bld: 91 mg/dL (ref 70–99)
Potassium: 4.2 mEq/L (ref 3.5–5.3)
Sodium: 140 mEq/L (ref 135–145)
Total Protein: 6.5 g/dL (ref 6.0–8.3)

## 2012-02-29 LAB — HCV RNA QUANT RFLX ULTRA OR GENOTYP: HCV Quantitative: 3961832 IU/mL — ABNORMAL HIGH (ref <15)

## 2012-02-29 LAB — RPR

## 2012-03-09 ENCOUNTER — Ambulatory Visit: Payer: Medicaid Other | Admitting: Family Medicine

## 2012-03-09 ENCOUNTER — Telehealth: Payer: Self-pay | Admitting: Family Medicine

## 2012-03-09 DIAGNOSIS — B1920 Unspecified viral hepatitis C without hepatic coma: Secondary | ICD-10-CM

## 2012-03-09 NOTE — Telephone Encounter (Signed)
Called patient just now to check on her after she no showed to today's appointment. Pt reports that she tried to call yesterday and then again today to reschedule her appointment, but says she couldn't get through on the phone. Her fiance has a fever and she can't drive because of recent foot surgery.  She continues to feel tired, but overall is about the same, maybe just a little better.  Gave her the results of her confirmatory HCV test (positive viral load) and that her HIV and RPR were negative, which she was glad to hear.  She did go through the process of getting her Medicaid Card switched, and after speaking to our referral coordinator, this should officially come through on January 1, at which time we can refer her to Hep C clinic.  Rescheduled an appointment for her on 12/27 at 3:30pm. Will plan to repeat LFTs at that time. 

## 2012-03-09 NOTE — Assessment & Plan Note (Addendum)
Called patient just now to check on her after she no showed to today's appointment. Pt reports that she tried to call yesterday and then again today to reschedule her appointment, but says she couldn't get through on the phone. Her fiance has a fever and she can't drive because of recent foot surgery.  She continues to feel tired, but overall is about the same, maybe just a little better.  Gave her the results of her confirmatory HCV test (positive viral load) and that her HIV and RPR were negative, which she was glad to hear.  She did go through the process of getting her Medicaid Card switched, and after speaking to our referral coordinator, this should officially come through on January 1, at which time we can refer her to Hep C clinic.  Rescheduled an appointment for her on 12/27 at 3:30pm. Will plan to repeat LFTs at that time.

## 2012-03-12 ENCOUNTER — Ambulatory Visit: Payer: Medicaid Other | Admitting: Family Medicine

## 2012-03-23 ENCOUNTER — Encounter: Payer: Self-pay | Admitting: Family Medicine

## 2012-03-23 ENCOUNTER — Ambulatory Visit (INDEPENDENT_AMBULATORY_CARE_PROVIDER_SITE_OTHER): Payer: Medicaid Other | Admitting: Family Medicine

## 2012-03-23 VITALS — BP 125/87 | HR 82 | Temp 98.1°F | Ht 66.0 in | Wt 157.4 lb

## 2012-03-23 DIAGNOSIS — B1920 Unspecified viral hepatitis C without hepatic coma: Secondary | ICD-10-CM

## 2012-03-23 DIAGNOSIS — R110 Nausea: Secondary | ICD-10-CM | POA: Insufficient documentation

## 2012-03-23 DIAGNOSIS — F319 Bipolar disorder, unspecified: Secondary | ICD-10-CM

## 2012-03-23 DIAGNOSIS — J438 Other emphysema: Secondary | ICD-10-CM

## 2012-03-23 DIAGNOSIS — J439 Emphysema, unspecified: Secondary | ICD-10-CM

## 2012-03-23 MED ORDER — ONDANSETRON HCL 4 MG PO TABS: 4.0000 mg | ORAL_TABLET | Freq: Three times a day (TID) | ORAL | Status: DC | PRN

## 2012-03-23 NOTE — Patient Instructions (Addendum)
It was great to see you again today!  On Monday, call first thing to get in to see Mental Health. If they cannot see you quickly, call our clinic to be seen some time next week. If you have any thoughts of hurting yourself or anyone else, go to the ER immediately. Likewise, if you feel like your moods are out of control, go to the ER to stay safe.  I am sending in some Zofran to help with your nausea. We'll work on the referral process to the hepatitis clinic as soon as we get the Medicaid stuff straightened out.  I'll see you back in one month to recheck your liver labs.  Have a Happy New Year! -Dr. Pollie Meyer

## 2012-03-23 NOTE — Progress Notes (Signed)
Patient ID: [name], female   DOB: June 27, 1975, 36 y.o.   MRN: [id number]  HPI: [name] is a 36 y.o. female here to follow up on her abnormal liver tests.  Hepatitis C: Last visit we did labwork and ended up diagnosing her with acute hepatitis C. We are awaiting getting her medicaid card switched so that we can refer her to a Hep C specialist. She was previously having some significant abdominal pain and nausea, and is feeling somewhat better now. She still struggles with nausea and fatigue, but her pain has gotten better and her urine has returned to a normal color. She and her fiance both had the flu recently, and she felt very bad when that happened but is feeling better now. She did take some aleve at that time. I have cautioned her, and continue to caution her, not to use tylenol or nsaids for pain due to her hep c.  "Emphysema": Pt has hx of asthma and reports that she was told she had "emphysema" based on a prior chest xray. Wants to know if this was a real diagnosis. We did test her for alpha-1-antitrypsin deficiency due to this reported hx and her elevated LFTs, and this was negative. I gave her an rx for albuterol last time so that she'd have it on hand if she needed it, although she never got it filled because she didn't realize it was at her pharmacy. Says she could have used it when she had the flu.  Bipolar Disorder: Patient has a hx of bipolar disorder, requiring admission to behavioral health in the past, but is not currently on any psychoactive medications. She states she has been dealing with mental health problems since she was a teenager. She was last on klonopin and gabapentin, but says she stopped these on her own because of how they made her feel. She has not been on meds for some time. She reports that her moods have been getting worse recently, with the stress of being diagnosed with Hep C. She had considerable anxiety leading up to our last visit, because she did not  know what was wrong with her liver and was highly worried about it. She seems to have less anxiety now, but is struggling with the stress of this new diagnosis and all the implications it has on her family life. She says she is very aware of her own mood status, and that in the past when she has required admission to inpatient psychiatry, it was always voluntary admission, which she herself sought out. She reports that she can tell that her "bipolar is coming back" and that she feels like she needs to get plugged back in with mental health care. When she has been manic in the past, her mania is more of an irritated mania than a grandiose mania, and she has always known to seek treatment.   She has previously been seen at [facility] and feels like she knows how to get back into the mental health system. She does not feel like she needs a medication emergently, or needs admission to the hospital, but just that she can feel herself moving toward that direction and knows that she needs to take action. She denies any thoughts of hurting herself or anyone else. Her primary symptoms that she reports are emotional lability and generally feeling "down and out". She has been sleeping alright, but does have trouble with racing thoughts when she lays down at night to sleep.  ROS: See HPI  PMH: pt has had a hysterectomy previously, which was not documented in our system under "pregnancy status" (but was documented under surgical history). She would like it documented under pregnancy status so that she will not be continually asked when her LMP was, as her fiance accompanies her to visits and she has not told him about having had a hysterectomy. She is okay with him knowing about her medical problems, with the exception of this surgery. Will document appropriately under "pregnancy status" and under an FYI note as a "release restriction".  PHYSICAL EXAM: BP 125/87  Pulse 82  Temp 98.1 F (36.7 C) (Oral)  Ht 5\' 6"  (1.676  m)  Wt 157 lb 6.4 oz (71.396 kg)  BMI 25.40 kg/m2 Gen: NAD, pleasant, cooperative, well groomed and nourished HEENT: no scleral icterus appreciated Heart: RRR Lungs: CTAB Abd: soft, nontender, nondistended, no masses palpable Skin: no jaundice Psych: affect with full range and is mood congruent, mood somewhat distressed at times, denies SI/HI, does not appear to be responding to internal stimuli, speech normal in rate and volume, thought process is linear and logical, insight good, judgment good Neuro: grossly nonfocal

## 2012-03-24 DIAGNOSIS — F319 Bipolar disorder, unspecified: Secondary | ICD-10-CM | POA: Insufficient documentation

## 2012-03-24 NOTE — Assessment & Plan Note (Addendum)
Pt overall feeling better, still with some nausea. Waiting to refer to Hep C specialist, which we should be able to do after January 1st. LFTs were trending down last time we checked them, so will hold off on repeating them given clinical improvement. Plan for f/u with me in 1 month to repeat LFTs and see how she's doing. Will rx zofran for help with nausea, as after reviewing the literature this appears to be safe in Hep C. Precepted with Dr. Leveda Anna.

## 2012-03-24 NOTE — Assessment & Plan Note (Addendum)
Pt currently not on any psychiatric medications. Feels as if her moods are approaching the point where she needs to get plugged back into the mental health care system. No SI or HI, and thought process is currently linear, without pressured speech. Patient seems to have good insight into her illness and is aware of how her current stressors (new diagnosis of hepatitis C, primarily) are impacting her.   I do not feel that she requires psychiatric hospitalization or emergent initiation of medication, but she should be seen by an outpatient mental health provider as soon as possible. Given her history of psychiatric hospitalizations and psychiatric history since her teenage years, she would likely be best served by seeing a psychiatrist for evaluation and treatment. I would like to defer medication choice to a psychiatric specialist, rather than start her on a mood stabilizer or other medication today, that may end up being changed by a psychiatrist in the near future.  She is aware of the resources available to her and (as it is currently Friday afternoon) is willing to call Mental Health first thing on Monday morning to see if she can be seen as a work-in. If she cannot get in there soon, she will call our clinic on Monday for a same-day appointment to be seen next week. She is aware of the emergency psychiatric line and knows to go to the ER if she has thoughts of hurting herself or anyone else.  Patient was discussed with and seen by Dr. Leveda Anna, who agrees with this assessment and plan.

## 2012-03-24 NOTE — Assessment & Plan Note (Signed)
Likely secondary to acute hep C with elevated LFTs. See "hepatits c" problem above for further details. Will rx zofran for nausea. F/u in 1 month.

## 2012-03-24 NOTE — Assessment & Plan Note (Signed)
I am not sure patient actually has a diagnosis of emphysema - she reports this as a diagnosis given to her based on an old chest x ray. Reviewed her most recent CXR with Dr. Leveda Anna today (from 2012) and it does not appear very hyperinflated to either of [place]. She is also quite young to have "emphysema". We did test her for alpha-1-antitrypsin deficiency last time due to this history and LFTs, and her A1AT level was normal.   She does report a history of asthma, for which I rx'd an albuterol inhaler at last visit, which she hasn't filled yet. Encouraged her to fill this so she has it on hand in case she needs it.

## 2012-04-09 ENCOUNTER — Telehealth: Payer: Self-pay | Admitting: Family Medicine

## 2012-04-09 NOTE — Telephone Encounter (Signed)
Called patient back on the phone. She wanted to confirm that she physically needs to bring her card in, as it is difficult for her to travel here [place]. I will talk with our referral coordinator to see if she needs to bring it here to [place], or if there is another way to get the card information.  Additionally, she says she has not been to mental health yet. She says she is doing okay and "hanging in there". Denies SI/HI. I told her I have appointments available tomorrow, as well as Thursday and Friday of this week. She did not want to schedule an appointment right at this minute.  Will either call her back myself or have a nurse call her back regarding what to do about the Medicaid card.

## 2012-04-09 NOTE — Telephone Encounter (Signed)
Spoke with [name].  Informed her that she does not have to bring Medicaid Card into office.  Per [employer] [employer] her card has been changed and her referral has been faxed to [facility].  They will contact her with an appointment.  Patient states understanding.  [name]

## 2012-04-09 NOTE — Telephone Encounter (Signed)
Pt is calling in regards to the referral to liver specialist and would like to speak to Dr. Pollie Meyer concerning this. She has received her new MCD card and would like for the referral information to be sent now. I told her that she will need to bring in her card so that it can be scanned into her chart and we can proceed with the referral. I will forward this to her pcp.Laureen Ochs, Viann Shove

## 2012-04-29 IMAGING — CR DG CHEST 2V
2 series · 2 of 2 positions shown · non-contrast
Comparison: 10/02/2008.

CLINICAL DATA: Chest pain.  Nausea.  Smoker.

CHEST - 2 VIEW

[w chest pa]
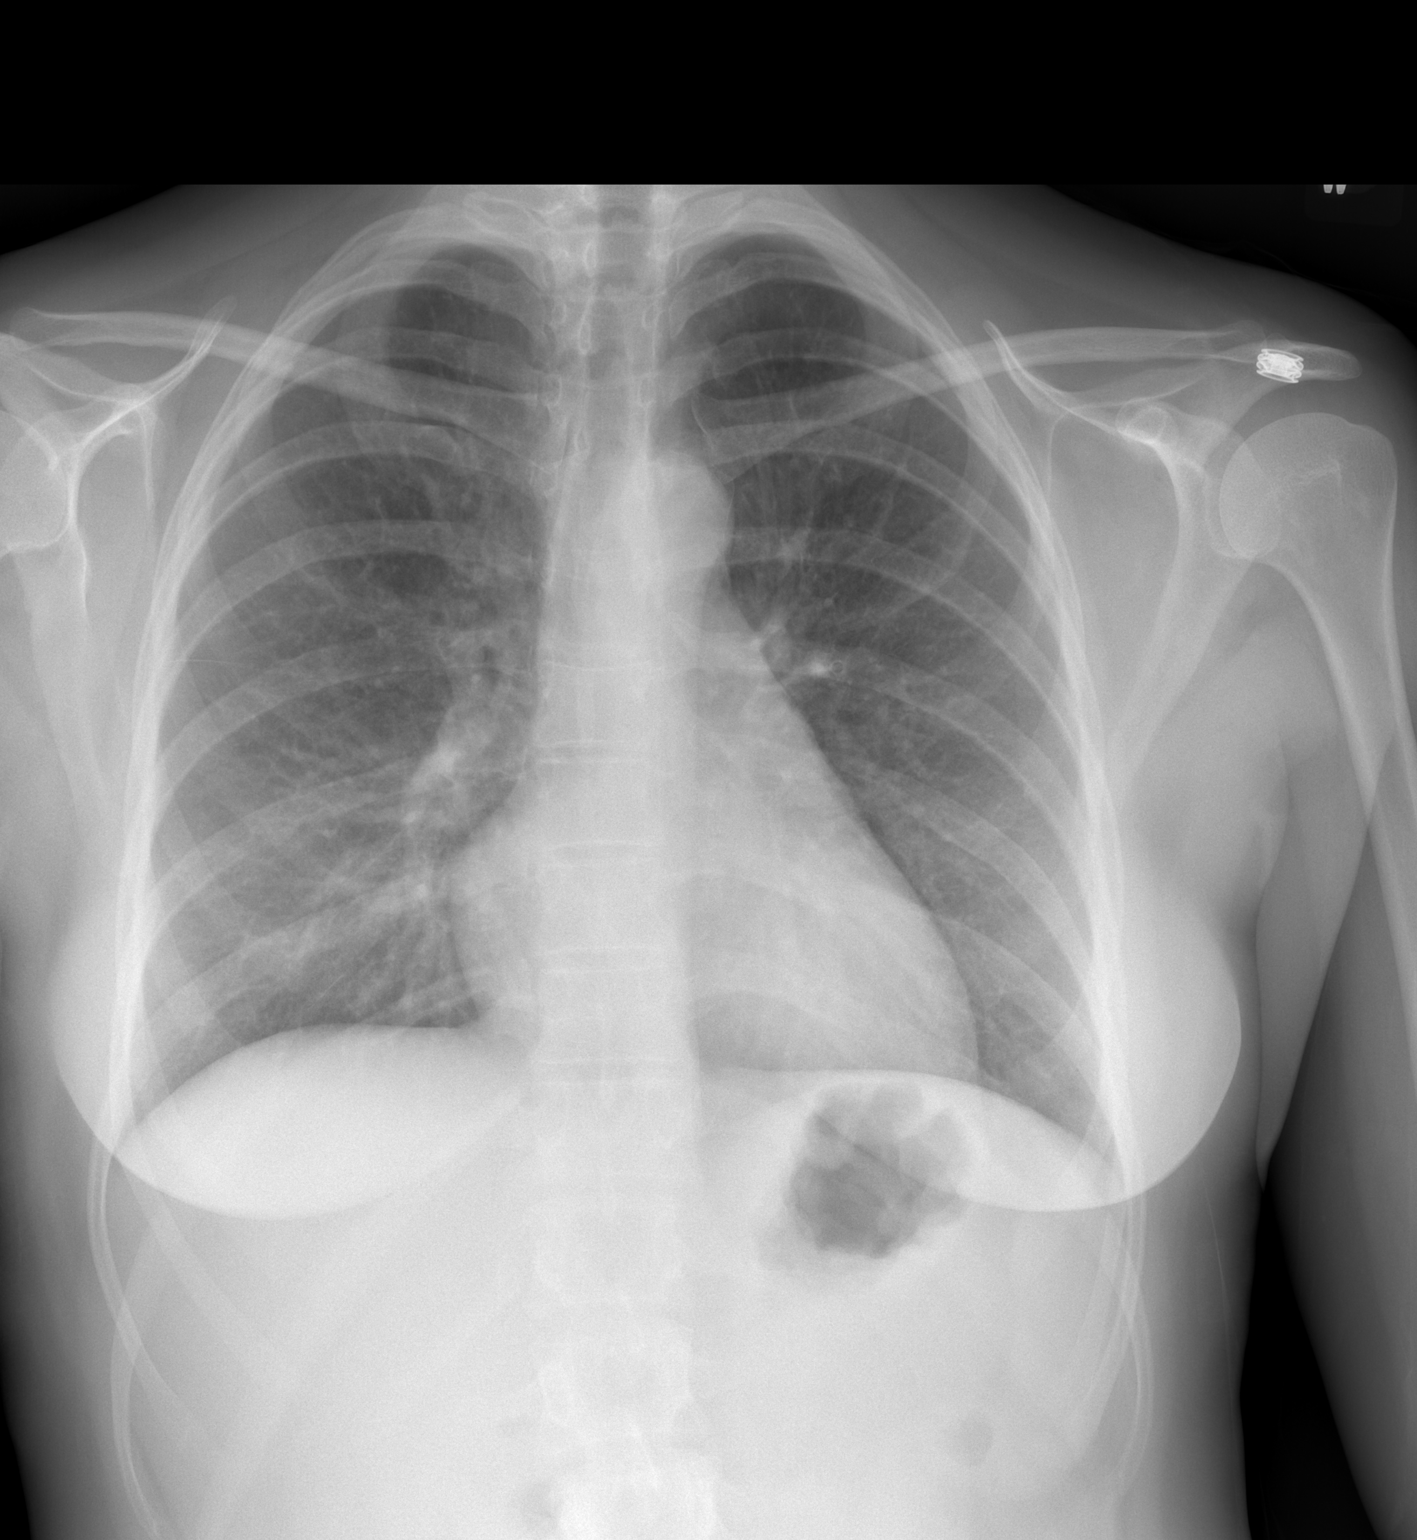

[w chest lat]
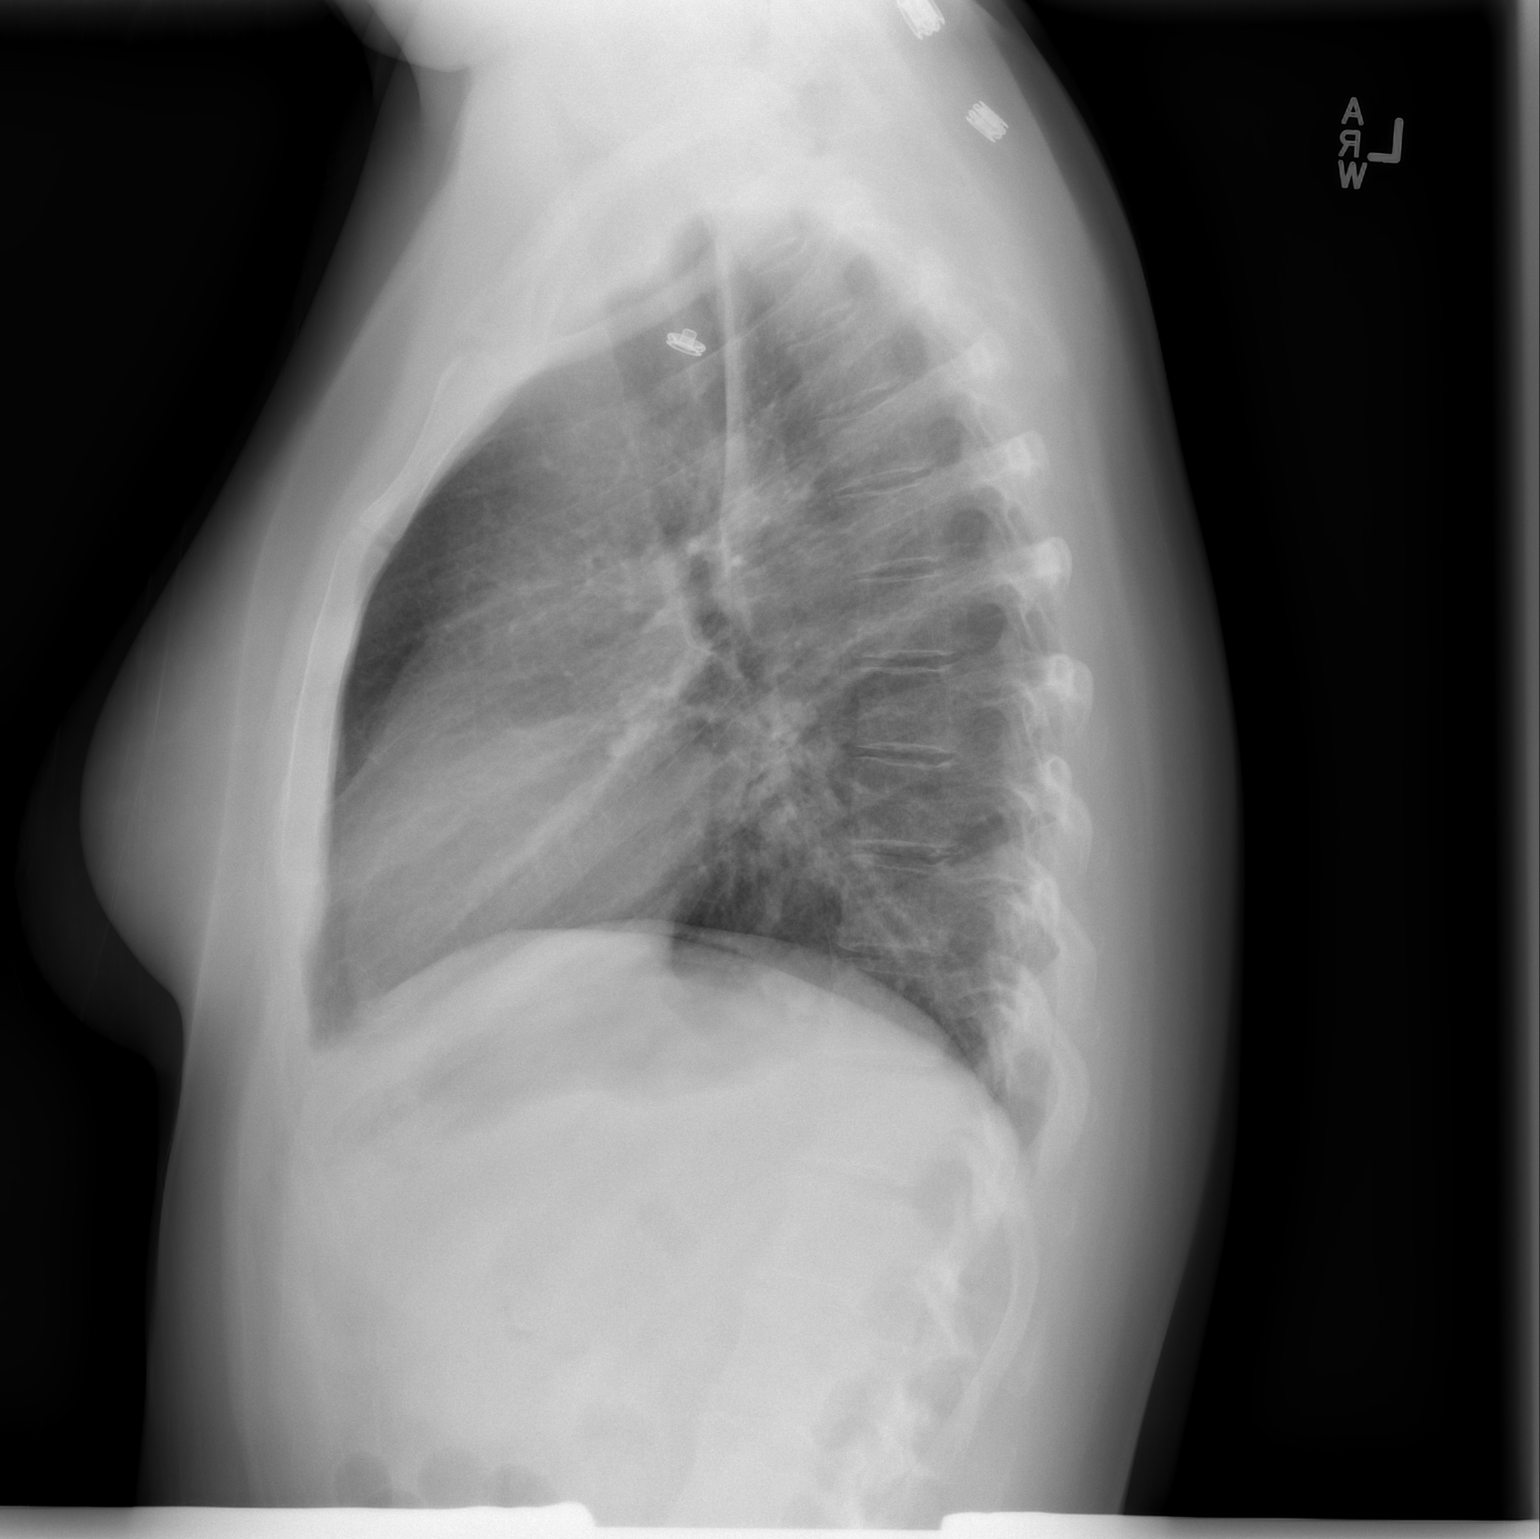

[2 of 2 positions shown; findings below may reference images not displayed]

FINDINGS: Accentuated interstitial and peribronchial markings.
This may be a result of reported smoking history.  No definite
acute pulmonary process.  Normal cardiomediastinal silhouette.
Bony thorax intact.
IMPRESSION: Chronic interstitial lung changes.  No acute abnormality.

## 2012-05-04 IMAGING — CR DG FOOT COMPLETE 3+V*L*
3 series · 3 of 3 positions shown · non-contrast
Comparison: None.

CLINICAL DATA: Broken toes

LEFT FOOT - COMPLETE 3+ VIEW

[t foot ap left]
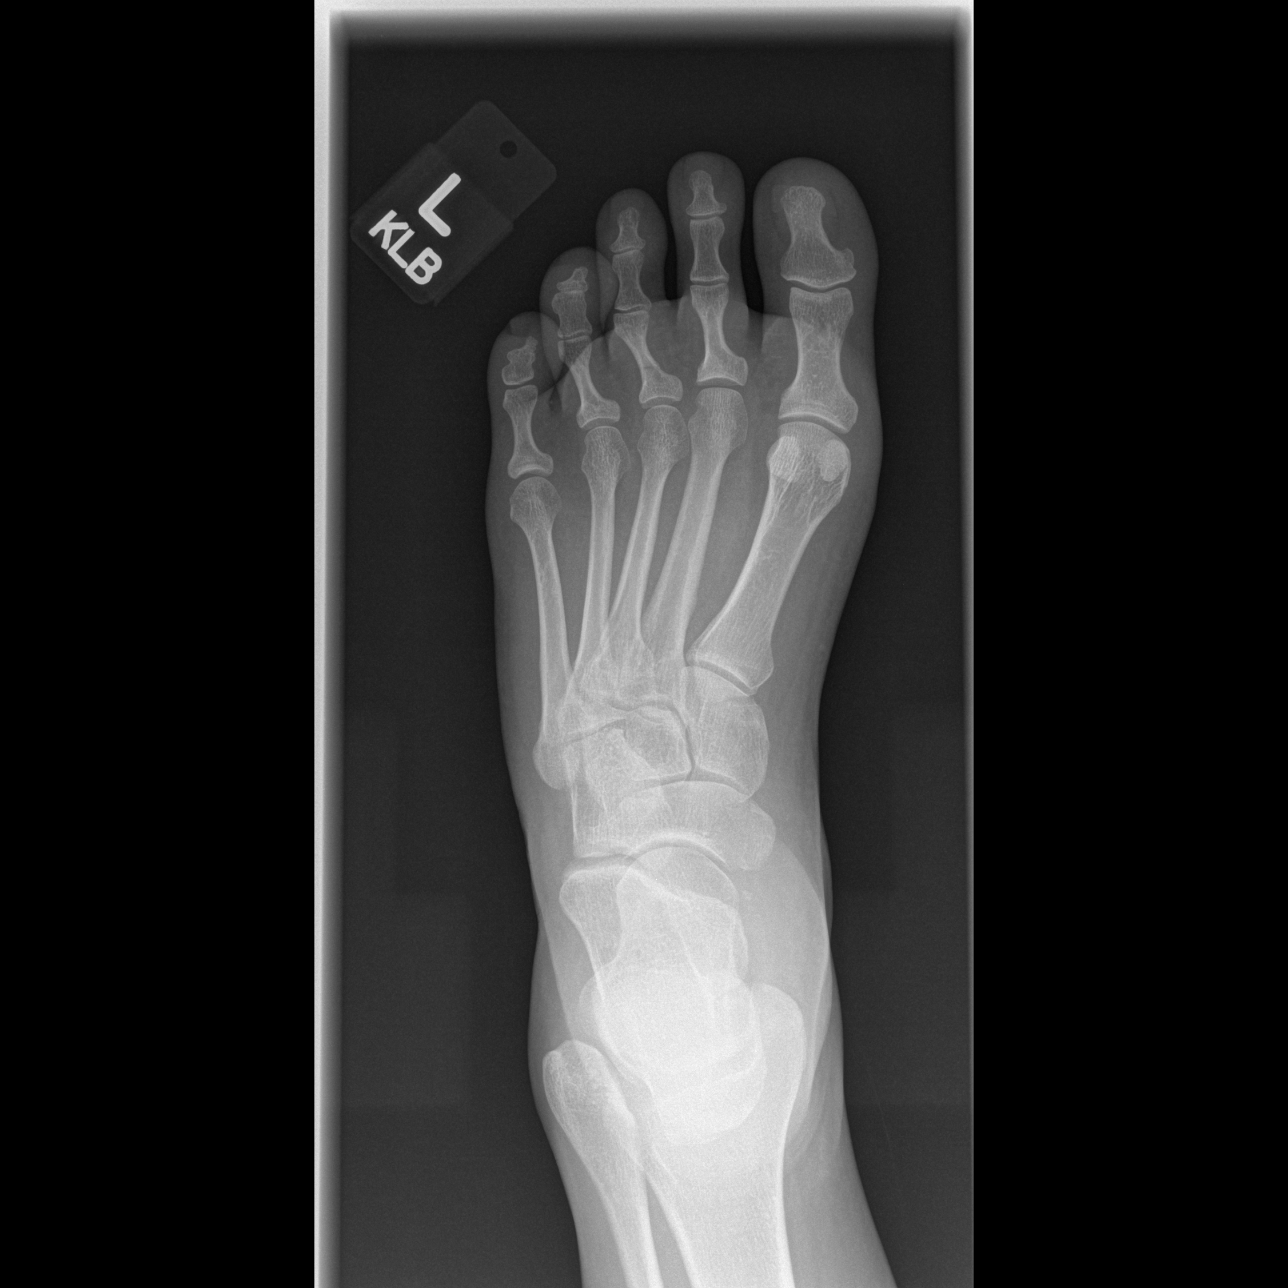

[t foot oblique left]
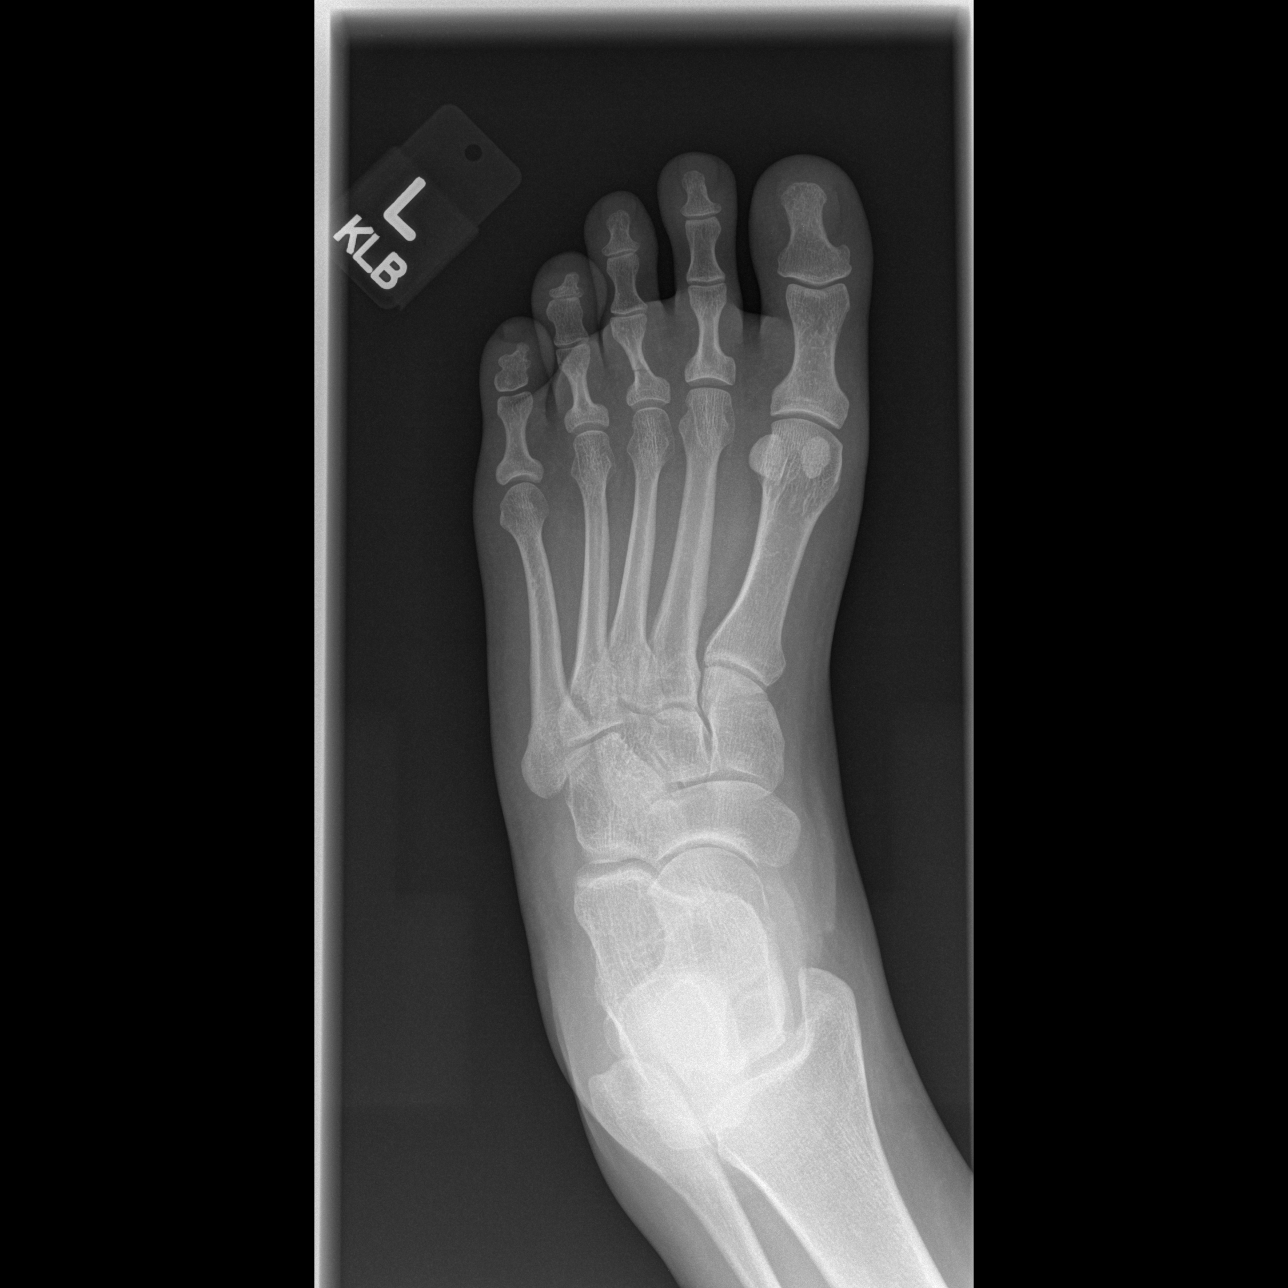

[t foot lat left]
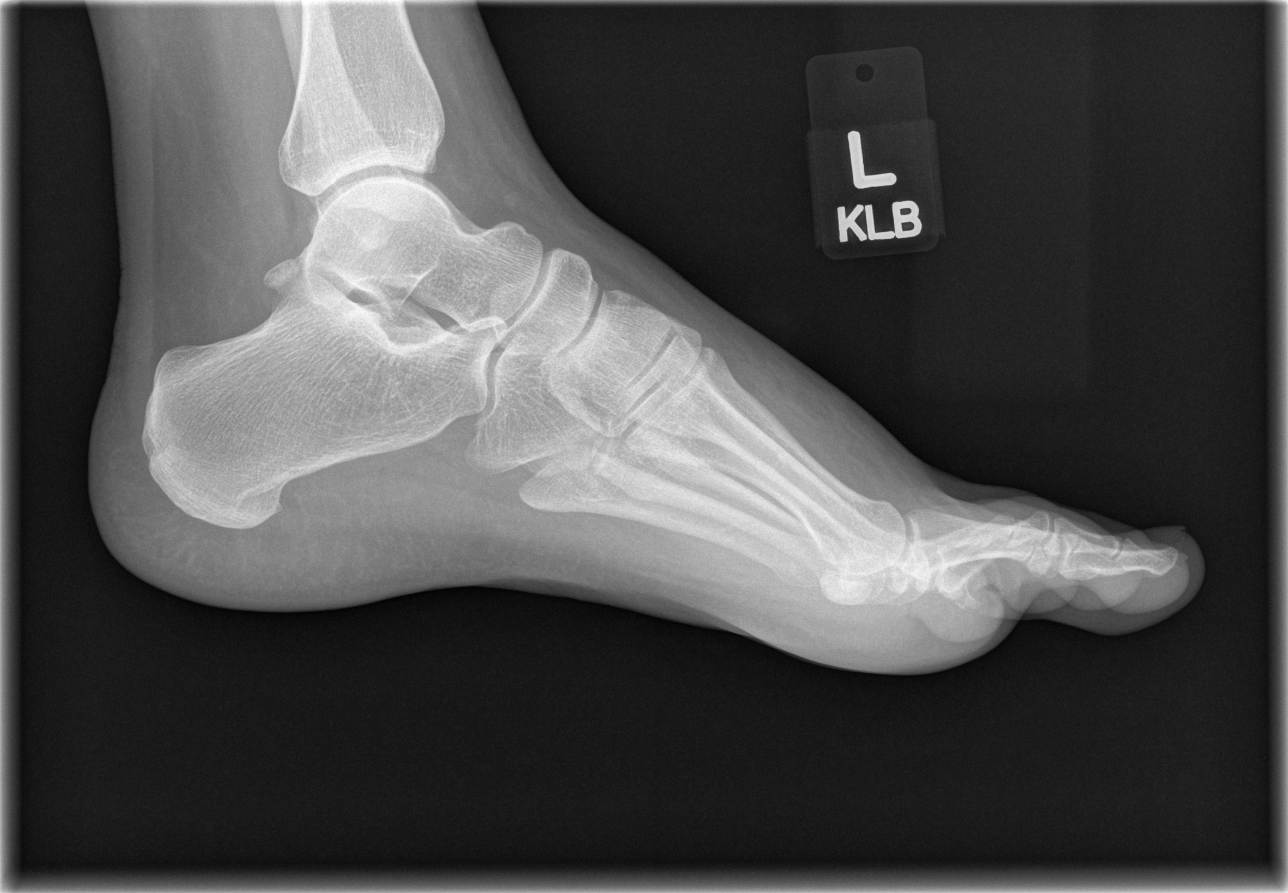

[3 of 3 positions shown; findings below may reference images not displayed]

FINDINGS: Midshaft transverse nondisplaced fracture of the proximal
phalanx of the third toe is present.  No dislocations.  Soft tissue
swelling over the forefoot is noted.
IMPRESSION: Nondisplaced fracture involving the proximal phalanx of the third
toe.

## 2012-05-09 IMAGING — CR DG FOOT COMPLETE 3+V*R*
3 series · 3 of 3 positions shown · non-contrast
Comparison: None.

CLINICAL DATA: Fell - right foot and ankle pain and swelling

RIGHT FOOT COMPLETE - 3+ VIEW

[t foot lat right]
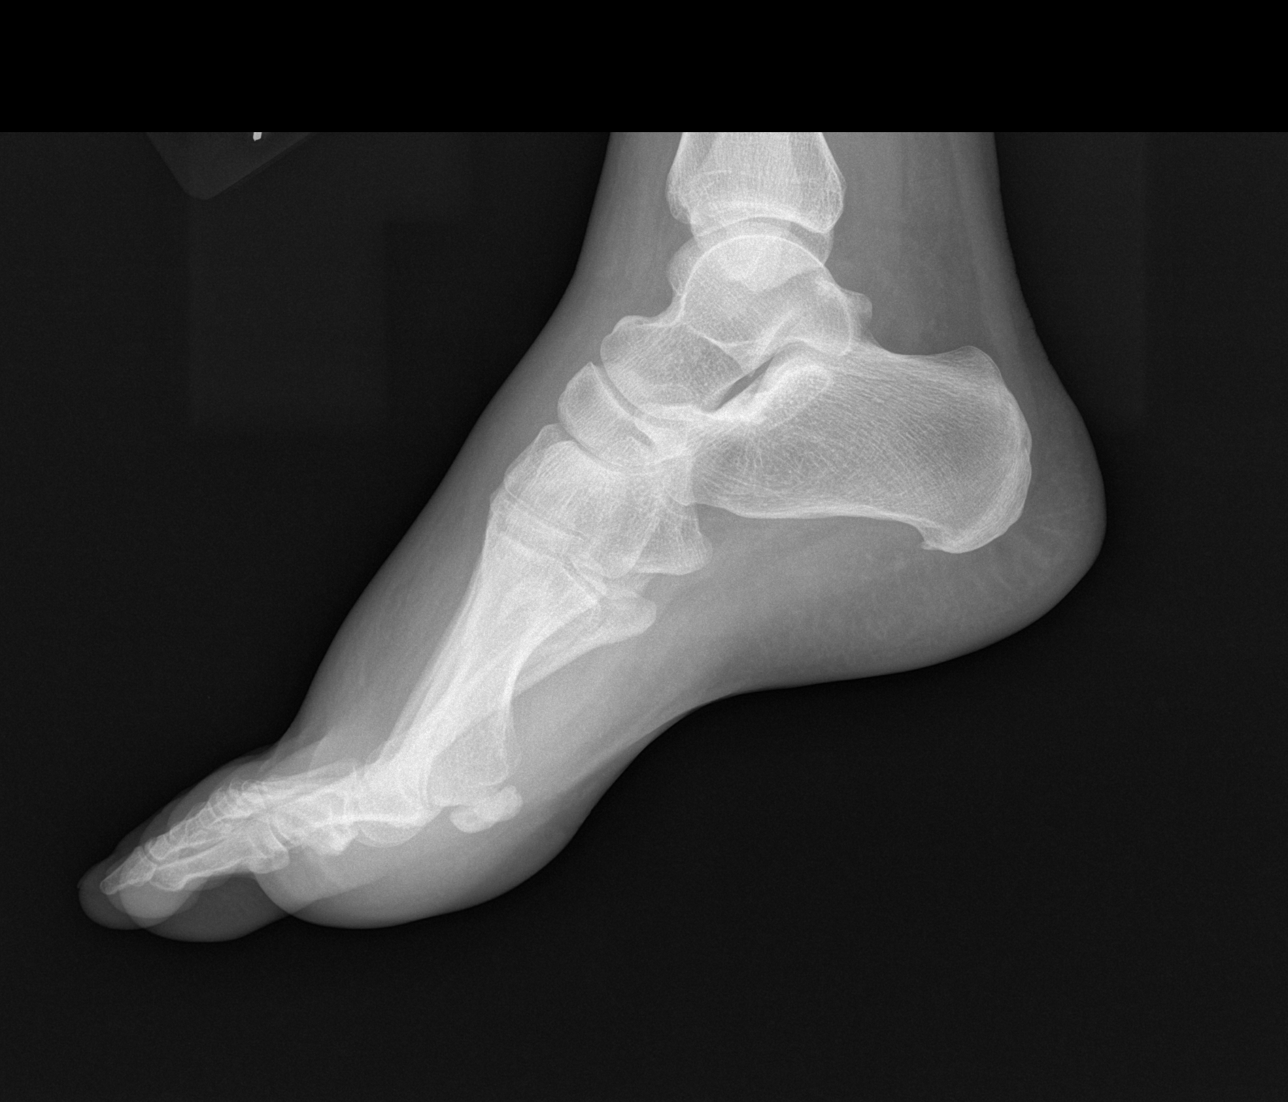

[t foot ap right]
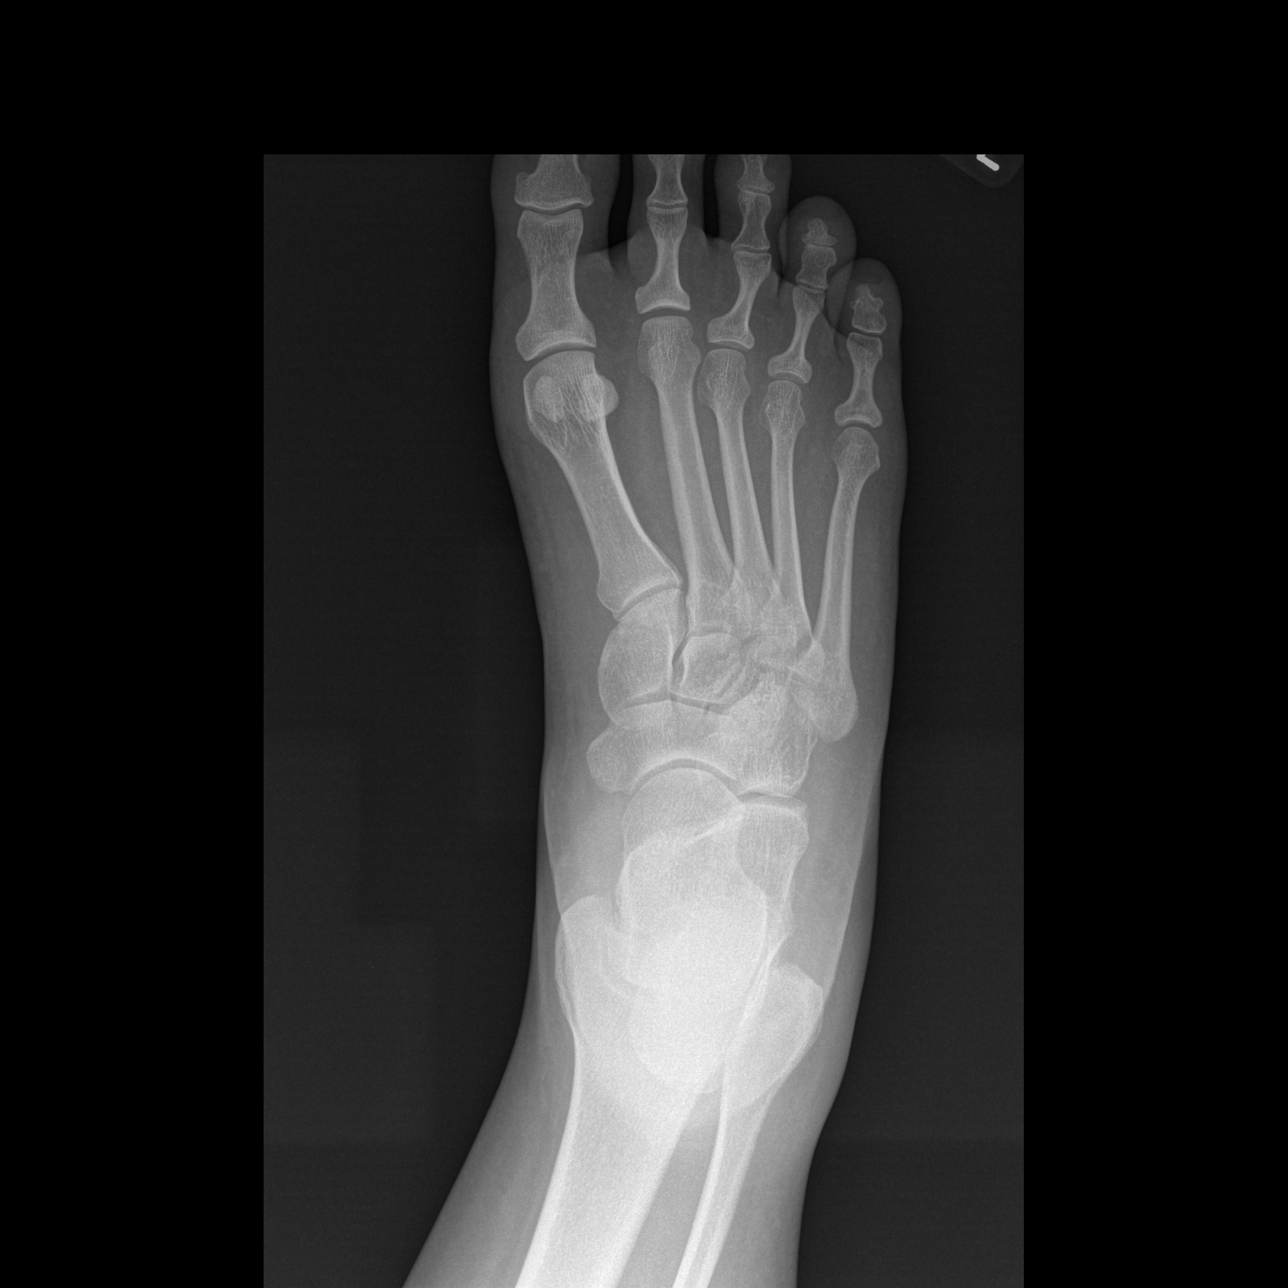

[t foot oblique right]
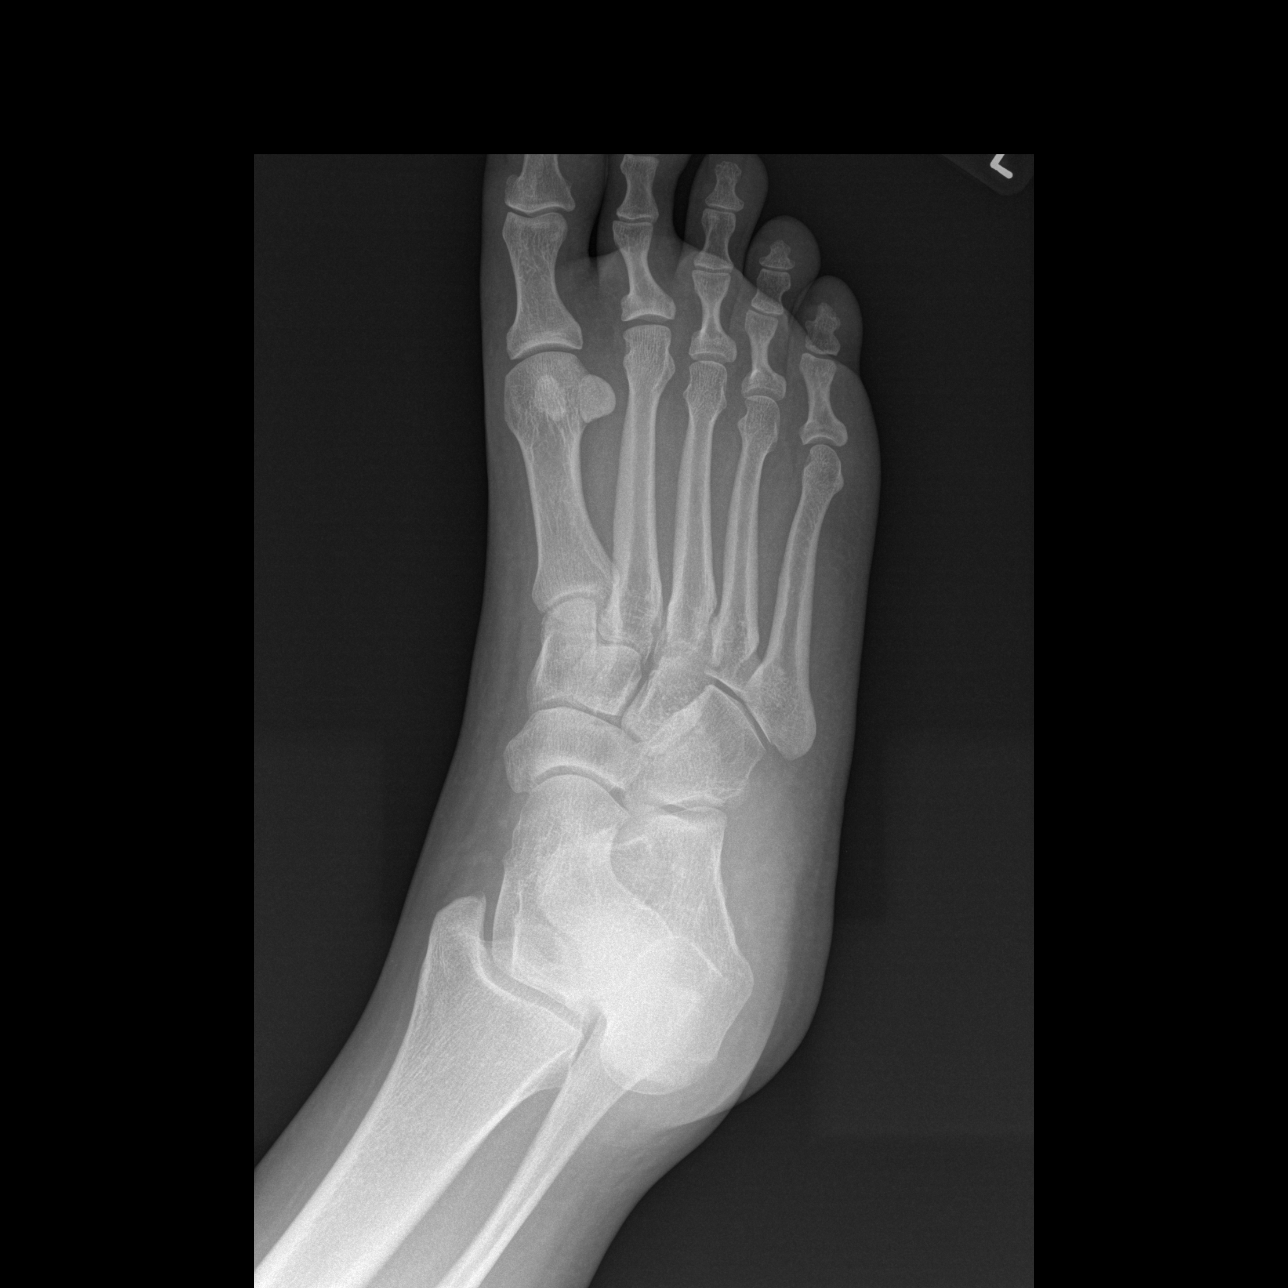

[3 of 3 positions shown; findings below may reference images not displayed]

FINDINGS: No fracture or dislocation.  No foreign body or other
abnormality of the soft tissues.

There is a small calcaneal spur.
IMPRESSION: No acute findings.

## 2012-05-09 IMAGING — CR DG ANKLE COMPLETE 3+V*R*
3 series · 3 of 3 positions shown · non-contrast
Comparison: None.

CLINICAL DATA: Ankle injury.  Ankle pain and swelling.

RIGHT ANKLE - COMPLETE 3+ VIEW

[t ankle joint ap right]
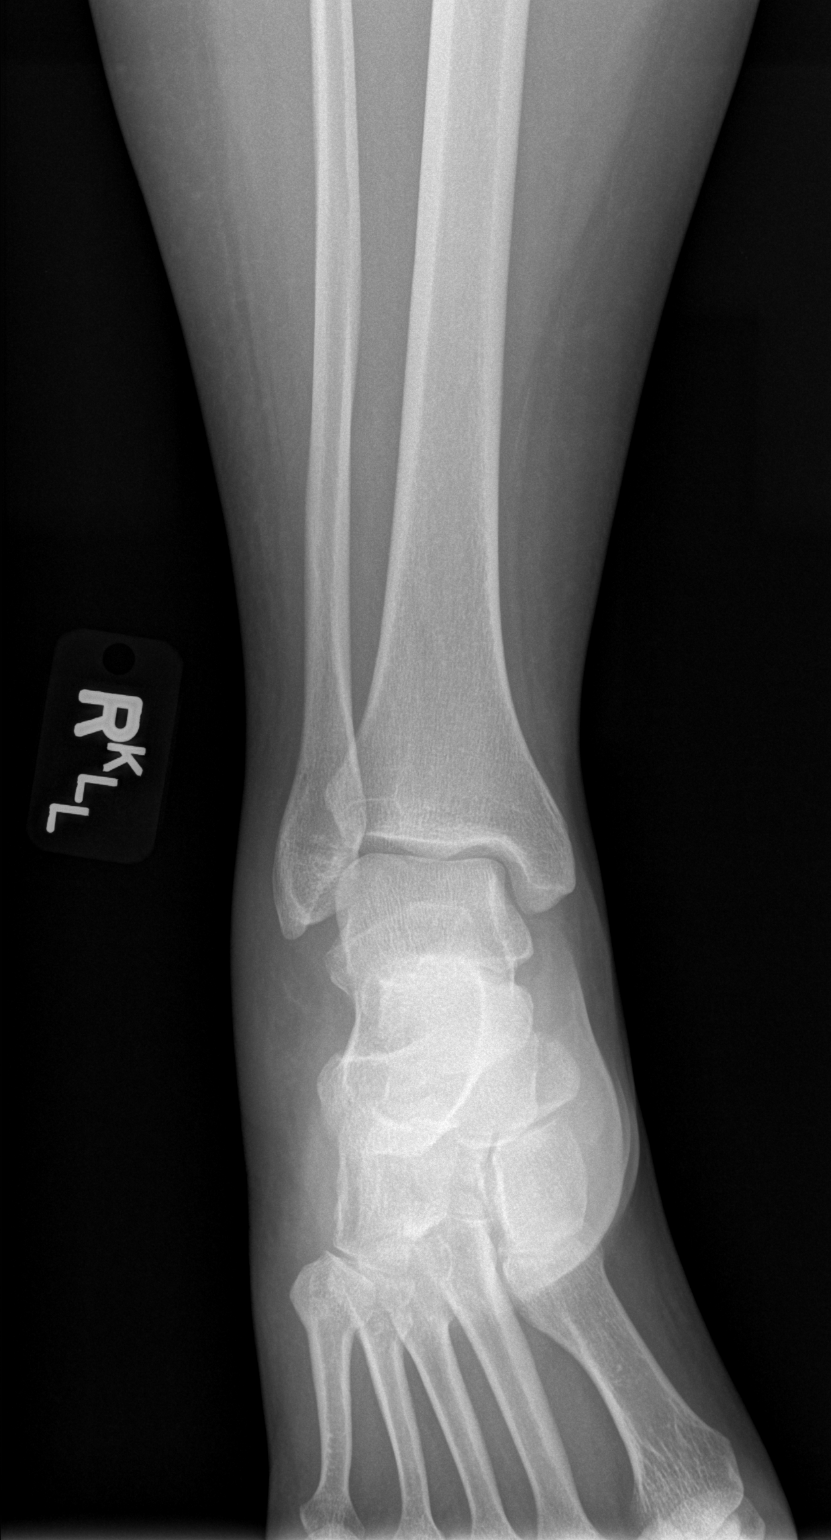

[t ankle joint oblique right]
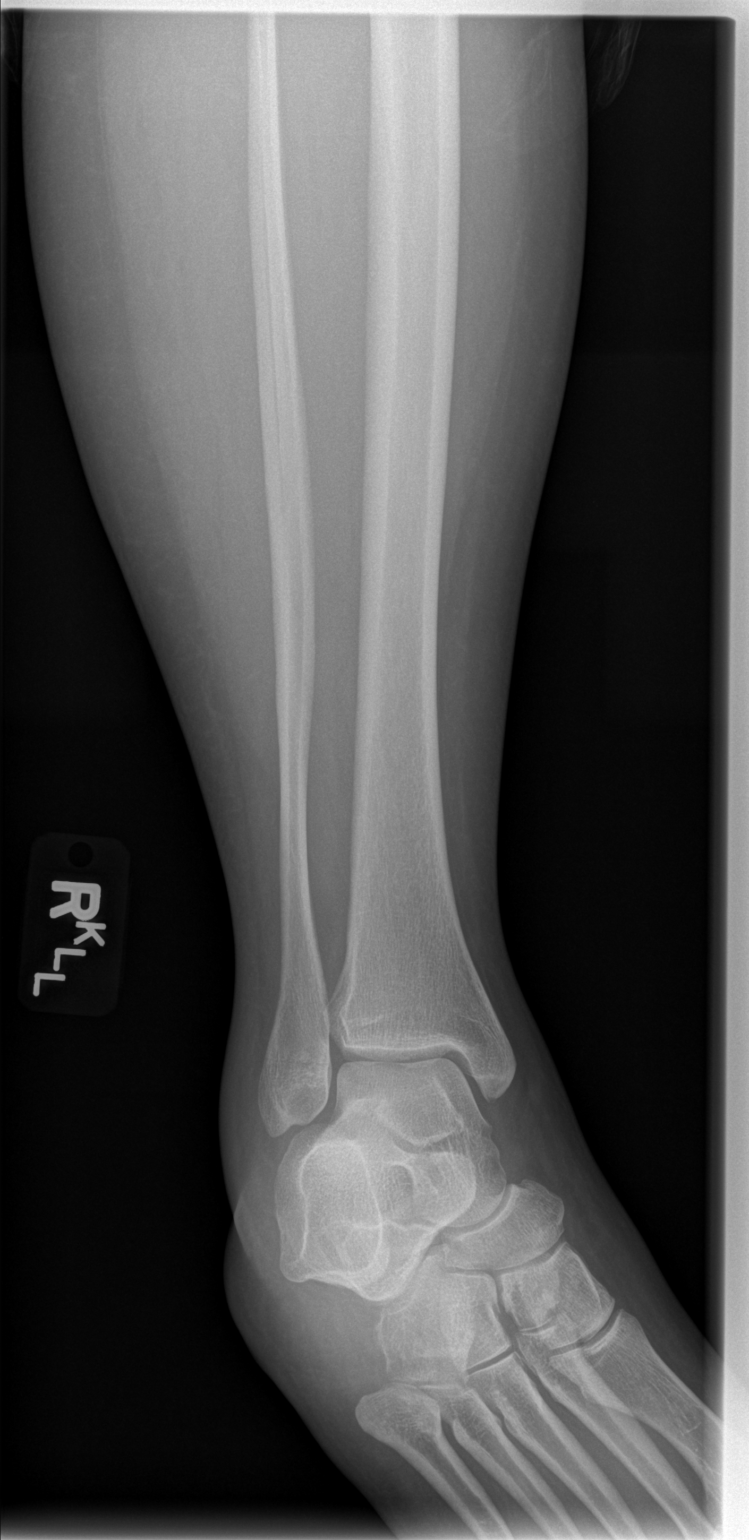

[t ankle joint lat right]
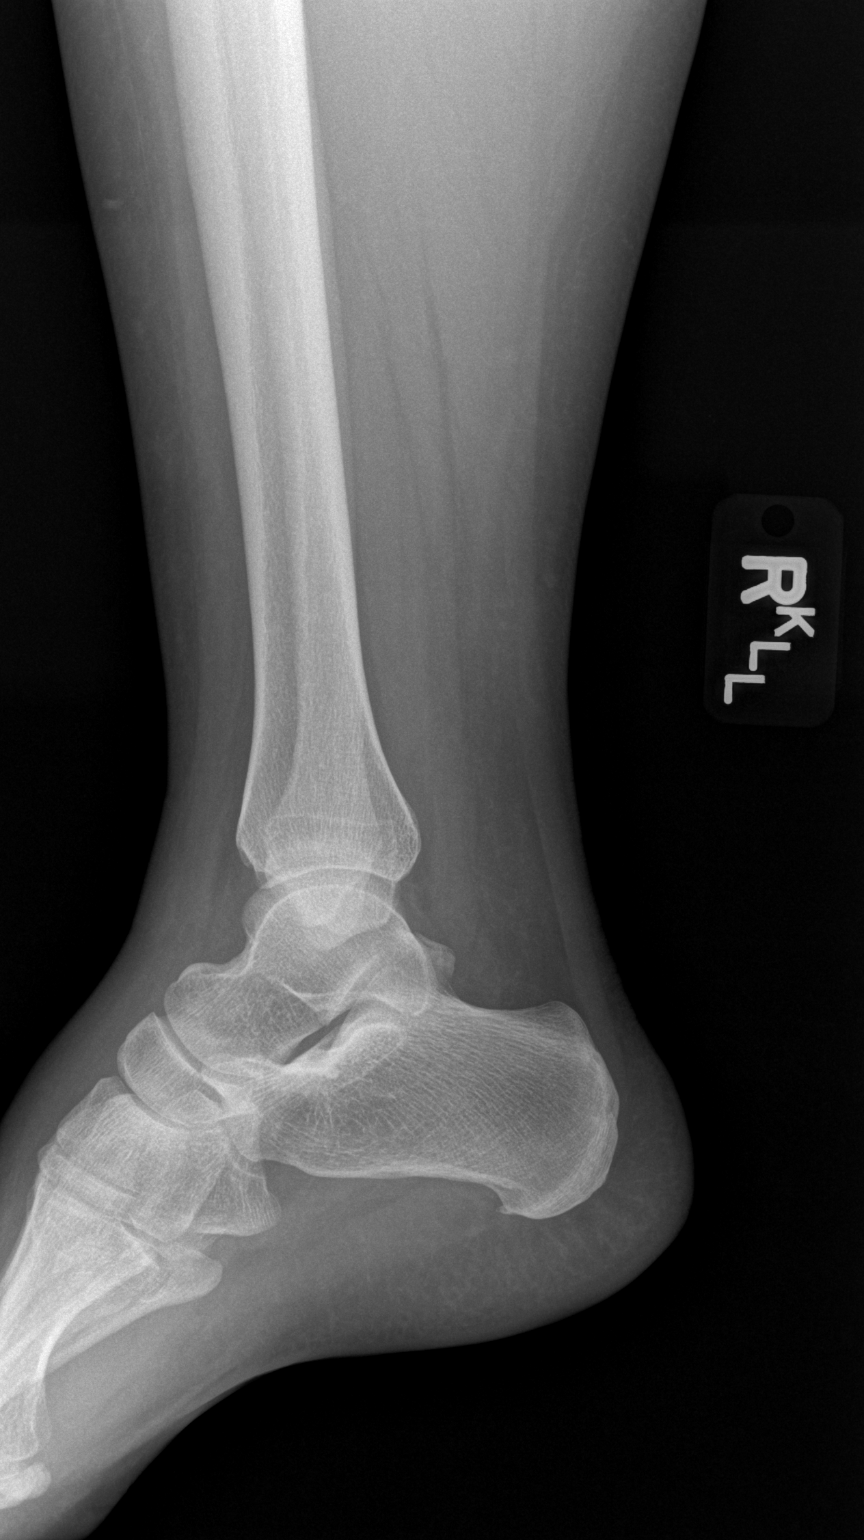

[3 of 3 positions shown; findings below may reference images not displayed]

FINDINGS: No evidence of fracture or dislocation.  Mild soft tissue
swelling is noted laterally.  Tiny plantar calcaneal spur noted.
IMPRESSION: Lateral soft tissue swelling.  No evidence of fracture.

## 2012-06-07 ENCOUNTER — Encounter: Payer: Self-pay | Admitting: Family Medicine

## 2012-06-07 ENCOUNTER — Ambulatory Visit (INDEPENDENT_AMBULATORY_CARE_PROVIDER_SITE_OTHER): Payer: Medicaid Other | Admitting: Family Medicine

## 2012-06-07 VITALS — BP 129/80 | HR 80 | Ht 66.0 in | Wt 156.8 lb

## 2012-06-07 DIAGNOSIS — F319 Bipolar disorder, unspecified: Secondary | ICD-10-CM

## 2012-06-07 DIAGNOSIS — B1920 Unspecified viral hepatitis C without hepatic coma: Secondary | ICD-10-CM

## 2012-06-07 DIAGNOSIS — R110 Nausea: Secondary | ICD-10-CM

## 2012-06-07 MED ORDER — PROMETHAZINE HCL 12.5 MG PO TABS: 12.5000 mg | ORAL_TABLET | Freq: Three times a day (TID) | ORAL | Status: DC | PRN

## 2012-06-07 NOTE — Patient Instructions (Signed)
It was great to see you again today. I'm sorry things have not been going well.  I have sent in a prescription for phenergan to your pharmacy.   Tomorrow morning please go to the [facility] agency to be seen. If you have any thoughts of hurting yourself or anyone else, go to the Emergency Room to stay safe.  Schedule an appointment to come back and see me in a few weeks so we can see how you are doing. You can also call our clinic with any questions or concerns.  Be well, Dr. Pollie Meyer

## 2012-06-07 NOTE — Progress Notes (Signed)
Name: [name] Age/Sex: 37 y.o. female  HPI:  Pt is accompanied by her fiance today.  Hepatitis C: pt went to see Dr. Brooke Dare at [facility] for her diagnosis of Hepatitis C. I have not received any records from this visit. Pt reports that it may be a "a year" before she is able to get medications for the hepatitis C, because it has to be approved through her Medicaid first. She has continued to have frequent fatigue, nausea, and headaches. Some days go well, and then other days she struggles to get out of bed. Requests something for nausea and says that Dr. Jacqualine Mau told her she could take antiemetics other than zofran. Says her nausea is overall improved but still present. She has learned what foods bother her stomach and has worked to avoid them.   Mood/anxiety: Pt has a hx of bipolar disorder but is not currently on any medications. She says that her mood and anxiety have been bad lately. She cries a lot for no reason. She does not sleep well. Experiences racing thoughts. At our last visit in late December 2013, I recommended that she be seen as soon as possible at mental health. She did not ever go to be seen. She says the diagnosis of Hep C and not knowing when she will be able to get medications is greatly affecting her. She denies having thoughts of hurting herself or others.  ROS: See HPI  PHYSICAL EXAM: BP 129/80  Pulse 80  Ht 5\' 6"  (1.676 m)  Wt 156 lb 12.8 oz (71.124 kg)  BMI 25.32 kg/m2 Gen:  NAD Heart: RRR Lungs: CTAB, NWOB Abd: soft, nontender, nondistended Neuro: grossly nonfocal, speech intact Psych: tearful, upset throughout interview. Affect is mood congruent. Denies SI/HI. Thought process is logical and linear. Does not appear to be responding to internal stimuli.

## 2012-06-08 LAB — COMPREHENSIVE METABOLIC PANEL
Albumin: 4.3 g/dL (ref 3.5–5.2)
Alkaline Phosphatase: 73 U/L (ref 39–117)
BUN: 18 mg/dL (ref 6–23)
CO2: 25 mEq/L (ref 19–32)
Calcium: 9.3 mg/dL (ref 8.4–10.5)
Glucose, Bld: 87 mg/dL (ref 70–99)
Potassium: 4.2 mEq/L (ref 3.5–5.3)
Sodium: 138 mEq/L (ref 135–145)
Total Protein: 7.2 g/dL (ref 6.0–8.3)

## 2012-06-08 LAB — CBC
Hemoglobin: 14 g/dL (ref 12.0–15.0)
Platelets: 291 10*3/uL (ref 150–400)
RBC: 4.51 MIL/uL (ref 3.87–5.11)
WBC: 6.2 10*3/uL (ref 4.0–10.5)

## 2012-06-10 NOTE — Assessment & Plan Note (Addendum)
No indications for acute hospitalization (denies SI/HI) but pt and I both agree that she needs to see a mental health provider as soon as possible. I think many of her physical symptoms are significantly worsened by untreated underlying mental illness.  She has a history of bipolar disorder requiring prior hospitalizations and has taken multiple psychiatric medications in the past. I feel that this pt's mental health condition is out of the scope of primary care and that she would be best served by seeing a psychiatrist. Pt and her fiance agree to go to mental health tomorrow morning to be seen and evaluated. Discussed safety precautions, per AVS.  I have precepted this pt with both clinical psychologist Dr. Pascal Lux and attending physician Dr. Leveda Anna.

## 2012-06-10 NOTE — Assessment & Plan Note (Addendum)
Check CMET, CBC to monitor for improvement of LFTs, also CBC since pt has so much fatigue. Have asked pt to return in 2 weeks so we can see how she is doing, both with establishing mental health care and with the rest of her physical symptoms.

## 2012-06-10 NOTE — Assessment & Plan Note (Addendum)
Reports overall improvement in nausea but requests medicine for nausea. Will rx a limited supply of phenergan 12.5mg . This is not ideal given diagnosis of Hep C, but will do this in a limited quantity. Precepted with Dr. Leveda Anna who agrees with this plan.

## 2012-06-11 ENCOUNTER — Encounter: Payer: Self-pay | Admitting: Family Medicine

## 2012-06-15 ENCOUNTER — Ambulatory Visit: Payer: Medicaid Other | Admitting: Family Medicine

## 2012-06-18 ENCOUNTER — Ambulatory Visit (INDEPENDENT_AMBULATORY_CARE_PROVIDER_SITE_OTHER): Payer: Medicaid Other | Admitting: Family Medicine

## 2012-06-18 ENCOUNTER — Encounter: Payer: Self-pay | Admitting: Family Medicine

## 2012-06-18 VITALS — BP 133/90 | HR 94 | Ht 66.0 in | Wt 161.2 lb

## 2012-06-18 DIAGNOSIS — M25512 Pain in left shoulder: Secondary | ICD-10-CM

## 2012-06-18 DIAGNOSIS — M25519 Pain in unspecified shoulder: Secondary | ICD-10-CM

## 2012-06-18 NOTE — Patient Instructions (Addendum)
Thank you for coming in today, it was good to see you Sorry to hear about your shoulder Keep your appointment with your orthopedic doctor. Follow up with [place] as needed.

## 2012-06-18 NOTE — Assessment & Plan Note (Signed)
S/p fall recently.  Evaluated in [facility] ED with concern for possible muscle tear.  She does have a lot of spasm on exam today and overall her strength is pretty good.  My concern would be  Pectoralis muscle tear given tenderness and difficulty abducting and reaching across body.  Instructed to keep current appt with Dr. Terrilee Croak in [place] and referral placed.

## 2012-06-18 NOTE — Progress Notes (Signed)
  Subjective:    Patient ID: [name], female    DOB: 02/14/1976, 37 y.o.   MRN: [id number]  HPI 1. Shoulder injury:  Patient here with complaint of shoulder injury that she sustained after falling on her patio last week.  She was evaluated at [facility] ED and given appointment to follow up with ortho over concern for rotator cuff tear.  She has an appointment with Dr. Terrilee Croak in orthopedics but needs [place] to place a referral since she has medicaid.  She has been using a sling to keep the shoulder immobilized.  Without the sling she can raise arm painfully but has difficulty reaching across, behind her and extending arm straight out in front of her.  She has hydrocodone for pain prescribed by the ED.    Review of Systems Per HPI    Objective:   Physical Exam  Constitutional:  Pleasant female, nad.  L arm in sling   HENT:  Mouth/Throat: Oropharynx is clear and moist.  Some bruising under eyes.   Musculoskeletal:  L arm removed from sling and she can painfully bring arm above head if abducting.  Unable to do so if extending.  She has difficulty reaching across and reaching behind her.  She does have tenderness in the upper pectoralis muscle, trapezius muscle and along the [facility] joint itself.            Assessment & Plan:

## 2012-07-08 ENCOUNTER — Emergency Department (HOSPITAL_COMMUNITY)
Admission: EM | Admit: 2012-07-08 | Discharge: 2012-07-08 | Disposition: A | Discharge status: Home / Self Care | Payer: MEDICAID | Attending: Emergency Medicine | Admitting: Emergency Medicine

## 2012-07-08 ENCOUNTER — Encounter (HOSPITAL_COMMUNITY): Payer: Self-pay

## 2012-07-08 DIAGNOSIS — F17200 Nicotine dependence, unspecified, uncomplicated: Secondary | ICD-10-CM | POA: Insufficient documentation

## 2012-07-08 DIAGNOSIS — Z87440 Personal history of urinary (tract) infections: Secondary | ICD-10-CM | POA: Insufficient documentation

## 2012-07-08 DIAGNOSIS — J45909 Unspecified asthma, uncomplicated: Secondary | ICD-10-CM | POA: Insufficient documentation

## 2012-07-08 DIAGNOSIS — Z79899 Other long term (current) drug therapy: Secondary | ICD-10-CM | POA: Insufficient documentation

## 2012-07-08 DIAGNOSIS — Z8659 Personal history of other mental and behavioral disorders: Secondary | ICD-10-CM | POA: Insufficient documentation

## 2012-07-08 DIAGNOSIS — J4489 Other specified chronic obstructive pulmonary disease: Secondary | ICD-10-CM | POA: Insufficient documentation

## 2012-07-08 DIAGNOSIS — F1010 Alcohol abuse, uncomplicated: Secondary | ICD-10-CM | POA: Insufficient documentation

## 2012-07-08 DIAGNOSIS — Z8739 Personal history of other diseases of the musculoskeletal system and connective tissue: Secondary | ICD-10-CM | POA: Insufficient documentation

## 2012-07-08 DIAGNOSIS — J449 Chronic obstructive pulmonary disease, unspecified: Secondary | ICD-10-CM | POA: Insufficient documentation

## 2012-07-08 DIAGNOSIS — Z87442 Personal history of urinary calculi: Secondary | ICD-10-CM | POA: Insufficient documentation

## 2012-07-08 DIAGNOSIS — F10929 Alcohol use, unspecified with intoxication, unspecified: Secondary | ICD-10-CM

## 2012-07-08 HISTORY — DX: Unspecified viral hepatitis C without hepatic coma: B19.20

## 2012-07-08 LAB — COMPREHENSIVE METABOLIC PANEL
CO2: 27 mEq/L (ref 19–32)
Calcium: 9.3 mg/dL (ref 8.4–10.5)
Creatinine, Ser: 0.67 mg/dL (ref 0.50–1.10)
GFR calc Af Amer: >90 mL/min (ref >90)
GFR calc non Af Amer: >90 mL/min (ref >90)
Glucose, Bld: 88 mg/dL (ref 70–99)
Total Protein: 8.2 g/dL (ref 6.0–8.3)

## 2012-07-08 LAB — CBC WITH DIFFERENTIAL/PLATELET
Basophils Absolute: 0 10*3/uL (ref 0.0–0.1)
Eosinophils Absolute: 0.1 10*3/uL (ref 0.0–0.7)
Eosinophils Relative: 3 % (ref 0–5)
HCT: 43.5 % (ref 36.0–46.0)
Lymphocytes Relative: 46 % (ref 12–46)
Lymphs Abs: 2.3 10*3/uL (ref 0.7–4.0)
MCH: 32.1 pg (ref 26.0–34.0)
MCV: 92.4 fL (ref 78.0–100.0)
Monocytes Absolute: 0.4 10*3/uL (ref 0.1–1.0)
RDW: 12.9 % (ref 11.5–15.5)
WBC: 5.1 10*3/uL (ref 4.0–10.5)

## 2012-07-08 LAB — RAPID URINE DRUG SCREEN, HOSP PERFORMED
Cocaine: NOT DETECTED
Opiates: NOT DETECTED

## 2012-07-08 LAB — URINALYSIS, ROUTINE W REFLEX MICROSCOPIC
Glucose, UA: NEGATIVE mg/dL
Leukocytes, UA: NEGATIVE
Protein, ur: NEGATIVE mg/dL
Specific Gravity, Urine: 1.015 (ref 1.005–1.030)
pH: 5 (ref 5.0–8.0)

## 2012-07-08 NOTE — ED Notes (Signed)
Pt demanding to use phone, unable to dial numbers w/o assistance. [name] was called and pt asked him to come and get her. Pt handed me the phone and gave me permission to talk to him. He states he will come to pick her up.
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
## 2012-07-08 NOTE — ED Notes (Signed)
Eyes 4+ injected, speech slurred and rambling. Movements exaggerated. Odor of ETOH, pt admits to drinking tonight but denies using any drugs.

## 2012-07-08 NOTE — ED Notes (Signed)
Pt has gotten dressed, started pulling out IV. Now drinking sprite and attempting to make another phone call.
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
## 2012-08-26 ENCOUNTER — Emergency Department (HOSPITAL_COMMUNITY)
Admission: EM | Admit: 2012-08-26 | Discharge: 2012-08-26 | Disposition: A | Discharge status: Home / Self Care | Payer: Medicaid Other | Attending: Emergency Medicine | Admitting: Emergency Medicine

## 2012-08-26 ENCOUNTER — Emergency Department (HOSPITAL_COMMUNITY): Payer: Medicaid Other

## 2012-08-26 ENCOUNTER — Encounter (HOSPITAL_COMMUNITY): Payer: Self-pay | Admitting: Emergency Medicine

## 2012-08-26 DIAGNOSIS — J45909 Unspecified asthma, uncomplicated: Secondary | ICD-10-CM | POA: Insufficient documentation

## 2012-08-26 DIAGNOSIS — W010XXA Fall on same level from slipping, tripping and stumbling without subsequent striking against object, initial encounter: Secondary | ICD-10-CM | POA: Insufficient documentation

## 2012-08-26 DIAGNOSIS — Z87442 Personal history of urinary calculi: Secondary | ICD-10-CM | POA: Insufficient documentation

## 2012-08-26 DIAGNOSIS — Z87440 Personal history of urinary (tract) infections: Secondary | ICD-10-CM | POA: Insufficient documentation

## 2012-08-26 DIAGNOSIS — Z8619 Personal history of other infectious and parasitic diseases: Secondary | ICD-10-CM | POA: Insufficient documentation

## 2012-08-26 DIAGNOSIS — F17200 Nicotine dependence, unspecified, uncomplicated: Secondary | ICD-10-CM | POA: Insufficient documentation

## 2012-08-26 DIAGNOSIS — Z87448 Personal history of other diseases of urinary system: Secondary | ICD-10-CM | POA: Insufficient documentation

## 2012-08-26 DIAGNOSIS — Z8659 Personal history of other mental and behavioral disorders: Secondary | ICD-10-CM | POA: Insufficient documentation

## 2012-08-26 DIAGNOSIS — Z79899 Other long term (current) drug therapy: Secondary | ICD-10-CM | POA: Insufficient documentation

## 2012-08-26 DIAGNOSIS — S42002A Fracture of unspecified part of left clavicle, initial encounter for closed fracture: Secondary | ICD-10-CM

## 2012-08-26 DIAGNOSIS — J449 Chronic obstructive pulmonary disease, unspecified: Secondary | ICD-10-CM | POA: Insufficient documentation

## 2012-08-26 DIAGNOSIS — S42023A Displaced fracture of shaft of unspecified clavicle, initial encounter for closed fracture: Secondary | ICD-10-CM | POA: Insufficient documentation

## 2012-08-26 DIAGNOSIS — Z8739 Personal history of other diseases of the musculoskeletal system and connective tissue: Secondary | ICD-10-CM | POA: Insufficient documentation

## 2012-08-26 DIAGNOSIS — Y9289 Other specified places as the place of occurrence of the external cause: Secondary | ICD-10-CM | POA: Insufficient documentation

## 2012-08-26 DIAGNOSIS — Z8709 Personal history of other diseases of the respiratory system: Secondary | ICD-10-CM | POA: Insufficient documentation

## 2012-08-26 DIAGNOSIS — J4489 Other specified chronic obstructive pulmonary disease: Secondary | ICD-10-CM | POA: Insufficient documentation

## 2012-08-26 DIAGNOSIS — Y9301 Activity, walking, marching and hiking: Secondary | ICD-10-CM | POA: Insufficient documentation

## 2012-08-26 MED ORDER — OXYCODONE-ACETAMINOPHEN 5-325 MG PO TABS
1.0000 | ORAL_TABLET | Freq: Once | ORAL | Status: AC
  Administered 2012-08-26: 1 via ORAL
  Filled 2012-08-26: qty 1

## 2012-08-26 MED ORDER — OXYCODONE-ACETAMINOPHEN 5-325 MG PO TABS: 1.0000 | ORAL_TABLET | ORAL | Status: DC | PRN

## 2012-08-26 MED ORDER — NAPROXEN 500 MG PO TABS: 500.0000 mg | ORAL_TABLET | Freq: Two times a day (BID) | ORAL | Status: DC

## 2012-08-26 MED ORDER — ONDANSETRON 4 MG PO TBDP
4.0000 mg | ORAL_TABLET | Freq: Once | ORAL | Status: AC
  Administered 2012-08-26: 4 mg via ORAL
  Filled 2012-08-26: qty 1

## 2012-08-26 NOTE — ED Provider Notes (Signed)
History    This chart was scribed for non-physician practitioner, Lottie Mussel, PA-C, working with Geoffery Lyons, MD by Melba Coon, ED Scribe. This patient was seen in room TR11C/TR11C and the patient's care was started at 4:08PM.   CSN: [id number]  Arrival date & time 08/26/12  1420   First MD Initiated Contact with Patient 08/26/12 1531      Chief Complaint  Patient presents with  . Shoulder Injury    (Consider location/radiation/quality/duration/timing/severity/associated sxs/prior treatment) Patient is a 37 y.o. female presenting with shoulder injury. The history is provided by the patient. No language interpreter was used.  Shoulder Injury This is a new problem. The current episode started 2 days ago. The problem occurs constantly. The problem has been gradually worsening. Pertinent negatives include no chest pain, no abdominal pain and no headaches.   HPI Comments: [name] is a 37 y.o. female who presents to the Emergency Department complaining of constant, moderate to severe left shoulder pain with a sudden onset 2 days ago. Pt reports that she slipped and fell on her shoulder on a rock while she [place]. Pt states pain with any ROM of the left shoulder. She reports that the swelling has decreased since onset but the pain is still present and worsening. She reports that there is some deformity to her shoulder and is concerned about a fracture. She reports no SOB. No other pertinent medical symptoms. No numbness or weakness in hand. No chest pain.  Past Medical History  Diagnosis Date  . Asthma   . Emphysema   . COPD (chronic obstructive pulmonary disease)   . Fibromyalgia, primary   . Anxiety   . Depressed   . Nephrolithiasis   . UTI (lower urinary tract infection)   . Kidney stone   . Hepatitis C     Past Surgical History  Procedure Laterality Date  . Abdominal hysterectomy    . Orif ankle fracture  02/03/2012    Procedure:  OPEN REDUCTION INTERNAL FIXATION (ORIF) ANKLE FRACTURE;  Surgeon: Nadara Mustard, MD;  Location: [facility] OR;  Service: Orthopedics;  Laterality: Right;  Open Reduction Internal Fixation Right Ankle    Family History  Problem Relation Age of Onset  . Alcohol abuse Father   . Cirrhosis Father   . Cancer Father   . Hyperthyroidism Sister   . Bipolar disorder Sister   . Kidney disease Brother   . Heart attack Mother   . Hypertension Mother     History  Substance Use Topics  . Smoking status: Current Every Day Smoker -- 0.50 packs/day    Types: Cigarettes  . Smokeless tobacco: Never Used  . Alcohol Use: Yes     Comment: occasionally    OB History   Grav Para Term Preterm Abortions TAB SAB Ect Mult Living                 Obstetric Comments   S/p hysterectomy      Review of Systems  Constitutional: Negative for appetite change and fatigue.  HENT: Negative for congestion, sinus pressure and ear discharge.   Eyes: Negative for discharge.  Respiratory: Negative for cough.   Cardiovascular: Negative for chest pain.  Gastrointestinal: Negative for abdominal pain and diarrhea.  Genitourinary: Negative for frequency and hematuria.  Musculoskeletal: Positive for joint swelling. Negative for back pain.  Skin: Negative for rash.  Neurological: Negative for seizures, weakness, numbness and headaches.  Psychiatric/Behavioral: Negative for hallucinations.  All other systems reviewed and are negative.    Allergies  Eggs or egg-derived products; Milk-related compounds; and Sulfa antibiotics  Home Medications   Current Outpatient Rx  Name  Route  Sig  Dispense  Refill  . albuterol (PROVENTIL HFA;VENTOLIN HFA) 108 (90 BASE) MCG/ACT inhaler   Inhalation   Inhale 2 puffs into the lungs every 4 (four) hours as needed for wheezing.   1 Inhaler   0   . CALCIUM PO   Oral   Take 1 tablet by mouth daily.         . fish oil-omega-3 fatty acids 1000 MG capsule   Oral   Take 1 g by mouth  2 (two) times daily.         Marland Kitchen ibuprofen (ADVIL,MOTRIN) 200 MG tablet   Oral   Take 800 mg by mouth every 8 (eight) hours as needed for pain.         . Multiple Vitamin (MULTIVITAMIN WITH MINERALS) TABS   Oral   Take 1 tablet by mouth daily.           BP 153/103  Pulse 95  Temp(Src) 98.2 F (36.8 C) (Oral)  Resp 100  SpO2 100%  Physical Exam  Nursing note and vitals reviewed. Constitutional: She is oriented to person, place, and time. She appears well-developed.  HENT:  Head: Normocephalic.  Eyes: Conjunctivae are normal.  Neck: No tracheal deviation present.  Cardiovascular: Normal rate.   No murmur heard. Pulmonary/Chest: Effort normal and breath sounds normal. No respiratory distress. She has no wheezes. She has no rales. She exhibits no tenderness.  Musculoskeletal: She exhibits edema and tenderness.  Extensive bruising over the left upper anterior chest over the left clavicle. Shoulder joint is normal with tenderness over the left clavicle. Did not test for ROM due to pain. Normal distal left radial pulse with good CR.  Neurological: She is oriented to person, place, and time.  Skin: Skin is warm.  Psychiatric: She has a normal mood and affect.    ED Course  Procedures (including critical care time)  COORDINATION OF CARE:  4:12PM - imaging results reviewed an shows a left clavicular fracture. Ortho and attending physician will be consulted.  She will be referred to orthopedic specialist.  4:18PM - Pt placed in Sling, pain medication, percocet given in ED.  She is ready for d/c.   Labs Reviewed - No data to display Dg Shoulder Left  08/26/2012   *RADIOLOGY REPORT*  Clinical Data: Trauma 2 days ago.  Laceration about clavicle.  LEFT SHOULDER - 2+ VIEW  Comparison: 03/05/2005  Findings: Mid clavicular shaft fracture with greater than two shaft widths of inferior displacement of the distal fracture fragment. Approximate 1.0 cm of override.  No fracture or  dislocation about the glenohumeral joint.  IMPRESSION: Displace mid right clavicular fracture.   Original Report Authenticated By: Jeronimo Greaves, M.D.     1. Clavicle fracture, left, closed, initial encounter       MDM  Pt with displaced left clavicle fracture. It is closed. neurovacularly intact. Lungs clear, doubt underlining soft tissue injury. Discussed with Dr. Judd Lien, splint, follow up with pt's orthopedics doctor.     Filed Vitals:   08/26/12 1425  BP: 153/103  Pulse: 95  Temp: 98.2 F (36.8 C)  TempSrc: Oral  Resp: 100  SpO2: 100%    I personally performed the services described in this documentation, which was scribed in my presence. The recorded information has been reviewed  and is accurate.       Lottie Mussel, PA-C 08/26/12 1639

## 2012-08-26 NOTE — ED Notes (Signed)
[place].  I iced it and now that the swelling is down some its in a lot of pain.  Some deformity noticed.

## 2012-08-26 NOTE — ED Notes (Signed)
Patient placed in room FT 11 after x-ray

## 2012-08-26 NOTE — Discharge Instructions (Signed)
You have a displaced fracture of your left clavicle. Keep sling on at all times. Continue to ice. Naprosyn and percocet for pain. Call Dr. Lajoyce Corners tomorrow for follow up.    Clavicle Fracture A clavicle fracture is a break in the collarbone. This is a common injury, especially in children. Collarbones do not harden until around the age of 52. Most collarbone fractures are treated with a simple arm sling. In some cases a figure-of-eight splint is used to help hold the broken bones in position. Although not often needed, surgery may be required if the bone fragments are not in the correct position (displaced).  HOME CARE INSTRUCTIONS   Apply ice to the injury for 15-20 minutes each hour while awake for 2 days. Put the ice in a plastic bag and place a towel between the bag of ice and your skin.  Wear the sling or splint constantly for as long as directed by your caregiver. You may remove the sling or splint for bathing or showering. Be sure to keep your shoulder in the same place as when the sling or splint is on. Do not lift your arm.  If a figure-of-eight splint is applied, it must be tightened by another person every day. Tighten it enough to keep the shoulders held back. Allow enough room to place the index finger between the body and strap. Loosen the splint immediately if you feel numbness or tingling in your hands.  Only take over-the-counter or prescription medicines for pain, discomfort, or fever as directed by your caregiver.  Avoid activities that irritate or increase the pain for 4 to 6 weeks after surgery.  Follow all instructions for follow-up with your caregiver. This includes any referrals, physical therapy, and rehabilitation. Any delay in obtaining necessary care could result in a delay or failure of the injury to heal properly. SEEK MEDICAL CARE IF:  You have pain and swelling that are not relieved with medications. SEEK IMMEDIATE MEDICAL CARE IF:  Your arm is numb, cold, or pale,  even when the splint is loose. MAKE SURE YOU:   Understand these instructions.  Will watch your condition.  Will get help right away if you are not doing well or get worse. Document Released: 12/22/2004 Document Revised: 06/06/2011 Document Reviewed: 10/18/2007 [facility] Patient Information 2014 [place], [place].

## 2012-08-27 NOTE — ED Provider Notes (Signed)
Medical screening examination/treatment/procedure(s) were performed by non-physician practitioner and as supervising physician I was immediately available for consultation/collaboration.  Geoffery Lyons, MD 08/27/12 [phone]

## 2012-08-28 ENCOUNTER — Encounter (HOSPITAL_COMMUNITY): Payer: Self-pay | Admitting: *Deleted

## 2012-08-28 ENCOUNTER — Other Ambulatory Visit (HOSPITAL_COMMUNITY): Payer: Self-pay | Admitting: Orthopaedic Surgery

## 2012-08-28 MED ORDER — MUPIROCIN 2 % EX OINT
TOPICAL_OINTMENT | Freq: Two times a day (BID) | CUTANEOUS | Status: DC
  Administered 2012-08-29 – 2012-08-30 (×3): via NASAL
  Filled 2012-08-28 (×2): qty 22

## 2012-08-28 MED ORDER — CEFAZOLIN SODIUM 1-5 GM-% IV SOLN
INTRAVENOUS | Status: AC
  Filled 2012-08-28: qty 100

## 2012-08-28 NOTE — Progress Notes (Signed)
I spoke with [name] at Dr Ophelia Charter' office, [name] said patient could eat until 1000.  I notified patient

## 2012-08-29 ENCOUNTER — Ambulatory Visit (HOSPITAL_COMMUNITY): Payer: Medicaid Other

## 2012-08-29 ENCOUNTER — Ambulatory Visit (HOSPITAL_COMMUNITY): Payer: Medicaid Other | Admitting: Certified Registered Nurse Anesthetist

## 2012-08-29 ENCOUNTER — Observation Stay (HOSPITAL_COMMUNITY)
Admission: RE | Admit: 2012-08-29 | Discharge: 2012-08-30 | Disposition: A | Discharge status: Home / Self Care | Payer: Medicaid Other | Source: Ambulatory Visit | Attending: Orthopaedic Surgery | Admitting: Orthopaedic Surgery

## 2012-08-29 ENCOUNTER — Encounter (HOSPITAL_COMMUNITY)
Admission: RE | Disposition: A | Discharge status: Home / Self Care | Payer: Self-pay | Source: Ambulatory Visit | Attending: Orthopaedic Surgery

## 2012-08-29 ENCOUNTER — Encounter (HOSPITAL_COMMUNITY): Payer: Self-pay | Admitting: Certified Registered Nurse Anesthetist

## 2012-08-29 ENCOUNTER — Encounter (HOSPITAL_COMMUNITY): Payer: Self-pay | Admitting: *Deleted

## 2012-08-29 DIAGNOSIS — S42002A Fracture of unspecified part of left clavicle, initial encounter for closed fracture: Secondary | ICD-10-CM

## 2012-08-29 DIAGNOSIS — J4489 Other specified chronic obstructive pulmonary disease: Secondary | ICD-10-CM | POA: Insufficient documentation

## 2012-08-29 DIAGNOSIS — W010XXA Fall on same level from slipping, tripping and stumbling without subsequent striking against object, initial encounter: Secondary | ICD-10-CM | POA: Insufficient documentation

## 2012-08-29 DIAGNOSIS — J449 Chronic obstructive pulmonary disease, unspecified: Secondary | ICD-10-CM | POA: Insufficient documentation

## 2012-08-29 DIAGNOSIS — Y9239 Other specified sports and athletic area as the place of occurrence of the external cause: Secondary | ICD-10-CM | POA: Insufficient documentation

## 2012-08-29 DIAGNOSIS — S42023A Displaced fracture of shaft of unspecified clavicle, initial encounter for closed fracture: Principal | ICD-10-CM | POA: Insufficient documentation

## 2012-08-29 HISTORY — DX: Cardiac arrhythmia, unspecified: I49.9

## 2012-08-29 HISTORY — DX: Fibromyalgia: M79.7

## 2012-08-29 HISTORY — PX: ORIF CLAVICULAR FRACTURE: SHX5055

## 2012-08-29 LAB — COMPREHENSIVE METABOLIC PANEL
ALT: 215 U/L — ABNORMAL HIGH (ref 0–35)
Albumin: 3.5 g/dL (ref 3.5–5.2)
Alkaline Phosphatase: 98 U/L (ref 39–117)
Potassium: 4 mEq/L (ref 3.5–5.1)
Sodium: 135 mEq/L (ref 135–145)
Total Protein: 7 g/dL (ref 6.0–8.3)

## 2012-08-29 LAB — CBC
MCHC: 33.8 g/dL (ref 30.0–36.0)
RDW: 13.2 % (ref 11.5–15.5)

## 2012-08-29 LAB — SURGICAL PCR SCREEN: MRSA, PCR: NEGATIVE

## 2012-08-29 SURGERY — OPEN REDUCTION INTERNAL FIXATION (ORIF) CLAVICULAR FRACTURE
Anesthesia: General | Site: Shoulder | Laterality: Left | Wound class: Clean

## 2012-08-29 MED ORDER — ROCURONIUM BROMIDE 100 MG/10ML IV SOLN
INTRAVENOUS | Status: DC | PRN
  Administered 2012-08-29: 30 mg via INTRAVENOUS

## 2012-08-29 MED ORDER — ARTIFICIAL TEARS OP OINT
TOPICAL_OINTMENT | OPHTHALMIC | Status: DC | PRN
  Administered 2012-08-29: 1 via OPHTHALMIC

## 2012-08-29 MED ORDER — GLYCOPYRROLATE 0.2 MG/ML IJ SOLN
INTRAMUSCULAR | Status: DC | PRN
  Administered 2012-08-29: 0.6 mg via INTRAVENOUS

## 2012-08-29 MED ORDER — NAPROXEN 500 MG PO TABS
500.0000 mg | ORAL_TABLET | Freq: Two times a day (BID) | ORAL | Status: DC
Start: 2012-08-29 — End: 2012-08-30
  Administered 2012-08-30: 500 mg via ORAL
  Filled 2012-08-29 (×3): qty 1

## 2012-08-29 MED ORDER — HYDROMORPHONE HCL PF 1 MG/ML IJ SOLN
0.2500 mg | INTRAMUSCULAR | Status: DC | PRN
  Administered 2012-08-29 (×6): 0.5 mg via INTRAVENOUS

## 2012-08-29 MED ORDER — NEOSTIGMINE METHYLSULFATE 1 MG/ML IJ SOLN
INTRAMUSCULAR | Status: DC | PRN
  Administered 2012-08-29: 4 mg via INTRAVENOUS

## 2012-08-29 MED ORDER — METOCLOPRAMIDE HCL 5 MG PO TABS
5.0000 mg | ORAL_TABLET | Freq: Three times a day (TID) | ORAL | Status: DC | PRN
  Filled 2012-08-29: qty 2

## 2012-08-29 MED ORDER — VITAMIN B-12 1000 MCG PO TABS
1000.0000 ug | ORAL_TABLET | Freq: Every day | ORAL | Status: DC
  Administered 2012-08-30: 1000 ug via ORAL
  Filled 2012-08-29 (×2): qty 1

## 2012-08-29 MED ORDER — BUPIVACAINE HCL 0.5 % IJ SOLN
INTRAMUSCULAR | Status: DC | PRN
  Administered 2012-08-29: 10 mL

## 2012-08-29 MED ORDER — ONDANSETRON HCL 4 MG PO TABS: 4.0000 mg | ORAL_TABLET | Freq: Four times a day (QID) | ORAL | Status: DC | PRN

## 2012-08-29 MED ORDER — ONDANSETRON HCL 4 MG/2ML IJ SOLN
INTRAMUSCULAR | Status: DC | PRN
  Administered 2012-08-29: 4 mg via INTRAVENOUS

## 2012-08-29 MED ORDER — ONDANSETRON HCL 4 MG/2ML IJ SOLN
4.0000 mg | Freq: Once | INTRAMUSCULAR | Status: AC | PRN
  Administered 2012-08-29: 4 mg via INTRAVENOUS

## 2012-08-29 MED ORDER — MIDAZOLAM HCL 5 MG/5ML IJ SOLN
INTRAMUSCULAR | Status: DC | PRN
  Administered 2012-08-29: 2 mg via INTRAVENOUS

## 2012-08-29 MED ORDER — METOCLOPRAMIDE HCL 5 MG/ML IJ SOLN: 5.0000 mg | Freq: Three times a day (TID) | INTRAMUSCULAR | Status: DC | PRN

## 2012-08-29 MED ORDER — HYDROMORPHONE HCL PF 1 MG/ML IJ SOLN
0.5000 mg | INTRAMUSCULAR | Status: DC | PRN
  Administered 2012-08-29 – 2012-08-30 (×3): 1 mg via INTRAVENOUS
  Filled 2012-08-29 (×3): qty 1

## 2012-08-29 MED ORDER — HYDROMORPHONE HCL PF 1 MG/ML IJ SOLN
INTRAMUSCULAR | Status: AC
  Filled 2012-08-29: qty 1

## 2012-08-29 MED ORDER — OXYCODONE-ACETAMINOPHEN 5-325 MG PO TABS
2.0000 | ORAL_TABLET | ORAL | Status: DC | PRN
  Administered 2012-08-29 – 2012-08-30 (×4): 2 via ORAL
  Filled 2012-08-29 (×4): qty 2

## 2012-08-29 MED ORDER — ALBUTEROL SULFATE HFA 108 (90 BASE) MCG/ACT IN AERS
2.0000 | INHALATION_SPRAY | RESPIRATORY_TRACT | Status: DC | PRN
  Filled 2012-08-29: qty 6.7

## 2012-08-29 MED ORDER — MUPIROCIN 2 % EX OINT: TOPICAL_OINTMENT | Freq: Two times a day (BID) | CUTANEOUS | Status: DC

## 2012-08-29 MED ORDER — OXYCODONE-ACETAMINOPHEN 5-325 MG PO TABS
2.0000 | ORAL_TABLET | ORAL | Status: DC | PRN
  Administered 2012-08-29: 2 via ORAL
  Filled 2012-08-29: qty 2

## 2012-08-29 MED ORDER — CEFAZOLIN SODIUM-DEXTROSE 2-3 GM-% IV SOLR
2.0000 g | INTRAVENOUS | Status: AC
  Administered 2012-08-29: 2 g via INTRAVENOUS

## 2012-08-29 MED ORDER — LACTATED RINGERS IV SOLN
INTRAVENOUS | Status: DC | PRN
  Administered 2012-08-29 (×2): via INTRAVENOUS

## 2012-08-29 MED ORDER — ADULT MULTIVITAMIN W/MINERALS CH
1.0000 | ORAL_TABLET | Freq: Every day | ORAL | Status: DC
  Administered 2012-08-30: 1 via ORAL
  Filled 2012-08-29 (×2): qty 1

## 2012-08-29 MED ORDER — PROPOFOL 10 MG/ML IV BOLUS
INTRAVENOUS | Status: DC | PRN
  Administered 2012-08-29: 200 mg via INTRAVENOUS

## 2012-08-29 MED ORDER — ONDANSETRON HCL 4 MG/2ML IJ SOLN
INTRAMUSCULAR | Status: AC
  Filled 2012-08-29: qty 2

## 2012-08-29 MED ORDER — LIDOCAINE HCL (CARDIAC) 20 MG/ML IV SOLN
INTRAVENOUS | Status: DC | PRN
  Administered 2012-08-29: 60 mg via INTRAVENOUS

## 2012-08-29 MED ORDER — DIPHENHYDRAMINE HCL 12.5 MG/5ML PO ELIX: 12.5000 mg | ORAL_SOLUTION | ORAL | Status: DC | PRN

## 2012-08-29 MED ORDER — BUPIVACAINE HCL (PF) 0.5 % IJ SOLN
INTRAMUSCULAR | Status: AC
  Filled 2012-08-29: qty 30

## 2012-08-29 MED ORDER — OXYCODONE-ACETAMINOPHEN 5-325 MG PO TABS: 2.0000 | ORAL_TABLET | ORAL | Status: DC | PRN

## 2012-08-29 MED ORDER — SODIUM CHLORIDE 0.9 % IV SOLN
INTRAVENOUS | Status: DC
  Administered 2012-08-29: 21:00:00 via INTRAVENOUS

## 2012-08-29 MED ORDER — CEFAZOLIN SODIUM-DEXTROSE 2-3 GM-% IV SOLR
INTRAVENOUS | Status: AC
  Filled 2012-08-29: qty 50

## 2012-08-29 MED ORDER — FENTANYL CITRATE 0.05 MG/ML IJ SOLN
INTRAMUSCULAR | Status: DC | PRN
  Administered 2012-08-29 (×3): 50 ug via INTRAVENOUS
  Administered 2012-08-29: 100 ug via INTRAVENOUS
  Administered 2012-08-29: 50 ug via INTRAVENOUS
  Administered 2012-08-29 (×2): 100 ug via INTRAVENOUS

## 2012-08-29 MED ORDER — ONDANSETRON HCL 4 MG/2ML IJ SOLN
4.0000 mg | Freq: Four times a day (QID) | INTRAMUSCULAR | Status: DC | PRN
  Administered 2012-08-29: 4 mg via INTRAVENOUS
  Filled 2012-08-29: qty 2

## 2012-08-29 MED ORDER — OXYCODONE-ACETAMINOPHEN 5-325 MG PO TABS
ORAL_TABLET | ORAL | Status: AC
  Administered 2012-08-29: 2 via ORAL
  Filled 2012-08-29: qty 2

## 2012-08-29 SURGICAL SUPPLY — 44 items
ADH SKN CLS APL DERMABOND .7 (GAUZE/BANDAGES/DRESSINGS) ×1
BIT DRILL 2.5X2.75 QC CALB (BIT) ×1 IMPLANT
CLOTH BEACON ORANGE TIMEOUT ST (SAFETY) ×2 IMPLANT
COVER SURGICAL LIGHT HANDLE (MISCELLANEOUS) ×2 IMPLANT
DERMABOND ADVANCED (GAUZE/BANDAGES/DRESSINGS) ×1
DERMABOND ADVANCED .7 DNX12 (GAUZE/BANDAGES/DRESSINGS) IMPLANT
DRAPE C-ARM 42X72 X-RAY (DRAPES) ×2 IMPLANT
DRAPE INCISE IOBAN 66X45 STRL (DRAPES) ×1 IMPLANT
DRAPE U-SHAPE 47X51 STRL (DRAPES) ×2 IMPLANT
DRSG EMULSION OIL 3X3 NADH (GAUZE/BANDAGES/DRESSINGS) ×2 IMPLANT
DRSG MEPILEX BORDER 4X8 (GAUZE/BANDAGES/DRESSINGS) ×1 IMPLANT
ELECT REM PT RETURN 9FT ADLT (ELECTROSURGICAL) ×2
ELECTRODE REM PT RTRN 9FT ADLT (ELECTROSURGICAL) ×1 IMPLANT
GLOVE BIOGEL PI IND STRL 7.5 (GLOVE) ×1 IMPLANT
GLOVE BIOGEL PI IND STRL 8 (GLOVE) ×1 IMPLANT
GLOVE BIOGEL PI INDICATOR 7.5 (GLOVE) ×1
GLOVE BIOGEL PI INDICATOR 8 (GLOVE) ×1
GLOVE ECLIPSE 7.0 STRL STRAW (GLOVE) ×2 IMPLANT
GLOVE ORTHO TXT STRL SZ7.5 (GLOVE) ×2 IMPLANT
GOWN PREVENTION PLUS LG XLONG (DISPOSABLE) ×1 IMPLANT
GOWN STRL NON-REIN LRG LVL3 (GOWN DISPOSABLE) ×2 IMPLANT
KIT BASIN OR (CUSTOM PROCEDURE TRAY) ×2 IMPLANT
KIT ROOM TURNOVER OR (KITS) ×2 IMPLANT
MANIFOLD NEPTUNE II (INSTRUMENTS) ×2 IMPLANT
NDL HYPO 25GX1X1/2 BEV (NEEDLE) IMPLANT
NEEDLE HYPO 25GX1X1/2 BEV (NEEDLE) IMPLANT
NS IRRIG 1000ML POUR BTL (IV SOLUTION) ×2 IMPLANT
PACK SHOULDER (CUSTOM PROCEDURE TRAY) ×2 IMPLANT
PAD ARMBOARD 7.5X6 YLW CONV (MISCELLANEOUS) ×4 IMPLANT
PLATE LOCK 8H 103 BILAT FIB (Plate) ×1 IMPLANT
SCREW LOW PROFILE 18MMX3.5MM (Screw) ×2 IMPLANT
SCREW NON LOCKING LP 3.5 14MM (Screw) ×4 IMPLANT
SCREW NON LOCKING LP 3.5 16MM (Screw) ×2 IMPLANT
SPONGE GAUZE 4X4 12PLY (GAUZE/BANDAGES/DRESSINGS) ×2 IMPLANT
SPONGE LAP 18X18 X RAY DECT (DISPOSABLE) ×4 IMPLANT
STRIP CLOSURE SKIN 1/2X4 (GAUZE/BANDAGES/DRESSINGS) ×2 IMPLANT
SUCTION FRAZIER TIP 10 FR DISP (SUCTIONS) ×2 IMPLANT
SUT PROLENE 3 0 PS 1 (SUTURE) ×2 IMPLANT
SUT VIC AB 2-0 CT1 27 (SUTURE) ×2
SUT VIC AB 2-0 CT1 TAPERPNT 27 (SUTURE) ×1 IMPLANT
SUT VICRYL 0 CT 1 36IN (SUTURE) ×2 IMPLANT
SYR CONTROL 10ML LL (SYRINGE) IMPLANT
WATER STERILE IRR 1000ML POUR (IV SOLUTION) ×2 IMPLANT
YANKAUER SUCT BULB TIP NO VENT (SUCTIONS) ×2 IMPLANT

## 2012-08-29 NOTE — Progress Notes (Signed)
Called Dr.Edwards to see about obtaining an order to give Percocet 5-325mg  Q4hr while pt waiting d/t surgery not until 1800 and was told to be here at 0900;instructions for me to call Dr.Yates for that order

## 2012-08-29 NOTE — Progress Notes (Signed)
Spoke with Dr.Yates and received an order to give  2 Percocet 5-325mg  every 4hrs with sip of water

## 2012-08-29 NOTE — Progress Notes (Signed)
Pt asked what time surgery would be,confirmed with Sonja in OR that it would be 1800.Pt very upset and crying bc she was dropped off and has no way to leave and doesn't have pain meds;reassured her that I would call to see if i could obtain order to give her her pain med;pt ok now

## 2012-08-29 NOTE — Anesthesia Preprocedure Evaluation (Addendum)
Anesthesia Evaluation  Patient identified by MRN, date of birth, ID band Patient awake    Reviewed: Allergy & Precautions, H&P , NPO status , Patient's Chart, lab work & pertinent test results  History of Anesthesia Complications Negative for: history of anesthetic complications  Airway Mallampati: II TM Distance: >3 FB Neck ROM: Full    Dental  (+) Teeth Intact and Dental Advisory Given   Pulmonary asthma , COPD COPD inhaler, former smoker,          Cardiovascular + dysrhythmias Rhythm:regular Rate:Normal     Neuro/Psych Anxiety Depression  Neuromuscular disease    GI/Hepatic (+) Hepatitis -, C  Endo/Other    Renal/GU Renal disease     Musculoskeletal  (+) Fibromyalgia -  Abdominal   Peds  Hematology   Anesthesia Other Findings   Reproductive/Obstetrics                          Anesthesia Physical Anesthesia Plan  ASA: II  Anesthesia Plan: General   Post-op Pain Management:    Induction: Intravenous  Airway Management Planned: Oral ETT  Additional Equipment:   Intra-op Plan:   Post-operative Plan: Extubation in OR  Informed Consent: I have reviewed the patients History and Physical, chart, labs and discussed the procedure including the risks, benefits and alternatives for the proposed anesthesia with the patient or authorized representative who has indicated his/her understanding and acceptance.   Dental advisory given  Plan Discussed with: Anesthesiologist and Surgeon  Anesthesia Plan Comments:       Anesthesia Quick Evaluation

## 2012-08-29 NOTE — Transfer of Care (Signed)
Immediate Anesthesia Transfer of Care Note  Patient: [name]  Procedure(s) Performed: Procedure(s) with comments: OPEN REDUCTION INTERNAL FIXATION (ORIF) CLAVICULAR FRACTURE (Left) - Open Reduction Internal Fixation Left Clavicle Fracture  Patient Location: PACU  Anesthesia Type:General  Level of Consciousness: awake  Airway & Oxygen Therapy: Patient Spontanous Breathing and Patient connected to face mask oxygen  Post-op Assessment: Report given to PACU RN and Post -op Vital signs reviewed and stable  Post vital signs: Reviewed and stable  Complications: No apparent anesthesia complications

## 2012-08-29 NOTE — Brief Op Note (Signed)
08/29/2012  6:06 PM  PATIENT:  [name]  37 y.o. female  PRE-OPERATIVE DIAGNOSIS:  Left Clavicle Fracture  POST-OPERATIVE DIAGNOSIS:  Left clavicle fracture  PROCEDURE:  Procedure(s) with comments: OPEN REDUCTION INTERNAL FIXATION (ORIF) CLAVICULAR FRACTURE (Left) - Open Reduction Internal Fixation Left Clavicle Fracture  SURGEON:  Surgeon(s) and Role:    * Eldred Manges, MD - Primary  PHYSICIAN ASSISTANT:   ASSISTANTS: none   ANESTHESIA:   general  EBL:   minimal  BLOOD ADMINISTERED:none  DRAINS: none   LOCAL MEDICATIONS USED:  MARCAINE     SPECIMEN:  No Specimen  DISPOSITION OF SPECIMEN:  N/A  COUNTS:  YES  TOURNIQUET:  * No tourniquets in log *  DICTATION: .Other Dictation: Dictation Number [id number]  PLAN OF CARE: Admit for overnight observation  PATIENT DISPOSITION:  PACU - hemodynamically stable.   Delay start of Pharmacological VTE agent (>24hrs) due to surgical blood loss or risk of bleeding: not applicable

## 2012-08-29 NOTE — Interval H&P Note (Signed)
History and Physical Interval Note:  08/29/2012 5:50 PM  [name]  has presented today for surgery, with the diagnosis of Left Clavicle Fracture  The various methods of treatment have been discussed with the patient and family. After consideration of risks, benefits and other options for treatment, the patient has consented to  Procedure(s) with comments: OPEN REDUCTION INTERNAL FIXATION (ORIF) CLAVICULAR FRACTURE (Left) - Open Reduction Internal Fixation Left Clavicle Fracture as a surgical intervention .  The patient's history has been reviewed, patient examined, no change in status, stable for surgery.  I have reviewed the patient's chart and labs.  Questions were answered to the patient's satisfaction.     [name]

## 2012-08-29 NOTE — Preoperative (Signed)
Beta Blockers   Reason not to administer Beta Blockers:Not Applicable 

## 2012-08-29 NOTE — Anesthesia Procedure Notes (Signed)
Procedure Name: Intubation Date/Time: 08/29/2012 6:09 PM Performed by: Gayla Medicus Pre-anesthesia Checklist: Patient identified, Timeout performed, Suction available, Emergency Drugs available and Patient being monitored Patient Re-evaluated:Patient Re-evaluated prior to inductionOxygen Delivery Method: Circle system utilized Preoxygenation: Pre-oxygenation with 100% oxygen Intubation Type: IV induction Ventilation: Mask ventilation without difficulty Laryngoscope Size: Mac and 3 Grade View: Grade II Tube type: Oral Tube size: 7.5 mm Number of attempts: 1 Airway Equipment and Method: Stylet and LTA kit utilized Placement Confirmation: ETT inserted through vocal cords under direct vision,  positive ETCO2 and breath sounds checked- equal and bilateral Secured at: 21 cm Tube secured with: Tape Dental Injury: Teeth and Oropharynx as per pre-operative assessment

## 2012-08-29 NOTE — H&P (Signed)
  [facility]   A Division of [employer]   [address] [place], [place] [address] Telephone: [phone]  Fax: [phone]     PATIENT: [name], [name]   MR#: [id number]  DOB: 11-18-1975   Visit Date: 08/27/2012     CHIEF COMPLAINT:  Acute displaced left clavicle fracture after a fall.     HISTORY OF PRESENT ILLNESS:  This 37 year old female [place] on her shoulder with a 2 cm displaced midshaft clavicle fracture.  She has had extreme pain.  Percocet has not controlled her pain.  She is also taking Naprosyn.  She is normally followed by Dr. Pollie Meyer.     ALLERGIES:  She is allergic to sulfa.     SURGICAL HISTORY:  She had previous surgery for an ankle fracture in November.  She is not sure if she has had a bone density test in the past.     FAMILY HISTORY:  Positive for diabetes, unknown type of cancer, hypertension.   SOCIAL HISTORY:  The patient is single.  She smokes 1/2 pack her day x10 years.  Does not drink.   REVIEW OF SYSTEMS:  Positive for asthma. At one point she was on a chronic suppressant inhaler.  She also has had hepatitis in the distant past.  Negative for DVT, epilepsy, problems with surgery or anesthesias.    PHYSICAL EXAMINATION:  The patient is alert and oriented.  WDWN.  NAD.   BP is 130/80.  Pulse is 72.  Height is 5 feet 6 inches, weight is 160 pounds.  Lungs are clear.  Heart regular rate and rhythm.  Good visual acuity.  No supraclavicular lymphadenopathy.  A small dermal puncture site, less than a millimeter, one 2 cm above, one 2 cm below the region of the clavicle.  This is a closed fracture.  She has extreme pain with any motion of the arm.  She is wearing a shoulder immobilizer.  Neurologically she is intact in the left upper extremity with intact sensation.  Axillary sensation is intact.  Normal pulses.  Abdomen is soft and nontender.  Normal gait pattern.         ASSESSMENT:   Displaced clavicle fracture.   PLAN:  We discussed options.  She has significant displacement and states that she would like to proceed with open reduction and internal fixation.   The planned procedure was discussed with the patient and the risks of surgery including nonunion, reoperation, problems with hardware and plate breakage.  We discussed nonsurgical treatment as well and she states she would like to proceed with operative intervention.  The procedure was discussed.  All questions were answered.  Informed consent was obtained.     For additional information please see handwritten notes, reports, orders and prescriptions in this chart.      Shravya Wickwire C. Ophelia Charter, M.D.    Auto-Authenticated by Veverly Fells. Ophelia Charter, M.D.  MCY/gm DD: 08/27/2012  D

## 2012-08-29 NOTE — Anesthesia Postprocedure Evaluation (Signed)
  Anesthesia Post-op Note  Patient: [name]  Procedure(s) Performed: Procedure(s) with comments: OPEN REDUCTION INTERNAL FIXATION (ORIF) CLAVICULAR FRACTURE (Left) - Open Reduction Internal Fixation Left Clavicle Fracture  Patient Location: PACU  Anesthesia Type:General  Level of Consciousness: awake, alert , oriented and patient cooperative  Airway and Oxygen Therapy: Patient Spontanous Breathing  Post-op Pain: mild  Post-op Assessment: Post-op Vital signs reviewed, Patient's Cardiovascular Status Stable, Respiratory Function Stable, Patent Airway, No signs of Nausea or vomiting and Pain level controlled  Post-op Vital Signs: stable  Complications: No apparent anesthesia complications

## 2012-08-30 ENCOUNTER — Encounter (HOSPITAL_COMMUNITY): Payer: Self-pay | Admitting: General Practice

## 2012-08-30 DIAGNOSIS — S42002A Fracture of unspecified part of left clavicle, initial encounter for closed fracture: Secondary | ICD-10-CM

## 2012-08-30 MED ORDER — ACETAMINOPHEN 325 MG PO TABS
ORAL_TABLET | ORAL | Status: AC
  Filled 2012-08-30: qty 1

## 2012-08-30 MED ORDER — OXYCODONE-ACETAMINOPHEN 5-325 MG PO TABS: 2.0000 | ORAL_TABLET | ORAL | Status: DC | PRN

## 2012-08-30 NOTE — Progress Notes (Signed)
Subjective: 1 Day Post-Op Procedure(s) (LRB): OPEN REDUCTION INTERNAL FIXATION (ORIF) CLAVICULAR FRACTURE (Left) Patient reports pain as 7 on 0-10 scale.    Objective: Vital signs in last 24 hours: Temp:  [97.2 F (36.2 C)-98.2 F (36.8 C)] 97.2 F (36.2 C) (06/05 0604) Pulse Rate:  [83-103] 83 (06/05 0604) Resp:  [13-21] 18 (06/05 0604) BP: (117-164)/(86-106) 148/101 mmHg (06/05 0604) SpO2:  [92 %-98 %] 97 % (06/05 0604) Weight:  [72.576 kg (160 lb)] 72.576 kg (160 lb) (06/04 1105)  Intake/Output from previous day: 06/04 0701 - 06/05 0700 In: 1773.8 [I.V.:1773.8] Out: 50 [Blood:50] Intake/Output this shift:     Recent Labs  08/29/12 0931  HGB 12.8    Recent Labs  08/29/12 0931  WBC 4.1  RBC 3.99  HCT 37.9  PLT 215    Recent Labs  08/29/12 0931  NA 135  K 4.0  CL 102  CO2 26  BUN 16  CREATININE 0.63  GLUCOSE 98  CALCIUM 8.8   No results found for this basename: LABPT, INR,  in the last 72 hours  Neurologically intact  Assessment/Plan: 1 Day Post-Op Procedure(s) (LRB): OPEN REDUCTION INTERNAL FIXATION (ORIF) CLAVICULAR FRACTURE (Left) Plan: discharge home. Has 20 percocet at home , Rx given for 30 more so she has enough until ROV next week with me.  Pt anxious which makes her pain worse. She is ready to go home, ambulatory to [facility], OK to shower at home. Sling PRN. Office 1 wk.   YATES,MARK C 08/30/2012, 8:00 AM

## 2012-08-30 NOTE — Op Note (Signed)
[name], [name] NO.:  [id number]  MEDICAL RECORD NO.:  [id number]  LOCATION:  5N07C                        FACILITY:  [facility]  PHYSICIAN:  Shloime Keilman C. [name], M.D.    DATE OF BIRTH:  21-Aug-1975  DATE OF PROCEDURE:  08/29/2012 DATE OF DISCHARGE:                              OPERATIVE REPORT   PREOPERATIVE DIAGNOSIS:  Displaced left clavicle fracture.  POSTOPERATIVE DIAGNOSIS:  Displaced left clavicle fracture.  PROCEDURE:  Open reduction internal fixation, left clavicle.  SURGEON:  [name], M.D.  ANESTHESIA:  General.  ESTIMATED BLOOD LOSS:  Minimal.  IMPLANTS:  Biomet composite plate, 8 hole with nonlocking screws.  PROCEDURE IN DETAIL:  After induction of general anesthesia, preoperative Ancef prophylaxis, standard prepping and draping with the patient in beach chair position with the old pad underneath her elbow, DuraPrep had been used, sterile skin marker, Betadine Steri-Drape and split sheets and drapes.  Time-out procedure was completed.  A S-shaped incision was made over the clavicle subperiosteal dissection on the medial and lateral fragment working toward the midline from the displaced fracture.  Fracture was reduced, held with self-retaining clamp with an 8-holed Biomet composite plate placed with the blue guides for locking screws lateral.  The plate was contoured, held, and then holes were drilled self tapping.  Nonlocking screws were inserted, 14-16 mm in length, and once completed, confirmed with the C-arm in good position, good length.  The wound was vigorously irrigated.  Fascia over the muscle and periosteum was closed with 2-0 Vicryl, 2-0 Vicryl subcutaneous tissue, 4-0 Vicryl subcuticular closure.  Dermabond on the skin, followed by Marcaine infiltration in the skin.  The fracture site 10 mL.  The patient tolerated the procedure well.  The patient has had a recent diagnosis of hepatitis C.  There was no needle sticks or breaks in  the sterile technique during the procedure.     [name], M.D.     MCY/MEDQ  D:  08/29/2012  T:  08/30/2012  Job:  [id number]

## 2012-08-31 ENCOUNTER — Encounter (HOSPITAL_COMMUNITY): Payer: Self-pay | Admitting: Orthopaedic Surgery

## 2012-08-31 NOTE — Discharge Summary (Signed)
Physician Discharge Summary  Patient ID: [name] MRN: [id number] DOB/AGE: 1975-12-30 37 y.o.  Admit date: 08/29/2012 Discharge date: 08/31/2012  Admission Diagnoses:left displaced clavicle fracture  Discharge Diagnoses: same Active Problems:   Fracture of clavicle, left, closed   Discharged Condition: good  Hospital Course: underwent ORIF clavicle. Post op IV and PO pain meds.   Consults: None  Significant Diagnostic Studies:   Treatments: surgery: as above  Discharge Exam: Blood pressure 148/101, pulse 83, temperature 97.2 F (36.2 C), temperature source Oral, resp. rate 18, height 5\' 6"  (1.676 m), weight 72.576 kg (160 lb), SpO2 97.00%. Neurologic: Mental status: Alert, oriented, thought content appropriate Sensory: normal, sensory and motor intact both UE and LE  Disposition: 01-Home or Self Care     Medication List    STOP taking these medications       ibuprofen 200 MG tablet  Commonly known as:  ADVIL,MOTRIN      TAKE these medications       albuterol 108 (90 BASE) MCG/ACT inhaler  Commonly known as:  PROVENTIL HFA;VENTOLIN HFA  Inhale 2 puffs into the lungs every 4 (four) hours as needed for wheezing.     CALCIUM PO  Take 1 tablet by mouth daily.     fish oil-omega-3 fatty acids 1000 MG capsule  Take 1 g by mouth 2 (two) times daily.     multivitamin with minerals Tabs  Take 1 tablet by mouth daily.     mupirocin ointment 2 %  Commonly known as:  BACTROBAN  Apply topically 2 (two) times daily. As instructed .     naproxen 500 MG tablet  Commonly known as:  NAPROSYN  Take 1 tablet (500 mg total) by mouth 2 (two) times daily.     oxyCODONE-acetaminophen 5-325 MG per tablet  Commonly known as:  PERCOCET  Take 1 tablet by mouth every 4 (four) hours as needed for pain.     oxyCODONE-acetaminophen 5-325 MG per tablet  Commonly known as:  PERCOCET/ROXICET  Take 2 tablets by mouth every 4 (four) hours as needed.     oxyCODONE-acetaminophen  5-325 MG per tablet  Commonly known as:  PERCOCET  Take 2 tablets by mouth every 4 (four) hours as needed.     vitamin B-12 1000 MCG tablet  Commonly known as:  CYANOCOBALAMIN  Take 1,000 mcg by mouth daily.         SignedEldred Manges 08/31/2012, 7:16 AM

## 2012-12-15 ENCOUNTER — Emergency Department (HOSPITAL_COMMUNITY): Payer: No Typology Code available for payment source

## 2012-12-15 ENCOUNTER — Emergency Department (HOSPITAL_COMMUNITY)
Admission: EM | Admit: 2012-12-15 | Discharge: 2012-12-15 | Disposition: A | Discharge status: Home / Self Care | Payer: No Typology Code available for payment source | Attending: Emergency Medicine | Admitting: Emergency Medicine

## 2012-12-15 ENCOUNTER — Encounter (HOSPITAL_COMMUNITY): Payer: Self-pay | Admitting: Emergency Medicine

## 2012-12-15 DIAGNOSIS — M542 Cervicalgia: Secondary | ICD-10-CM

## 2012-12-15 DIAGNOSIS — Z8659 Personal history of other mental and behavioral disorders: Secondary | ICD-10-CM | POA: Insufficient documentation

## 2012-12-15 DIAGNOSIS — Y9389 Activity, other specified: Secondary | ICD-10-CM | POA: Diagnosis not present

## 2012-12-15 DIAGNOSIS — Z87440 Personal history of urinary (tract) infections: Secondary | ICD-10-CM | POA: Insufficient documentation

## 2012-12-15 DIAGNOSIS — R110 Nausea: Secondary | ICD-10-CM | POA: Insufficient documentation

## 2012-12-15 DIAGNOSIS — Z8619 Personal history of other infectious and parasitic diseases: Secondary | ICD-10-CM | POA: Insufficient documentation

## 2012-12-15 DIAGNOSIS — S59909A Unspecified injury of unspecified elbow, initial encounter: Secondary | ICD-10-CM | POA: Diagnosis present

## 2012-12-15 DIAGNOSIS — Z79899 Other long term (current) drug therapy: Secondary | ICD-10-CM | POA: Insufficient documentation

## 2012-12-15 DIAGNOSIS — J449 Chronic obstructive pulmonary disease, unspecified: Secondary | ICD-10-CM | POA: Insufficient documentation

## 2012-12-15 DIAGNOSIS — F17200 Nicotine dependence, unspecified, uncomplicated: Secondary | ICD-10-CM | POA: Insufficient documentation

## 2012-12-15 DIAGNOSIS — M25422 Effusion, left elbow: Secondary | ICD-10-CM

## 2012-12-15 DIAGNOSIS — S0990XA Unspecified injury of head, initial encounter: Secondary | ICD-10-CM | POA: Insufficient documentation

## 2012-12-15 DIAGNOSIS — Y92410 Unspecified street and highway as the place of occurrence of the external cause: Secondary | ICD-10-CM | POA: Diagnosis not present

## 2012-12-15 DIAGNOSIS — S0993XA Unspecified injury of face, initial encounter: Secondary | ICD-10-CM | POA: Insufficient documentation

## 2012-12-15 DIAGNOSIS — J4489 Other specified chronic obstructive pulmonary disease: Secondary | ICD-10-CM | POA: Insufficient documentation

## 2012-12-15 DIAGNOSIS — S6990XA Unspecified injury of unspecified wrist, hand and finger(s), initial encounter: Secondary | ICD-10-CM | POA: Insufficient documentation

## 2012-12-15 DIAGNOSIS — Z87442 Personal history of urinary calculi: Secondary | ICD-10-CM | POA: Diagnosis not present

## 2012-12-15 DIAGNOSIS — R519 Headache, unspecified: Secondary | ICD-10-CM

## 2012-12-15 MED ORDER — NAPROXEN 250 MG PO TABS
500.0000 mg | ORAL_TABLET | Freq: Once | ORAL | Status: AC
  Administered 2012-12-15: 500 mg via ORAL
  Filled 2012-12-15: qty 2

## 2012-12-15 MED ORDER — OXYCODONE-ACETAMINOPHEN 5-325 MG PO TABS
1.0000 | ORAL_TABLET | Freq: Once | ORAL | Status: AC
  Administered 2012-12-15: 1 via ORAL
  Filled 2012-12-15: qty 1

## 2012-12-15 MED ORDER — OXYCODONE-ACETAMINOPHEN 5-325 MG PO TABS: 1.0000 | ORAL_TABLET | Freq: Four times a day (QID) | ORAL | Status: DC | PRN

## 2012-12-15 MED ORDER — METHOCARBAMOL 500 MG PO TABS: 1000.0000 mg | ORAL_TABLET | Freq: Four times a day (QID) | ORAL | Status: DC

## 2012-12-15 MED ORDER — NAPROXEN 500 MG PO TABS: 500.0000 mg | ORAL_TABLET | Freq: Two times a day (BID) | ORAL | Status: DC

## 2012-12-15 MED ORDER — ONDANSETRON 4 MG PO TBDP
8.0000 mg | ORAL_TABLET | Freq: Once | ORAL | Status: AC
  Administered 2012-12-15: 8 mg via ORAL
  Filled 2012-12-15: qty 2
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
## 2012-12-16 NOTE — ED Provider Notes (Signed)
Medical screening examination/treatment/procedure(s) were performed by non-physician practitioner and as supervising physician I was immediately available for consultation/collaboration.   Roney Marion, MD 12/16/12 [phone]

## 2012-12-31 IMAGING — CR DG HUMERUS 2V *R*
2 series · 2 of 2 positions shown · non-contrast
Comparison: Plain films 08/12/2008

CLINICAL DATA: Right lateral humeral pain

RIGHT HUMERUS - 2+ VIEW

[view not recorded (1 of 2)]
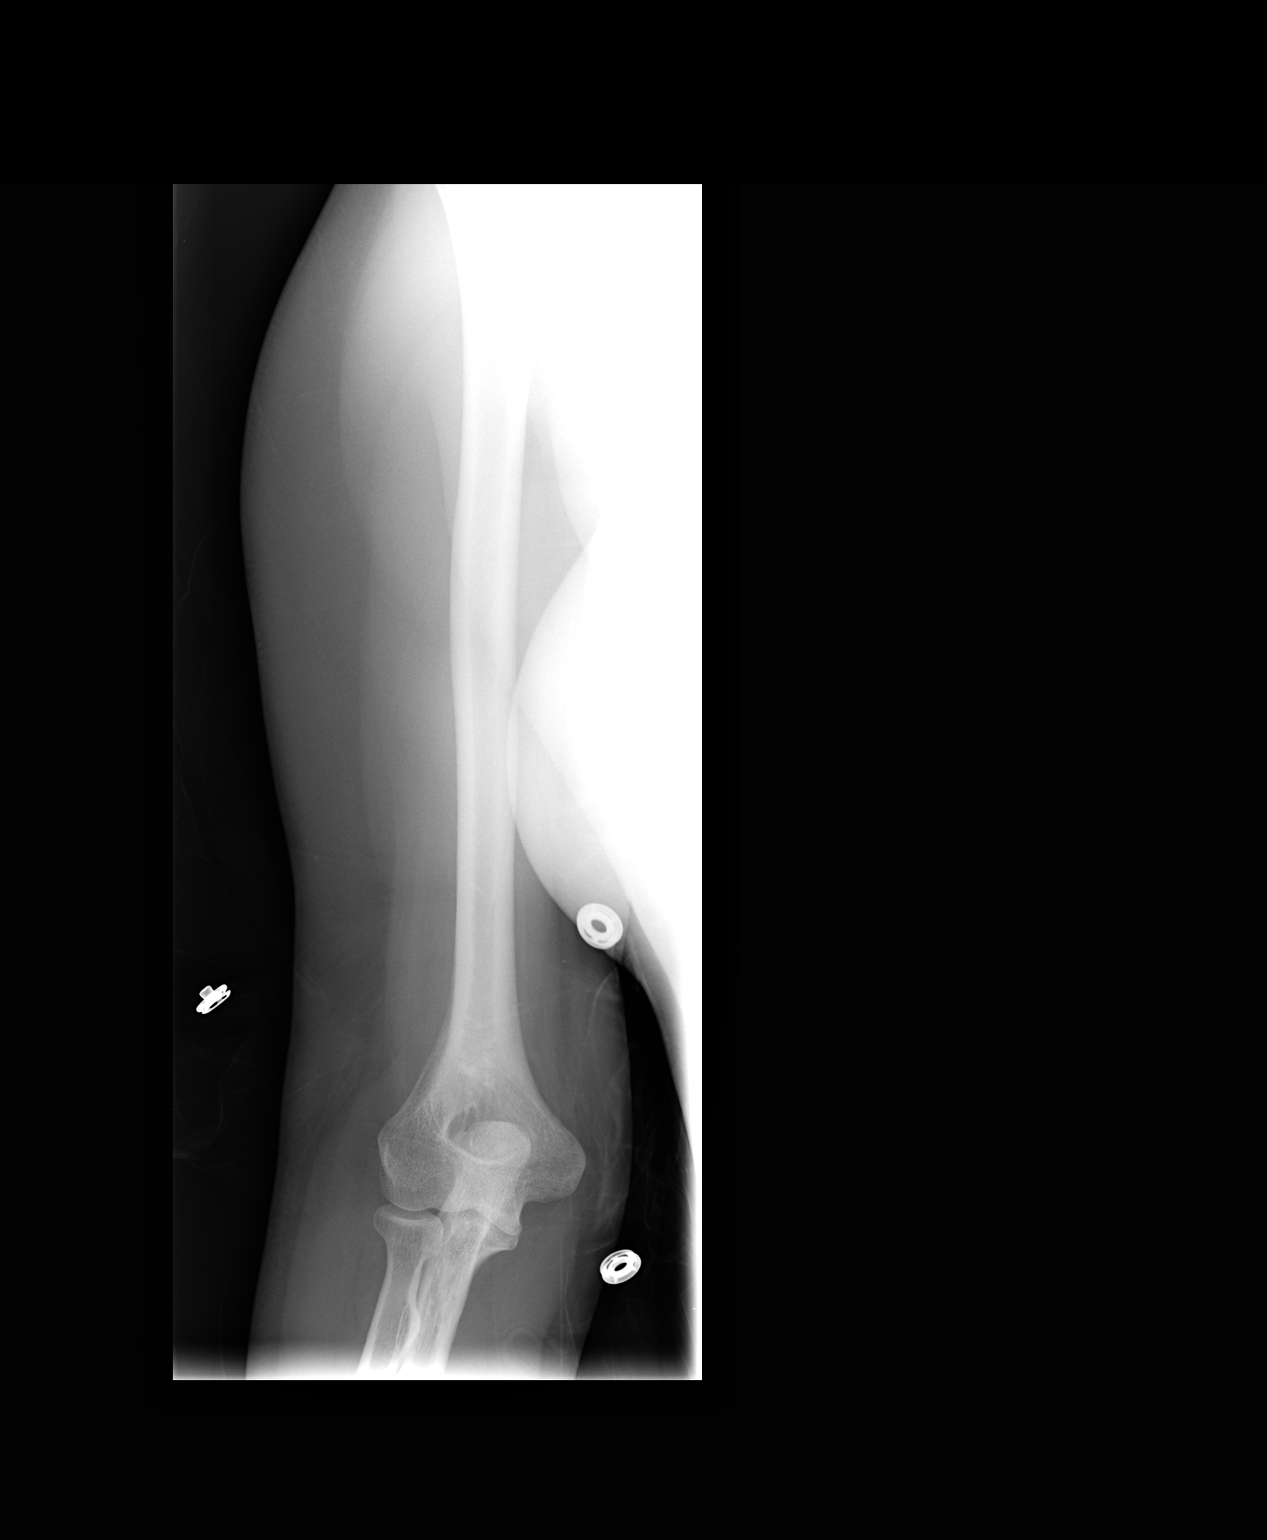

[view not recorded (2 of 2)]
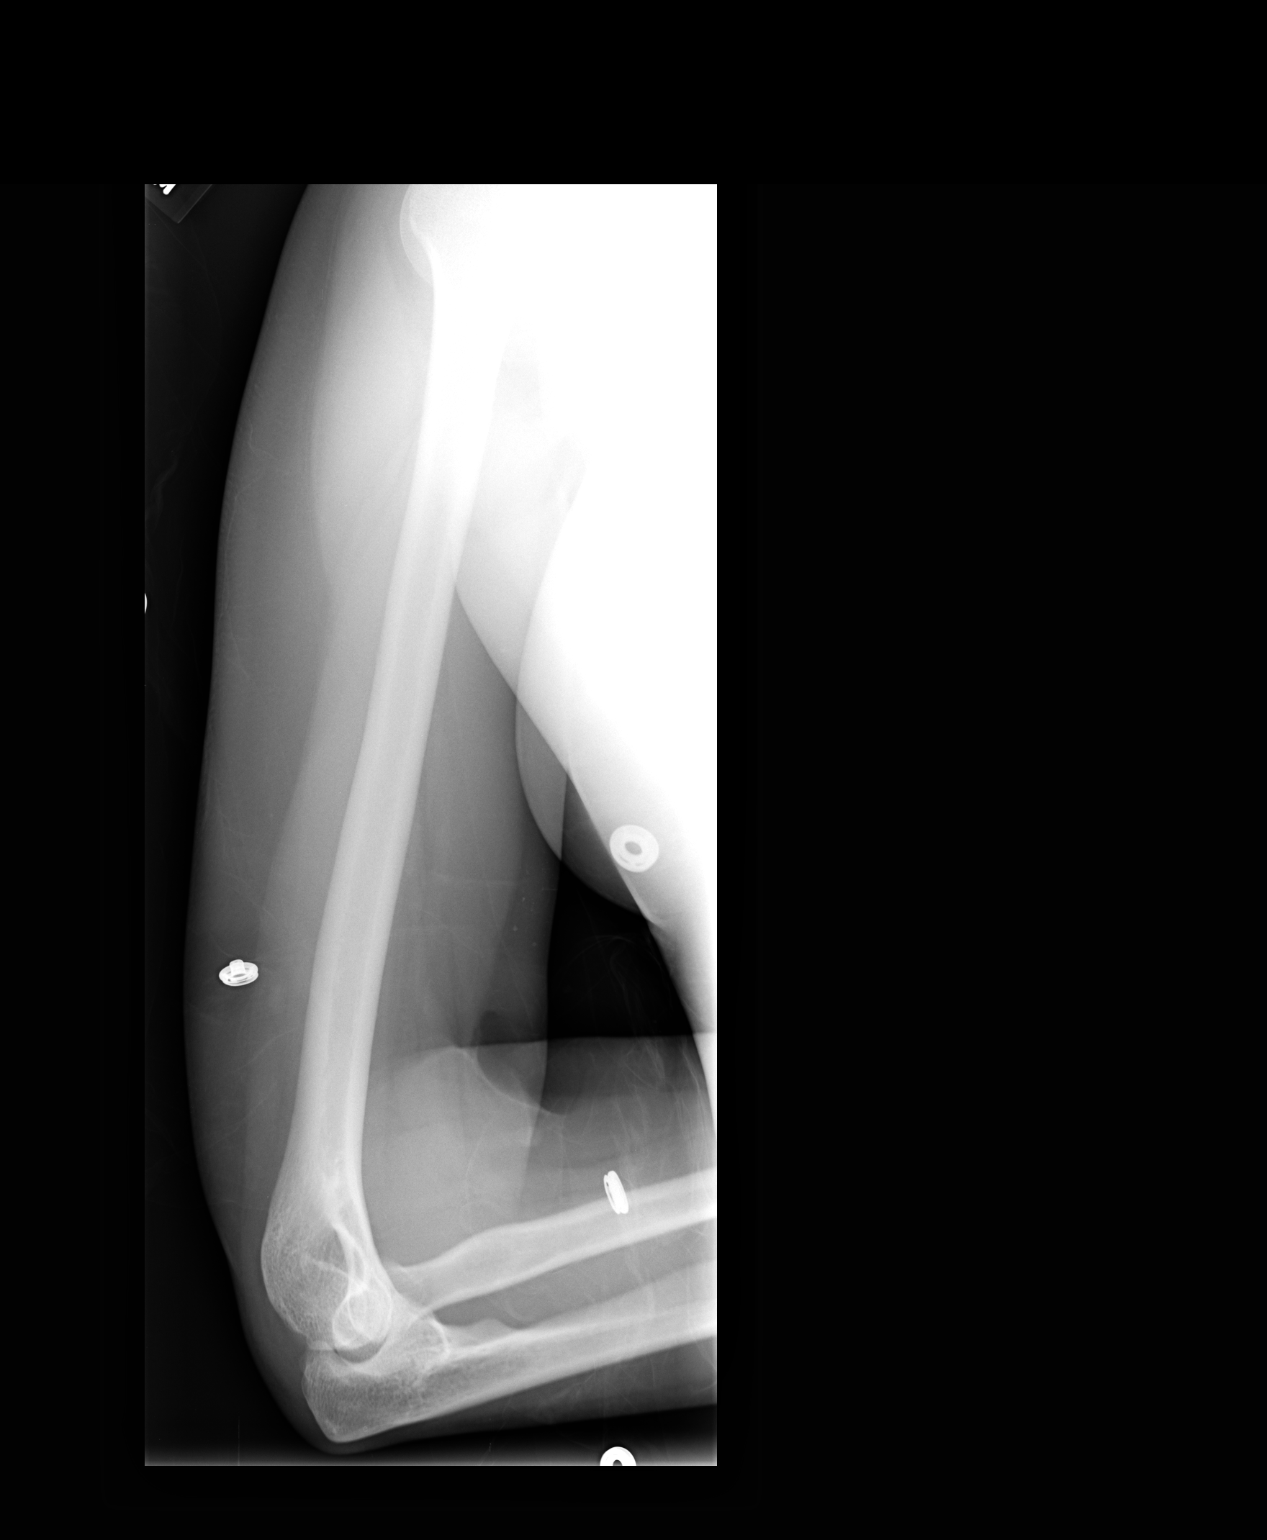

[2 of 2 positions shown; findings below may reference images not displayed]

FINDINGS: No evidence of fracture or dislocation of the right
humerus.
IMPRESSION: No evidence of fracture.
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
## 2013-01-15 ENCOUNTER — Emergency Department (INDEPENDENT_AMBULATORY_CARE_PROVIDER_SITE_OTHER)
Admission: EM | Admit: 2013-01-15 | Discharge: 2013-01-15 | Disposition: A | Discharge status: Home / Self Care | Payer: Medicaid Other | Source: Home / Self Care | Attending: Family Medicine | Admitting: Family Medicine

## 2013-01-15 ENCOUNTER — Encounter (HOSPITAL_COMMUNITY): Payer: Self-pay | Admitting: Emergency Medicine

## 2013-01-15 DIAGNOSIS — J441 Chronic obstructive pulmonary disease with (acute) exacerbation: Secondary | ICD-10-CM

## 2013-01-15 MED ORDER — ALBUTEROL SULFATE HFA 108 (90 BASE) MCG/ACT IN AERS: 1.0000 | INHALATION_SPRAY | Freq: Four times a day (QID) | RESPIRATORY_TRACT | Status: DC | PRN

## 2013-01-15 MED ORDER — AZITHROMYCIN 250 MG PO TABS: 250.0000 mg | ORAL_TABLET | Freq: Every day | ORAL | Status: DC

## 2013-01-15 MED ORDER — ALBUTEROL SULFATE (5 MG/ML) 0.5% IN NEBU
5.0000 mg | INHALATION_SOLUTION | Freq: Once | RESPIRATORY_TRACT | Status: AC
  Administered 2013-01-15: 5 mg via RESPIRATORY_TRACT

## 2013-01-15 MED ORDER — ALBUTEROL SULFATE (5 MG/ML) 0.5% IN NEBU
INHALATION_SOLUTION | RESPIRATORY_TRACT | Status: AC
  Filled 2013-01-15: qty 1

## 2013-01-15 MED ORDER — HYDROCODONE-ACETAMINOPHEN 5-325 MG PO TABS: 0.5000 | ORAL_TABLET | ORAL | Status: DC | PRN

## 2013-01-15 MED ORDER — IPRATROPIUM BROMIDE 0.02 % IN SOLN
RESPIRATORY_TRACT | Status: AC
  Filled 2013-01-15: qty 2.5

## 2013-01-15 MED ORDER — IPRATROPIUM BROMIDE 0.02 % IN SOLN
0.5000 mg | Freq: Once | RESPIRATORY_TRACT | Status: AC
  Administered 2013-01-15: 0.5 mg via RESPIRATORY_TRACT

## 2013-01-15 MED ORDER — PREDNISONE 50 MG PO TABS: 50.0000 mg | ORAL_TABLET | Freq: Every day | ORAL | Status: DC

## 2013-01-15 MED ORDER — ALBUTEROL SULFATE HFA 108 (90 BASE) MCG/ACT IN AERS
INHALATION_SPRAY | RESPIRATORY_TRACT | Status: AC
  Filled 2013-01-15: qty 6.7

## 2013-01-15 MED ORDER — SODIUM CHLORIDE 0.9 % IJ SOLN
INTRAMUSCULAR | Status: AC
  Filled 2013-01-15: qty 3

## 2013-01-15 NOTE — ED Provider Notes (Signed)
[name] is a 37 y.o. female who presents to Urgent Care today for one week of mildly productive cough worsening over the last few days. Patient additionally notes some wheezing. She has run out of her albuterol. Additionally she notes some mild body aching. She denies any fevers or shortness of breath nausea vomiting or diarrhea. She has not tried other medications. She has a history of asthma and COPD, and she continues to smoke cigarettes.    Past Medical History  Diagnosis Date  . Asthma   . Emphysema   . COPD (chronic obstructive pulmonary disease)   . Fibromyalgia, primary   . Anxiety   . Depressed   . Nephrolithiasis   . UTI (lower urinary tract infection)   . Kidney stone   . Hepatitis C   . Dysrhythmia   . Fibromyalgia    History  Substance Use Topics  . Smoking status: Current Every Day Smoker -- 0.40 packs/day for 10 years    Types: Cigarettes  . Smokeless tobacco: Never Used  . Alcohol Use: Yes     Comment: occasionally   ROS as above Medications reviewed. Current Facility-Administered Medications  Medication Dose Route Frequency Provider Last Rate Last Dose  . albuterol (PROVENTIL HFA;VENTOLIN HFA) 108 (90 BASE) MCG/ACT inhaler 1-2 puff  1-2 puff Inhalation Q6H PRN Rodolph Bong, MD       Current Outpatient Prescriptions  Medication Sig Dispense Refill  . albuterol (PROVENTIL HFA;VENTOLIN HFA) 108 (90 BASE) MCG/ACT inhaler Inhale 2 puffs into the lungs every 4 (four) hours as needed for wheezing.  1 Inhaler  0  . azithromycin (ZITHROMAX) 250 MG tablet Take 1 tablet (250 mg total) by mouth daily. Take first 2 tablets together, then 1 every day until finished.  6 tablet  0  . CALCIUM PO Take 1 tablet by mouth daily.      . fish oil-omega-3 fatty acids 1000 MG capsule Take 1 g by mouth 2 (two) times daily.      Marland Kitchen HYDROcodone-acetaminophen (NORCO/VICODIN) 5-325 MG per tablet Take 0.5-1 tablets by mouth every 4 (four) hours as needed for pain.  10 tablet  0  .  Multiple Vitamin (MULTIVITAMIN WITH MINERALS) TABS Take 1 tablet by mouth daily.      . predniSONE (DELTASONE) 50 MG tablet Take 1 tablet (50 mg total) by mouth daily.  5 tablet  0  . vitamin B-12 (CYANOCOBALAMIN) 1000 MCG tablet Take 1,000 mcg by mouth daily.        Exam:  BP 125/91  Pulse 75  Temp(Src) 98.5 F (36.9 C) (Oral)  Resp 16  SpO2 98% Gen: Well NAD HEENT: EOMI,  MMM, normal appearing tympanic membranes. Posterior pharynx is mildly erythematous. Lungs: Normal work of breathing. Prolonged expiratory phase with wheezing bilaterally Heart: RRR no MRG Abd: NABS, NT, ND Exts: Non edematous BL  LE, warm and well perfused.   Patient was given 0.5 mg and 5 mg Atrovent and albuterol nebulizer treatment. She had considerable improvement in symptoms and normalization of her pulmonary exam.  No results found for this or any previous visit (from the past 24 hour(s)). No results found.  Assessment and Plan: 37 y.o. female with COPD exacerbation. Plan to treat with prednisone, albuterol, azithromycin, and small amount of Norco for cough. Followup with primary care provider soon. Discussed warning signs or symptoms. Please see discharge instructions. Patient expresses understanding.      Rodolph Bong, MD 01/15/13 [phone]

## 2013-01-15 NOTE — ED Notes (Signed)
C/o coughing for three days.  Patient states cough is unproductive and is having her body to ache,.

## 2013-04-15 ENCOUNTER — Encounter (HOSPITAL_COMMUNITY): Payer: Self-pay | Admitting: Emergency Medicine

## 2013-04-15 ENCOUNTER — Emergency Department (HOSPITAL_COMMUNITY): Payer: Medicaid Other

## 2013-04-15 ENCOUNTER — Emergency Department (HOSPITAL_COMMUNITY)
Admission: EM | Admit: 2013-04-15 | Discharge: 2013-04-16 | Disposition: A | Discharge status: Home / Self Care | Payer: Medicaid Other | Attending: Emergency Medicine | Admitting: Emergency Medicine

## 2013-04-15 DIAGNOSIS — R109 Unspecified abdominal pain: Secondary | ICD-10-CM

## 2013-04-15 DIAGNOSIS — J45909 Unspecified asthma, uncomplicated: Secondary | ICD-10-CM | POA: Insufficient documentation

## 2013-04-15 DIAGNOSIS — R197 Diarrhea, unspecified: Secondary | ICD-10-CM | POA: Insufficient documentation

## 2013-04-15 DIAGNOSIS — Z3202 Encounter for pregnancy test, result negative: Secondary | ICD-10-CM | POA: Insufficient documentation

## 2013-04-15 DIAGNOSIS — Z87440 Personal history of urinary (tract) infections: Secondary | ICD-10-CM | POA: Insufficient documentation

## 2013-04-15 DIAGNOSIS — K7689 Other specified diseases of liver: Secondary | ICD-10-CM | POA: Insufficient documentation

## 2013-04-15 DIAGNOSIS — J4489 Other specified chronic obstructive pulmonary disease: Secondary | ICD-10-CM | POA: Insufficient documentation

## 2013-04-15 DIAGNOSIS — F411 Generalized anxiety disorder: Secondary | ICD-10-CM | POA: Insufficient documentation

## 2013-04-15 DIAGNOSIS — K5900 Constipation, unspecified: Secondary | ICD-10-CM

## 2013-04-15 DIAGNOSIS — R110 Nausea: Secondary | ICD-10-CM | POA: Insufficient documentation

## 2013-04-15 DIAGNOSIS — R141 Gas pain: Secondary | ICD-10-CM | POA: Insufficient documentation

## 2013-04-15 DIAGNOSIS — F3289 Other specified depressive episodes: Secondary | ICD-10-CM | POA: Insufficient documentation

## 2013-04-15 DIAGNOSIS — R945 Abnormal results of liver function studies: Secondary | ICD-10-CM

## 2013-04-15 DIAGNOSIS — R7989 Other specified abnormal findings of blood chemistry: Secondary | ICD-10-CM

## 2013-04-15 DIAGNOSIS — Z882 Allergy status to sulfonamides status: Secondary | ICD-10-CM | POA: Insufficient documentation

## 2013-04-15 DIAGNOSIS — Z87442 Personal history of urinary calculi: Secondary | ICD-10-CM | POA: Insufficient documentation

## 2013-04-15 DIAGNOSIS — R160 Hepatomegaly, not elsewhere classified: Secondary | ICD-10-CM | POA: Insufficient documentation

## 2013-04-15 DIAGNOSIS — J449 Chronic obstructive pulmonary disease, unspecified: Secondary | ICD-10-CM | POA: Insufficient documentation

## 2013-04-15 DIAGNOSIS — R350 Frequency of micturition: Secondary | ICD-10-CM | POA: Insufficient documentation

## 2013-04-15 DIAGNOSIS — R142 Eructation: Secondary | ICD-10-CM | POA: Insufficient documentation

## 2013-04-15 DIAGNOSIS — F329 Major depressive disorder, single episode, unspecified: Secondary | ICD-10-CM | POA: Insufficient documentation

## 2013-04-15 DIAGNOSIS — Z8679 Personal history of other diseases of the circulatory system: Secondary | ICD-10-CM | POA: Insufficient documentation

## 2013-04-15 DIAGNOSIS — Z79899 Other long term (current) drug therapy: Secondary | ICD-10-CM | POA: Insufficient documentation

## 2013-04-15 DIAGNOSIS — B1920 Unspecified viral hepatitis C without hepatic coma: Secondary | ICD-10-CM | POA: Insufficient documentation

## 2013-04-15 DIAGNOSIS — R000 Tachycardia, unspecified: Secondary | ICD-10-CM | POA: Insufficient documentation

## 2013-04-15 DIAGNOSIS — R143 Flatulence: Secondary | ICD-10-CM

## 2013-04-15 DIAGNOSIS — IMO0001 Reserved for inherently not codable concepts without codable children: Secondary | ICD-10-CM | POA: Insufficient documentation

## 2013-04-15 DIAGNOSIS — F17200 Nicotine dependence, unspecified, uncomplicated: Secondary | ICD-10-CM | POA: Insufficient documentation

## 2013-04-15 LAB — COMPREHENSIVE METABOLIC PANEL
ALBUMIN: 4.1 g/dL (ref 3.5–5.2)
ALT: 317 U/L — ABNORMAL HIGH (ref 0–35)
AST: 263 U/L — AB (ref 0–37)
Alkaline Phosphatase: 131 U/L — ABNORMAL HIGH (ref 39–117)
BILIRUBIN TOTAL: 0.4 mg/dL (ref 0.3–1.2)
BUN: 17 mg/dL (ref 6–23)
CHLORIDE: 100 meq/L (ref 96–112)
CO2: 25 mEq/L (ref 19–32)
Calcium: 9.3 mg/dL (ref 8.4–10.5)
Creatinine, Ser: 0.88 mg/dL (ref 0.50–1.10)
GFR calc Af Amer: >90 mL/min (ref >90)
GFR calc non Af Amer: 84 mL/min — ABNORMAL LOW (ref >90)
Glucose, Bld: 95 mg/dL (ref 70–99)
Potassium: 4.8 mEq/L (ref 3.7–5.3)
Sodium: 139 mEq/L (ref 137–147)
Total Protein: 8 g/dL (ref 6.0–8.3)

## 2013-04-15 LAB — URINE MICROSCOPIC-ADD ON

## 2013-04-15 LAB — CBC WITH DIFFERENTIAL/PLATELET
BASOS ABS: 0 10*3/uL (ref 0.0–0.1)
Basophils Relative: 1 % (ref 0–1)
EOS ABS: 0.2 10*3/uL (ref 0.0–0.7)
Eosinophils Relative: 4 % (ref 0–5)
HCT: 42 % (ref 36.0–46.0)
Hemoglobin: 14.7 g/dL (ref 12.0–15.0)
Lymphocytes Relative: 34 % (ref 12–46)
Lymphs Abs: 1.4 10*3/uL (ref 0.7–4.0)
MCH: 32.4 pg (ref 26.0–34.0)
MCHC: 35 g/dL (ref 30.0–36.0)
MCV: 92.5 fL (ref 78.0–100.0)
Monocytes Absolute: 0.4 10*3/uL (ref 0.1–1.0)
Monocytes Relative: 9 % (ref 3–12)
NEUTROS ABS: 2.2 10*3/uL (ref 1.7–7.7)
NEUTROS PCT: 53 % (ref 43–77)
Platelets: 267 10*3/uL (ref 150–400)
RBC: 4.54 MIL/uL (ref 3.87–5.11)
RDW: 12.5 % (ref 11.5–15.5)
WBC: 4.2 10*3/uL (ref 4.0–10.5)

## 2013-04-15 LAB — URINALYSIS, ROUTINE W REFLEX MICROSCOPIC
GLUCOSE, UA: NEGATIVE mg/dL
Hgb urine dipstick: NEGATIVE
KETONES UR: 15 mg/dL — AB
LEUKOCYTES UA: NEGATIVE
NITRITE: NEGATIVE
PH: 5.5 (ref 5.0–8.0)
Protein, ur: 30 mg/dL — AB
SPECIFIC GRAVITY, URINE: 1.035 — AB (ref 1.005–1.030)
Urobilinogen, UA: 1 mg/dL (ref 0.0–1.0)

## 2013-04-15 LAB — PREGNANCY, URINE: PREG TEST UR: NEGATIVE

## 2013-04-15 LAB — LIPASE, BLOOD: LIPASE: 37 U/L (ref 11–59)

## 2013-04-15 MED ORDER — HYDROMORPHONE HCL PF 1 MG/ML IJ SOLN
1.0000 mg | Freq: Once | INTRAMUSCULAR | Status: DC
  Filled 2013-04-15: qty 1

## 2013-04-15 MED ORDER — DOCUSATE SODIUM 100 MG PO CAPS: 100.0000 mg | ORAL_CAPSULE | Freq: Every day | ORAL | Status: DC

## 2013-04-15 MED ORDER — OXYCODONE HCL 5 MG PO TABS: 5.0000 mg | ORAL_TABLET | ORAL | Status: DC | PRN

## 2013-04-15 MED ORDER — SODIUM CHLORIDE 0.9 % IV SOLN
Freq: Once | INTRAVENOUS | Status: AC
  Administered 2013-04-15: 23:00:00 via INTRAVENOUS

## 2013-04-15 MED ORDER — ONDANSETRON 4 MG PO TBDP
4.0000 mg | ORAL_TABLET | Freq: Once | ORAL | Status: AC
  Administered 2013-04-15: 4 mg via ORAL
  Filled 2013-04-15: qty 1

## 2013-04-15 MED ORDER — OXYCODONE HCL 5 MG PO TABS: 5.0000 mg | ORAL_TABLET | Freq: Once | ORAL | Status: DC

## 2013-04-15 MED ORDER — OXYCODONE HCL 5 MG PO TABS
5.0000 mg | ORAL_TABLET | Freq: Once | ORAL | Status: AC
  Administered 2013-04-16: 5 mg via ORAL
  Filled 2013-04-15: qty 1

## 2013-04-15 NOTE — Discharge Instructions (Signed)
Today your CT scan, is normal, your ultrasound, is a normal.  There is no explanation for your flank pain, or elevated LFTs.  You are constipated, U. been given a prescription for Colace.  Please uses on a regular basis twice a day until you start having regular bowel movements, then daily.  Your alcohol use is weekends may have contributed to your elevated LFTs and abdominal pain.  Due to your underlying hepatitis C.  It is recommended that, you do not drink any alcohol at all

## 2013-04-15 NOTE — ED Notes (Signed)
Pt with hx of Hep C and Kidney stones to ED c/o R lower back pain that radiates to RLQ since yesterday and RUQ pain and abdominal swelling since this am (feels as if something is pushing up into my ribs).  Pt also noticed her stool was bright orange.

## 2013-04-15 NOTE — ED Notes (Signed)
Patient transported to CT 

## 2013-04-15 NOTE — ED Provider Notes (Signed)
CSN: [id number]     Arrival date & time 04/15/13  1808 History   First MD Initiated Contact with Patient 04/15/13 2059     Chief Complaint  Patient presents with  . Abdominal Pain  . Nausea   (Consider location/radiation/quality/duration/timing/severity/associated sxs/prior Treatment) Patient is a 38 y.o. female presenting with abdominal pain. The history is provided by the patient.  Abdominal Pain Pain location:  R flank and RLQ Pain quality: aching and fullness   Pain radiates to:  Suprapubic region Pain severity:  Moderate Onset quality:  Gradual Duration:  1 day Timing:  Constant Progression:  Worsening Chronicity:  New Context: alcohol use and eating   Context: not diet changes, not medication withdrawal, not sick contacts and not trauma   Relieved by:  Nothing Worsened by:  Eating Ineffective treatments:  NSAIDs Associated symptoms: diarrhea and nausea   Associated symptoms: no chest pain, no chills, no constipation, no dysuria, no fever, no shortness of breath and no vomiting   Diarrhea:    Diarrhea characteristics: Orange in color.   Severity:  Moderate   Duration:  1 day   Timing:  Intermittent   Progression:  Worsening Risk factors comment:  Chief kidney stands, and hepatitis C   Past Medical History  Diagnosis Date  . Asthma   . Emphysema   . COPD (chronic obstructive pulmonary disease)   . Fibromyalgia, primary   . Anxiety   . Depressed   . Nephrolithiasis   . UTI (lower urinary tract infection)   . Kidney stone   . Hepatitis C   . Dysrhythmia   . Fibromyalgia    Past Surgical History  Procedure Laterality Date  . Abdominal hysterectomy    . Orif ankle fracture  02/03/2012    Procedure: OPEN REDUCTION INTERNAL FIXATION (ORIF) ANKLE FRACTURE;  Surgeon: Nadara Mustard, MD;  Location: [facility] OR;  Service: Orthopedics;  Laterality: Right;  Open Reduction Internal Fixation Right Ankle  . Tubal ligation    . Wisdom tooth extraction    . Orif clavicular  fracture Left 08/29/2012    Dr Ophelia Charter  . Orif clavicular fracture Left 08/29/2012    Procedure: OPEN REDUCTION INTERNAL FIXATION (ORIF) CLAVICULAR FRACTURE;  Surgeon: Eldred Manges, MD;  Location: [facility] OR;  Service: Orthopedics;  Laterality: Left;  Open Reduction Internal Fixation Left Clavicle Fracture   Family History  Problem Relation Age of Onset  . Alcohol abuse Father   . Cirrhosis Father   . Cancer Father   . Hyperthyroidism Sister   . Bipolar disorder Sister   . Kidney disease Brother   . Heart attack Mother   . Hypertension Mother    History  Substance Use Topics  . Smoking status: Current Every Day Smoker -- 0.40 packs/day for 10 years    Types: Cigarettes  . Smokeless tobacco: Never Used  . Alcohol Use: Yes     Comment: occasionally   OB History   Grav Para Term Preterm Abortions TAB SAB Ect Mult Living                 Obstetric Comments   S/p hysterectomy     Review of Systems  Constitutional: Negative for fever and chills.  Respiratory: Negative for shortness of breath.   Cardiovascular: Negative for chest pain.  Gastrointestinal: Positive for nausea, abdominal pain and diarrhea. Negative for vomiting, constipation, blood in stool and rectal pain.  Genitourinary: Positive for frequency. Negative for dysuria.  Skin: Negative for rash.  Neurological: Negative  for dizziness.  All other systems reviewed and are negative.    Allergies  Eggs or egg-derived products; Milk-related compounds; and Sulfa antibiotics  Home Medications   Current Outpatient Rx  Name  Route  Sig  Dispense  Refill  . albuterol (PROVENTIL HFA;VENTOLIN HFA) 108 (90 BASE) MCG/ACT inhaler   Inhalation   Inhale 2 puffs into the lungs every 4 (four) hours as needed for wheezing.   1 Inhaler   0   . CALCIUM PO   Oral   Take 1 tablet by mouth daily.         . fish oil-omega-3 fatty acids 1000 MG capsule   Oral   Take 1 g by mouth 2 (two) times daily.         . Multiple Vitamin  (MULTIVITAMIN WITH MINERALS) TABS   Oral   Take 1 tablet by mouth daily.         . vitamin B-12 (CYANOCOBALAMIN) 1000 MCG tablet   Oral   Take 1,000 mcg by mouth daily.         Marland Kitchen docusate sodium (COLACE) 100 MG capsule   Oral   Take 1 capsule (100 mg total) by mouth daily.   30 capsule   0   . oxyCODONE (ROXICODONE) 5 MG immediate release tablet   Oral   Take 1 tablet (5 mg total) by mouth every 4 (four) hours as needed for severe pain.   12 tablet   0    BP 138/120  Pulse 84  Temp(Src) 98.8 F (37.1 C) (Oral)  Resp 16  Ht 5\' 6"  (1.676 m)  Wt 159 lb (72.122 kg)  BMI 25.68 kg/m2  SpO2 98% Physical Exam  Nursing note and vitals reviewed. Constitutional: She is oriented to person, place, and time. She appears well-developed and well-nourished.  HENT:  Head: Normocephalic.  Mouth/Throat: Oropharynx is clear and moist.  Eyes: Pupils are equal, round, and reactive to light.  Neck: Normal range of motion.  Cardiovascular: Regular rhythm.  Tachycardia present.   Pulmonary/Chest: Effort normal and breath sounds normal. She exhibits no tenderness.  Abdominal: Soft. She exhibits distension. There is no hepatosplenomegaly. There is tenderness in the right upper quadrant, right lower quadrant and suprapubic area. There is no rebound and no guarding.  Musculoskeletal: Normal range of motion.  Neurological: She is alert and oriented to person, place, and time.  Skin: Skin is warm. No rash noted.    ED Course  Procedures (including critical care time) Labs Review Labs Reviewed  COMPREHENSIVE METABOLIC PANEL - Abnormal; Notable for the following:    AST 263 (*)    ALT 317 (*)    Alkaline Phosphatase 131 (*)    GFR calc non Af Amer 84 (*)    All other components within normal limits  URINALYSIS, ROUTINE W REFLEX MICROSCOPIC - Abnormal; Notable for the following:    Color, Urine AMBER (*)    APPearance CLOUDY (*)    Specific Gravity, Urine 1.035 (*)    Bilirubin Urine  SMALL (*)    Ketones, ur 15 (*)    Protein, ur 30 (*)    All other components within normal limits  URINE MICROSCOPIC-ADD ON - Abnormal; Notable for the following:    Squamous Epithelial / LPF FEW (*)    Casts HYALINE CASTS (*)    All other components within normal limits  CBC WITH DIFFERENTIAL  LIPASE, BLOOD  PREGNANCY, URINE   Imaging Review Ct Abdomen Pelvis Wo Contrast  04/15/2013  CLINICAL DATA:  Hepatitis-C with kidney stones and right low back pain.  EXAM: CT ABDOMEN AND PELVIS WITHOUT CONTRAST  TECHNIQUE: Multidetector CT imaging of the abdomen and pelvis was performed following the standard protocol without intravenous contrast.  COMPARISON:  CT ABD/PELVIS W CM dated 04/26/2011  FINDINGS: Lower Chest: Clear lung bases. Normal heart size without pericardial or pleural effusion.  Abdomen/Pelvis: Mild hepatic steatosis and hepatomegaly, without focal liver lesion. Normal spleen, stomach. Descending duodenal diverticulum. Normal pancreas, gallbladder, biliary tract, adrenal glands.  No renal calculi or hydronephrosis. No hydroureter or ureteric calculi. No retroperitoneal or retrocrural adenopathy. Colonic stool burden suggests constipation. Normal terminal ileum and appendix. Normal small bowel without abdominal ascites. No pelvic adenopathy. Normal urinary bladder. Hysterectomy. No adnexal mass. No significant free fluid. Probable phleboliths in the pelvis.  Bones/Musculoskeletal: Lucencies in the right transverse process of L4 and L1 are likely new since the prior exam. Degenerative disc disease at L4-5 and L5-S1.  IMPRESSION: 1.  No acute process in the abdomen or pelvis. 2. Right-sided transverse process fractures which are new since 04/26/2011. Correlate with point tenderness. Favor nonacute. 3. Hepatic steatosis and hepatomegaly. 4.  Possible constipation.   Electronically Signed   By: Jeronimo GreavesKyle  Talbot M.D.   On: 04/15/2013 22:34   [place] Abdomen Complete  04/15/2013   CLINICAL DATA:  Abdominal  pain and nausea. Elevated liver function tests.  EXAM: ULTRASOUND ABDOMEN COMPLETE  COMPARISON:  CT ABD/PELV WO CM dated 04/15/2013; US ABDOMEN COMPLETE dated 02/27/2012  FINDINGS: Gallbladder:  No gallstones or wall thickening visualized. No sonographic Murphy sign noted.  Common bile duct:  Diameter: Normal, 5 mm  Liver:  No focal lesion identified. Within normal limits in parenchymal echogenicity.  IVC:  No abnormality visualized.  Pancreas:  Visualized portion unremarkable.  Spleen:  Size and appearance within normal limits.  Right Kidney:  Length: 12.0. Echogenicity within normal limits. No mass or hydronephrosis visualized.  Left Kidney:  Length: 12.8. Echogenicity within normal limits. No mass or hydronephrosis visualized.  Abdominal aorta:  No aneurysm visualized.  Other findings:  None.  IMPRESSION: Normal abdominal ultrasound.  No explanation for abdominal pain.   Electronically Signed   By: Jeronimo GreavesKyle  Talbot M.D.   On: 04/15/2013 23:09    EKG Interpretation   None       MDM   1. Flank pain   2. Elevated LFTs   3. Constipation     Irving Burtonmily member pulled me aside stating, that over the weekend.  The patient was drinking heavily, even the patient states she had 3 or 4 beers through the whole weekend    Arman FilterGail K Athziri Freundlich, NP 04/15/13 2327

## 2013-04-16 NOTE — ED Provider Notes (Signed)
Medical screening examination/treatment/procedure(s) were performed by non-physician practitioner and as supervising physician I was immediately available for consultation/collaboration.  EKG Interpretation   None        Geoffery Lyonsouglas Aurelius Gildersleeve, MD 04/16/13 [phone]

## 2013-06-05 ENCOUNTER — Emergency Department (INDEPENDENT_AMBULATORY_CARE_PROVIDER_SITE_OTHER)
Admission: EM | Admit: 2013-06-05 | Discharge: 2013-06-05 | Disposition: A | Discharge status: Home / Self Care | Payer: Self-pay | Source: Home / Self Care | Attending: Family Medicine | Admitting: Family Medicine

## 2013-06-05 ENCOUNTER — Emergency Department (INDEPENDENT_AMBULATORY_CARE_PROVIDER_SITE_OTHER): Payer: Self-pay

## 2013-06-05 ENCOUNTER — Encounter (HOSPITAL_COMMUNITY): Payer: Self-pay | Admitting: Emergency Medicine

## 2013-06-05 DIAGNOSIS — J441 Chronic obstructive pulmonary disease with (acute) exacerbation: Secondary | ICD-10-CM

## 2013-06-05 MED ORDER — PREDNISONE 50 MG PO TABS: 50.0000 mg | ORAL_TABLET | Freq: Every day | ORAL | Status: DC

## 2013-06-05 MED ORDER — AZITHROMYCIN 250 MG PO TABS: ORAL_TABLET | ORAL | Status: DC

## 2013-06-05 MED ORDER — HYDROCOD POLST-CHLORPHEN POLST 10-8 MG/5ML PO LQCR: 5.0000 mL | Freq: Two times a day (BID) | ORAL | Status: DC | PRN

## 2013-06-05 NOTE — ED Provider Notes (Signed)
CSN: [id number]     Arrival date & time 06/05/13  1337 History   First MD Initiated Contact with Patient 06/05/13 1446     Chief Complaint  Patient presents with  . Cough   (Consider location/radiation/quality/duration/timing/severity/associated sxs/prior Treatment) HPI Comments: 38 year old female with a history of COPD and hepatitis C presents for evaluation of cough, burning in her chest, subjective fever and chills, body aches, runny nose, sore throat, headache. Her symptoms initially began 2 weeks ago with a dry cough and burning in her chest. She was taking over-the-counter medications but they were not helping. Her symptoms have progressed, and she started having what she describes as flulike symptoms a couple days ago. She continues to take over-the-counter medications but says they are not helping. Denies measured fever, chest pain, shortness of breath, NVD, leg swelling, recent travel, sick contacts.  Patient is a 38 y.o. female presenting with cough.  Cough Associated symptoms: chills, fever, rhinorrhea, shortness of breath and sore throat   Associated symptoms: no chest pain, no ear pain, no myalgias, no rash and no wheezing     Past Medical History  Diagnosis Date  . Asthma   . Emphysema   . COPD (chronic obstructive pulmonary disease)   . Fibromyalgia, primary   . Anxiety   . Depressed   . Nephrolithiasis   . UTI (lower urinary tract infection)   . Kidney stone   . Hepatitis C   . Dysrhythmia   . Fibromyalgia    Past Surgical History  Procedure Laterality Date  . Abdominal hysterectomy    . Orif ankle fracture  02/03/2012    Procedure: OPEN REDUCTION INTERNAL FIXATION (ORIF) ANKLE FRACTURE;  Surgeon: Nadara Mustard, MD;  Location: [facility] OR;  Service: Orthopedics;  Laterality: Right;  Open Reduction Internal Fixation Right Ankle  . Tubal ligation    . Wisdom tooth extraction    . Orif clavicular fracture Left 08/29/2012    Dr Ophelia Charter  . Orif clavicular fracture Left  08/29/2012    Procedure: OPEN REDUCTION INTERNAL FIXATION (ORIF) CLAVICULAR FRACTURE;  Surgeon: Eldred Manges, MD;  Location: [facility] OR;  Service: Orthopedics;  Laterality: Left;  Open Reduction Internal Fixation Left Clavicle Fracture   Family History  Problem Relation Age of Onset  . Alcohol abuse Father   . Cirrhosis Father   . Cancer Father   . Hyperthyroidism Sister   . Bipolar disorder Sister   . Kidney disease Brother   . Heart attack Mother   . Hypertension Mother    History  Substance Use Topics  . Smoking status: Current Every Day Smoker -- 0.40 packs/day for 10 years    Types: Cigarettes  . Smokeless tobacco: Never Used  . Alcohol Use: Yes     Comment: occasionally   OB History   Grav Para Term Preterm Abortions TAB SAB Ect Mult Living                 Obstetric Comments   S/p hysterectomy     Review of Systems  Constitutional: Positive for fever, chills and fatigue.  HENT: Positive for congestion, postnasal drip, rhinorrhea, sinus pressure and sore throat. Negative for ear pain.   Eyes: Negative for visual disturbance.  Respiratory: Positive for cough, chest tightness and shortness of breath. Negative for wheezing.   Cardiovascular: Negative for chest pain, palpitations and leg swelling.  Gastrointestinal: Negative for nausea, vomiting and abdominal pain.  Endocrine: Negative for polydipsia and polyuria.  Genitourinary: Negative for dysuria, urgency  and frequency.  Musculoskeletal: Negative for arthralgias and myalgias.  Skin: Negative for rash.  Neurological: Negative for dizziness, weakness and light-headedness.    Allergies  Eggs or egg-derived products; Milk-related compounds; and Sulfa antibiotics  Home Medications   Current Outpatient Rx  Name  Route  Sig  Dispense  Refill  . albuterol (PROVENTIL HFA;VENTOLIN HFA) 108 (90 BASE) MCG/ACT inhaler   Inhalation   Inhale 2 puffs into the lungs every 4 (four) hours as needed for wheezing.   1 Inhaler   0    . azithromycin (ZITHROMAX Z-PAK) 250 MG tablet      Use as directed   6 each   0   . CALCIUM PO   Oral   Take 1 tablet by mouth daily.         . chlorpheniramine-HYDROcodone (TUSSIONEX PENNKINETIC ER) 10-8 MG/5ML LQCR   Oral   Take 5 mLs by mouth every 12 (twelve) hours as needed for cough.   115 mL   0   . docusate sodium (COLACE) 100 MG capsule   Oral   Take 1 capsule (100 mg total) by mouth daily.   30 capsule   0   . fish oil-omega-3 fatty acids 1000 MG capsule   Oral   Take 1 g by mouth 2 (two) times daily.         . Multiple Vitamin (MULTIVITAMIN WITH MINERALS) TABS   Oral   Take 1 tablet by mouth daily.         Marland Kitchen. oxyCODONE (ROXICODONE) 5 MG immediate release tablet   Oral   Take 1 tablet (5 mg total) by mouth every 4 (four) hours as needed for severe pain.   12 tablet   0   . predniSONE (DELTASONE) 50 MG tablet   Oral   Take 1 tablet (50 mg total) by mouth daily with breakfast.   5 tablet   0   . vitamin B-12 (CYANOCOBALAMIN) 1000 MCG tablet   Oral   Take 1,000 mcg by mouth daily.          BP 133/93  Pulse 91  Temp(Src) 98.6 F (37 C) (Oral)  Resp 18  SpO2 100% Physical Exam  Nursing note and vitals reviewed. Constitutional: She is oriented to person, place, and time. Vital signs are normal. She appears well-developed and well-nourished. No distress.  HENT:  Head: Normocephalic and atraumatic.  Left Ear: External ear normal.  Nose: Nose normal.  Mouth/Throat: Oropharynx is clear and moist. No oropharyngeal exudate.  Eyes: Conjunctivae are normal. Right eye exhibits no discharge. Left eye exhibits no discharge.  Neck: Normal range of motion. Neck supple. No JVD present.  Cardiovascular: Normal rate, regular rhythm and normal heart sounds.  Exam reveals no gallop and no friction rub.   No murmur heard. Pulmonary/Chest: Effort normal. No respiratory distress. She has no wheezes. She has no rales.  Coarse breath sounds bilaterally without  wheezes or rales or rhonchi  Lymphadenopathy:    She has no cervical adenopathy.  Neurological: She is alert and oriented to person, place, and time. She has normal strength. Coordination normal.  Skin: Skin is warm and dry. No rash noted. She is not diaphoretic.  Psychiatric: She has a normal mood and affect. Judgment normal.    ED Course  Procedures (including critical care time) Labs Review Labs Reviewed - No data to display Imaging Review Dg Chest 2 View  06/05/2013   CLINICAL DATA Current smoker presenting with 2 week history of  cough, shortness of breath when supine, generalized body aches, and low-grade fever.  EXAM CHEST  2 VIEW  COMPARISON Two-view chest x-ray 08/29/2012, 10/02/2008.  FINDINGS Cardiomediastinal silhouette unremarkable and unchanged. Emphysematous changes in the upper lobes, left greater than right, unchanged. Mildly prominent bronchovascular markings diffusely and mild central peribronchial thickening, unchanged. Lungs otherwise clear. No localized airspace consolidation. No pleural effusions. No pneumothorax. Normal pulmonary vascularity. Prior ORIF of a left clavicle fracture with healing. Visualized bony thorax otherwise intact.  IMPRESSION Stable COPD/emphysema, including chronic bronchitis. No acute cardiopulmonary disease.  SIGNATURE  Electronically Signed   By: Hulan Saas M.D.   On: 06/05/2013 15:25     MDM   1. COPD exacerbation    Acute exacerbation of COPD, treated with prednisone, Zithromax, cough suppressant. She has plenty of her rescue inhaler at home. She will followup with her primary care physician on Monday in 5 days. She will go to the emergency department if she worsens despite treatment   Meds ordered this encounter  Medications  . azithromycin (ZITHROMAX Z-PAK) 250 MG tablet    Sig: Use as directed    Dispense:  6 each    Refill:  0    Order Specific Question:  Supervising Provider    Answer:  Clementeen Graham, S [[id number]]  . predniSONE  (DELTASONE) 50 MG tablet    Sig: Take 1 tablet (50 mg total) by mouth daily with breakfast.    Dispense:  5 tablet    Refill:  0    Order Specific Question:  Supervising Provider    Answer:  Clementeen Graham, S [[id number]]  . chlorpheniramine-HYDROcodone (TUSSIONEX PENNKINETIC ER) 10-8 MG/5ML LQCR    Sig: Take 5 mLs by mouth every 12 (twelve) hours as needed for cough.    Dispense:  115 mL    Refill:  0    Order Specific Question:  Supervising Provider    Answer:  Clementeen Graham, Kathie Rhodes [[id number]]       Graylon Good, PA-C 06/05/13 [phone]

## 2013-06-05 NOTE — Discharge Instructions (Signed)
Chronic Obstructive Pulmonary Disease Exacerbation °Chronic obstructive pulmonary disease (COPD) is a common lung condition in which airflow from the lungs is limited. COPD is a general term that can be used to describe many different lung problems that limit airflow, including chronic bronchitis and emphysema. COPD exacerbations are episodes when breathing symptoms become much worse and require extra treatment. Without treatment, COPD exacerbations can be life threatening, and frequent COPD exacerbations can cause further damage to your lungs. °CAUSES  °· Respiratory infections.   °· Exposure to smoke.   °· Exposure to air pollution, chemical fumes, or dust. °Sometimes there is no apparent cause or trigger. °RISK FACTORS °· Smoking cigarettes. °· Older age. °· Frequent prior COPD exacerbations. °SIGNS AND SYMPTOMS  °· Increased coughing.   °· Increased thick spit (sputum) production.   °· Increased wheezing.   °· Increased shortness of breath.   °· Rapid breathing.   °· Chest tightness. °DIAGNOSIS  °Your medical history, a physical exam, and tests will help your health care provider make a diagnosis. Tests may include: °· A chest X-ray. °· Basic lab tests. °· Sputum testing. °· An arterial blood gas test. °TREATMENT  °Depending on the severity of your COPD exacerbation, you may need to be admitted to a hospital for treatment. Some of the treatments commonly used to treat COPD exacerbations are:  °· Antibiotic medicines.   °· Bronchodilators. These are drugs that expand the air passages. They may be given with an inhaler or nebulizer. Spacer devices may be needed to help improve drug delivery. °· Corticosteroid medicines. °· Supplemental oxygen therapy.   °HOME CARE INSTRUCTIONS  °· Do not smoke. Quitting smoking is very important to prevent COPD from getting worse and exacerbations from happening as often. °· Avoid exposure to all substances that irritate the airway, especially to tobacco smoke.   °· If prescribed,  take your antibiotics as directed. Finish them even if you start to feel better. °· Only take over-the-counter or prescription medicines as directed by your health care provider. It is important to use correct technique with inhaled medicines. °· Drink enough fluids to keep your urine clear or pale yellow (unless you have a medical condition that requires fluid restriction). °· Use a cool mist vaporizer. This makes it easier to clear your chest when you cough.   °· If you have a home nebulizer and oxygen, continue to use them as directed.   °· Maintain all necessary vaccinations to prevent infections.   °· Exercise regularly.   °· Eat a healthy diet.   °· Keep all follow-up appointments as directed by your health care provider. °SEEK IMMEDIATE MEDICAL CARE IF: °· You have worsening shortness of breath.   °· You have trouble talking.   °· You have severe chest pain. °· You have blood in your sputum.  °· You have a fever. °· You have weakness, vomit repeatedly, or faint.   °· You feel confused.   °· You continue to get worse. °MAKE SURE YOU:  °· Understand these instructions. °· Will watch your condition. °· Will get help right away if you are not doing well or get worse. °Document Released: 01/09/2007 Document Revised: 01/02/2013 Document Reviewed: 11/16/2012 °[employer]® Patient Information ©2014 [employer]. ° °

## 2013-06-05 NOTE — ED Provider Notes (Signed)
Medical screening examination/treatment/procedure(s) were performed by a resident physician or non-physician practitioner and as the supervising physician I was immediately available for consultation/collaboration.  Elga Santy, MD    Mailani Degroote S Addiel Mccardle, MD 06/05/13 2121 

## 2013-06-05 NOTE — ED Notes (Signed)
C/o persistent nonproductive cough.  Burning in chest.  Hot/cold chills.  Runny nose.  And headache.  Symptoms present x 2 wks.  Hx of COPD, asthma, bronchitis, and emphysema.   No relief with otc meds.  Denies vomiting and diarrhea

## 2013-07-03 IMAGING — CR DG FOOT COMPLETE 3+V*R*
3 series · 3 of 3 positions shown · non-contrast
Comparison: 09/18/2009

CLINICAL DATA: Trauma.  Fall.  Pain.  Lateral foot pain.

RIGHT FOOT COMPLETE - 3+ VIEW

[t foot ap right]
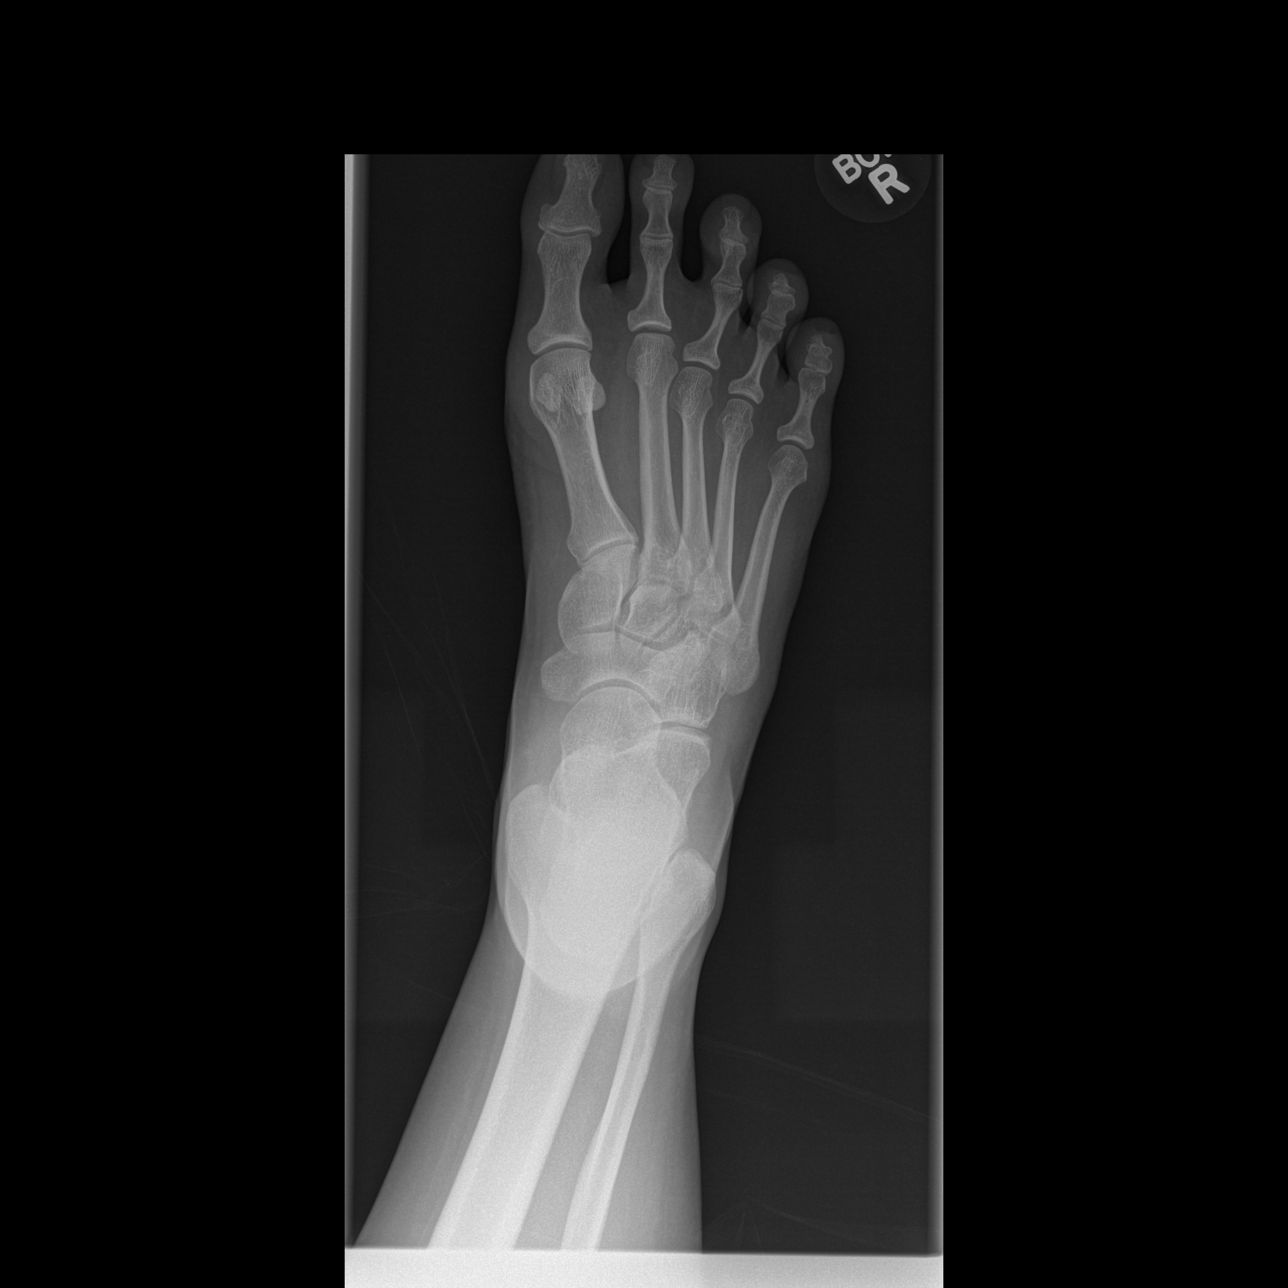

[t foot oblique right]
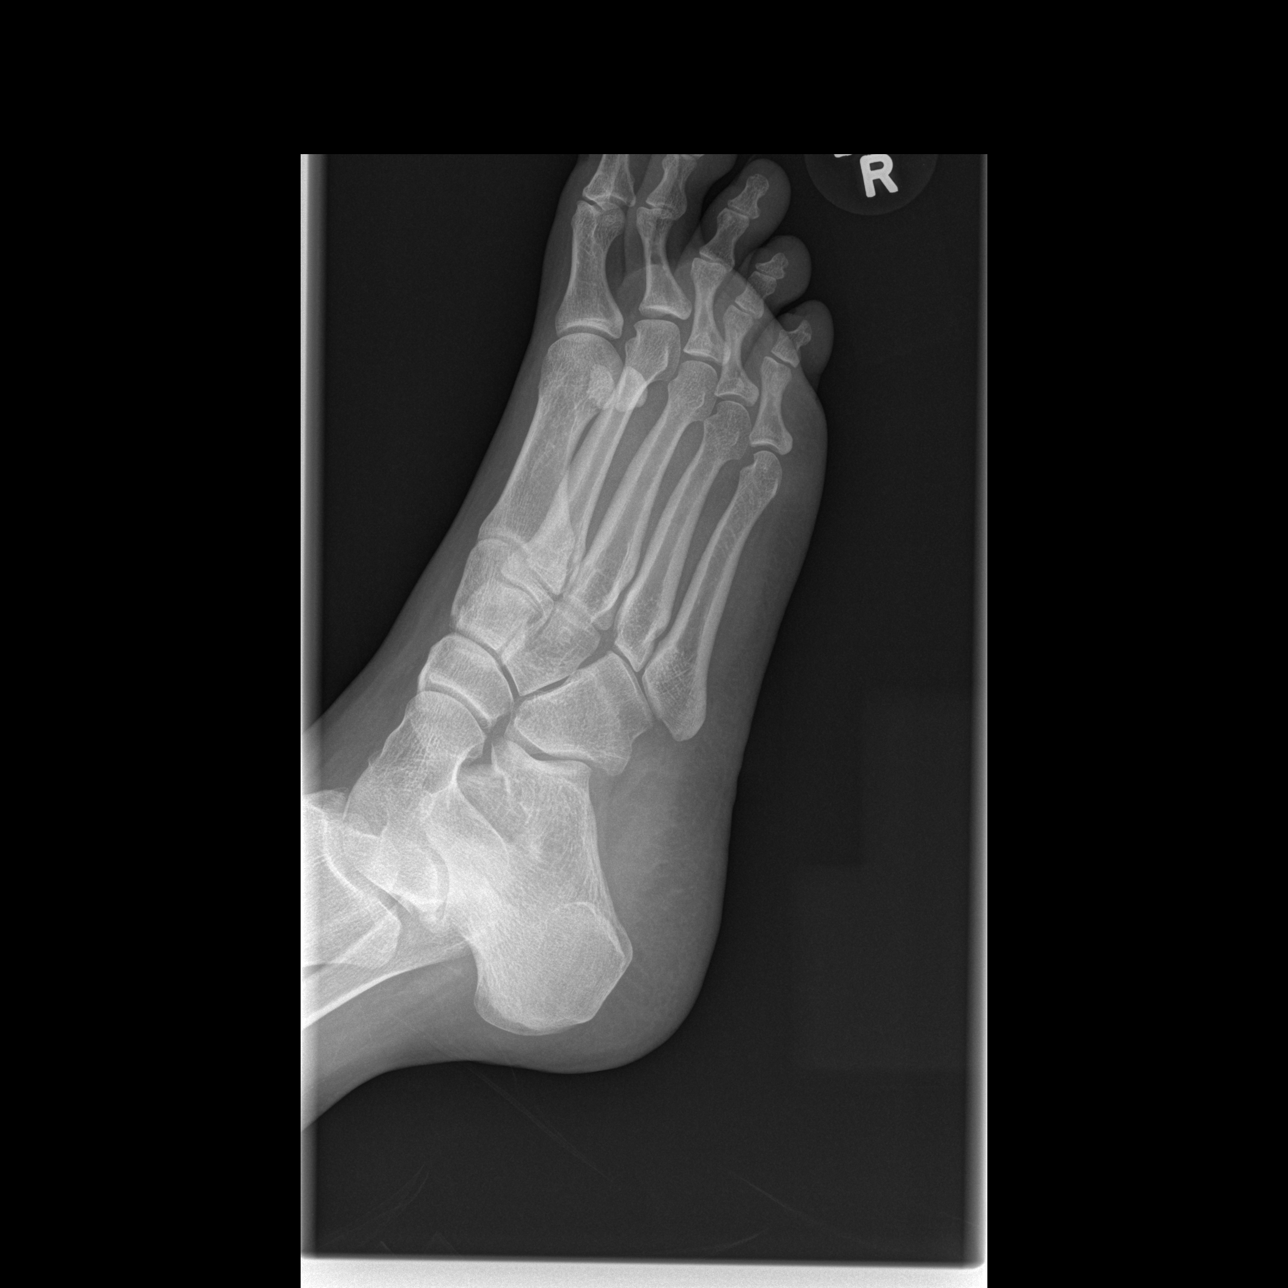

[t foot lat right]
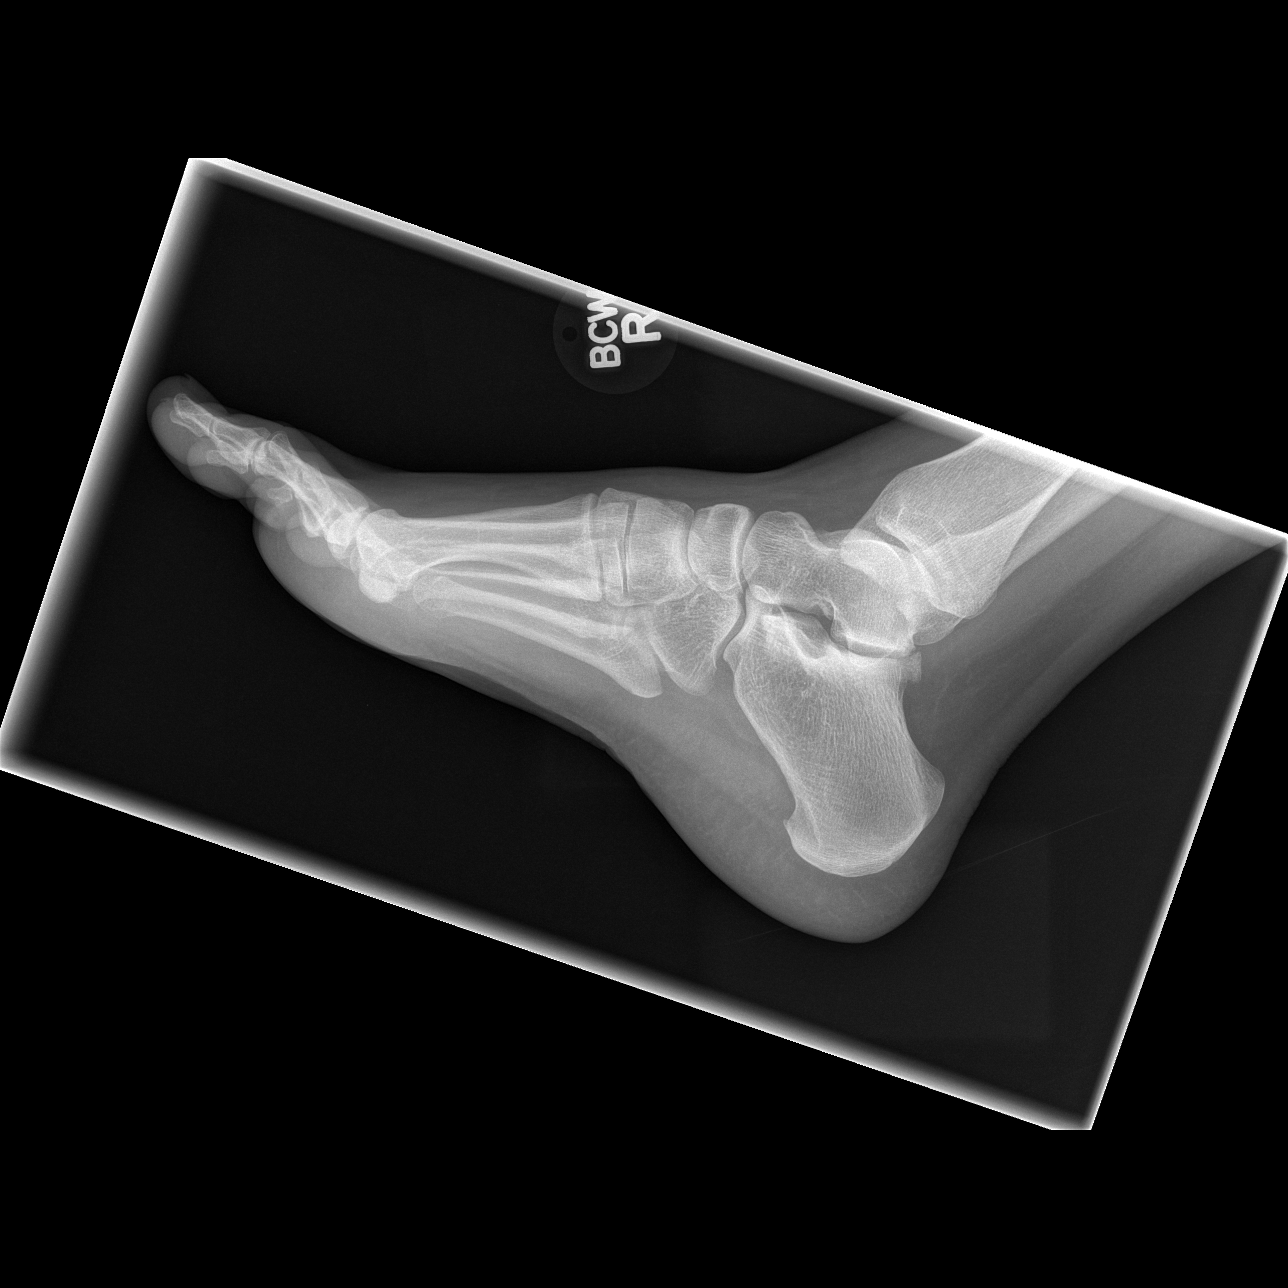

[3 of 3 positions shown; findings below may reference images not displayed]

FINDINGS: There is no evidence for acute fracture or dislocation.
No soft tissue foreign body or gas identified.
IMPRESSION: Negative exam.

## 2013-07-03 IMAGING — CR DG LUMBAR SPINE COMPLETE 4+V
5 series · 5 of 5 positions shown · non-contrast
Comparison: CT 05/12/2010

CLINICAL DATA: Trauma.  Fall.  Pain.  Lower back injury.  Buttock
and lower back pain.

LUMBAR SPINE - COMPLETE 4+ VIEW

[t l-spine a.p.]
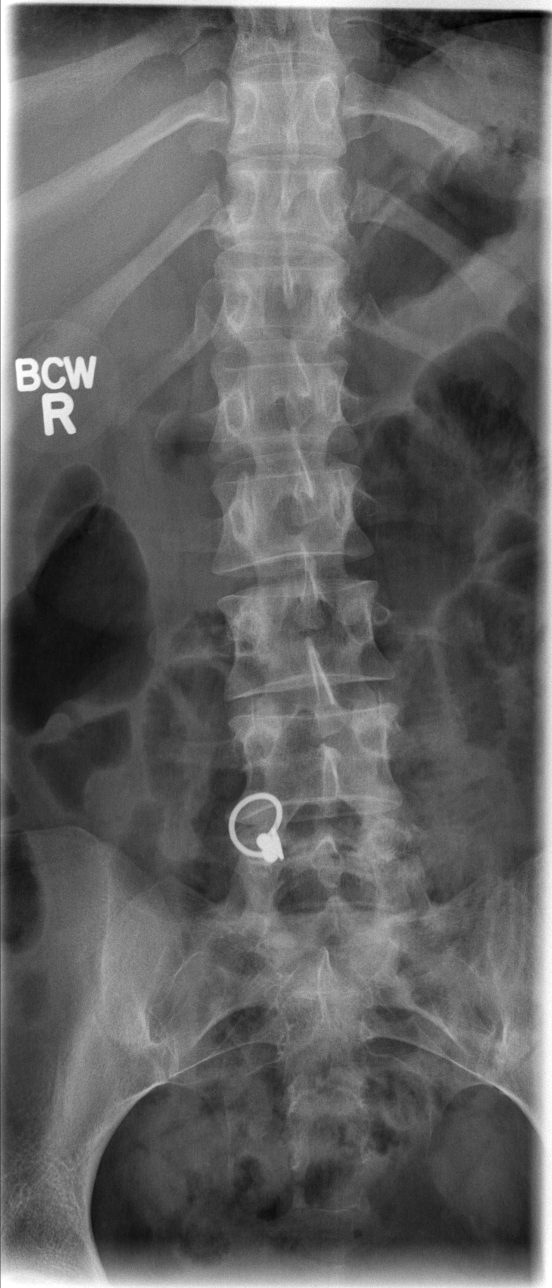

[t l-spine oblique exposure (1 of 2)]
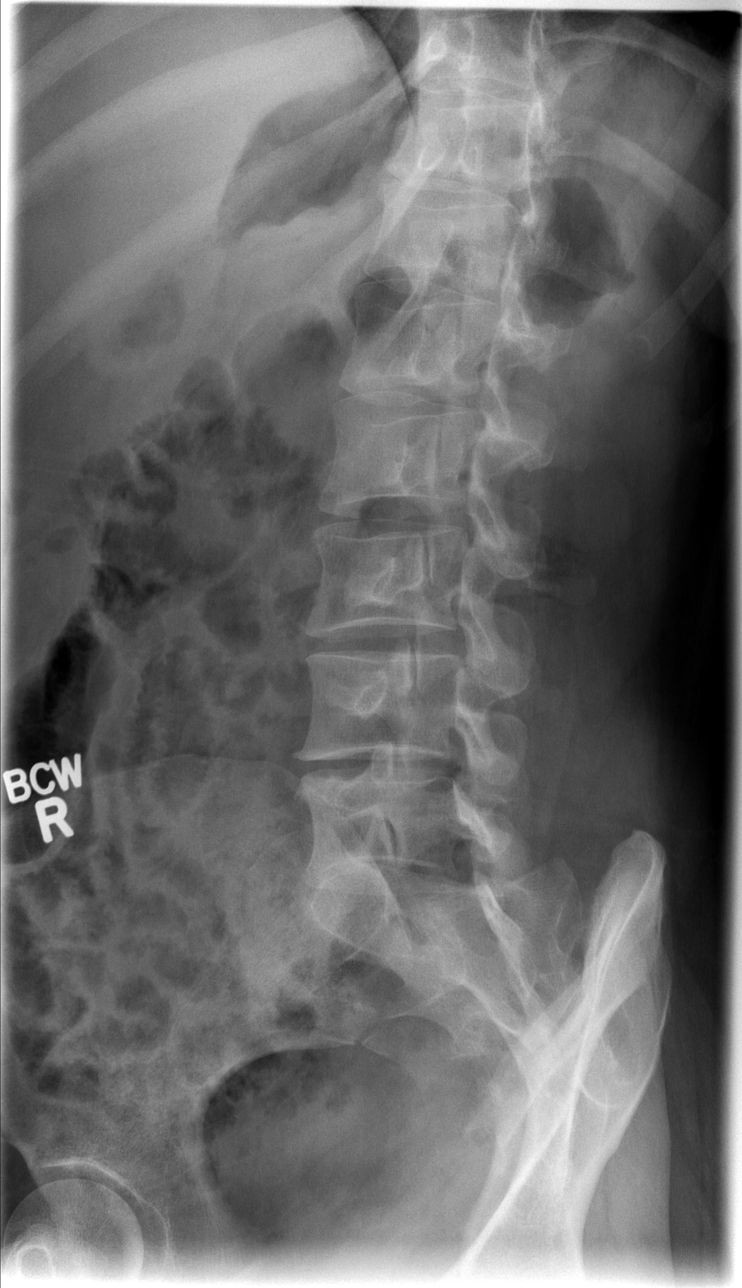

[t l-spine oblique exposure (2 of 2)]
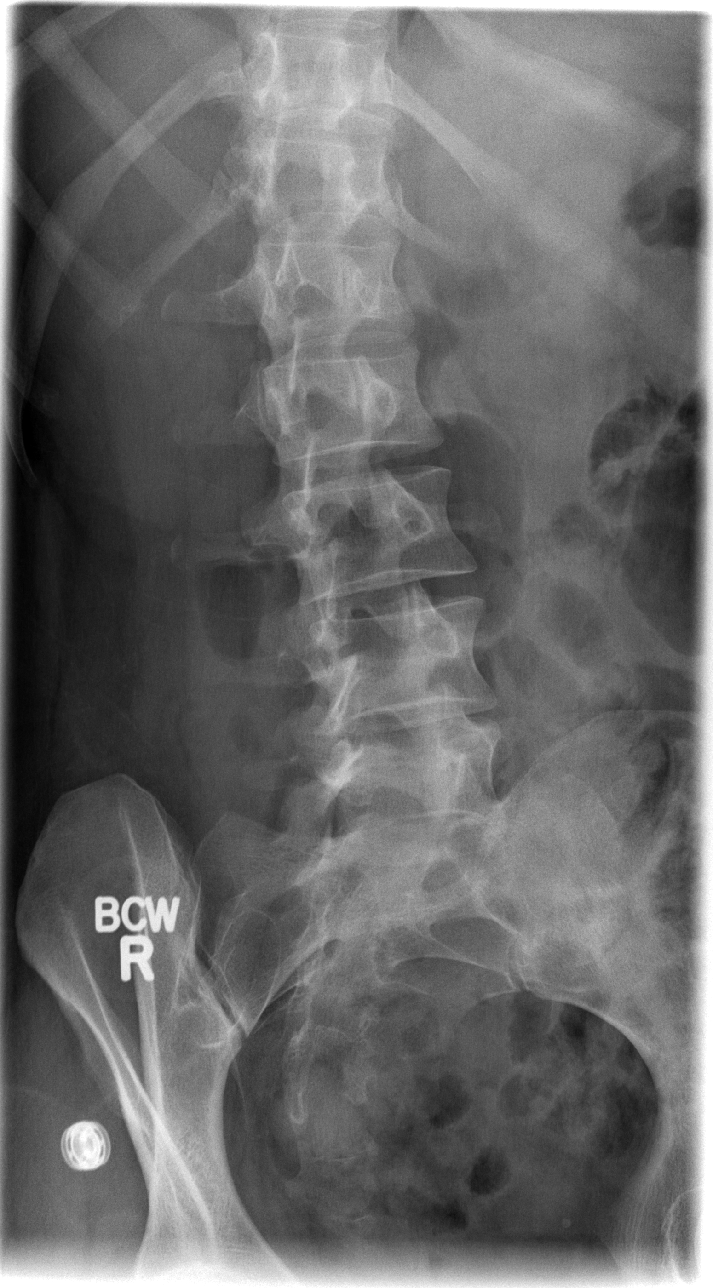

[t l-spine lat]
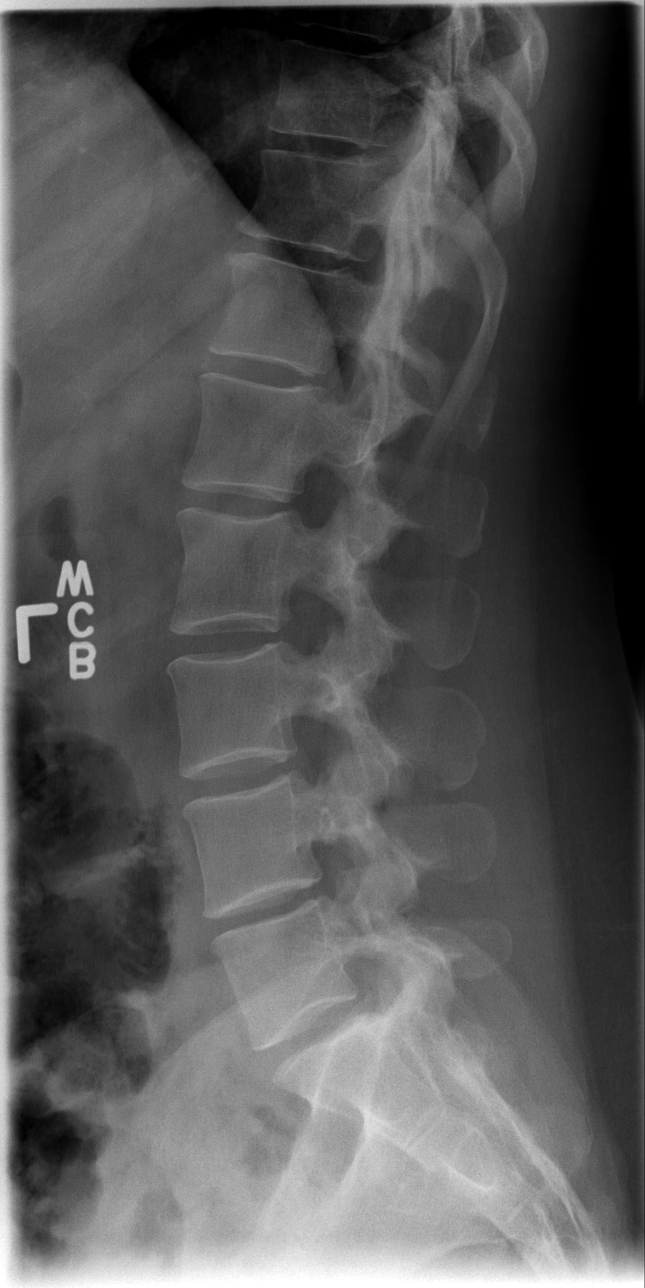

[t l-spine l5-s1 spot]
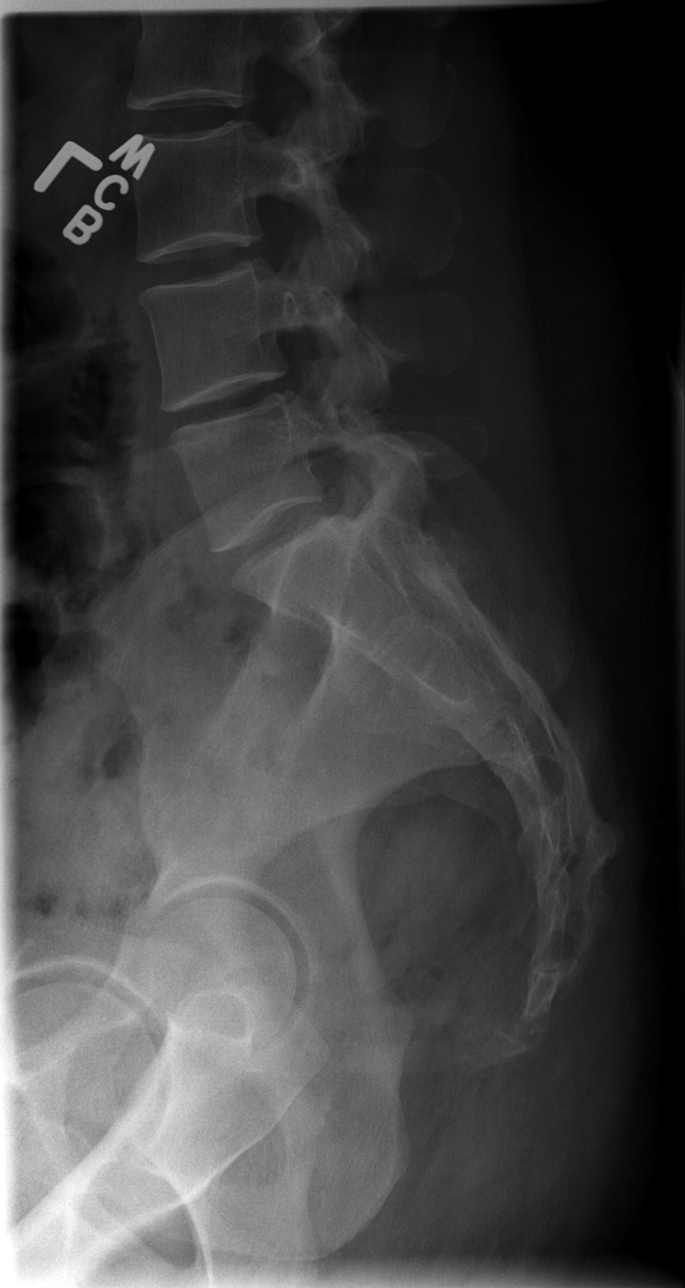

[5 of 5 positions shown; findings below may reference images not displayed]

FINDINGS: There are five non-rib bearing vertebral bodies.  There
is normal alignment.  There is no evidence for acute fracture or
subluxation.  No spondylolysis or spondylolisthesis identified.
Mild degenerate changes are seen at T11-12. The visualized portion
of the pelvis has a normal appearance.  Visualized bowel gas
pattern is nonobstructive.
IMPRESSION: 1. No evidence for acute  abnormality.
2.  Mild degenerative changes.

## 2013-07-12 ENCOUNTER — Ambulatory Visit: Payer: Self-pay | Admitting: Family Medicine
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
## 2013-09-23 ENCOUNTER — Encounter (HOSPITAL_COMMUNITY): Payer: Self-pay | Admitting: Emergency Medicine

## 2013-09-23 ENCOUNTER — Emergency Department (INDEPENDENT_AMBULATORY_CARE_PROVIDER_SITE_OTHER): Payer: Medicaid Other

## 2013-09-23 ENCOUNTER — Emergency Department (HOSPITAL_COMMUNITY)
Admission: EM | Admit: 2013-09-23 | Discharge: 2013-09-23 | Disposition: A | Discharge status: Home / Self Care | Payer: Medicaid Other | Source: Home / Self Care | Attending: Family Medicine | Admitting: Family Medicine

## 2013-09-23 DIAGNOSIS — J069 Acute upper respiratory infection, unspecified: Secondary | ICD-10-CM

## 2013-09-23 DIAGNOSIS — J9801 Acute bronchospasm: Secondary | ICD-10-CM

## 2013-09-23 MED ORDER — IPRATROPIUM BROMIDE 0.02 % IN SOLN
0.5000 mg | Freq: Once | RESPIRATORY_TRACT | Status: AC
  Administered 2013-09-23: 0.5 mg via RESPIRATORY_TRACT

## 2013-09-23 MED ORDER — IPRATROPIUM-ALBUTEROL 0.5-2.5 (3) MG/3ML IN SOLN
RESPIRATORY_TRACT | Status: AC
  Filled 2013-09-23: qty 3

## 2013-09-23 MED ORDER — PREDNISONE 10 MG PO TABS: ORAL_TABLET | ORAL | Status: DC

## 2013-09-23 MED ORDER — ALBUTEROL SULFATE (2.5 MG/3ML) 0.083% IN NEBU
5.0000 mg | INHALATION_SOLUTION | Freq: Once | RESPIRATORY_TRACT | Status: AC
  Administered 2013-09-23: 5 mg via RESPIRATORY_TRACT

## 2013-09-23 MED ORDER — PREDNISONE 20 MG PO TABS
ORAL_TABLET | ORAL | Status: AC
  Filled 2013-09-23: qty 3

## 2013-09-23 MED ORDER — ALBUTEROL SULFATE (2.5 MG/3ML) 0.083% IN NEBU
INHALATION_SOLUTION | RESPIRATORY_TRACT | Status: AC
  Filled 2013-09-23: qty 3

## 2013-09-23 MED ORDER — BENZONATATE 100 MG PO CAPS: 100.0000 mg | ORAL_CAPSULE | Freq: Three times a day (TID) | ORAL | Status: DC | PRN

## 2013-09-23 MED ORDER — ALBUTEROL SULFATE HFA 108 (90 BASE) MCG/ACT IN AERS: 1.0000 | INHALATION_SPRAY | Freq: Four times a day (QID) | RESPIRATORY_TRACT | Status: DC | PRN

## 2013-09-23 MED ORDER — PREDNISONE 20 MG PO TABS
60.0000 mg | ORAL_TABLET | Freq: Once | ORAL | Status: AC
  Administered 2013-09-23: 60 mg via ORAL

## 2013-09-23 NOTE — ED Notes (Signed)
C/o cough which is non productive States when she blows her nose she has green mucous Does have hx of asthma States body is achy

## 2013-09-23 NOTE — Discharge Instructions (Signed)
Your chest xray is without evidence of bronchitis or pneumonia. You do not need antibiotics. You need to quit smoking. Please use medications as prescribed for your cough and wheezing and follow up with your primary care doctor if you have no improvement. You can expect to cough for the next 7-10 days. You have a common cold.   Antibiotic Nonuse  Your caregiver felt that the infection or problem was not one that would be helped with an antibiotic. Infections may be caused by viruses or bacteria. Only a caregiver can tell which one of these is the likely cause of an illness. A cold is the most common cause of infection in both adults and children. A cold is a virus. Antibiotic treatment will have no effect on a viral infection. Viruses can lead to many lost days of work caring for sick children and many missed days of school. Children may catch as many as 10 "colds" or "flus" per year during which they can be tearful, cranky, and uncomfortable. The goal of treating a virus is aimed at keeping the ill person comfortable. Antibiotics are medications used to help the body fight bacterial infections. There are relatively few types of bacteria that cause infections but there are hundreds of viruses. While both viruses and bacteria cause infection they are very different types of germs. A viral infection will typically go away by itself within 7 to 10 days. Bacterial infections may spread or get worse without antibiotic treatment. Examples of bacterial infections are:  Sore throats (like strep throat or tonsillitis).  Infection in the lung (pneumonia).  Ear and skin infections. Examples of viral infections are:  Colds or flus.  Most coughs and bronchitis.  Sore throats not caused by Strep.  Runny noses. It is often best not to take an antibiotic when a viral infection is the cause of the problem. Antibiotics can kill off the helpful bacteria that we have inside our body and allow harmful bacteria to  start growing. Antibiotics can cause side effects such as allergies, nausea, and diarrhea without helping to improve the symptoms of the viral infection. Additionally, repeated uses of antibiotics can cause bacteria inside of our body to become resistant. That resistance can be passed onto harmful bacterial. The next time you have an infection it may be harder to treat if antibiotics are used when they are not needed. Not treating with antibiotics allows our own immune system to develop and take care of infections more efficiently. Also, antibiotics will work better for us when they are prescribed for bacterial infections. Treatments for a child that is ill may include:  Give extra fluids throughout the day to stay hydrated.  Get plenty of rest.  Only give your child over-the-counter or prescription medicines for pain, discomfort, or fever as directed by your caregiver.  The use of a cool mist humidifier may help stuffy noses.  Cold medications if suggested by your caregiver. Your caregiver may decide to start you on an antibiotic if:  The problem you were seen for today continues for a longer length of time than expected.  You develop a secondary bacterial infection. SEEK MEDICAL CARE IF:  Fever lasts longer than 5 days.  Symptoms continue to get worse after 5 to 7 days or become severe.  Difficulty in breathing develops.  Signs of dehydration develop (poor drinking, rare urinating, dark colored urine).  Changes in behavior or worsening tiredness (listlessness or lethargy). Document Released: 05/23/2001 Document Revised: 06/06/2011 Document Reviewed: 11/19/2008 Exit[employer] Patient  Information 2015 [place], [place]. This information is not intended to replace advice given to you by your health care provider. Make sure you discuss any questions you have with your health care provider.  Asthma, Acute Bronchospasm Acute bronchospasm caused by asthma is also referred to as an asthma attack.  Bronchospasm means your air passages become narrowed. The narrowing is caused by inflammation and tightening of the muscles in the air tubes (bronchi) in your lungs. This can make it hard to breathe or cause you to wheeze and cough. CAUSES Possible triggers are:  Animal dander from the skin, hair, or feathers of animals.  Dust mites contained in house dust.  Cockroaches.  Pollen from trees or grass.  Mold.  Cigarette or tobacco smoke.  Air pollutants such as dust, household cleaners, hair sprays, aerosol sprays, paint fumes, strong chemicals, or strong odors.  Cold air or weather changes. Cold air may trigger inflammation. Winds increase molds and pollens in the air.  Strong emotions such as crying or laughing hard.  Stress.  Certain medicines such as aspirin or beta-blockers.  Sulfites in foods and drinks, such as dried fruits and wine.  Infections or inflammatory conditions, such as a flu, cold, or inflammation of the nasal membranes (rhinitis).  Gastroesophageal reflux disease (GERD). GERD is a condition where stomach acid backs up into your esophagus.  Exercise or strenuous activity. SIGNS AND SYMPTOMS   Wheezing.  Excessive coughing, particularly at night.  Chest tightness.  Shortness of breath. DIAGNOSIS  Your health care provider will ask you about your medical history and perform a physical exam. A chest X-ray or blood testing may be performed to look for other causes of your symptoms or other conditions that may have triggered your asthma attack. TREATMENT  Treatment is aimed at reducing inflammation and opening up the airways in your lungs. Most asthma attacks are treated with inhaled medicines. These include quick relief or rescue medicines (such as bronchodilators) and controller medicines (such as inhaled corticosteroids). These medicines are sometimes given through an inhaler or a nebulizer. Systemic steroid medicine taken by mouth or given through an IV  tube also can be used to reduce the inflammation when an attack is moderate or severe. Antibiotic medicines are only used if a bacterial infection is present.  HOME CARE INSTRUCTIONS   Rest.  Drink plenty of liquids. This helps the mucus to remain thin and be easily coughed up. Only use caffeine in moderation and do not use alcohol until you have recovered from your illness.  Do not smoke. Avoid being exposed to secondhand smoke.  You play a critical role in keeping yourself in good health. Avoid exposure to things that cause you to wheeze or to have breathing problems.  Keep your medicines up-to-date and available. Carefully follow your health care provider's treatment plan.  Take your medicine exactly as prescribed.  When pollen or pollution is bad, keep windows closed and use an air conditioner or go to places with air conditioning.  Asthma requires careful medical care. See your health care provider for a follow-up as advised. If you are more than [redacted] weeks pregnant and you were prescribed any new medicines, let your obstetrician know about the visit and how you are doing. Follow up with your health care provider as directed.  After you have recovered from your asthma attack, make an appointment with your outpatient doctor to talk about ways to reduce the likelihood of future attacks. If you do not have a doctor who manages  your asthma, make an appointment with a primary care doctor to discuss your asthma. SEEK IMMEDIATE MEDICAL CARE IF:   You are getting worse.  You have trouble breathing. If severe, call your local emergency services (911 in the [place]  You develop chest pain or discomfort.  You are vomiting.  You are not able to keep fluids down.  You are coughing up yellow, green, brown, or bloody sputum.  You have a fever and your symptoms suddenly get worse.  You have trouble swallowing. MAKE SURE YOU:   Understand these instructions.  Will watch your  condition.  Will get help right away if you are not doing well or get worse. Document Released: 06/29/2006 Document Revised: 03/19/2013 Document Reviewed: 09/19/2012 [facility] Patient Information 2015 [place], [place]. This information is not intended to replace advice given to you by your health care provider. Make sure you discuss any questions you have with your health care provider.  Cough, Adult  A cough is a reflex that helps clear your throat and airways. It can help heal the body or may be a reaction to an irritated airway. A cough may only last 2 or 3 weeks (acute) or may last more than 8 weeks (chronic).  CAUSES Acute cough:  Viral or bacterial infections. Chronic cough:  Infections.  Allergies.  Asthma.  Post-nasal drip.  Smoking.  Heartburn or acid reflux.  Some medicines.  Chronic lung problems (COPD).  Cancer. SYMPTOMS   Cough.  Fever.  Chest pain.  Increased breathing rate.  High-pitched whistling sound when breathing (wheezing).  Colored mucus that you cough up (sputum). TREATMENT   A bacterial cough may be treated with antibiotic medicine.  A viral cough must run its course and will not respond to antibiotics.  Your caregiver may recommend other treatments if you have a chronic cough. HOME CARE INSTRUCTIONS   Only take over-the-counter or prescription medicines for pain, discomfort, or fever as directed by your caregiver. Use cough suppressants only as directed by your caregiver.  Use a cold steam vaporizer or humidifier in your bedroom or home to help loosen secretions.  Sleep in a semi-upright position if your cough is worse at night.  Rest as needed.  Stop smoking if you smoke. SEEK IMMEDIATE MEDICAL CARE IF:   You have pus in your sputum.  Your cough starts to worsen.  You cannot control your cough with suppressants and are losing sleep.  You begin coughing up blood.  You have difficulty breathing.  You develop pain which is  getting worse or is uncontrolled with medicine.  You have a fever. MAKE SURE YOU:   Understand these instructions.  Will watch your condition.  Will get help right away if you are not doing well or get worse. Document Released: 09/10/2010 Document Revised: 06/06/2011 Document Reviewed: 09/10/2010 [facility]ExitCare Patient Information 2015 [place], [place]. This information is not intended to replace advice given to you by your health care provider. Make sure you discuss any questions you have with your health care provider.  Upper Respiratory Infection, Adult An upper respiratory infection (URI) is also sometimes known as the common cold. The upper respiratory tract includes the nose, sinuses, throat, trachea, and bronchi. Bronchi are the airways leading to the lungs. Most people improve within 1 week, but symptoms can last up to 2 weeks. A residual cough may last even longer.  CAUSES Many different viruses can infect the tissues lining the upper respiratory tract. The tissues become irritated and inflamed and often become very  moist. Mucus production is also common. A cold is contagious. You can easily spread the virus to others by oral contact. This includes kissing, sharing a glass, coughing, or sneezing. Touching your mouth or nose and then touching a surface, which is then touched by another person, can also spread the virus. SYMPTOMS  Symptoms typically develop 1 to 3 days after you come in contact with a cold virus. Symptoms vary from person to person. They may include:  Runny nose.  Sneezing.  Nasal congestion.  Sinus irritation.  Sore throat.  Loss of voice (laryngitis).  Cough.  Fatigue.  Muscle aches.  Loss of appetite.  Headache.  Low-grade fever. DIAGNOSIS  You might diagnose your own cold based on familiar symptoms, since most people get a cold 2 to 3 times a year. Your caregiver can confirm this based on your exam. Most importantly, your caregiver can check that your  symptoms are not due to another disease such as strep throat, sinusitis, pneumonia, asthma, or epiglottitis. Blood tests, throat tests, and X-rays are not necessary to diagnose a common cold, but they may sometimes be helpful in excluding other more serious diseases. Your caregiver will decide if any further tests are required. RISKS AND COMPLICATIONS  You may be at risk for a more severe case of the common cold if you smoke cigarettes, have chronic heart disease (such as heart failure) or lung disease (such as asthma), or if you have a weakened immune system. The very young and very old are also at risk for more serious infections. Bacterial sinusitis, middle ear infections, and bacterial pneumonia can complicate the common cold. The common cold can worsen asthma and chronic obstructive pulmonary disease (COPD). Sometimes, these complications can require emergency medical care and may be life-threatening. PREVENTION  The best way to protect against getting a cold is to practice good hygiene. Avoid oral or hand contact with people with cold symptoms. Wash your hands often if contact occurs. There is no clear evidence that vitamin C, vitamin E, echinacea, or exercise reduces the chance of developing a cold. However, it is always recommended to get plenty of rest and practice good nutrition. TREATMENT  Treatment is directed at relieving symptoms. There is no cure. Antibiotics are not effective, because the infection is caused by a virus, not by bacteria. Treatment may include:  Increased fluid intake. Sports drinks offer valuable electrolytes, sugars, and fluids.  Breathing heated mist or steam (vaporizer or shower).  Eating chicken soup or other clear broths, and maintaining good nutrition.  Getting plenty of rest.  Using gargles or lozenges for comfort.  Controlling fevers with ibuprofen or acetaminophen as directed by your caregiver.  Increasing usage of your inhaler if you have asthma. Zinc  gel and zinc lozenges, taken in the first 24 hours of the common cold, can shorten the duration and lessen the severity of symptoms. Pain medicines may help with fever, muscle aches, and throat pain. A variety of non-prescription medicines are available to treat congestion and runny nose. Your caregiver can make recommendations and may suggest nasal or lung inhalers for other symptoms.  HOME CARE INSTRUCTIONS   Only take over-the-counter or prescription medicines for pain, discomfort, or fever as directed by your caregiver.  Use a warm mist humidifier or inhale steam from a shower to increase air moisture. This may keep secretions moist and make it easier to breathe.  Drink enough water and fluids to keep your urine clear or pale yellow.  Rest as needed.  Return to work when your temperature has returned to normal or as your caregiver advises. You may need to stay home longer to avoid infecting others. You can also use a face mask and careful hand washing to prevent spread of the virus. SEEK MEDICAL CARE IF:   After the first few days, you feel you are getting worse rather than better.  You need your caregiver's advice about medicines to control symptoms.  You develop chills, worsening shortness of breath, or brown or red sputum. These may be signs of pneumonia.  You develop yellow or brown nasal discharge or pain in the face, especially when you bend forward. These may be signs of sinusitis.  You develop a fever, swollen neck glands, pain with swallowing, or white areas in the back of your throat. These may be signs of strep throat. SEEK IMMEDIATE MEDICAL CARE IF:   You have a fever.  You develop severe or persistent headache, ear pain, sinus pain, or chest pain.  You develop wheezing, a prolonged cough, cough up blood, or have a change in your usual mucus (if you have chronic lung disease).  You develop sore muscles or a stiff neck. Document Released: 09/07/2000 Document Revised:  06/06/2011 Document Reviewed: 07/16/2010 [facility] Patient Information 2015 [place], [place]. This information is not intended to replace advice given to you by your health care provider. Make sure you discuss any questions you have with your health care provider.

## 2013-09-23 NOTE — ED Provider Notes (Signed)
CSN: [id number]     Arrival date & time 09/23/13  1921 History   First MD Initiated Contact with Patient 09/23/13 2021     Chief Complaint  Patient presents with  . Cough   (Consider location/radiation/quality/duration/timing/severity/associated sxs/prior Treatment) HPI Comments: Patient presents with a one week history of cough with associated wheezing. States she has run out of her albuterol inhaler and has not contacted her PCP. Denies fever or hemoptysis. Patient continues to smoke despite having diagnosis of COPD/emphysema. Denies chest pain.   The history is provided by the patient.    Past Medical History  Diagnosis Date  . Asthma   . Emphysema   . COPD (chronic obstructive pulmonary disease)   . Fibromyalgia, primary   . Anxiety   . Depressed   . Nephrolithiasis   . UTI (lower urinary tract infection)   . Kidney stone   . Hepatitis C   . Dysrhythmia   . Fibromyalgia    Past Surgical History  Procedure Laterality Date  . Abdominal hysterectomy    . Orif ankle fracture  02/03/2012    Procedure: OPEN REDUCTION INTERNAL FIXATION (ORIF) ANKLE FRACTURE;  Surgeon: Nadara Mustard, MD;  Location: [facility] OR;  Service: Orthopedics;  Laterality: Right;  Open Reduction Internal Fixation Right Ankle  . Tubal ligation    . Wisdom tooth extraction    . Orif clavicular fracture Left 08/29/2012    Dr Ophelia Charter  . Orif clavicular fracture Left 08/29/2012    Procedure: OPEN REDUCTION INTERNAL FIXATION (ORIF) CLAVICULAR FRACTURE;  Surgeon: Eldred Manges, MD;  Location: [facility] OR;  Service: Orthopedics;  Laterality: Left;  Open Reduction Internal Fixation Left Clavicle Fracture   Family History  Problem Relation Age of Onset  . Alcohol abuse Father   . Cirrhosis Father   . Cancer Father   . Hyperthyroidism Sister   . Bipolar disorder Sister   . Kidney disease Brother   . Heart attack Mother   . Hypertension Mother    History  Substance Use Topics  . Smoking status: Current Every Day Smoker --  0.40 packs/day for 10 years    Types: Cigarettes  . Smokeless tobacco: Never Used  . Alcohol Use: Yes     Comment: occasionally   OB History   Grav Para Term Preterm Abortions TAB SAB Ect Mult Living                 Obstetric Comments   S/p hysterectomy     Review of Systems  Constitutional: Positive for fatigue. Negative for fever and chills.  HENT: Positive for congestion. Negative for ear pain, postnasal drip, rhinorrhea, sore throat and voice change.   Eyes: Negative.   Respiratory: Positive for cough, chest tightness and wheezing.   Cardiovascular: Negative.   Gastrointestinal: Negative.   Genitourinary: Negative.   Musculoskeletal: Negative.   Skin: Negative.   Allergic/Immunologic: Negative for immunocompromised state.  Neurological: Negative.     Allergies  Eggs or egg-derived products; Milk-related compounds; and Sulfa antibiotics  Home Medications   Prior to Admission medications   Medication Sig Start Date End Date Taking? Authorizing Provider  albuterol (PROVENTIL HFA;VENTOLIN HFA) 108 (90 BASE) MCG/ACT inhaler Inhale 2 puffs into the lungs every 4 (four) hours as needed for wheezing. 02/20/12   Latrelle Dodrill, MD  albuterol (PROVENTIL HFA;VENTOLIN HFA) 108 (90 BASE) MCG/ACT inhaler Inhale 1-2 puffs into the lungs every 6 (six) hours as needed for wheezing or shortness of breath. 09/23/13   Victorino Dike  Narda BondsLee Presson, PA  azithromycin (ZITHROMAX Z-PAK) 250 MG tablet Use as directed 06/05/13   Graylon GoodZachary H Baker, PA-C  benzonatate (TESSALON) 100 MG capsule Take 1 capsule (100 mg total) by mouth 3 (three) times daily as needed for cough. 09/23/13   Ardis RowanJennifer Lee Presson, PA  CALCIUM PO Take 1 tablet by mouth daily.    Historical Provider, MD  chlorpheniramine-HYDROcodone (TUSSIONEX PENNKINETIC ER) 10-8 MG/5ML LQCR Take 5 mLs by mouth every 12 (twelve) hours as needed for cough. 06/05/13   Adrian BlackwaterZachary H Baker, PA-C  docusate sodium (COLACE) 100 MG capsule Take 1 capsule (100 mg  total) by mouth daily. 04/15/13   Arman FilterGail K Schulz, NP  fish oil-omega-3 fatty acids 1000 MG capsule Take 1 g by mouth 2 (two) times daily.    Historical Provider, MD  Multiple Vitamin (MULTIVITAMIN WITH MINERALS) TABS Take 1 tablet by mouth daily.    Historical Provider, MD  oxyCODONE (ROXICODONE) 5 MG immediate release tablet Take 1 tablet (5 mg total) by mouth every 4 (four) hours as needed for severe pain. 04/15/13   Arman FilterGail K Schulz, NP  predniSONE (DELTASONE) 10 MG tablet Beginning 09-24-2013 take 5 tabs po QD day 1, 4 tabs po QD day 2, 3 tabs po QD day 3, 2 tabs po QD day 4, 1 tab po Qd day 5 then stop 09/23/13   Ardis RowanJennifer Lee Presson, PA  predniSONE (DELTASONE) 50 MG tablet Take 1 tablet (50 mg total) by mouth daily with breakfast. 06/05/13   Graylon GoodZachary H Baker, PA-C  vitamin B-12 (CYANOCOBALAMIN) 1000 MCG tablet Take 1,000 mcg by mouth daily.    Historical Provider, MD   BP 125/76  Pulse 80  Temp(Src) 98 F (36.7 C) (Oral)  Resp 20  SpO2 100% Physical Exam  Nursing note and vitals reviewed. Constitutional: She is oriented to person, place, and time. She appears well-developed and well-nourished. No distress.  Afebrile No hypoxia  HENT:  Head: Normocephalic and atraumatic.  Right Ear: External ear normal.  Left Ear: External ear normal.  Nose: Nose normal.  Mouth/Throat: Oropharynx is clear and moist.  Eyes: Conjunctivae are normal. No scleral icterus.  Neck: Normal range of motion. Neck supple.  Cardiovascular: Normal rate, regular rhythm and normal heart sounds.   Pulmonary/Chest: Effort normal. No respiratory distress. She has wheezes. She has no rales. She exhibits no tenderness.  Abdominal: Soft. Bowel sounds are normal. She exhibits no distension. There is no tenderness.  Musculoskeletal: Normal range of motion. She exhibits no edema and no tenderness.  Lymphadenopathy:    She has no cervical adenopathy.  Neurological: She is alert and oriented to person, place, and time.  Skin: Skin  is warm and dry.  Psychiatric: She has a normal mood and affect. Her behavior is normal.    ED Course  Procedures (including critical care time) Labs Review Labs Reviewed - No data to display  Imaging Review Dg Chest 2 View  09/23/2013   CLINICAL DATA:  Cough x 10 days  EXAM: CHEST  2 VIEW  COMPARISON:  None.  FINDINGS: The heart size and mediastinal contours are within normal limits. Both lungs are clear. Old left clavicular fracture with hardware in place.  IMPRESSION: No active cardiopulmonary disease.   Electronically Signed   By: Elige KoHetal  Patel   On: 09/23/2013 20:55     MDM   1. URI (upper respiratory infection)   2. Bronchospasm   Patient given albuterol 5mg  and atrovent 0.5mg  [facility]   oral prednisone. States she is feeling much better following nebtx. CXR unremarkable for acute illness. Will treat for mild URI with associated COPD/asthma exacerbation with oral prednisone taper and albuterol MDI. Advised close follow up with PCP if symptoms do not improve.   Jess BartersJennifer Lee OlusteePresson, [place] 09/24/13 [phone]

## 2013-09-27 NOTE — ED Provider Notes (Signed)
Medical screening examination/treatment/procedure(s) were performed by resident physician or non-physician practitioner and as supervising physician I was immediately available for consultation/collaboration.   KINDL,JAMES DOUGLAS MD.   James D Kindl, MD 09/27/13 1336 

## 2013-10-20 ENCOUNTER — Emergency Department (HOSPITAL_COMMUNITY): Payer: Medicaid Other

## 2013-10-20 ENCOUNTER — Encounter (HOSPITAL_COMMUNITY): Payer: Self-pay | Admitting: Emergency Medicine

## 2013-10-20 ENCOUNTER — Emergency Department (HOSPITAL_COMMUNITY)
Admission: EM | Admit: 2013-10-20 | Discharge: 2013-10-20 | Disposition: A | Discharge status: Home / Self Care | Payer: Medicaid Other | Attending: Emergency Medicine | Admitting: Emergency Medicine

## 2013-10-20 DIAGNOSIS — Z8659 Personal history of other mental and behavioral disorders: Secondary | ICD-10-CM | POA: Diagnosis not present

## 2013-10-20 DIAGNOSIS — M79609 Pain in unspecified limb: Secondary | ICD-10-CM | POA: Insufficient documentation

## 2013-10-20 DIAGNOSIS — Z8679 Personal history of other diseases of the circulatory system: Secondary | ICD-10-CM | POA: Insufficient documentation

## 2013-10-20 DIAGNOSIS — J4489 Other specified chronic obstructive pulmonary disease: Secondary | ICD-10-CM | POA: Insufficient documentation

## 2013-10-20 DIAGNOSIS — Z79899 Other long term (current) drug therapy: Secondary | ICD-10-CM | POA: Diagnosis not present

## 2013-10-20 DIAGNOSIS — Z87440 Personal history of urinary (tract) infections: Secondary | ICD-10-CM | POA: Insufficient documentation

## 2013-10-20 DIAGNOSIS — Z8619 Personal history of other infectious and parasitic diseases: Secondary | ICD-10-CM | POA: Diagnosis not present

## 2013-10-20 DIAGNOSIS — G5630 Lesion of radial nerve, unspecified upper limb: Secondary | ICD-10-CM | POA: Diagnosis not present

## 2013-10-20 DIAGNOSIS — Z87442 Personal history of urinary calculi: Secondary | ICD-10-CM | POA: Insufficient documentation

## 2013-10-20 DIAGNOSIS — G5631 Lesion of radial nerve, right upper limb: Secondary | ICD-10-CM

## 2013-10-20 DIAGNOSIS — J449 Chronic obstructive pulmonary disease, unspecified: Secondary | ICD-10-CM | POA: Diagnosis not present

## 2013-10-20 DIAGNOSIS — F17200 Nicotine dependence, unspecified, uncomplicated: Secondary | ICD-10-CM | POA: Diagnosis not present

## 2013-10-20 HISTORY — DX: Carpal tunnel syndrome, unspecified upper limb: G56.00

## 2013-10-20 MED ORDER — DIAZEPAM 5 MG PO TABS
5.0000 mg | ORAL_TABLET | Freq: Once | ORAL | Status: AC
  Administered 2013-10-20: 5 mg via ORAL
  Filled 2013-10-20: qty 1

## 2013-10-20 MED ORDER — DIAZEPAM 5 MG PO TABS: 5.0000 mg | ORAL_TABLET | Freq: Three times a day (TID) | ORAL | Status: DC | PRN

## 2013-10-20 MED ORDER — HYDROCODONE-ACETAMINOPHEN 5-325 MG PO TABS
2.0000 | ORAL_TABLET | Freq: Once | ORAL | Status: AC
  Administered 2013-10-20: 2 via ORAL
  Filled 2013-10-20: qty 2

## 2013-10-20 NOTE — Discharge Instructions (Signed)
Radial Nerve Palsy Wrist drop is also known as radial nerve palsy. It is a condition in which you can not extend your wrist. This means if you are standing with your elbow bent at a right angle and with the top of your hand pointed at the ceiling, you can not hold your hand up. It falls toward the floor.  This action of extending your wrist is caused by the muscles in the back of your arm. These muscles are controlled by the radial nerve. This means that anything affecting the radial nerve so it can not tell the muscles how to work will cause wrist drop. This is medically called radial nerve palsy. Also the radial nerve is a motor and sensory nerve so anything affecting it causes problems with movement and feeling. CAUSES  Some more common causes of wrist drop are:  A break (fracture) of the large bone in the arm between your shoulder and your elbow (humerus). This is because the radial nerve winds around the humerus.  Improper use of crutches causes this because the radial nerve runs through the armpit (axilla). Crutches which are too long can put pressure on the nerve. This is sometimes called crutch palsy.  Falling asleep with your arm over a chair and supported on the back is a common cause. This is sometimes called Saturday Night Syndrome.  Wrist drop can be associated with lead poisoning because of the effect of lead on the radial nerve. SYMPTOMS  The wrist drop is an obvious problem, but there may also be numbness in the back of the arm, forearm or hand which provides feeling in these areas by the radial nerve. There can be difficulty straightening out the elbow in addition to the wrist. There may be numbness, tingling, pain, burning sensations or other abnormal feelings. Symptoms depend entirely on where the radial nerve is injured. DIAGNOSIS   Wrist drop is obvious just by looking at it. Your caregiver may make the diagnosis by taking your history and doing a couple tests.  One test which  may be done is a nerve conduction study. This test shows if the radial nerve is conducting signals well. If not, it can determine where the nerve problem is.  Sometimes X-ray studies are done. Your caregiver will determine if further testing needs to be done. TREATMENT   Usually if the problem is found to be pressure on the nerve, simply removing the pressure will allow the nerve to go back to normal in a few weeks to a few months. Other treatments will depend upon the cause found.  Only take over-the-counter or prescription medicines for pain, discomfort, or fever as directed by your caregiver.  Sometimes seizure medications are used.  Steroids are sometimes given to decrease swelling if it is thought to be a possible cause. Document Released: 11/18/2005 Document Revised: 06/06/2011 Document Reviewed: 05/29/2013 Exit[employer] Patient Information 2015 [employer]. This information is not intended to replace advice given to you by your health care provider. Make sure you discuss any questions you have with your health care provider.  

## 2013-10-20 NOTE — ED Provider Notes (Signed)
Medical screening examination/treatment/procedure(s) were conducted as a shared visit with non-physician practitioner(s) and myself.  I personally evaluated the patient during the encounter.   EKG Interpretation None      Spontaneously difficulty with wrist extension. Woke up with it. No trauma, did not sleep with arm draped over a bench last night. No fevers or vomiting. Having wrist pain, hx of carpal tunnel. Normal sensation on exam, wrist extension out. Exam c/w radial nerve palsy. Placed in splint. Wrist pain likely due to carpal tunnel syndrome and unopposed wrist flexion. Given note for work, confirmed no acute tx with Neuro. Given pain meds.  Dagmar HaitWilliam Brook Geraci, MD 10/20/13 1630

## 2013-10-20 NOTE — ED Notes (Addendum)
She states she's been unable to move her R hand/wrist since waking up this am. She can not hold anything with the hand. She states the hand was normal last night. She denies any injuries. She has a h/o carpal tunnel. She is A&ox4, can MAE, speech is clear, no facial droop or arm drift.

## 2013-10-20 NOTE — ED Provider Notes (Signed)
CSN: [id number]     Arrival date & time 10/20/13  1306 History   First MD Initiated Contact with Patient 10/20/13 1456     Chief Complaint  Patient presents with  . Hand Pain     (Consider location/radiation/quality/duration/timing/severity/associated sxs/prior Treatment) HPI Comments: Patient presents to the ED with a chief complaint of right hand pain and weakness.  She states that when she awoke this morning at 8:00 her fingers were numb and then at around 10:00 she lost all motor control to her right hand.  She reports moderate to severe pain in the right hand.  She states that she was drinking last night.  She has not taken anything to alleviate her symptoms.  She has a history of carpal tunnel syndrome.  She has never experienced anything like this before.  The history is provided by the patient. No language interpreter was used.    Past Medical History  Diagnosis Date  . Asthma   . Emphysema   . COPD (chronic obstructive pulmonary disease)   . Fibromyalgia, primary   . Anxiety   . Depressed   . Nephrolithiasis   . UTI (lower urinary tract infection)   . Kidney stone   . Hepatitis C   . Dysrhythmia   . Fibromyalgia   . Carpal tunnel syndrome    Past Surgical History  Procedure Laterality Date  . Abdominal hysterectomy    . Orif ankle fracture  02/03/2012    Procedure: OPEN REDUCTION INTERNAL FIXATION (ORIF) ANKLE FRACTURE;  Surgeon: Nadara MustardMarcus V Duda, MD;  Location: [facility] OR;  Service: Orthopedics;  Laterality: Right;  Open Reduction Internal Fixation Right Ankle  . Tubal ligation    . Wisdom tooth extraction    . Orif clavicular fracture Left 08/29/2012    Dr [name]  . Orif clavicular fracture Left 08/29/2012    Procedure: OPEN REDUCTION INTERNAL FIXATION (ORIF) CLAVICULAR FRACTURE;  Surgeon: Eldred MangesMark C Yates, MD;  Location: [facility] OR;  Service: Orthopedics;  Laterality: Left;  Open Reduction Internal Fixation Left Clavicle Fracture   Family History  Problem Relation Age of Onset  .  Alcohol abuse Father   . Cirrhosis Father   . Cancer Father   . Hyperthyroidism Sister   . Bipolar disorder Sister   . Kidney disease Brother   . Heart attack Mother   . Hypertension Mother    History  Substance Use Topics  . Smoking status: Current Every Day Smoker -- 0.40 packs/day for 10 years    Types: Cigarettes  . Smokeless tobacco: Never Used  . Alcohol Use: Yes     Comment: occasionally   OB History   Grav Para Term Preterm Abortions TAB SAB Ect Mult Living                 Obstetric Comments   S/p hysterectomy     Review of Systems  All other systems reviewed and are negative.     Allergies  Eggs or egg-derived products; Milk-related compounds; and Sulfa antibiotics  Home Medications   Prior to Admission medications   Medication Sig Start Date End Date Taking? Authorizing Provider  CALCIUM PO Take 1 tablet by mouth daily.   Yes Historical Provider, MD  fish oil-omega-3 fatty acids 1000 MG capsule Take 1 g by mouth 2 (two) times daily.   Yes Historical Provider, MD  ibuprofen (ADVIL,MOTRIN) 200 MG tablet Take 400 mg by mouth every 6 (six) hours as needed for headache (pain).   Yes Historical Provider, MD  Multiple Vitamin (MULTIVITAMIN WITH MINERALS) TABS Take 1 tablet by mouth daily.   Yes Historical Provider, MD  vitamin B-12 (CYANOCOBALAMIN) 1000 MCG tablet Take 1,000 mcg by mouth daily.   Yes Historical Provider, MD  albuterol (PROVENTIL HFA;VENTOLIN HFA) 108 (90 BASE) MCG/ACT inhaler Inhale 2 puffs into the lungs every 4 (four) hours as needed for wheezing. 02/20/12   Latrelle Dodrill, MD   BP 126/81  Pulse 113  Temp(Src) 97.6 F (36.4 C) (Oral)  Resp 16  Ht 5\' 6"  (1.676 m)  Wt 148 lb (67.132 kg)  BMI 23.90 kg/m2  SpO2 98% Physical Exam  Nursing note and vitals reviewed. Constitutional: She is oriented to person, place, and time. She appears well-developed and well-nourished.  HENT:  Head: Normocephalic and atraumatic.  Eyes: Conjunctivae and  EOM are normal. Pupils are equal, round, and reactive to light.  Neck: Normal range of motion. Neck supple.  Cardiovascular: Normal rate and regular rhythm.  Exam reveals no gallop and no friction rub.   No murmur heard. Intact distal pulses  Pulmonary/Chest: Effort normal and breath sounds normal. No respiratory distress. She has no wheezes. She has no rales. She exhibits no tenderness.  Abdominal: Soft. Bowel sounds are normal. She exhibits no distension and no mass. There is no tenderness. There is no rebound and no guarding.  Musculoskeletal: Normal range of motion. She exhibits no edema and no tenderness.  0/5 ROM and strength right wrist extension, 5/5 ROM and strength wrist flexion 5/5 ROM and strength in the right fingers flexion, 0/5 ROM and strength right finger extension  5/5 ROM and strength of pronation and suppination  Neurological: She is alert and oriented to person, place, and time.  Sensation is intact  Skin: Skin is warm and dry.  Right hand skin is warm, no lesions  Psychiatric: She has a normal mood and affect. Her behavior is normal. Judgment and thought content normal.    ED Course  Procedures (including critical care time) Labs Review Labs Reviewed - No data to display  Imaging Review Dg Wrist Complete Right  10/20/2013   CLINICAL DATA:  38 year old female with right wrist numbness.  EXAM: RIGHT WRIST - COMPLETE 3+ VIEW  COMPARISON:  12/28/2010 radiographs  FINDINGS: There is no evidence of fracture or dislocation. There is no evidence of arthropathy or other focal bone abnormality. Soft tissues are unremarkable.  IMPRESSION: Negative.   Electronically Signed   By: Laveda Abbe M.D.   On: 10/20/2013 16:04     EKG Interpretation None      MDM   Final diagnoses:  Radial nerve palsy, right   Patient with radial nerve palsy.  Patient seen by and discussed with Dr. Gwendolyn Grant, who agrees with the plan.  Will consult neurology for any recommendations, but plan for  cock-up splint and PT follow-up.  3:43 PM Patient discussed with Dr. Roseanne Reno from neurology, who confirms that there is no additional treatment for radial nerve palsy other than cock-up splint and PT.    Roxy Horseman, PA-C 10/20/13 1607

## 2013-10-20 NOTE — ED Notes (Signed)
EDP at bedside  

## 2013-10-21 ENCOUNTER — Emergency Department (HOSPITAL_COMMUNITY): Payer: Medicaid Other

## 2013-10-21 ENCOUNTER — Emergency Department (HOSPITAL_COMMUNITY)
Admission: EM | Admit: 2013-10-21 | Discharge: 2013-10-21 | Disposition: A | Discharge status: Home / Self Care | Payer: Medicaid Other | Attending: Emergency Medicine | Admitting: Emergency Medicine

## 2013-10-21 ENCOUNTER — Encounter (HOSPITAL_COMMUNITY): Payer: Self-pay | Admitting: Emergency Medicine

## 2013-10-21 DIAGNOSIS — J45909 Unspecified asthma, uncomplicated: Secondary | ICD-10-CM | POA: Diagnosis not present

## 2013-10-21 DIAGNOSIS — M25549 Pain in joints of unspecified hand: Secondary | ICD-10-CM | POA: Diagnosis present

## 2013-10-21 DIAGNOSIS — G5631 Lesion of radial nerve, right upper limb: Secondary | ICD-10-CM

## 2013-10-21 DIAGNOSIS — Z87442 Personal history of urinary calculi: Secondary | ICD-10-CM | POA: Insufficient documentation

## 2013-10-21 DIAGNOSIS — Z79899 Other long term (current) drug therapy: Secondary | ICD-10-CM | POA: Insufficient documentation

## 2013-10-21 DIAGNOSIS — R4789 Other speech disturbances: Secondary | ICD-10-CM | POA: Diagnosis not present

## 2013-10-21 DIAGNOSIS — F329 Major depressive disorder, single episode, unspecified: Secondary | ICD-10-CM | POA: Insufficient documentation

## 2013-10-21 DIAGNOSIS — Z8619 Personal history of other infectious and parasitic diseases: Secondary | ICD-10-CM | POA: Diagnosis not present

## 2013-10-21 DIAGNOSIS — F3289 Other specified depressive episodes: Secondary | ICD-10-CM | POA: Insufficient documentation

## 2013-10-21 DIAGNOSIS — Z87440 Personal history of urinary (tract) infections: Secondary | ICD-10-CM | POA: Diagnosis not present

## 2013-10-21 DIAGNOSIS — Z8679 Personal history of other diseases of the circulatory system: Secondary | ICD-10-CM | POA: Insufficient documentation

## 2013-10-21 DIAGNOSIS — F411 Generalized anxiety disorder: Secondary | ICD-10-CM | POA: Insufficient documentation

## 2013-10-21 DIAGNOSIS — F17200 Nicotine dependence, unspecified, uncomplicated: Secondary | ICD-10-CM | POA: Insufficient documentation

## 2013-10-21 DIAGNOSIS — J438 Other emphysema: Secondary | ICD-10-CM | POA: Diagnosis not present

## 2013-10-21 DIAGNOSIS — Z791 Long term (current) use of non-steroidal anti-inflammatories (NSAID): Secondary | ICD-10-CM | POA: Diagnosis not present

## 2013-10-21 DIAGNOSIS — G5630 Lesion of radial nerve, unspecified upper limb: Secondary | ICD-10-CM | POA: Diagnosis not present

## 2013-10-21 MED ORDER — MELOXICAM 15 MG PO TABS: 15.0000 mg | ORAL_TABLET | Freq: Every day | ORAL | Status: DC

## 2013-10-21 MED ORDER — HYDROCODONE-ACETAMINOPHEN 5-325 MG PO TABS
1.0000 | ORAL_TABLET | Freq: Once | ORAL | Status: AC
  Administered 2013-10-21: 1 via ORAL
  Filled 2013-10-21: qty 1

## 2013-10-21 NOTE — ED Provider Notes (Signed)
CSN: [id number]     Arrival date & time 10/21/13  1833 History  This chart was scribed for non-physician provider Dierdre Forth, PA-C, working with Gerhard Munch, MD by Phillis Haggis, ED Scribe. This patient was seen in room TR08C/TR08C and patient care was started at 8:32 PM.       Chief Complaint  Patient presents with  . Hand Pain    The patient was seen here last night and was discharged.  She was given a resourde guide so that she could find a PT and received PT for her right hand.     The history is provided by the patient, medical records and a significant other. No language interpreter was used.   HPI Comments: [name] is a 38 y.o. female who presents to the Emergency Department complaining of right hand pain onset one day ago. She states that she was seen here last night for loss of feeling in her hand. She states that the numbness and weakness of the hand is constant but now has severe pain in the wrist. She states that she has Medicaid and the PT she was referred to told her that they did not take Medicaid and was unable to be seen. She states that she has taken ibuprofen for the pain to no relief. Her significant other states that he noticed facial drooping and slurred speech in the patient beginning 3 weeks ago and the patient states that she has had intermittent headaches for many months.  Pt reports that she is not concerned about her speech only the pain in her wrist.  The patient has been wearing a brace on her hand.   Past Medical History  Diagnosis Date  . Asthma   . Emphysema   . COPD (chronic obstructive pulmonary disease)   . Fibromyalgia, primary   . Anxiety   . Depressed   . Nephrolithiasis   . UTI (lower urinary tract infection)   . Kidney stone   . Hepatitis C   . Dysrhythmia   . Fibromyalgia   . Carpal tunnel syndrome    Past Surgical History  Procedure Laterality Date  . Abdominal hysterectomy    . Orif ankle fracture  02/03/2012     Procedure: OPEN REDUCTION INTERNAL FIXATION (ORIF) ANKLE FRACTURE;  Surgeon: Nadara Mustard, MD;  Location: [facility] OR;  Service: Orthopedics;  Laterality: Right;  Open Reduction Internal Fixation Right Ankle  . Tubal ligation    . Wisdom tooth extraction    . Orif clavicular fracture Left 08/29/2012    Dr Ophelia Charter  . Orif clavicular fracture Left 08/29/2012    Procedure: OPEN REDUCTION INTERNAL FIXATION (ORIF) CLAVICULAR FRACTURE;  Surgeon: Eldred Manges, MD;  Location: [facility] OR;  Service: Orthopedics;  Laterality: Left;  Open Reduction Internal Fixation Left Clavicle Fracture   Family History  Problem Relation Age of Onset  . Alcohol abuse Father   . Cirrhosis Father   . Cancer Father   . Hyperthyroidism Sister   . Bipolar disorder Sister   . Kidney disease Brother   . Heart attack Mother   . Hypertension Mother    History  Substance Use Topics  . Smoking status: Current Every Day Smoker -- 0.40 packs/day for 10 years    Types: Cigarettes  . Smokeless tobacco: Never Used  . Alcohol Use: Yes     Comment: occasionally   OB History   Grav Para Term Preterm Abortions TAB SAB Ect Mult Living  Obstetric Comments   S/p hysterectomy     Review of Systems  Constitutional: Negative for fever, chills, diaphoresis, appetite change, fatigue and unexpected weight change.  HENT: Negative for mouth sores.   Eyes: Negative for visual disturbance.  Respiratory: Negative for cough, chest tightness, shortness of breath and wheezing.   Cardiovascular: Negative for chest pain.  Gastrointestinal: Negative for nausea, vomiting, abdominal pain, diarrhea and constipation.  Endocrine: Negative for polydipsia, polyphagia and polyuria.  Genitourinary: Negative for dysuria, urgency, frequency and hematuria.  Musculoskeletal: Positive for arthralgias. Negative for back pain and neck stiffness.  Skin: Negative for rash.  Allergic/Immunologic: Negative for immunocompromised state.  Neurological:  Positive for speech difficulty, weakness and numbness. Negative for syncope, light-headedness and headaches.  Hematological: Does not bruise/bleed easily.  Psychiatric/Behavioral: Negative for sleep disturbance. The patient is not nervous/anxious.    Allergies  Eggs or egg-derived products; Milk-related compounds; and Sulfa antibiotics  Home Medications   Prior to Admission medications   Medication Sig Start Date End Date Taking? Authorizing Provider  albuterol (PROVENTIL HFA;VENTOLIN HFA) 108 (90 BASE) MCG/ACT inhaler Inhale 2 puffs into the lungs every 4 (four) hours as needed for wheezing. 02/20/12  Yes Latrelle Dodrill, MD  CALCIUM PO Take 1 tablet by mouth daily.   Yes Historical Provider, MD  diazepam (VALIUM) 5 MG tablet Take 1 tablet (5 mg total) by mouth every 8 (eight) hours as needed for anxiety. 10/20/13  Yes Roxy Horseman, PA-C  fish oil-omega-3 fatty acids 1000 MG capsule Take 1 g by mouth 2 (two) times daily.   Yes Historical Provider, MD  ibuprofen (ADVIL,MOTRIN) 200 MG tablet Take 400 mg by mouth every 6 (six) hours as needed for headache (pain).   Yes Historical Provider, MD  Multiple Vitamin (MULTIVITAMIN WITH MINERALS) TABS Take 1 tablet by mouth daily.   Yes Historical Provider, MD  vitamin B-12 (CYANOCOBALAMIN) 1000 MCG tablet Take 1,000 mcg by mouth daily.   Yes Historical Provider, MD  meloxicam (MOBIC) 15 MG tablet Take 1 tablet (15 mg total) by mouth daily. 10/21/13   Jodeci Roarty, PA-C   BP 124/74  Resp 16  SpO2 96% Physical Exam  Nursing note and vitals reviewed. Constitutional: She appears well-developed and well-nourished. No distress.  HENT:  Head: Normocephalic and atraumatic.  Eyes: Conjunctivae are normal.  Neck: Normal range of motion.  Cardiovascular: Normal rate, regular rhythm, normal heart sounds and intact distal pulses.   No murmur heard. Capillary refill < 3 seconds in BUE  Pulmonary/Chest: Effort normal and breath sounds normal.   Musculoskeletal: She exhibits tenderness. She exhibits no edema.  Neurological: She is alert. Coordination normal.  Mental Status:  Alert, oriented, thought content appropriate. Speech fluent without evidence of aphasia (?slurred). Able to follow 2 step commands without difficulty.  Cranial Nerves:  II:  Peripheral visual fields grossly normal, pupils equal, round, reactive to light III,IV, VI: ptosis not present, extra-ocular motions intact bilaterally  V,VII: smile symmetric, facial light touch sensation equal VIII: hearing grossly normal bilaterally  IX,X: gag reflex present  XI: bilateral shoulder shrug equal and strong XII: midline tongue extension  Motor:  5/5 in the lower extremities bilaterally strong and equal dorsiflexion/plantar flexion 0/5 ROM and strength right wrist extension, 5/5 ROM and strength wrist flexion 0/5 ROM and strength in the right fingers flexion, 0/5 ROM and strength right finger extension, 5/5 ROM and strength of right pronation and suppination  5/5 strength in the LUE Sensory: Pinprick and light touch normal in all  extremities.  Deep Tendon Reflexes: 2+ and symmetric  Cerebellar: normal finger-to-nose with bilateral upper extremities Gait: normal gait and balance CV: distal pulses palpable throughout    Skin: Skin is warm and dry. She is not diaphoretic. No erythema.  No tenting of the skin  Psychiatric: She has a normal mood and affect.    ED Course  Procedures (including critical care time) DIAGNOSTIC STUDIES: Oxygen Saturation is 96% on room air, normal by my interpretation.    COORDINATION OF CARE: 8:43 PM-Discussed treatment plan which includes f/u with attending provider with pt at bedside and pt agreed to plan.   Labs Review Labs Reviewed - No data to display  Imaging Review Dg Wrist Complete Right  10/20/2013   CLINICAL DATA:  38 year old female with right wrist numbness.  EXAM: RIGHT WRIST - COMPLETE 3+ VIEW  COMPARISON:  12/28/2010  radiographs  FINDINGS: There is no evidence of fracture or dislocation. There is no evidence of arthropathy or other focal bone abnormality. Soft tissues are unremarkable.  IMPRESSION: Negative.   Electronically Signed   By: Laveda AbbeJeff  Hu M.D.   On: 10/20/2013 16:04   Ct Head Wo Contrast  10/21/2013   CLINICAL DATA:  Numbness and pain in the right hand. Slurred speech. Symptoms began 1 day ago.  EXAM: CT HEAD WITHOUT CONTRAST  TECHNIQUE: Contiguous axial images were obtained from the base of the skull through the vertex without intravenous contrast.  COMPARISON:  Head CT scan 04/26/2011.  FINDINGS: There is no evidence of acute intracranial abnormality including infarct, hemorrhage, mass lesion, mass effect, midline shift or abnormal extra-axial fluid collection. No hydrocephalus or pneumocephalus. The calvarium is intact. There is a mucous retention cyst or polyp in the left sphenoid sinus. Mild mucosal thickening right sphenoid sinus is noted. Remote appearing fractures about the left orbit and nasal bones are identified.  IMPRESSION: No acute intracranial abnormality.  Remote appearing nasal bone and left orbit fractures.  Mucous retention cyst or polyp left sphenoid sinus with mild mucosal thickening right sphenoid sinus also noted.   Electronically Signed   By: Drusilla Kannerhomas  Dalessio M.D.   On: 10/21/2013 21:24     EKG Interpretation None      MDM   Final diagnoses:  Radial nerve palsy, right   Claudie Reveringmanda L Sabol presents 24 hours after initial evaluation and diagnosis the right radial nerve palsy.  Patient now complaining of pain in the right wrist and husband is reporting 3 weeks of right facial droop and slurred speech. Patient with questionable slurred speech on exam however no noted facial droop and neuro exam is otherwise unremarkable without deficit.    Discussed with patient at length the need to followup with her primary care physician as this will be her gave way to further evaluation and  treatment of her right hand. Patient states understanding. Pain controlled here in the emergency department. X-ray from yesterday reviewed without evidence of acute findings.  Head CT obtained today without evidence of acute or subacute CVA.  Doubt CVA at this time.  Recommend patient see her primary care physician tomorrow with close followup with neurology for further evaluation.  I have personally reviewed patient's vitals, nursing note and any pertinent labs or imaging.  I performed an undressed physical exam.    At this time, it has been determined that no acute conditions requiring further emergency intervention. The patient/guardian have been advised of the diagnosis and plan. I reviewed all labs and imaging including any potential incidental findings. We  have discussed signs and symptoms that warrant return to the ED, such as new evidence of stroke including worsening facial droop or slurred speech.  Patient/guardian has voiced understanding and agreed to follow-up with the PCP or specialist in every 4 hours.  Vital signs are stable at discharge.   BP 124/74  Resp 16  SpO2 96%   I personally performed the services described in this documentation, which was scribed in my presence. The recorded information has been reviewed and is accurate.   The patient was discussed with and seen by Dr. Gerhard Munch who agrees with the treatment plan.    Dahlia Client Kemani Heidel, PA-C 10/21/13 2227

## 2013-10-21 NOTE — ED Notes (Signed)
Pt reports she was seen here for right wrist pain and states pain has gotten worse in the rt wrist since yesterday. Pt rates pain 9/10. Pt states she took the valium she was given and she can't do anything after they ran out. Pt states she can't get into physical therapy due to her having medicaid.

## 2013-10-21 NOTE — ED Notes (Signed)
The patient was seen here last night and was discharged.  She was given a resourde guide so that she could find a PT and received PT for her right hand.  The patient said she has medicaid they advised they did not take medicaid.  She is here because her wrist is a 9/10.

## 2013-10-21 NOTE — Discharge Instructions (Signed)
1. Medications: meloxicam, usual home medications 2. Treatment: rest, drink plenty of fluids, wear brace 3. Follow Up: Please followup with your primary doctor TOMORROW for discussion of your diagnoses and further evaluation after today's visit and then with neurology as directed; return to the ED for new facial droop, numbness, weakness or slurred speech    Radial Nerve Palsy Wrist drop is also known as radial nerve palsy. It is a condition in which you can not extend your wrist. This means if you are standing with your elbow bent at a right angle and with the top of your hand pointed at the ceiling, you can not hold your hand up. It falls toward the floor.  This action of extending your wrist is caused by the muscles in the back of your arm. These muscles are controlled by the radial nerve. This means that anything affecting the radial nerve so it can not tell the muscles how to work will cause wrist drop. This is medically called radial nerve palsy. Also the radial nerve is a motor and sensory nerve so anything affecting it causes problems with movement and feeling. CAUSES  Some more common causes of wrist drop are:  A break (fracture) of the large bone in the arm between your shoulder and your elbow (humerus). This is because the radial nerve winds around the humerus.  Improper use of crutches causes this because the radial nerve runs through the armpit (axilla). Crutches which are too long can put pressure on the nerve. This is sometimes called crutch palsy.  Falling asleep with your arm over a chair and supported on the back is a common cause. This is sometimes called Saturday Night Syndrome.  Wrist drop can be associated with lead poisoning because of the effect of lead on the radial nerve. SYMPTOMS  The wrist drop is an obvious problem, but there may also be numbness in the back of the arm, forearm or hand which provides feeling in these areas by the radial nerve. There can be difficulty  straightening out the elbow in addition to the wrist. There may be numbness, tingling, pain, burning sensations or other abnormal feelings. Symptoms depend entirely on where the radial nerve is injured. DIAGNOSIS   Wrist drop is obvious just by looking at it. Your caregiver may make the diagnosis by taking your history and doing a couple tests.  One test which may be done is a nerve conduction study. This test shows if the radial nerve is conducting signals well. If not, it can determine where the nerve problem is.  Sometimes X-ray studies are done. Your caregiver will determine if further testing needs to be done. TREATMENT   Usually if the problem is found to be pressure on the nerve, simply removing the pressure will allow the nerve to go back to normal in a few weeks to a few months. Other treatments will depend upon the cause found.  Only take over-the-counter or prescription medicines for pain, discomfort, or fever as directed by your caregiver.  Sometimes seizure medications are used.  Steroids are sometimes given to decrease swelling if it is thought to be a possible cause. Document Released: 11/18/2005 Document Revised: 06/06/2011 Document Reviewed: 05/29/2013 [facility] Patient Information 2015 [employer], [place]. This information is not intended to replace advice given to you by your health care provider. Make sure you discuss any questions you have with your health care provider.

## 2013-10-21 NOTE — ED Notes (Signed)
Discharge instructions reviewed with pt. Pt verbalized understanding.   

## 2013-10-22 ENCOUNTER — Ambulatory Visit (INDEPENDENT_AMBULATORY_CARE_PROVIDER_SITE_OTHER): Payer: Medicaid Other | Admitting: Family Medicine

## 2013-10-22 ENCOUNTER — Encounter: Payer: Self-pay | Admitting: Family Medicine

## 2013-10-22 VITALS — BP 107/71 | HR 97 | Ht 66.0 in | Wt 150.0 lb

## 2013-10-22 DIAGNOSIS — R29810 Facial weakness: Secondary | ICD-10-CM

## 2013-10-22 DIAGNOSIS — G5630 Lesion of radial nerve, unspecified upper limb: Secondary | ICD-10-CM | POA: Insufficient documentation

## 2013-10-22 DIAGNOSIS — G5631 Lesion of radial nerve, right upper limb: Secondary | ICD-10-CM

## 2013-10-22 MED ORDER — GABAPENTIN 100 MG PO CAPS: 100.0000 mg | ORAL_CAPSULE | Freq: Three times a day (TID) | ORAL | Status: DC

## 2013-10-22 NOTE — ED Provider Notes (Signed)
  Medical screening examination/treatment/procedure(s) were performed by non-physician practitioner and as supervising physician I was immediately available for consultation/collaboration.   EKG Interpretation None         Gerhard Munchobert Aaden Buckman, MD 10/22/13 [phone]

## 2013-10-22 NOTE — Patient Instructions (Signed)
I would like to order and MRI to look for a cause of your radial nerve palsy and facial droop. I am also ordering a neurology consult. I think the best thing for your pain would be gabapentin so I have sent in a prescription for that. I would also recommend that you get in to see Dr. [name] again soon to see whether he thinks that the wrist or neck surgery would help with this issue.  Thank you.

## 2013-10-23 ENCOUNTER — Telehealth: Payer: Self-pay | Admitting: *Deleted

## 2013-10-23 NOTE — Assessment & Plan Note (Addendum)
Exam not entirely explainable neurologically and somewhat inconsistent with ED exam, possible conversion given significant psych injury and husband with broken arm (same side), also w/ h/o cervical nerve root impingement and carpal tunnel on same side, combination of the two could potentially account for these deficits - given coincident facial droop, will obtain mri and referral to neurology - recommend patient see her orthopedist ASAP to discuss proceeding with surgery as previously discussed given this may be a progression of previously identified dysfunction - gabapentin for pain control (previously given mobic and norco by ED which hasn't helped)

## 2013-10-23 NOTE — Assessment & Plan Note (Addendum)
Intermittent, confirmed by multiple third parties, resolved - concern for MS given multiple seemingly unrelated neuro deficits - mri brain - neuro referral

## 2013-10-23 NOTE — Progress Notes (Signed)
   Subjective:    Patient ID: [name], female    DOB: [birth year], 38 y.o.   MRN: [id number]  HPI Pt presents for ED follow-up. On Sunday, pt awoke unable to move her right hand. She denies any trauma. This has never happened to her before. She presented to the ED where she had a normal head CT and was diagnosed with a radial nerve palsy and told to follow up with us for neurology referral. She also complains of electric shock-like pain radiating up that arm.   Her husband also reports that he and several coworkers noticed a facial droop, about two weeks ago, which lasted a day and then resolved. Her husband has noticed it a few times since then but it has always resolved within a few hours.   Review of Systems  Constitutional: Negative for fever.  Eyes: Negative for visual disturbance.  Musculoskeletal: Positive for neck pain.  Neurological: Positive for facial asymmetry, weakness and numbness. Negative for headaches.       Objective:   Physical Exam  Nursing note and vitals reviewed. Constitutional: She is oriented to person, place, and time. She appears well-developed and well-nourished. No distress.  HENT:  Head: Normocephalic and atraumatic.  Eyes: Conjunctivae and EOM are normal. Pupils are equal, round, and reactive to light. Right eye exhibits no discharge. Left eye exhibits no discharge. No scleral icterus.  Neck: Normal range of motion. Neck supple.  Cardiovascular: Normal rate, regular rhythm and normal heart sounds.   No murmur heard. Pulmonary/Chest: Effort normal and breath sounds normal. No respiratory distress. She has no wheezes.  Neurological: She is alert and oriented to person, place, and time. She displays no atrophy. No cranial nerve deficit or sensory deficit. She exhibits abnormal muscle tone. Seizure activity: Cranial Nerves:  II:  Peripheral visual fields grossly normal, pupils equal, round, reactive to light III,IV, VI: ptosis not present, extra-ocular  motions intact bilaterally  V,VII: smile symmetric, facial light touch sensation equal VIII: hearing gros. Coordination and gait normal.  Cranial Nerves:  II:  Peripheral visual fields grossly normal, pupils equal, round, reactive to light III,IV, VI: ptosis not present, extra-ocular motions intact bilaterally  V,VII: smile symmetric, facial light touch sensation equal VIII: hearing grossly normal bilaterally  IX,X: gag reflex present  XI: bilateral shoulder shrug equal and strong XII: midline tongue extension  Motor:  5/5 in the lower extremities bilaterally strong and equal dorsiflexion/plantar flexion 0/5 ROM and strength right wrist extension, 0/5 ROM and strength wrist flexion 0/5 ROM and strength in the right fingers flexion, 0/5 ROM and strength right finger extension, 5/5 ROM and strength of right pronation and suppination  5/5 strength in the LUE Sensory: Pinprick and light touch normal in all extremities.  Deep Tendon Reflexes: 2+ and symmetric  Cerebellar: normal finger-to-nose with bilateral upper extremities Gait: normal gait and balance   Skin: Skin is warm and dry. She is not diaphoretic.  Psychiatric: She has a normal mood and affect. Her behavior is normal.          Assessment & Plan:

## 2013-10-23 NOTE — Telephone Encounter (Signed)
Called and [facility] patient [facility] [address] Also sent referral to [facility], they will contact her with that appt.[name], [name]

## 2013-10-30 ENCOUNTER — Ambulatory Visit
Admission: RE | Admit: 2013-10-30 | Discharge: 2013-10-30 | Disposition: A | Discharge status: Home / Self Care | Payer: Medicaid Other | Source: Ambulatory Visit | Attending: Family Medicine | Admitting: Family Medicine

## 2013-10-30 DIAGNOSIS — G5631 Lesion of radial nerve, right upper limb: Secondary | ICD-10-CM

## 2013-10-30 DIAGNOSIS — R29810 Facial weakness: Secondary | ICD-10-CM

## 2013-10-30 MED ORDER — GADOBENATE DIMEGLUMINE 529 MG/ML IV SOLN
14.0000 mL | Freq: Once | INTRAVENOUS | Status: AC | PRN
  Administered 2013-10-30: 14 mL via INTRAVENOUS

## 2013-10-31 ENCOUNTER — Telehealth: Payer: Self-pay | Admitting: *Deleted

## 2013-10-31 NOTE — Telephone Encounter (Signed)
Patient informed of message from MD, expressed understanding. 

## 2013-10-31 NOTE — Telephone Encounter (Signed)
Left message with female for patient to call back for MRI results.

## 2013-10-31 NOTE — Telephone Encounter (Signed)
Message copied by Aram BeechamBENTON, ASHA F on Thu Oct 31, 2013  9:12 AM ------      Message from: Abram SanderADAMO, ELENA M      Created: Thu Oct 31, 2013  9:00 AM       Please inform patient that her MRI was completely normal. I recommend that she see the neurologist she is being referred to, as well as her orthopedist, to discuss next steps to control her symptoms. ------

## 2013-11-01 ENCOUNTER — Ambulatory Visit (INDEPENDENT_AMBULATORY_CARE_PROVIDER_SITE_OTHER): Payer: Medicaid Other | Admitting: Diagnostic Neuroimaging

## 2013-11-01 ENCOUNTER — Encounter: Payer: Self-pay | Admitting: Diagnostic Neuroimaging

## 2013-11-01 VITALS — BP 135/88 | HR 77 | Temp 98.5°F | Ht 66.0 in | Wt 154.6 lb

## 2013-11-01 DIAGNOSIS — R209 Unspecified disturbances of skin sensation: Secondary | ICD-10-CM

## 2013-11-01 DIAGNOSIS — R202 Paresthesia of skin: Secondary | ICD-10-CM

## 2013-11-01 DIAGNOSIS — R200 Anesthesia of skin: Secondary | ICD-10-CM

## 2013-11-01 DIAGNOSIS — M21339 Wrist drop, unspecified wrist: Secondary | ICD-10-CM

## 2013-11-01 DIAGNOSIS — M21331 Wrist drop, right wrist: Secondary | ICD-10-CM
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
## 2013-11-01 NOTE — Patient Instructions (Signed)
I will refer you to occupational therapy.
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
## 2013-12-16 ENCOUNTER — Ambulatory Visit: Payer: Medicaid Other | Admitting: Diagnostic Neuroimaging

## 2014-01-07 ENCOUNTER — Encounter (HOSPITAL_COMMUNITY): Payer: Self-pay | Admitting: Emergency Medicine

## 2014-01-07 ENCOUNTER — Emergency Department (HOSPITAL_COMMUNITY): Payer: Medicaid Other

## 2014-01-07 ENCOUNTER — Emergency Department (HOSPITAL_COMMUNITY)
Admission: EM | Admit: 2014-01-07 | Discharge: 2014-01-07 | Disposition: A | Discharge status: Home / Self Care | Payer: Medicaid Other | Attending: Emergency Medicine | Admitting: Emergency Medicine

## 2014-01-07 DIAGNOSIS — M797 Fibromyalgia: Secondary | ICD-10-CM | POA: Insufficient documentation

## 2014-01-07 DIAGNOSIS — S90111A Contusion of right great toe without damage to nail, initial encounter: Secondary | ICD-10-CM | POA: Diagnosis not present

## 2014-01-07 DIAGNOSIS — Z87440 Personal history of urinary (tract) infections: Secondary | ICD-10-CM | POA: Insufficient documentation

## 2014-01-07 DIAGNOSIS — J449 Chronic obstructive pulmonary disease, unspecified: Secondary | ICD-10-CM | POA: Insufficient documentation

## 2014-01-07 DIAGNOSIS — W208XXA Other cause of strike by thrown, projected or falling object, initial encounter: Secondary | ICD-10-CM | POA: Insufficient documentation

## 2014-01-07 DIAGNOSIS — F419 Anxiety disorder, unspecified: Secondary | ICD-10-CM | POA: Diagnosis not present

## 2014-01-07 DIAGNOSIS — Y9389 Activity, other specified: Secondary | ICD-10-CM | POA: Insufficient documentation

## 2014-01-07 DIAGNOSIS — Z87442 Personal history of urinary calculi: Secondary | ICD-10-CM | POA: Diagnosis not present

## 2014-01-07 DIAGNOSIS — Y9289 Other specified places as the place of occurrence of the external cause: Secondary | ICD-10-CM | POA: Insufficient documentation

## 2014-01-07 DIAGNOSIS — Z79899 Other long term (current) drug therapy: Secondary | ICD-10-CM | POA: Diagnosis not present

## 2014-01-07 DIAGNOSIS — Z8669 Personal history of other diseases of the nervous system and sense organs: Secondary | ICD-10-CM | POA: Insufficient documentation

## 2014-01-07 DIAGNOSIS — Z8679 Personal history of other diseases of the circulatory system: Secondary | ICD-10-CM | POA: Diagnosis not present

## 2014-01-07 DIAGNOSIS — Z8619 Personal history of other infectious and parasitic diseases: Secondary | ICD-10-CM | POA: Diagnosis not present

## 2014-01-07 DIAGNOSIS — Z792 Long term (current) use of antibiotics: Secondary | ICD-10-CM | POA: Diagnosis not present

## 2014-01-07 DIAGNOSIS — S90931A Unspecified superficial injury of right great toe, initial encounter: Secondary | ICD-10-CM | POA: Diagnosis present

## 2014-01-07 DIAGNOSIS — Z720 Tobacco use: Secondary | ICD-10-CM | POA: Insufficient documentation

## 2014-01-07 MED ORDER — HYDROCODONE-ACETAMINOPHEN 5-325 MG PO TABS
1.0000 | ORAL_TABLET | Freq: Once | ORAL | Status: AC
  Administered 2014-01-07: 1 via ORAL
  Filled 2014-01-07: qty 1

## 2014-01-07 MED ORDER — IBUPROFEN 800 MG PO TABS: 800.0000 mg | ORAL_TABLET | Freq: Three times a day (TID) | ORAL | Status: DC

## 2014-01-07 MED ORDER — HYDROCODONE-ACETAMINOPHEN 5-325 MG PO TABS: 1.0000 | ORAL_TABLET | ORAL | Status: DC | PRN

## 2014-01-07 NOTE — ED Notes (Signed)
Pt dropped an ashtray on her right great toe and has been having pain in that toe.

## 2014-01-07 NOTE — Discharge Instructions (Signed)
Contusion °A contusion is a deep bruise. Contusions are the result of an injury that caused bleeding under the skin. The contusion may turn blue, purple, or yellow. Minor injuries will give you a painless contusion, but more severe contusions may stay painful and swollen for a few weeks.  °CAUSES  °A contusion is usually caused by a blow, trauma, or direct force to an area of the body. °SYMPTOMS  °· Swelling and redness of the injured area. °· Bruising of the injured area. °· Tenderness and soreness of the injured area. °· Pain. °DIAGNOSIS  °The diagnosis can be made by taking a history and physical exam. An X-ray, CT scan, or MRI may be needed to determine if there were any associated injuries, such as fractures. °TREATMENT  °Specific treatment will depend on what area of the body was injured. In general, the best treatment for a contusion is resting, icing, elevating, and applying cold compresses to the injured area. Over-the-counter medicines may also be recommended for pain control. Ask your caregiver what the best treatment is for your contusion. °HOME CARE INSTRUCTIONS  °· Put ice on the injured area. °· Put ice in a plastic bag. °· Place a towel between your skin and the bag. °· Leave the ice on for 15-20 minutes, 3-4 times a day, or as directed by your health care provider. °· Only take over-the-counter or prescription medicines for pain, discomfort, or fever as directed by your caregiver. Your caregiver may recommend avoiding anti-inflammatory medicines (aspirin, ibuprofen, and naproxen) for 48 hours because these medicines may increase bruising. °· Rest the injured area. °· If possible, elevate the injured area to reduce swelling. °SEEK IMMEDIATE MEDICAL CARE IF:  °· You have increased bruising or swelling. °· You have pain that is getting worse. °· Your swelling or pain is not relieved with medicines. °MAKE SURE YOU:  °· Understand these instructions. °· Will watch your condition. °· Will get help right  away if you are not doing well or get worse. °Document Released: 12/22/2004 Document Revised: 03/19/2013 Document Reviewed: 01/17/2011 °ExitCare® Patient Information ©2015 [employer]. This information is not intended to replace advice given to you by your health care provider. Make sure you discuss any questions you have with your health care provider. ° °Cryotherapy °Cryotherapy means treatment with cold. Ice or gel packs can be used to reduce both pain and swelling. Ice is the most helpful within the first 24 to 48 hours after an injury or flare-up from overusing a muscle or joint. Sprains, strains, spasms, burning pain, shooting pain, and aches can all be eased with ice. Ice can also be used when recovering from surgery. Ice is effective, has very few side effects, and is safe for most people to use. °PRECAUTIONS  °Ice is not a safe treatment option for people with: °· Raynaud phenomenon. This is a condition affecting small blood vessels in the extremities. Exposure to cold may cause your problems to return. °· Cold hypersensitivity. There are many forms of cold hypersensitivity, including: °¨ Cold urticaria. Red, itchy hives appear on the skin when the tissues begin to warm after being iced. °¨ Cold erythema. This is a red, itchy rash caused by exposure to cold. °¨ Cold hemoglobinuria. Red blood cells break down when the tissues begin to warm after being iced. The hemoglobin that carry oxygen are passed into the urine because they cannot combine with blood proteins fast enough. °· Numbness or altered sensitivity in the area being iced. °If you have any   of the following conditions, do not use ice until you have discussed cryotherapy with your caregiver: °· Heart conditions, such as arrhythmia, angina, or chronic heart disease. °· High blood pressure. °· Healing wounds or open skin in the area being iced. °· Current infections. °· Rheumatoid arthritis. °· Poor circulation. °· Diabetes. °Ice slows the blood flow  in the region it is applied. This is beneficial when trying to stop inflamed tissues from spreading irritating chemicals to surrounding tissues. However, if you expose your skin to cold temperatures for too long or without the proper protection, you can damage your skin or nerves. Watch for signs of skin damage due to cold. °HOME CARE INSTRUCTIONS °Follow these tips to use ice and cold packs safely. °· Place a dry or damp towel between the ice and skin. A damp towel will cool the skin more quickly, so you may need to shorten the time that the ice is used. °· For a more rapid response, add gentle compression to the ice. °· Ice for no more than 10 to 20 minutes at a time. The bonier the area you are icing, the less time it will take to get the benefits of ice. °· Check your skin after 5 minutes to make sure there are no signs of a poor response to cold or skin damage. °· Rest 20 minutes or more between uses. °· Once your skin is numb, you can end your treatment. You can test numbness by very lightly touching your skin. The touch should be so light that you do not see the skin dimple from the pressure of your fingertip. When using ice, most people will feel these normal sensations in this order: cold, burning, aching, and numbness. °· Do not use ice on someone who cannot communicate their responses to pain, such as small children or people with dementia. °HOW TO MAKE AN ICE PACK °Ice packs are the most common way to use ice therapy. Other methods include ice massage, ice baths, and cryosprays. Muscle creams that cause a cold, tingly feeling do not offer the same benefits that ice offers and should not be used as a substitute unless recommended by your caregiver. °To make an ice pack, do one of the following: °· Place crushed ice or a bag of frozen vegetables in a sealable plastic bag. Squeeze out the excess air. Place this bag inside another plastic bag. Slide the bag into a pillowcase or place a damp towel between  your skin and the bag. °· Mix 3 parts water with 1 part rubbing alcohol. Freeze the mixture in a sealable plastic bag. When you remove the mixture from the freezer, it will be slushy. Squeeze out the excess air. Place this bag inside another plastic bag. Slide the bag into a pillowcase or place a damp towel between your skin and the bag. °SEEK MEDICAL CARE IF: °· You develop white spots on your skin. This may give the skin a blotchy (mottled) appearance. °· Your skin turns blue or pale. °· Your skin becomes waxy or hard. °· Your swelling gets worse. °MAKE SURE YOU:  °· Understand these instructions. °· Will watch your condition. °· Will get help right away if you are not doing well or get worse. °Document Released: 11/08/2010 Document Revised: 07/29/2013 Document Reviewed: 11/08/2010 °ExitCare® Patient Information ©2015 [employer]. This information is not intended to replace advice given to you by your health care provider. Make sure you discuss any questions you have with your health care provider. ° °

## 2014-01-07 NOTE — ED Provider Notes (Signed)
CSN: [id number]     Arrival date & time 01/07/14  [id number] History   First MD Initiated Contact with Patient 01/07/14 [phone]     Chief Complaint  Patient presents with  . Toe Pain     (Consider location/radiation/quality/duration/timing/severity/associated sxs/prior Treatment) Patient is a 38 y.o. female presenting with toe pain. The history is provided by the patient. No language interpreter was used.  Toe Pain This is a new problem. The current episode started yesterday. Pertinent negatives include no chills, fever or numbness. Associated symptoms comments: Right great toe pain after dropping a ceramic ashtray over 1st MTP last night causing continued pain..    Past Medical History  Diagnosis Date  . Asthma   . Emphysema   . COPD (chronic obstructive pulmonary disease)   . Fibromyalgia, primary   . Anxiety   . Depressed   . Nephrolithiasis   . UTI (lower urinary tract infection)   . Kidney stone   . Hepatitis C   . Dysrhythmia   . Fibromyalgia   . Carpal tunnel syndrome    Past Surgical History  Procedure Laterality Date  . Abdominal hysterectomy    . Orif ankle fracture  02/03/2012    Procedure: OPEN REDUCTION INTERNAL FIXATION (ORIF) ANKLE FRACTURE;  Surgeon: Nadara MustardMarcus V Duda, MD;  Location: [facility] OR;  Service: Orthopedics;  Laterality: Right;  Open Reduction Internal Fixation Right Ankle  . Tubal ligation    . Wisdom tooth extraction    . Orif clavicular fracture Left 08/29/2012    Dr [name]  . Orif clavicular fracture Left 08/29/2012    Procedure: OPEN REDUCTION INTERNAL FIXATION (ORIF) CLAVICULAR FRACTURE;  Surgeon: Eldred MangesMark C Yates, MD;  Location: [facility] OR;  Service: Orthopedics;  Laterality: Left;  Open Reduction Internal Fixation Left Clavicle Fracture   Family History  Problem Relation Age of Onset  . Alcohol abuse Father   . Cirrhosis Father   . Cancer Father   . Hyperthyroidism Sister   . Bipolar disorder Sister   . Kidney disease Brother   . Heart attack Mother   .  Hypertension Mother    History  Substance Use Topics  . Smoking status: Current Every Day Smoker -- 0.40 packs/day for 10 years    Types: Cigarettes  . Smokeless tobacco: Never Used  . Alcohol Use: Yes     Comment: occasionally   OB History   Grav Para Term Preterm Abortions TAB SAB Ect Mult Living                 Obstetric Comments   S/p hysterectomy     Review of Systems  Constitutional: Negative for fever and chills.  Musculoskeletal:       See HPI.  Skin: Negative.  Negative for wound.  Neurological: Negative.  Negative for numbness.      Allergies  Eggs or egg-derived products; Milk-related compounds; and Sulfa antibiotics  Home Medications   Prior to Admission medications   Medication Sig Start Date End Date Taking? Authorizing Provider  albuterol (PROVENTIL HFA;VENTOLIN HFA) 108 (90 BASE) MCG/ACT inhaler Inhale 2 puffs into the lungs every 4 (four) hours as needed for wheezing. 02/20/12   Latrelle DodrillBrittany J McIntyre, MD  CALCIUM PO Take 1 tablet by mouth daily.    Historical Provider, MD  fish oil-omega-3 fatty acids 1000 MG capsule Take 1 g by mouth 2 (two) times daily.    Historical Provider, MD  gabapentin (NEURONTIN) 100 MG capsule Take 1 capsule (100 mg total) by mouth 3 (  three) times daily. 10/22/13   Abram SanderElena M Adamo, MD  ibuprofen (ADVIL,MOTRIN) 200 MG tablet Take 400 mg by mouth every 6 (six) hours as needed for headache (pain).    Historical Provider, MD  meloxicam (MOBIC) 15 MG tablet Take 1 tablet (15 mg total) by mouth daily. 10/21/13   Hannah Muthersbaugh, PA-C  Multiple Vitamin (MULTIVITAMIN WITH MINERALS) TABS Take 1 tablet by mouth daily.    Historical Provider, MD  vitamin B-12 (CYANOCOBALAMIN) 1000 MCG tablet Take 1,000 mcg by mouth daily.    Historical Provider, MD   BP 139/91  Pulse 90  Temp(Src) 98.4 F (36.9 C) (Oral)  Resp 20  SpO2 99% Physical Exam  Constitutional: She is oriented to person, place, and time. She appears well-developed and  well-nourished.  Neck: Normal range of motion.  Pulmonary/Chest: Effort normal.  Musculoskeletal:  Mild bruising of 1st right MTP without bony deformity. Tender to any palpation. Distal neurosensory intact.   Neurological: She is alert and oriented to person, place, and time.  Skin: Skin is warm and dry.    ED Course  Procedures (including critical care time) Labs Review Labs Reviewed - No data to display  Imaging Review Dg Toe Great Right  01/07/2014   CLINICAL DATA:  Patient dropped ceramic Ash tray on to big toe this morning, pain  EXAM: RIGHT GREAT TOE  COMPARISON:  None.  FINDINGS: There is no evidence of fracture or dislocation. There is no evidence of arthropathy or other focal bone abnormality. Soft tissues are unremarkable.  IMPRESSION: No acute osseous injury of the right great toe.   Electronically Signed   By: Elige KoHetal  Patel   On: 01/07/2014 10:31     EKG Interpretation None      MDM   Final diagnoses:  None    1. Contusion right great toe  Wooden shoe and crutches provided for comfort. Pain control. Supportive care, ortho referral if pain is no better.    Arnoldo HookerShari A Alam Guterrez, PA-C 01/07/14 1054

## 2014-01-08 NOTE — ED Provider Notes (Signed)
Medical screening examination/treatment/procedure(s) were performed by non-physician practitioner and as supervising physician I was immediately available for consultation/collaboration.   EKG Interpretation None        Vicent Febles M Fortune Torosian, MD 01/08/14 1946 

## 2014-01-27 ENCOUNTER — Encounter (HOSPITAL_COMMUNITY): Payer: Self-pay | Admitting: Emergency Medicine

## 2014-03-05 ENCOUNTER — Encounter (HOSPITAL_COMMUNITY): Payer: Self-pay | Admitting: *Deleted

## 2014-03-05 ENCOUNTER — Emergency Department (HOSPITAL_COMMUNITY): Payer: Medicaid Other

## 2014-03-05 ENCOUNTER — Emergency Department (HOSPITAL_COMMUNITY)
Admission: EM | Admit: 2014-03-05 | Discharge: 2014-03-05 | Disposition: A | Discharge status: Home / Self Care | Payer: Medicaid Other | Attending: Emergency Medicine | Admitting: Emergency Medicine

## 2014-03-05 DIAGNOSIS — Z8669 Personal history of other diseases of the nervous system and sense organs: Secondary | ICD-10-CM | POA: Diagnosis not present

## 2014-03-05 DIAGNOSIS — Z720 Tobacco use: Secondary | ICD-10-CM | POA: Diagnosis not present

## 2014-03-05 DIAGNOSIS — Z79899 Other long term (current) drug therapy: Secondary | ICD-10-CM | POA: Diagnosis not present

## 2014-03-05 DIAGNOSIS — Y9389 Activity, other specified: Secondary | ICD-10-CM | POA: Insufficient documentation

## 2014-03-05 DIAGNOSIS — S299XXA Unspecified injury of thorax, initial encounter: Secondary | ICD-10-CM | POA: Diagnosis present

## 2014-03-05 DIAGNOSIS — Z87442 Personal history of urinary calculi: Secondary | ICD-10-CM | POA: Diagnosis not present

## 2014-03-05 DIAGNOSIS — Z791 Long term (current) use of non-steroidal anti-inflammatories (NSAID): Secondary | ICD-10-CM | POA: Insufficient documentation

## 2014-03-05 DIAGNOSIS — J449 Chronic obstructive pulmonary disease, unspecified: Secondary | ICD-10-CM | POA: Diagnosis not present

## 2014-03-05 DIAGNOSIS — Y9289 Other specified places as the place of occurrence of the external cause: Secondary | ICD-10-CM | POA: Diagnosis not present

## 2014-03-05 DIAGNOSIS — M797 Fibromyalgia: Secondary | ICD-10-CM | POA: Insufficient documentation

## 2014-03-05 DIAGNOSIS — R0781 Pleurodynia: Secondary | ICD-10-CM

## 2014-03-05 DIAGNOSIS — S20212A Contusion of left front wall of thorax, initial encounter: Secondary | ICD-10-CM

## 2014-03-05 DIAGNOSIS — F329 Major depressive disorder, single episode, unspecified: Secondary | ICD-10-CM | POA: Insufficient documentation

## 2014-03-05 DIAGNOSIS — Z8619 Personal history of other infectious and parasitic diseases: Secondary | ICD-10-CM | POA: Diagnosis not present

## 2014-03-05 DIAGNOSIS — S2020XA Contusion of thorax, unspecified, initial encounter: Secondary | ICD-10-CM | POA: Insufficient documentation

## 2014-03-05 DIAGNOSIS — Z87440 Personal history of urinary (tract) infections: Secondary | ICD-10-CM | POA: Diagnosis not present

## 2014-03-05 DIAGNOSIS — Y998 Other external cause status: Secondary | ICD-10-CM | POA: Diagnosis not present

## 2014-03-05 DIAGNOSIS — F419 Anxiety disorder, unspecified: Secondary | ICD-10-CM | POA: Diagnosis not present

## 2014-03-05 DIAGNOSIS — W010XXA Fall on same level from slipping, tripping and stumbling without subsequent striking against object, initial encounter: Secondary | ICD-10-CM | POA: Diagnosis not present

## 2014-03-05 MED ORDER — TRAMADOL HCL 50 MG PO TABS: 50.0000 mg | ORAL_TABLET | Freq: Four times a day (QID) | ORAL | Status: DC | PRN

## 2014-03-05 MED ORDER — OXYCODONE-ACETAMINOPHEN 5-325 MG PO TABS
2.0000 | ORAL_TABLET | Freq: Once | ORAL | Status: AC
  Administered 2014-03-05: 2 via ORAL
  Filled 2014-03-05: qty 2

## 2014-03-05 NOTE — Discharge Instructions (Signed)
No fractures on Xray. No other abnormalities.  Home care instructions:  Put ice on the injured area.  Put ice in a plastic bag.  Place a towel between your skin and the bag.  Leave the ice on for 15-20 minutes, 03-04 times a day.  Only take over-the-counter or prescription medicines as directed by your caregiver. Your caregiver may recommend avoiding anti-inflammatory medicines (aspirin, ibuprofen, and naproxen) for 48 hours because these medicines may increase bruising.  Rest the injured area.  Perform deep-breathing exercises as directed by your caregiver.  Stop smoking if you smoke.  Do not lift objects over 5 pounds (2.3 kg) for 3 days or longer if recommended by your caregiver.   Chest Contusion A chest contusion is a deep bruise on your chest area. Contusions are the result of an injury that caused bleeding under the skin. A chest contusion may involve bruising of the skin, muscles, or ribs. The contusion may turn blue, purple, or yellow. Minor injuries will give you a painless contusion, but more severe contusions may stay painful and swollen for a few weeks. CAUSES  A contusion is usually caused by a blow, trauma, or direct force to an area of the body. SYMPTOMS   Swelling and redness of the injured area.  Discoloration of the injured area.  Tenderness and soreness of the injured area.  Pain. DIAGNOSIS  The diagnosis can be made by taking a history and performing a physical exam. An X-ray, CT scan, or MRI may be needed to determine if there were any associated injuries, such as broken bones (fractures) or internal injuries. TREATMENT  Often, the best treatment for a chest contusion is resting, icing, and applying cold compresses to the injured area. Deep breathing exercises may be recommended to reduce the risk of pneumonia. Over-the-counter medicines may also be recommended for pain control.  SEEK IMMEDIATE MEDICAL CARE IF:  1. You have increased bruising or  swelling. 2. You have pain that is getting worse. 3. You have difficulty breathing. 4. You have dizziness, weakness, or fainting. 5. You have blood in your urine or stool. 6. You cough up or vomit blood. 7. Your swelling or pain is not relieved with medicines. MAKE SURE YOU:   Understand these instructions.  Will watch your condition.  Will get help right away if you are not doing well or get worse. Document Released: 12/07/2000 Document Revised: 12/07/2011 Document Reviewed: 09/05/2011 [facility] Patient Information 2015 [place], [place]. This information is not intended to replace advice given to you by your health care provider. Make sure you discuss any questions you have with your health care provider. Incentive Spirometer An incentive spirometer is a tool that can help keep your lungs clear and active. This tool measures how well you are filling your lungs with each breath. Taking long, deep breaths may help reverse or decrease the chance of developing breathing (pulmonary) problems (especially infection) following:  Surgery of the chest or abdomen.  Surgery if you have a history of smoking or a lung problem.  A long period of time when you are unable to move or be active. BEFORE THE PROCEDURE   If the spirometer includes an indicator to show your best effort, your nurse or respiratory therapist will set it to a desired goal.  If possible, sit up straight or lean slightly forward. Try not to slouch.  Hold the incentive spirometer in an upright position. INSTRUCTIONS FOR USE  8. Sit on the edge of your bed if possible, or  sit up as far as you can in bed or on a chair. 9. Hold the incentive spirometer in an upright position. 10. Breathe out normally. 11. Place the mouthpiece in your mouth and seal your lips tightly around it. 12. Breathe in slowly and as deeply as possible, raising the piston or the ball toward the top of the column. 13. Hold your breath for 3-5 seconds or for  as long as possible. Allow the piston or ball to fall to the bottom of the column. 14. Remove the mouthpiece from your mouth and breathe out normally. 15. Rest for a few seconds and repeat Steps 1 through 7 at least 10 times every 1-2 hours when you are awake. Take your time and take a few normal breaths between deep breaths. 16. The spirometer may include an indicator to show your best effort. Use the indicator as a goal to work toward during each repetition. 17. After each set of 10 deep breaths, practice coughing to be sure your lungs are clear. If you have an incision (the cut made at the time of surgery), support your incision when coughing by placing a pillow or rolled-up towels firmly against it. Once you are able to get out of bed, walk around indoors and cough well. You may stop using the incentive spirometer when instructed by your caregiver.  RISKS AND COMPLICATIONS  Breathing too quickly may cause dizziness. At an extreme, this could cause you to pass out. Take your time so you do not get dizzy or light-headed.  If you are in pain, you may need to take or ask for pain medication before doing incentive spirometry. It is harder to take a deep breath if you are having pain. AFTER USE  Rest and breathe slowly and easily.  It can be helpful to keep a log of your progress. Your caregiver can provide you with a simple table to help with this. If you are using the spirometer at home, follow these instructions: SEEK MEDICAL CARE IF:   You are having difficultly using the spirometer.  You have trouble using the spirometer as often as instructed.  Your pain medication is not giving enough relief while using the spirometer.  You develop fever of 100.55F (38.1C) or higher. SEEK IMMEDIATE MEDICAL CARE IF:   You cough up bloody sputum that had not been present before.  You develop fever of 102F (38.9C) or greater.  You develop worsening pain at or near the incision site. MAKE SURE  YOU:   Understand these instructions.  Will watch your condition.  Will get help right away if you are not doing well or get worse. Document Released: 07/25/2006 Document Revised: 07/29/2013 Document Reviewed: 09/25/2006 [facility]ExitCare Patient Information 2015 [place], [place]. This information is not intended to replace advice given to you by your health care provider. Make sure you discuss any questions you have with your health care provider.

## 2014-03-05 NOTE — ED Notes (Signed)
Pt reports falling onto a step last night and landed on left ribs and now having pain with movement, breathing, coughing. Airway intact.

## 2014-03-05 NOTE — ED Provider Notes (Signed)
CSN: [id number]     Arrival date & time 03/05/14  0912 History   First MD Initiated Contact with Patient 03/05/14 [phone]     Chief Complaint  Patient presents with  . Fall     (Consider location/radiation/quality/duration/timing/severity/associated sxs/prior Treatment) Patient is a 38 y.o. female presenting with fall. The history is provided by the patient. No language interpreter was used.  Fall This is a new problem. The current episode started yesterday. The problem has been gradually worsening. Associated symptoms include chest pain (Rib pain L). Pertinent negatives include no abdominal pain, coughing, headaches or nausea. The symptoms are aggravated by coughing, twisting and exertion. She has tried nothing for the symptoms.   paient slipped and fell on her Left side last nigh on the edge of a step . C/o Left rib pain. Worse with deep breathing  Past Medical History  Diagnosis Date  . Asthma   . Emphysema   . COPD (chronic obstructive pulmonary disease)   . Fibromyalgia, primary   . Anxiety   . Depressed   . Nephrolithiasis   . UTI (lower urinary tract infection)   . Kidney stone   . Hepatitis C   . Dysrhythmia   . Fibromyalgia   . Carpal tunnel syndrome    Past Surgical History  Procedure Laterality Date  . Abdominal hysterectomy    . Orif ankle fracture  02/03/2012    Procedure: OPEN REDUCTION INTERNAL FIXATION (ORIF) ANKLE FRACTURE;  Surgeon: Nadara MustardMarcus V Duda, MD;  Location: [facility] OR;  Service: Orthopedics;  Laterality: Right;  Open Reduction Internal Fixation Right Ankle  . Tubal ligation    . Wisdom tooth extraction    . Orif clavicular fracture Left 08/29/2012    Dr [name]  . Orif clavicular fracture Left 08/29/2012    Procedure: OPEN REDUCTION INTERNAL FIXATION (ORIF) CLAVICULAR FRACTURE;  Surgeon: Eldred MangesMark C Yates, MD;  Location: [facility] OR;  Service: Orthopedics;  Laterality: Left;  Open Reduction Internal Fixation Left Clavicle Fracture   Family History  Problem Relation Age of  Onset  . Alcohol abuse Father   . Cirrhosis Father   . Cancer Father   . Hyperthyroidism Sister   . Bipolar disorder Sister   . Kidney disease Brother   . Heart attack Mother   . Hypertension Mother    History  Substance Use Topics  . Smoking status: Current Every Day Smoker -- 0.40 packs/day for 10 years    Types: Cigarettes  . Smokeless tobacco: Never Used  . Alcohol Use: Yes     Comment: occasionally   OB History    Obstetric Comments   S/p hysterectomy     Review of Systems  Respiratory: Negative for cough.   Cardiovascular: Positive for chest pain (Rib pain L).  Gastrointestinal: Negative for nausea and abdominal pain.  Neurological: Negative for headaches.      Allergies  Eggs or egg-derived products; Milk-related compounds; and Sulfa antibiotics  Home Medications   Prior to Admission medications   Medication Sig Start Date End Date Taking? Authorizing Provider  albuterol (PROVENTIL HFA;VENTOLIN HFA) 108 (90 BASE) MCG/ACT inhaler Inhale 2 puffs into the lungs every 4 (four) hours as needed for wheezing. 02/20/12  Yes Latrelle DodrillBrittany J McIntyre, MD  ibuprofen (ADVIL,MOTRIN) 800 MG tablet Take 1 tablet (800 mg total) by mouth 3 (three) times daily. 01/07/14  Yes Shari A Upstill, PA-C  venlafaxine (EFFEXOR) 75 MG tablet Take 225 mg by mouth daily.   Yes Historical Provider, MD  gabapentin (NEURONTIN) 100  MG capsule Take 1 capsule (100 mg total) by mouth 3 (three) times daily. Patient not taking: Reported on 03/05/2014 10/22/13   Abram SanderElena M Adamo, MD  HYDROcodone-acetaminophen (NORCO/VICODIN) 5-325 MG per tablet Take 1-2 tablets by mouth every 4 (four) hours as needed. Patient not taking: Reported on 03/05/2014 01/07/14   Melvenia BeamShari A Upstill, PA-C  meloxicam (MOBIC) 15 MG tablet Take 1 tablet (15 mg total) by mouth daily. Patient not taking: Reported on 03/05/2014 10/21/13   Dahlia ClientHannah Muthersbaugh, PA-C   BP 117/78 mmHg  Pulse 105  Temp(Src) 98 F (36.7 C) (Oral)  Resp 20  SpO2  100% Physical Exam  Constitutional: She is oriented to person, place, and time. She appears well-developed and well-nourished. No distress.  Tearful  HENT:  Head: Normocephalic and atraumatic.  Eyes: Conjunctivae are normal. No scleral icterus.  Neck: Normal range of motion.  Cardiovascular: Normal rate, regular rhythm and normal heart sounds.  Exam reveals no gallop and no friction rub.   No murmur heard. Pulmonary/Chest: Effort normal and breath sounds normal. No respiratory distress. She exhibits tenderness.    ttp L chest wall. Small amount fo bruising. No step off, crepitus, flail chest.   Abdominal: Soft. Bowel sounds are normal. She exhibits no distension and no mass. There is no tenderness. There is no guarding.  Neurological: She is alert and oriented to person, place, and time.  Skin: Skin is warm and dry. She is not diaphoretic.  Nursing note and vitals reviewed.   ED Course  Procedures (including critical care time) Labs Review Labs Reviewed - No data to display  Imaging Review No results found.   EKG Interpretation None      MDM   Final diagnoses:  Rib pain on left side  Rib contusion, left, initial encounter   Patient with negative chest x-ray. Pain to palpation. Some bruising. Diagnosis of rib contusion. No underlying signs of pulmonary contusion. Patient will be discharged with tramadol and incentive spirometer. Follow up with primary care physician.    Arthor Captainbigail Yaser Harvill, PA-C 03/05/14 1154  Richardean Canalavid H Yao, MD 03/06/14 [phone]

## 2014-03-08 ENCOUNTER — Emergency Department (HOSPITAL_COMMUNITY): Payer: Medicaid Other

## 2014-03-08 ENCOUNTER — Emergency Department (HOSPITAL_COMMUNITY)
Admission: EM | Admit: 2014-03-08 | Discharge: 2014-03-08 | Disposition: A | Discharge status: Home / Self Care | Payer: Medicaid Other | Attending: Emergency Medicine | Admitting: Emergency Medicine

## 2014-03-08 ENCOUNTER — Encounter (HOSPITAL_COMMUNITY): Payer: Self-pay | Admitting: Emergency Medicine

## 2014-03-08 DIAGNOSIS — F419 Anxiety disorder, unspecified: Secondary | ICD-10-CM | POA: Insufficient documentation

## 2014-03-08 DIAGNOSIS — Y998 Other external cause status: Secondary | ICD-10-CM | POA: Insufficient documentation

## 2014-03-08 DIAGNOSIS — Y9289 Other specified places as the place of occurrence of the external cause: Secondary | ICD-10-CM | POA: Insufficient documentation

## 2014-03-08 DIAGNOSIS — Z8619 Personal history of other infectious and parasitic diseases: Secondary | ICD-10-CM | POA: Diagnosis not present

## 2014-03-08 DIAGNOSIS — J449 Chronic obstructive pulmonary disease, unspecified: Secondary | ICD-10-CM | POA: Diagnosis not present

## 2014-03-08 DIAGNOSIS — S63619A Unspecified sprain of unspecified finger, initial encounter: Secondary | ICD-10-CM

## 2014-03-08 DIAGNOSIS — Z791 Long term (current) use of non-steroidal anti-inflammatories (NSAID): Secondary | ICD-10-CM | POA: Insufficient documentation

## 2014-03-08 DIAGNOSIS — Z79899 Other long term (current) drug therapy: Secondary | ICD-10-CM | POA: Insufficient documentation

## 2014-03-08 DIAGNOSIS — F329 Major depressive disorder, single episode, unspecified: Secondary | ICD-10-CM | POA: Insufficient documentation

## 2014-03-08 DIAGNOSIS — S20212D Contusion of left front wall of thorax, subsequent encounter: Secondary | ICD-10-CM | POA: Diagnosis not present

## 2014-03-08 DIAGNOSIS — Y9389 Activity, other specified: Secondary | ICD-10-CM | POA: Insufficient documentation

## 2014-03-08 DIAGNOSIS — S63614A Unspecified sprain of right ring finger, initial encounter: Secondary | ICD-10-CM | POA: Insufficient documentation

## 2014-03-08 DIAGNOSIS — W19XXXA Unspecified fall, initial encounter: Secondary | ICD-10-CM

## 2014-03-08 DIAGNOSIS — Z8669 Personal history of other diseases of the nervous system and sense organs: Secondary | ICD-10-CM | POA: Diagnosis not present

## 2014-03-08 DIAGNOSIS — W1830XA Fall on same level, unspecified, initial encounter: Secondary | ICD-10-CM | POA: Insufficient documentation

## 2014-03-08 DIAGNOSIS — S8392XA Sprain of unspecified site of left knee, initial encounter: Secondary | ICD-10-CM | POA: Diagnosis not present

## 2014-03-08 DIAGNOSIS — Z87440 Personal history of urinary (tract) infections: Secondary | ICD-10-CM | POA: Diagnosis not present

## 2014-03-08 DIAGNOSIS — Z87442 Personal history of urinary calculi: Secondary | ICD-10-CM | POA: Diagnosis not present

## 2014-03-08 DIAGNOSIS — Z720 Tobacco use: Secondary | ICD-10-CM | POA: Diagnosis not present

## 2014-03-08 DIAGNOSIS — S8992XA Unspecified injury of left lower leg, initial encounter: Secondary | ICD-10-CM | POA: Diagnosis present

## 2014-03-08 MED ORDER — IBUPROFEN 800 MG PO TABS: 800.0000 mg | ORAL_TABLET | Freq: Three times a day (TID) | ORAL | Status: DC

## 2014-03-08 MED ORDER — HYDROCODONE-ACETAMINOPHEN 5-325 MG PO TABS: ORAL_TABLET | ORAL | Status: DC

## 2014-03-08 NOTE — ED Notes (Signed)
PT reports right hand pain and left knee pain from fall on 03/04/14 and was seen in ED that day and reports having a negative chest xray but no others and states she was told to come back to ED for pain relief and further eval from nurse at [facility].

## 2014-03-08 NOTE — Discharge Instructions (Signed)
Knee Pain Knee pain can be a result of an injury or other medical conditions. Treatment will depend on the cause of your pain. HOME CARE  Only take medicine as told by your doctor.  Keep a healthy weight. Being overweight can make the knee hurt more.  Stretch before exercising or playing sports.  If there is constant knee pain, change the way you exercise. Ask your doctor for advice.  Make sure shoes fit well. Choose the right shoe for the sport or activity.  Protect your knees. Wear kneepads if needed.  Rest when you are tired. GET HELP RIGHT AWAY IF:   Your knee pain does not stop.  Your knee pain does not get better.  Your knee joint feels hot to the touch.  You have a fever. MAKE SURE YOU:   Understand these instructions.  Will watch this condition.  Will get help right away if you are not doing well or get worse. Document Released: 06/10/2008 Document Revised: 06/06/2011 Document Reviewed: 06/10/2008 [facility] NorthwestExitCare Patient Information 2015 [place]ExitCare, [place]LL[place]. This information is not intended to replace advice given to you by your health care provider. Make sure you discuss any questions you have with your health care provider.  Rib Contusion A rib contusion (bruise) can occur by a blow to the chest or by a fall against a hard object. Usually these will be much better in a couple weeks. If X-rays were taken today and there are no broken bones (fractures), the diagnosis of bruising is made. However, broken ribs may not show up for several days, or may be discovered later on a routine X-ray when signs of healing show up. If this happens to you, it does not mean that something was missed on the X-ray, but simply that it did not show up on the first X-rays. Earlier diagnosis will not usually change the treatment. HOME CARE INSTRUCTIONS   Avoid strenuous activity. Be careful during activities and avoid bumping the injured ribs. Activities that pull on the injured ribs and cause pain  should be avoided, if possible.  For the first day or two, an ice pack used every 20 minutes while awake may be helpful. Put ice in a plastic bag and put a towel between the bag and the skin.  Eat a normal, well-balanced diet. Drink plenty of fluids to avoid constipation.  Take deep breaths several times a day to keep lungs free of infection. Try to cough several times a day. Splint the injured area with a pillow while coughing to ease pain. Coughing can help prevent pneumonia.  Wear a rib belt or binder only if told to do so by your caregiver. If you are wearing a rib belt or binder, you must do the breathing exercises as directed by your caregiver. If not used properly, rib belts or binders restrict breathing which can lead to pneumonia.  Only take over-the-counter or prescription medicines for pain, discomfort, or fever as directed by your caregiver. SEEK MEDICAL CARE IF:   You or your child has an oral temperature above 102 F (38.9 C).  Your baby is older than 3 months with a rectal temperature of 100.5 F (38.1 C) or higher for more than 1 day.  You develop a cough, with thick or bloody sputum. SEEK IMMEDIATE MEDICAL CARE IF:   You have difficulty breathing.  You feel sick to your stomach (nausea), have vomiting or belly (abdominal) pain.  You have worsening pain, not controlled with medications, or there is a change  in the location of the pain.  You develop sweating or radiation of the pain into the arms, jaw or shoulders, or become light headed or faint.  You or your child has an oral temperature above 102 F (38.9 C), not controlled by medicine.  Your or your baby is older than 3 months with a rectal temperature of 102 F (38.9 C) or higher.  Your baby is 713 months old or younger with a rectal temperature of 100.4 F (38 C) or higher. MAKE SURE YOU:   Understand these instructions.  Will watch your condition.  Will get help right away if you are not doing well or  get worse. Document Released: 12/07/2000 Document Revised: 07/09/2012 Document Reviewed: 10/31/2007 [facility]Exit[facility] Patient Information 2015 [place]ExitCare, [place]LL[place]. This information is not intended to replace advice given to you by your health care provider. Make sure you discuss any questions you have with your health care provider.

## 2014-03-10 NOTE — ED Provider Notes (Signed)
CSN: [id number]     Arrival date & time 03/08/14  1544 History   First MD Initiated Contact with Patient 03/08/14 1656     Chief Complaint  Patient presents with  . Fall     (Consider location/radiation/quality/duration/timing/severity/associated sxs/prior Treatment) HPI  [name] is a 38 y.o. female who presents to the Emergency Department complaining of persistent left rib pain after a fall several days ago.  She states that [facility] at the [facility] of the injury.  Treated with tramadol which she reports continued pain that is worse with movement and deep breathing.  She states that [facility] and advised to come back in for further evaluation of her pain.  She denies abdominal pain, fever, vomiting or shortness of breath.    Past Medical History  Diagnosis Date  . Asthma   . Emphysema   . COPD (chronic obstructive pulmonary disease)   . Fibromyalgia, primary   . Anxiety   . Depressed   . Nephrolithiasis   . UTI (lower urinary tract infection)   . Kidney stone   . Hepatitis C   . Dysrhythmia   . Fibromyalgia   . Carpal tunnel syndrome    Past Surgical History  Procedure Laterality Date  . Abdominal hysterectomy    . Orif ankle fracture  02/03/2012    Procedure: OPEN REDUCTION INTERNAL FIXATION (ORIF) ANKLE FRACTURE;  Surgeon: Nadara MustardMarcus V Duda, MD;  Location: [facility] OR;  Service: Orthopedics;  Laterality: Right;  Open Reduction Internal Fixation Right Ankle  . Tubal ligation    . Wisdom tooth extraction    . Orif clavicular fracture Left 08/29/2012    Dr [name]  . Orif clavicular fracture Left 08/29/2012    Procedure: OPEN REDUCTION INTERNAL FIXATION (ORIF) CLAVICULAR FRACTURE;  Surgeon: Eldred MangesMark C Yates, MD;  Location: [facility] OR;  Service: Orthopedics;  Laterality: Left;  Open Reduction Internal Fixation Left Clavicle Fracture   Family History  Problem Relation Age of Onset  . Alcohol abuse Father   . Cirrhosis Father   . Cancer Father   .  Hyperthyroidism Sister   . Bipolar disorder Sister   . Kidney disease Brother   . Heart attack Mother   . Hypertension Mother    History  Substance Use Topics  . Smoking status: Current Every Day Smoker -- 0.40 packs/day for 10 years    Types: Cigarettes  . Smokeless tobacco: Never Used  . Alcohol Use: Yes     Comment: occasionally   OB History    Obstetric Comments   S/p hysterectomy     Review of Systems  Constitutional: Negative for fever and chills.  Respiratory: Negative for cough and shortness of breath.   Cardiovascular: Positive for chest pain.       Left rib pain  Gastrointestinal: Negative for nausea and vomiting.  Genitourinary: Negative for dysuria, hematuria, flank pain and difficulty urinating.  Musculoskeletal: Positive for joint swelling and arthralgias. Negative for back pain and neck pain.  Skin: Negative for color change and wound.  Neurological: Negative for dizziness, weakness, numbness and headaches.  All other systems reviewed and are negative.     Allergies  Eggs or egg-derived products; Milk-related compounds; and Sulfa antibiotics  Home Medications   Prior to Admission medications   Medication Sig Start Date End Date Taking? Authorizing Provider  albuterol (PROVENTIL HFA;VENTOLIN HFA) 108 (90 BASE) MCG/ACT inhaler Inhale 2 puffs into the lungs every 4 (four) hours as  needed for wheezing. 02/20/12   Latrelle DodrillBrittany J McIntyre, MD  gabapentin (NEURONTIN) 100 MG capsule Take 1 capsule (100 mg total) by mouth 3 (three) times daily. Patient not taking: Reported on 03/05/2014 10/22/13   Abram SanderElena M Adamo, MD  HYDROcodone-acetaminophen (NORCO/VICODIN) 5-325 MG per tablet Take one-two tabs po q 4-6 hrs prn pain 03/08/14   Maya Arcand L. Yadier Bramhall, PA-C  ibuprofen (ADVIL,MOTRIN) 800 MG tablet Take 1 tablet (800 mg total) by mouth 3 (three) times daily. Take with food 03/08/14   Geffrey Michaelsen L. Chaz Ronning, PA-C  traMADol (ULTRAM) 50 MG tablet Take 1 tablet (50 mg total) by mouth  every 6 (six) hours as needed. 03/05/14   Arthor CaptainAbigail Harris, PA-C  venlafaxine (EFFEXOR) 75 MG tablet Take 225 mg by mouth daily.    Historical Provider, MD   BP 125/92 mmHg  Pulse 69  Temp(Src) 98.6 F (37 C) (Oral)  Resp 16  Ht 5\' 6"  (1.676 m)  Wt 157 lb (71.215 kg)  BMI 25.35 kg/m2  SpO2 96% Physical Exam  Constitutional: She is oriented to person, place, and time. She appears well-developed and well-nourished. No distress.  HENT:  Head: Normocephalic and atraumatic.  Neck: Normal range of motion. Neck supple.  Cardiovascular: Normal rate, regular rhythm, normal heart sounds and intact distal pulses.   No murmur heard. Pulmonary/Chest: Effort normal and breath sounds normal. She exhibits tenderness.  ttp of the lower anterior to lateral left chest wall.  No bruising or crepitus on exam.  No bony deformity noted  Abdominal: Soft. She exhibits no distension and no mass. There is no tenderness. There is no rebound and no guarding.  Musculoskeletal: Normal range of motion.  Lymphadenopathy:    She has no cervical adenopathy.  Neurological: She is alert and oriented to person, place, and time. She exhibits normal muscle tone. Coordination normal.  Skin: Skin is warm and dry.    ED Course  Procedures (including critical care time) Labs Review Labs Reviewed - No data to display  Imaging Review No results found.   EKG Interpretation None      MDM   Final diagnoses:  Rib contusion, left, subsequent encounter  Finger sprain, initial encounter  Knee sprain, left, initial encounter    patient was seen and evaluated at time of the fall and had a neg CXR, continues to have left rib pain without crepitus or guarding on exam.  abd remains soft, NT.  Pain not relieved with tramadol.  No concerning sx's for pulmonary contusion.  Will rx vicodin and motrin.  Pt appears stable for d/c and agrees to arrange PMD f/u for further pain management.      Malay Fantroy L. Rowe Robertriplett, PA-C 03/10/14  2223  Glynn OctaveStephen Rancour, MD 03/11/14 [phone]

## 2014-04-01 ENCOUNTER — Emergency Department (HOSPITAL_COMMUNITY)
Admission: EM | Admit: 2014-04-01 | Discharge: 2014-04-01 | Disposition: A | Discharge status: Home / Self Care | Payer: Medicaid Other | Attending: Emergency Medicine | Admitting: Emergency Medicine

## 2014-04-01 ENCOUNTER — Encounter (HOSPITAL_COMMUNITY): Payer: Self-pay | Admitting: Emergency Medicine

## 2014-04-01 DIAGNOSIS — Z720 Tobacco use: Secondary | ICD-10-CM | POA: Diagnosis not present

## 2014-04-01 DIAGNOSIS — M545 Low back pain: Secondary | ICD-10-CM | POA: Insufficient documentation

## 2014-04-01 DIAGNOSIS — Z87442 Personal history of urinary calculi: Secondary | ICD-10-CM | POA: Diagnosis not present

## 2014-04-01 DIAGNOSIS — J449 Chronic obstructive pulmonary disease, unspecified: Secondary | ICD-10-CM | POA: Insufficient documentation

## 2014-04-01 DIAGNOSIS — M79674 Pain in right toe(s): Secondary | ICD-10-CM

## 2014-04-01 DIAGNOSIS — Z791 Long term (current) use of non-steroidal anti-inflammatories (NSAID): Secondary | ICD-10-CM | POA: Diagnosis not present

## 2014-04-01 DIAGNOSIS — Z79899 Other long term (current) drug therapy: Secondary | ICD-10-CM | POA: Diagnosis not present

## 2014-04-01 DIAGNOSIS — R0789 Other chest pain: Secondary | ICD-10-CM | POA: Insufficient documentation

## 2014-04-01 DIAGNOSIS — F419 Anxiety disorder, unspecified: Secondary | ICD-10-CM | POA: Insufficient documentation

## 2014-04-01 DIAGNOSIS — Z8669 Personal history of other diseases of the nervous system and sense organs: Secondary | ICD-10-CM | POA: Diagnosis not present

## 2014-04-01 DIAGNOSIS — F329 Major depressive disorder, single episode, unspecified: Secondary | ICD-10-CM | POA: Diagnosis not present

## 2014-04-01 DIAGNOSIS — Z8619 Personal history of other infectious and parasitic diseases: Secondary | ICD-10-CM | POA: Diagnosis not present

## 2014-04-01 DIAGNOSIS — Z87440 Personal history of urinary (tract) infections: Secondary | ICD-10-CM | POA: Insufficient documentation

## 2014-04-01 DIAGNOSIS — Z8781 Personal history of (healed) traumatic fracture: Secondary | ICD-10-CM | POA: Diagnosis not present

## 2014-04-01 DIAGNOSIS — M797 Fibromyalgia: Secondary | ICD-10-CM | POA: Diagnosis not present

## 2014-04-01 DIAGNOSIS — R0781 Pleurodynia: Secondary | ICD-10-CM

## 2014-04-01 MED ORDER — HYDROCODONE-ACETAMINOPHEN 5-325 MG PO TABS: 1.0000 | ORAL_TABLET | ORAL | Status: DC | PRN

## 2014-04-01 NOTE — ED Notes (Signed)
Pt c/o continued pain to back, ribs and right great toe since fall in Oct.

## 2014-04-01 NOTE — ED Notes (Addendum)
Pt fell and had rib pain a while ago. Was supposed to see Dr. [name] but could not see him over the holidays. Reports right toe joint causing severe pain and having lower back pain. States need something for pain until she can see him.

## 2014-04-01 NOTE — Discharge Instructions (Signed)
Take Vicodin as needed for pain. Follow up with Dr. [name] for further evaluation. Refer to attached documents for more information.

## 2014-04-01 NOTE — ED Provider Notes (Signed)
CSN: [id number]     Arrival date & time 04/01/14  1356 History  This chart was scribed for non-physician practitioner, Emilia BeckKaitlyn Ritaj Dullea, PA-C, working with Raeford RazorStephen Kohut, MD, by Ronney LionSuzanne Le, ED Scribe. This patient was seen in room TR06C/TR06C and the patient's care was started at 3:31 PM.    Chief Complaint  Patient presents with  . Toe Pain  . Back Pain   The history is provided by the patient. No language interpreter was used.     HPI Comments: [name] is a 39 y.o. female who presents to the Emergency Department complaining of right great toe pain and left rib pain. She states she was seen here about 3 months ago after she dropped a heavy object on her right great toe, and was given an XR, which was negative, as well as short-term pain medication. She complains that her toe pain is worsening and she has to limp due to her pain. Patient was also seen in the ED after tripping on the steps 1 month ago; she had a normal left rib XR. She denies any new injuries. Patient has tried various OTC pain relievers with minimal relief. She wants to see Dr. [name], the orthopedic physician, but has been unable to due to the holidays. 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    Past Medical History  Diagnosis Date  . Asthma   . Emphysema   . COPD (chronic obstructive pulmonary disease)   . Fibromyalgia, primary   . Anxiety   . Depressed   . Nephrolithiasis   . UTI (lower urinary tract infection)   . Kidney stone   . Hepatitis C   . Dysrhythmia   . Fibromyalgia   . Carpal tunnel syndrome    Past Surgical History  Procedure Laterality Date  . Abdominal hysterectomy    . Orif ankle fracture  02/03/2012    Procedure: OPEN REDUCTION INTERNAL FIXATION (ORIF) ANKLE FRACTURE;  Surgeon: Nadara MustardMarcus V Duda, MD;  Location: [facility] OR;  Service: Orthopedics;  Laterality: Right;  Open Reduction Internal Fixation Right Ankle  . Tubal ligation     . Wisdom tooth extraction    . Orif clavicular fracture Left 08/29/2012    Dr [name]  . Orif clavicular fracture Left 08/29/2012    Procedure: OPEN REDUCTION INTERNAL FIXATION (ORIF) CLAVICULAR FRACTURE;  Surgeon: Eldred MangesMark C Yates, MD;  Location: [facility] OR;  Service: Orthopedics;  Laterality: Left;  Open Reduction Internal Fixation Left Clavicle Fracture   Family History  Problem Relation Age of Onset  . Alcohol abuse Father   . Cirrhosis Father   . Cancer Father   . Hyperthyroidism Sister   . Bipolar disorder Sister   . Kidney disease Brother   . Heart attack Mother   . Hypertension Mother    History  Substance Use Topics  . Smoking status: Current Every Day Smoker -- 0.40 packs/day for 10 years    Types: Cigarettes  . Smokeless tobacco: Never Used  . Alcohol Use: Yes     Comment: occasionally   OB History    Obstetric Comments   S/p hysterectomy     Review of Systems  Musculoskeletal: Positive for myalgias.       Toe pain, left rib pain  All other systems reviewed and are negative.     Allergies  Eggs or egg-derived products; Milk-related compounds; and Sulfa antibiotics  Home Medications   Prior to  Admission medications   Medication Sig Start Date End Date Taking? Authorizing Provider  albuterol (PROVENTIL HFA;VENTOLIN HFA) 108 (90 BASE) MCG/ACT inhaler Inhale 2 puffs into the lungs every 4 (four) hours as needed for wheezing. 02/20/12   Latrelle Dodrill, MD  gabapentin (NEURONTIN) 100 MG capsule Take 1 capsule (100 mg total) by mouth 3 (three) times daily. Patient not taking: Reported on 03/05/2014 10/22/13   Abram Sander, MD  HYDROcodone-acetaminophen (NORCO/VICODIN) 5-325 MG per tablet Take one-two tabs po q 4-6 hrs prn pain 03/08/14   Tammy L. Triplett, PA-C  ibuprofen (ADVIL,MOTRIN) 800 MG tablet Take 1 tablet (800 mg total) by mouth 3 (three) times daily. Take with food 03/08/14   Tammy L. Triplett, PA-C  traMADol (ULTRAM) 50 MG tablet Take 1 tablet (50 mg total)  by mouth every 6 (six) hours as needed. 03/05/14   Arthor Captain, PA-C  venlafaxine (EFFEXOR) 75 MG tablet Take 225 mg by mouth daily.    Historical Provider, MD   BP 118/77 mmHg  Pulse 95  Temp(Src) 98.1 F (36.7 C) (Oral)  Resp 18  SpO2 99% Physical Exam  Constitutional: She is oriented to person, place, and time. She appears well-developed and well-nourished. No distress.  HENT:  Head: Normocephalic and atraumatic.  Eyes: Conjunctivae and EOM are normal.  Neck: Neck supple. No tracheal deviation present.  Cardiovascular: Normal rate.   Pulmonary/Chest: Effort normal. No respiratory distress. She exhibits tenderness.  Left lateral lower rib tenderness to palpation. No bruising noted.   Abdominal: Soft.  Musculoskeletal: Normal range of motion.  Full ROM of right great toe. Tenderness to palpation of dorsal aspect of right great toe joint. No obvious deformity.   Neurological: She is alert and oriented to person, place, and time.  Skin: Skin is warm and dry.  Psychiatric: She has a normal mood and affect. Her behavior is normal.  Nursing note and vitals reviewed.   ED Course  Procedures (including critical care time)  DIAGNOSTIC STUDIES: Oxygen Saturation is 99% on room air, normal by my interpretation.    MDM   Final diagnoses:  Great toe pain, right  Rib pain on left side   Patient has persistent pain in right great toe and left lateral ribs. Patient will have Vicodin for pain and recommended follow up with Dr. Ophelia Charter.   I personally performed the services described in this documentation, which was scribed in my presence. The recorded information has been reviewed and is accurate.     [name], PA-C 04/01/14 1604  Raeford Razor, MD 04/02/14 [phone]

## 2014-04-01 NOTE — ED Notes (Signed)
Pt ambulated to nursing desk to ask about when the doctor would see her, because she wanted to know if something was wrong, and if so she wanted to know which orthopedist was on call so that she could get an appointment with a preferred doctor. Pt then ambulated to restroom and then back to her room.

## 2014-04-15 ENCOUNTER — Ambulatory Visit (INDEPENDENT_AMBULATORY_CARE_PROVIDER_SITE_OTHER): Payer: Medicaid Other | Admitting: Family Medicine

## 2014-04-15 ENCOUNTER — Encounter: Payer: Self-pay | Admitting: Family Medicine

## 2014-04-15 VITALS — BP 145/90 | HR 83 | Temp 98.0°F | Ht 66.0 in | Wt 151.4 lb

## 2014-04-15 DIAGNOSIS — J069 Acute upper respiratory infection, unspecified: Secondary | ICD-10-CM

## 2014-04-15 DIAGNOSIS — J4521 Mild intermittent asthma with (acute) exacerbation: Secondary | ICD-10-CM

## 2014-04-15 MED ORDER — ALBUTEROL SULFATE HFA 108 (90 BASE) MCG/ACT IN AERS: 2.0000 | INHALATION_SPRAY | RESPIRATORY_TRACT | Status: DC | PRN

## 2014-04-15 MED ORDER — BENZONATATE 200 MG PO CAPS: 200.0000 mg | ORAL_CAPSULE | Freq: Two times a day (BID) | ORAL | Status: DC | PRN

## 2014-04-15 NOTE — Assessment & Plan Note (Signed)
No evidence of strep throat.  HPI and exam consistent with URI. -RF albuterol x2.  Use scheduled for next 48 hours.  Then use PRN -Tessalon Perles 200mg  BID for cough -Encourage clear fluids -F/U if no improvement

## 2014-04-15 NOTE — Progress Notes (Signed)
Patient ID: [name] L Bark, female   DOB: 20-Aug-1975, 39 y.o.   MRN: [id number]    Subjective: CC: URI HPI: Patient is a 39 y.o. female presenting to clinic today for dry cough, sore throat. Concerns today include:  Cough/Sore throat Patient reports that she has had a cough with sore throat x4 days.  She has not been using any OTCs.  States that she ran out of her albuterol during her move (has SOB with exercise).  She denies fevers, chills, weight loss, diarrhea, headache, SOB, wheeze, change in voice or difficulty swallowing.  Endorses some nausea, congestion, runny nose.  Reports that she has this almost every year and that her albuterol helps.   Social History Reviewed: current smoker. FamHx and MedHx updated.  Please see EMR. Health Maintenance: Declines flu shot today in setting of illness.  ROS: All other systems reviewed and are negative.  Objective: Office vital signs reviewed. BP 145/90 mmHg  Pulse 83  Temp(Src) 98 F (36.7 C) (Oral)  Ht 5\' 6"  (1.676 m)  Wt 151 lb 6.4 oz (68.675 kg)  BMI 24.45 kg/m2  Physical Examination:  General: Awake, alert, well nourished, NAD HEENT: Normal    Neck: No masses palpated. No LAD    Ears: TMs intact, normal light reflex, no erythema, no bulging    Eyes: PERRLA, EOMI    Nose: nasal turbinates erythematous and edematous with +rhinorrhea    Throat: MMM, +erythema but no tonsillar exudates Pulm: CTAB, no wheezes, rhonchi or rales, no increased WOB  Assessment: 39 y.o. female with URI  Plan: See Problem List and After Visit Summary   Raliegh IpAshly M Ayleen Mckinstry, DO PGY-1, [facility]

## 2014-04-15 NOTE — Patient Instructions (Addendum)
It was a pleasure seeing you today, [name]!  Information regarding what we discussed is included in this packet.  Please make an appointment to see Dr Pollie MeyerMcIntyre in 4 weeks or sooner.  Your albuterol is sent to your pharmacy.  Use this every 4 hours for the next 2 days.  Then use as needed for cough/wheeze/shortness of breath.  I'm also sending in Tessalon Perles 200mg  for you to take to help with cough.  Please feel free to call our office at [phone] if any questions or concerns arise.  Warm Regards, Ashly M. Gottschalk, DO    Upper Respiratory Infection, Adult An upper respiratory infection (URI) is also known as the common cold. It is often caused by a type of germ (virus). Colds are easily spread (contagious). You can pass it to others by kissing, coughing, sneezing, or drinking out of the same glass. Usually, you get better in 1 or 2 weeks.  HOME CARE   Only take medicine as told by your doctor.  Use a warm mist humidifier or breathe in steam from a hot shower.  Drink enough water and fluids to keep your pee (urine) clear or pale yellow.  Get plenty of rest.  Return to work when your temperature is back to normal or as told by your doctor. You may use a face mask and wash your hands to stop your cold from spreading. GET HELP RIGHT AWAY IF:   After the first few days, you feel you are getting worse.  You have questions about your medicine.  You have chills, shortness of breath, or brown or red spit (mucus).  You have yellow or brown snot (nasal discharge) or pain in the face, especially when you bend forward.  You have a fever, puffy (swollen) neck, pain when you swallow, or white spots in the back of your throat.  You have a bad headache, ear pain, sinus pain, or chest pain.  You have a high-pitched whistling sound when you breathe in and out (wheezing).  You have a lasting cough or cough up blood.  You have sore muscles or a stiff neck. MAKE SURE YOU:    Understand these instructions.  Will watch your condition.  Will get help right away if you are not doing well or get worse. Document Released: 08/31/2007 Document Revised: 06/06/2011 Document Reviewed: 06/19/2013 [facility] Patient Information 2015 [employer] [place]. This information is not intended to replace advice given to you by your health care provider. Make sure you discuss any questions you have with your health care provider.

## 2014-04-25 ENCOUNTER — Emergency Department (HOSPITAL_BASED_OUTPATIENT_CLINIC_OR_DEPARTMENT_OTHER): Payer: Medicaid Other

## 2014-04-25 ENCOUNTER — Emergency Department (HOSPITAL_BASED_OUTPATIENT_CLINIC_OR_DEPARTMENT_OTHER)
Admission: EM | Admit: 2014-04-25 | Discharge: 2014-04-25 | Disposition: A | Discharge status: Home / Self Care | Payer: Medicaid Other | Attending: Emergency Medicine | Admitting: Emergency Medicine

## 2014-04-25 ENCOUNTER — Encounter (HOSPITAL_BASED_OUTPATIENT_CLINIC_OR_DEPARTMENT_OTHER): Payer: Self-pay | Admitting: *Deleted

## 2014-04-25 DIAGNOSIS — Y9389 Activity, other specified: Secondary | ICD-10-CM | POA: Insufficient documentation

## 2014-04-25 DIAGNOSIS — Z720 Tobacco use: Secondary | ICD-10-CM | POA: Insufficient documentation

## 2014-04-25 DIAGNOSIS — Z87440 Personal history of urinary (tract) infections: Secondary | ICD-10-CM | POA: Insufficient documentation

## 2014-04-25 DIAGNOSIS — R0789 Other chest pain: Secondary | ICD-10-CM

## 2014-04-25 DIAGNOSIS — Z8619 Personal history of other infectious and parasitic diseases: Secondary | ICD-10-CM | POA: Insufficient documentation

## 2014-04-25 DIAGNOSIS — Z79899 Other long term (current) drug therapy: Secondary | ICD-10-CM | POA: Insufficient documentation

## 2014-04-25 DIAGNOSIS — S3992XA Unspecified injury of lower back, initial encounter: Secondary | ICD-10-CM | POA: Diagnosis present

## 2014-04-25 DIAGNOSIS — Z87442 Personal history of urinary calculi: Secondary | ICD-10-CM | POA: Diagnosis not present

## 2014-04-25 DIAGNOSIS — Y9289 Other specified places as the place of occurrence of the external cause: Secondary | ICD-10-CM | POA: Insufficient documentation

## 2014-04-25 DIAGNOSIS — J449 Chronic obstructive pulmonary disease, unspecified: Secondary | ICD-10-CM | POA: Diagnosis not present

## 2014-04-25 DIAGNOSIS — W1839XA Other fall on same level, initial encounter: Secondary | ICD-10-CM | POA: Insufficient documentation

## 2014-04-25 DIAGNOSIS — T148XXA Other injury of unspecified body region, initial encounter: Secondary | ICD-10-CM

## 2014-04-25 DIAGNOSIS — F419 Anxiety disorder, unspecified: Secondary | ICD-10-CM | POA: Insufficient documentation

## 2014-04-25 DIAGNOSIS — F329 Major depressive disorder, single episode, unspecified: Secondary | ICD-10-CM | POA: Insufficient documentation

## 2014-04-25 DIAGNOSIS — S2020XA Contusion of thorax, unspecified, initial encounter: Secondary | ICD-10-CM | POA: Diagnosis not present

## 2014-04-25 DIAGNOSIS — Y998 Other external cause status: Secondary | ICD-10-CM | POA: Diagnosis not present

## 2014-04-25 LAB — URINALYSIS, ROUTINE W REFLEX MICROSCOPIC
Bilirubin Urine: NEGATIVE
Glucose, UA: NEGATIVE mg/dL
HGB URINE DIPSTICK: NEGATIVE
Ketones, ur: 15 mg/dL — AB
LEUKOCYTES UA: NEGATIVE
NITRITE: NEGATIVE
PROTEIN: NEGATIVE mg/dL
SPECIFIC GRAVITY, URINE: 1.028 (ref 1.005–1.030)
UROBILINOGEN UA: 1 mg/dL (ref 0.0–1.0)
pH: 5.5 (ref 5.0–8.0)

## 2014-04-25 LAB — URINE MICROSCOPIC-ADD ON

## 2014-04-25 MED ORDER — OXYCODONE-ACETAMINOPHEN 5-325 MG PO TABS: 1.0000 | ORAL_TABLET | ORAL | Status: DC | PRN

## 2014-04-25 NOTE — ED Notes (Signed)
Slipped on ice last night and fell. Injury to her mid back. Bruising noted.

## 2014-04-25 NOTE — Discharge Instructions (Signed)

## 2014-04-25 NOTE — ED Provider Notes (Signed)
CSN: [id number]     Arrival date & time 04/25/14  1126 History   First MD Initiated Contact with Patient 04/25/14 1204     Chief Complaint  Patient presents with  . Fall  . Back Pain     (Consider location/radiation/quality/duration/timing/severity/associated sxs/prior Treatment) Patient is a 39 y.o. female presenting with fall and back pain.  Fall This is a new problem. The current episode started yesterday. The problem occurs constantly. The problem has not changed since onset.Associated symptoms include chest pain (R posterolateral). The symptoms are aggravated by twisting (palpation). Nothing relieves the symptoms.  Back Pain Associated symptoms: chest pain (R posterolateral)     Past Medical History  Diagnosis Date  . Asthma   . Emphysema   . COPD (chronic obstructive pulmonary disease)   . Fibromyalgia, primary   . Anxiety   . Depressed   . Nephrolithiasis   . UTI (lower urinary tract infection)   . Kidney stone   . Hepatitis C   . Dysrhythmia   . Fibromyalgia   . Carpal tunnel syndrome    Past Surgical History  Procedure Laterality Date  . Abdominal hysterectomy    . Orif ankle fracture  02/03/2012    Procedure: OPEN REDUCTION INTERNAL FIXATION (ORIF) ANKLE FRACTURE;  Surgeon: Nadara Mustard, MD;  Location: [facility] OR;  Service: Orthopedics;  Laterality: Right;  Open Reduction Internal Fixation Right Ankle  . Tubal ligation    . Wisdom tooth extraction    . Orif clavicular fracture Left 08/29/2012    Dr Ophelia Charter  . Orif clavicular fracture Left 08/29/2012    Procedure: OPEN REDUCTION INTERNAL FIXATION (ORIF) CLAVICULAR FRACTURE;  Surgeon: Eldred Manges, MD;  Location: [facility] OR;  Service: Orthopedics;  Laterality: Left;  Open Reduction Internal Fixation Left Clavicle Fracture   Family History  Problem Relation Age of Onset  . Alcohol abuse Father   . Cirrhosis Father   . Cancer Father   . Hyperthyroidism Sister   . Bipolar disorder Sister   . Kidney disease Brother   .  Heart attack Mother   . Hypertension Mother    History  Substance Use Topics  . Smoking status: Current Every Day Smoker -- 0.40 packs/day for 10 years    Types: Cigarettes  . Smokeless tobacco: Never Used  . Alcohol Use: Yes     Comment: occasionally   OB History    Obstetric Comments   S/p hysterectomy     Review of Systems  Cardiovascular: Positive for chest pain (R posterolateral).  Musculoskeletal: Positive for back pain.  All other systems reviewed and are negative.     Allergies  Eggs or egg-derived products; Milk-related compounds; and Sulfa antibiotics  Home Medications   Prior to Admission medications   Medication Sig Start Date End Date Taking? Authorizing Provider  albuterol (PROVENTIL HFA;VENTOLIN HFA) 108 (90 BASE) MCG/ACT inhaler Inhale 2 puffs into the lungs every 4 (four) hours as needed for wheezing. 04/15/14   Ashly Hulen Skains, DO  benzonatate (TESSALON) 200 MG capsule Take 1 capsule (200 mg total) by mouth 2 (two) times daily as needed for cough. 04/15/14   Ashly Hulen Skains, DO  gabapentin (NEURONTIN) 100 MG capsule Take 1 capsule (100 mg total) by mouth 3 (three) times daily. Patient not taking: Reported on 03/05/2014 10/22/13   Abram Sander, MD  HYDROcodone-acetaminophen (NORCO/VICODIN) 5-325 MG per tablet Take 1-2 tablets by mouth every 4 (four) hours as needed for moderate pain or severe pain. 04/01/14  Kaitlyn Szekalski, PA-C  ibuprofen (ADVIL,MOTRIN) 800 MG tablet Take 1 tablet (800 mg total) by mouth 3 (three) times daily. Take with food 03/08/14   Tammy L. Triplett, PA-C  oxyCODONE-acetaminophen (PERCOCET/ROXICET) 5-325 MG per tablet Take 1-2 tablets by mouth every 4 (four) hours as needed for severe pain. 04/25/14   Mirian MoMatthew Gentry, MD  traMADol (ULTRAM) 50 MG tablet Take 1 tablet (50 mg total) by mouth every 6 (six) hours as needed. 03/05/14   Arthor CaptainAbigail Harris, PA-C  venlafaxine (EFFEXOR) 75 MG tablet Take 225 mg by mouth daily.    Historical Provider,  MD   BP 142/100 mmHg  Pulse 96  Temp(Src) 98.3 F (36.8 C) (Oral)  Resp 18  Ht 5\' 6"  (1.676 m)  Wt 151 lb (68.493 kg)  BMI 24.38 kg/m2  SpO2 100% Physical Exam  Constitutional: She is oriented to person, place, and time. She appears well-developed and well-nourished.  HENT:  Head: Normocephalic and atraumatic.  Right Ear: External ear normal.  Left Ear: External ear normal.  Eyes: Conjunctivae and EOM are normal. Pupils are equal, round, and reactive to light.  Neck: Normal range of motion. Neck supple.  Cardiovascular: Normal rate, regular rhythm, normal heart sounds and intact distal pulses.   Pulmonary/Chest: Effort normal and breath sounds normal. She exhibits tenderness and bony tenderness.  Abdominal: Soft. Bowel sounds are normal. There is no tenderness.  Musculoskeletal: Normal range of motion.       Cervical back: Normal.       Thoracic back: Normal.  Neurological: She is alert and oriented to person, place, and time.  Skin: Skin is warm and dry.  Vitals reviewed.   ED Course  Procedures (including critical care time) Labs Review Labs Reviewed  URINALYSIS, ROUTINE W REFLEX MICROSCOPIC - Abnormal; Notable for the following:    Color, Urine AMBER (*)    APPearance TURBID (*)    Ketones, ur 15 (*)    All other components within normal limits  URINE MICROSCOPIC-ADD ON - Abnormal; Notable for the following:    Squamous Epithelial / LPF FEW (*)    Crystals CA OXALATE CRYSTALS (*)    All other components within normal limits    Imaging Review No results found.   EKG Interpretation None      MDM   Final diagnoses:  Chest wall pain  Contusion    39 y.o. female with pertinent PMH of COPD, Hep C presents with R posterolateral chest wall pain after mechanical fall last night. Patient denies dyspnea, hemoptysis, other concerning features. Physical exam on arrival as above. Chest x-ray demonstrated no acute rib fx.  No hematuria.  Possible occult fx, which was  discussed with pt.  Standard return precautions given.    I have reviewed all laboratory and imaging studies if ordered as above  1. Chest wall pain   2. Contusion         Mirian MoMatthew Gentry, MD 04/29/14 [phone]

## 2014-04-25 NOTE — ED Notes (Signed)
Reports pain over right rib area with palpation and tenderness over right mid back.  No obvious bruising.

## 2014-05-12 ENCOUNTER — Ambulatory Visit: Payer: Medicaid Other | Admitting: Family Medicine

## 2014-05-27 ENCOUNTER — Emergency Department (HOSPITAL_COMMUNITY)
Admission: EM | Admit: 2014-05-27 | Discharge: 2014-05-27 | Disposition: A | Discharge status: Home / Self Care | Payer: Medicaid Other | Attending: Emergency Medicine | Admitting: Emergency Medicine

## 2014-05-27 ENCOUNTER — Emergency Department (HOSPITAL_COMMUNITY): Payer: Medicaid Other

## 2014-05-27 ENCOUNTER — Encounter (HOSPITAL_COMMUNITY): Payer: Self-pay | Admitting: Emergency Medicine

## 2014-05-27 DIAGNOSIS — Z87442 Personal history of urinary calculi: Secondary | ICD-10-CM | POA: Diagnosis not present

## 2014-05-27 DIAGNOSIS — M79674 Pain in right toe(s): Secondary | ICD-10-CM | POA: Insufficient documentation

## 2014-05-27 DIAGNOSIS — Z8619 Personal history of other infectious and parasitic diseases: Secondary | ICD-10-CM | POA: Diagnosis not present

## 2014-05-27 DIAGNOSIS — F329 Major depressive disorder, single episode, unspecified: Secondary | ICD-10-CM | POA: Insufficient documentation

## 2014-05-27 DIAGNOSIS — F419 Anxiety disorder, unspecified: Secondary | ICD-10-CM | POA: Diagnosis not present

## 2014-05-27 DIAGNOSIS — Z720 Tobacco use: Secondary | ICD-10-CM | POA: Insufficient documentation

## 2014-05-27 DIAGNOSIS — J449 Chronic obstructive pulmonary disease, unspecified: Secondary | ICD-10-CM | POA: Insufficient documentation

## 2014-05-27 DIAGNOSIS — Z8679 Personal history of other diseases of the circulatory system: Secondary | ICD-10-CM | POA: Insufficient documentation

## 2014-05-27 DIAGNOSIS — Z8669 Personal history of other diseases of the nervous system and sense organs: Secondary | ICD-10-CM | POA: Diagnosis not present

## 2014-05-27 DIAGNOSIS — R000 Tachycardia, unspecified: Secondary | ICD-10-CM | POA: Diagnosis not present

## 2014-05-27 MED ORDER — TRAMADOL HCL 50 MG PO TABS: 50.0000 mg | ORAL_TABLET | Freq: Four times a day (QID) | ORAL | Status: DC | PRN
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
## 2014-05-27 NOTE — Discharge Instructions (Signed)
Your x-ray today shows no fracture or dislocation. You are having joint pain most likely due to inflammation. Follow up with Dr. Romeo AppleHarrison. Do not take the narcotic if driving as it will make you sleepy.

## 2014-05-27 NOTE — ED Notes (Signed)
Patient c/o right great toe pain x6-7 months that is progressively getting worse. Per patient had "toe stumped on months ago and just dealt with pain." Patient reports using multiple over-the-counter medication with no relief. Last used ibuprofen today at 12 with no relief. Per patient when walking gets nauseated from pain. Denies any fevers.

## 2014-06-27 ENCOUNTER — Emergency Department (HOSPITAL_COMMUNITY): Payer: Medicaid Other

## 2014-06-27 ENCOUNTER — Emergency Department (HOSPITAL_COMMUNITY)
Admission: EM | Admit: 2014-06-27 | Discharge: 2014-06-27 | Disposition: A | Discharge status: Home / Self Care | Payer: Medicaid Other | Attending: Emergency Medicine | Admitting: Emergency Medicine

## 2014-06-27 ENCOUNTER — Encounter (HOSPITAL_COMMUNITY): Payer: Self-pay | Admitting: *Deleted

## 2014-06-27 DIAGNOSIS — Z87442 Personal history of urinary calculi: Secondary | ICD-10-CM | POA: Diagnosis not present

## 2014-06-27 DIAGNOSIS — Y92410 Unspecified street and highway as the place of occurrence of the external cause: Secondary | ICD-10-CM | POA: Diagnosis not present

## 2014-06-27 DIAGNOSIS — Z79899 Other long term (current) drug therapy: Secondary | ICD-10-CM | POA: Diagnosis not present

## 2014-06-27 DIAGNOSIS — R4182 Altered mental status, unspecified: Secondary | ICD-10-CM | POA: Insufficient documentation

## 2014-06-27 DIAGNOSIS — Y998 Other external cause status: Secondary | ICD-10-CM | POA: Diagnosis not present

## 2014-06-27 DIAGNOSIS — Z8669 Personal history of other diseases of the nervous system and sense organs: Secondary | ICD-10-CM | POA: Diagnosis not present

## 2014-06-27 DIAGNOSIS — S0083XA Contusion of other part of head, initial encounter: Secondary | ICD-10-CM | POA: Insufficient documentation

## 2014-06-27 DIAGNOSIS — Z87440 Personal history of urinary (tract) infections: Secondary | ICD-10-CM | POA: Insufficient documentation

## 2014-06-27 DIAGNOSIS — M797 Fibromyalgia: Secondary | ICD-10-CM | POA: Insufficient documentation

## 2014-06-27 DIAGNOSIS — S199XXA Unspecified injury of neck, initial encounter: Secondary | ICD-10-CM | POA: Insufficient documentation

## 2014-06-27 DIAGNOSIS — S0093XA Contusion of unspecified part of head, initial encounter: Secondary | ICD-10-CM

## 2014-06-27 DIAGNOSIS — S9031XA Contusion of right foot, initial encounter: Secondary | ICD-10-CM | POA: Diagnosis not present

## 2014-06-27 DIAGNOSIS — Y9389 Activity, other specified: Secondary | ICD-10-CM | POA: Diagnosis not present

## 2014-06-27 DIAGNOSIS — R0602 Shortness of breath: Secondary | ICD-10-CM

## 2014-06-27 DIAGNOSIS — S3991XA Unspecified injury of abdomen, initial encounter: Secondary | ICD-10-CM | POA: Diagnosis not present

## 2014-06-27 DIAGNOSIS — Z8659 Personal history of other mental and behavioral disorders: Secondary | ICD-10-CM | POA: Diagnosis not present

## 2014-06-27 DIAGNOSIS — S0990XA Unspecified injury of head, initial encounter: Secondary | ICD-10-CM | POA: Diagnosis present

## 2014-06-27 DIAGNOSIS — Z8619 Personal history of other infectious and parasitic diseases: Secondary | ICD-10-CM | POA: Diagnosis not present

## 2014-06-27 DIAGNOSIS — Z720 Tobacco use: Secondary | ICD-10-CM | POA: Insufficient documentation

## 2014-06-27 DIAGNOSIS — J441 Chronic obstructive pulmonary disease with (acute) exacerbation: Secondary | ICD-10-CM | POA: Insufficient documentation

## 2014-06-27 LAB — COMPREHENSIVE METABOLIC PANEL
ALBUMIN: 3.6 g/dL (ref 3.5–5.2)
ALK PHOS: 85 U/L (ref 39–117)
ALT: 58 U/L — ABNORMAL HIGH (ref 0–35)
ANION GAP: 8 (ref 5–15)
AST: 40 U/L — AB (ref 0–37)
BILIRUBIN TOTAL: 0.8 mg/dL (ref 0.3–1.2)
BUN: 14 mg/dL (ref 6–23)
CHLORIDE: 106 mmol/L (ref 96–112)
CO2: 26 mmol/L (ref 19–32)
Calcium: 9.2 mg/dL (ref 8.4–10.5)
Creatinine, Ser: 0.73 mg/dL (ref 0.50–1.10)
GFR calc Af Amer: >90 mL/min (ref >90)
Glucose, Bld: 82 mg/dL (ref 70–99)
POTASSIUM: 4.3 mmol/L (ref 3.5–5.1)
SODIUM: 140 mmol/L (ref 135–145)
Total Protein: 6.1 g/dL (ref 6.0–8.3)

## 2014-06-27 LAB — RAPID URINE DRUG SCREEN, HOSP PERFORMED
AMPHETAMINES: POSITIVE — AB
BENZODIAZEPINES: POSITIVE — AB
Barbiturates: NOT DETECTED
Cocaine: NOT DETECTED
Opiates: POSITIVE — AB
Tetrahydrocannabinol: NOT DETECTED

## 2014-06-27 LAB — CBC
HCT: 35.8 % — ABNORMAL LOW (ref 36.0–46.0)
Hemoglobin: 12 g/dL (ref 12.0–15.0)
MCH: 31.5 pg (ref 26.0–34.0)
MCHC: 33.5 g/dL (ref 30.0–36.0)
MCV: 94 fL (ref 78.0–100.0)
PLATELETS: 230 10*3/uL (ref 150–400)
RBC: 3.81 MIL/uL — ABNORMAL LOW (ref 3.87–5.11)
RDW: 12.8 % (ref 11.5–15.5)
WBC: 4 10*3/uL (ref 4.0–10.5)

## 2014-06-27 LAB — ETHANOL: Alcohol, Ethyl (B): <5 mg/dL (ref 0–9)

## 2014-06-27 MED ORDER — IOHEXOL 300 MG/ML  SOLN
80.0000 mL | Freq: Once | INTRAMUSCULAR | Status: AC | PRN
  Administered 2014-06-27: 80 mL via INTRAVENOUS

## 2014-06-27 MED ORDER — TRAMADOL HCL 50 MG PO TABS: 50.0000 mg | ORAL_TABLET | Freq: Four times a day (QID) | ORAL | Status: DC | PRN
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
## 2014-06-27 NOTE — ED Notes (Addendum)
MD at bedside. 

## 2014-06-27 NOTE — ED Notes (Addendum)
PT ambulated 75+ feet in hallway without assistance. PT has staggered gait. Pt denies dizziness or lightheadedness. Pt complains of stomach and leg pain during ambulation. PT seems very sleepy during ambulation. Pt returned to bed. Monitored by pulse ox, bp cuff, and 5-lead.

## 2014-08-05 ENCOUNTER — Emergency Department (INDEPENDENT_AMBULATORY_CARE_PROVIDER_SITE_OTHER)
Admission: EM | Admit: 2014-08-05 | Discharge: 2014-08-05 | Disposition: A | Discharge status: Nursing Home (Includes ICF) | Payer: Medicaid Other | Source: Home / Self Care | Attending: Family Medicine | Admitting: Family Medicine

## 2014-08-05 ENCOUNTER — Emergency Department (HOSPITAL_COMMUNITY): Payer: Medicaid Other

## 2014-08-05 ENCOUNTER — Emergency Department (HOSPITAL_COMMUNITY)
Admission: EM | Admit: 2014-08-05 | Discharge: 2014-08-05 | Disposition: A | Discharge status: Home / Self Care | Payer: Medicaid Other | Attending: Emergency Medicine | Admitting: Emergency Medicine

## 2014-08-05 ENCOUNTER — Encounter (HOSPITAL_COMMUNITY): Payer: Self-pay | Admitting: *Deleted

## 2014-08-05 ENCOUNTER — Encounter (HOSPITAL_COMMUNITY): Payer: Self-pay | Admitting: Physical Medicine and Rehabilitation

## 2014-08-05 DIAGNOSIS — Y929 Unspecified place or not applicable: Secondary | ICD-10-CM | POA: Diagnosis not present

## 2014-08-05 DIAGNOSIS — Z87440 Personal history of urinary (tract) infections: Secondary | ICD-10-CM | POA: Diagnosis not present

## 2014-08-05 DIAGNOSIS — S199XXA Unspecified injury of neck, initial encounter: Secondary | ICD-10-CM | POA: Insufficient documentation

## 2014-08-05 DIAGNOSIS — Z8679 Personal history of other diseases of the circulatory system: Secondary | ICD-10-CM | POA: Insufficient documentation

## 2014-08-05 DIAGNOSIS — S0083XA Contusion of other part of head, initial encounter: Secondary | ICD-10-CM

## 2014-08-05 DIAGNOSIS — F329 Major depressive disorder, single episode, unspecified: Secondary | ICD-10-CM | POA: Insufficient documentation

## 2014-08-05 DIAGNOSIS — S0993XA Unspecified injury of face, initial encounter: Secondary | ICD-10-CM | POA: Diagnosis present

## 2014-08-05 DIAGNOSIS — S0012XA Contusion of left eyelid and periocular area, initial encounter: Secondary | ICD-10-CM | POA: Insufficient documentation

## 2014-08-05 DIAGNOSIS — Z87442 Personal history of urinary calculi: Secondary | ICD-10-CM | POA: Insufficient documentation

## 2014-08-05 DIAGNOSIS — S0093XA Contusion of unspecified part of head, initial encounter: Secondary | ICD-10-CM | POA: Diagnosis not present

## 2014-08-05 DIAGNOSIS — M542 Cervicalgia: Secondary | ICD-10-CM | POA: Diagnosis not present

## 2014-08-05 DIAGNOSIS — Z8619 Personal history of other infectious and parasitic diseases: Secondary | ICD-10-CM | POA: Diagnosis not present

## 2014-08-05 DIAGNOSIS — Y999 Unspecified external cause status: Secondary | ICD-10-CM | POA: Diagnosis not present

## 2014-08-05 DIAGNOSIS — R0789 Other chest pain: Secondary | ICD-10-CM

## 2014-08-05 DIAGNOSIS — Z8739 Personal history of other diseases of the musculoskeletal system and connective tissue: Secondary | ICD-10-CM | POA: Insufficient documentation

## 2014-08-05 DIAGNOSIS — Z720 Tobacco use: Secondary | ICD-10-CM | POA: Insufficient documentation

## 2014-08-05 DIAGNOSIS — Z79899 Other long term (current) drug therapy: Secondary | ICD-10-CM | POA: Insufficient documentation

## 2014-08-05 DIAGNOSIS — S20212A Contusion of left front wall of thorax, initial encounter: Secondary | ICD-10-CM | POA: Insufficient documentation

## 2014-08-05 DIAGNOSIS — F419 Anxiety disorder, unspecified: Secondary | ICD-10-CM | POA: Insufficient documentation

## 2014-08-05 DIAGNOSIS — S40021A Contusion of right upper arm, initial encounter: Secondary | ICD-10-CM | POA: Diagnosis not present

## 2014-08-05 DIAGNOSIS — R51 Headache: Secondary | ICD-10-CM | POA: Diagnosis not present

## 2014-08-05 DIAGNOSIS — J449 Chronic obstructive pulmonary disease, unspecified: Secondary | ICD-10-CM | POA: Insufficient documentation

## 2014-08-05 DIAGNOSIS — Y939 Activity, unspecified: Secondary | ICD-10-CM | POA: Diagnosis not present

## 2014-08-05 DIAGNOSIS — Z8669 Personal history of other diseases of the nervous system and sense organs: Secondary | ICD-10-CM | POA: Insufficient documentation

## 2014-08-05 MED ORDER — HYDROCODONE-ACETAMINOPHEN 5-325 MG PO TABS: 1.0000 | ORAL_TABLET | ORAL | Status: DC | PRN

## 2014-08-05 MED ORDER — HYDROCODONE-ACETAMINOPHEN 5-325 MG PO TABS
2.0000 | ORAL_TABLET | Freq: Once | ORAL | Status: AC
  Administered 2014-08-05: 2 via ORAL
  Filled 2014-08-05: qty 2

## 2014-08-05 NOTE — ED Notes (Signed)
MD at bedside. 
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
## 2014-08-05 NOTE — Discharge Instructions (Signed)
Assault, General °Assault includes any behavior, whether intentional or reckless, which results in bodily injury to another person and/or damage to property. Included in this would be any behavior, intentional or reckless, that by its nature would be understood (interpreted) by a reasonable person as intent to harm another person or to damage his/her property. Threats may be oral or written. They may be communicated through regular mail, computer, fax, or phone. These threats may be direct or implied. °FORMS OF ASSAULT INCLUDE: °· Physically assaulting a person. This includes physical threats to inflict physical harm as well as: °· Slapping. °· Hitting. °· Poking. °· Kicking. °· Punching. °· Pushing. °· Arson. °· Sabotage. °· Equipment vandalism. °· Damaging or destroying property. °· Throwing or hitting objects. °· Displaying a weapon or an object that appears to be a weapon in a threatening manner. °· Carrying a firearm of any kind. °· Using a weapon to harm someone. °· Using greater physical size/strength to intimidate another. °· Making intimidating or threatening gestures. °· Bullying. °· Hazing. °· Intimidating, threatening, hostile, or abusive language directed toward another person. °· It communicates the intention to engage in violence against that person. And it leads a reasonable person to expect that violent behavior may occur. °· Stalking another person. °IF IT HAPPENS AGAIN: °· Immediately call for emergency help (911 in [place] °· If someone poses clear and immediate danger to you, seek legal authorities to have a protective or restraining order put in place. °· Less threatening assaults can at least be reported to authorities. °STEPS TO TAKE IF A SEXUAL ASSAULT HAS HAPPENED °· Go to an area of safety. This may include a shelter or staying with a friend. Stay away from the area where you have been attacked. A large percentage of sexual assaults are caused by a friend, relative or associate. °· If  medications were given by your caregiver, take them as directed for the full length of time prescribed. °· Only take over-the-counter or prescription medicines for pain, discomfort, or fever as directed by your caregiver. °· If you have come in contact with a sexual disease, find out if you are to be tested again. If your caregiver is concerned about the HIV/AIDS virus, he/she may require you to have continued testing for several months. °· For the protection of your privacy, test results can not be given over the phone. Make sure you receive the results of your test. If your test results are not back during your visit, make an appointment with your caregiver to find out the results. Do not assume everything is normal if you have not heard from your caregiver or the medical facility. It is important for you to follow up on all of your test results. °· File appropriate papers with authorities. This is important in all assaults, even if it has occurred in a family or by a friend. °SEEK MEDICAL CARE IF: °· You have new problems because of your injuries. °· You have problems that may be because of the medicine you are taking, such as: °· Rash. °· Itching. °· Swelling. °· Trouble breathing. °· You develop belly (abdominal) pain, feel sick to your stomach (nausea) or are vomiting. °· You begin to run a temperature. °· You need supportive care or referral to a rape crisis center. These are centers with trained personnel who can help you get through this ordeal. °SEEK IMMEDIATE MEDICAL CARE IF: °· You are afraid of being threatened, beaten, or abused. In [place] call 911. °· You   receive new injuries related to abuse. °· You develop severe pain in any area injured in the assault or have any change in your condition that concerns you. °· You faint or lose consciousness. °· You develop chest pain or shortness of breath. °Document Released: 03/14/2005 Document Revised: 06/06/2011 Document Reviewed: 10/31/2007 °[employer]® Patient  Information ©2015 [employer]. This information is not intended to replace advice given to you by your health care provider. Make sure you discuss any questions you have with your health care provider. ° °Contusion °A contusion is a deep bruise. Contusions are the result of an injury that caused bleeding under the skin. The contusion may turn blue, purple, or yellow. Minor injuries will give you a painless contusion, but more severe contusions may stay painful and swollen for a few weeks.  °CAUSES  °A contusion is usually caused by a blow, trauma, or direct force to an area of the body. °SYMPTOMS  °· Swelling and redness of the injured area. °· Bruising of the injured area. °· Tenderness and soreness of the injured area. °· Pain. °DIAGNOSIS  °The diagnosis can be made by taking a history and physical exam. An X-ray, CT scan, or MRI may be needed to determine if there were any associated injuries, such as fractures. °TREATMENT  °Specific treatment will depend on what area of the body was injured. In general, the best treatment for a contusion is resting, icing, elevating, and applying cold compresses to the injured area. Over-the-counter medicines may also be recommended for pain control. Ask your caregiver what the best treatment is for your contusion. °HOME CARE INSTRUCTIONS  °· Put ice on the injured area. °¨ Put ice in a plastic bag. °¨ Place a towel between your skin and the bag. °¨ Leave the ice on for 15-20 minutes, 3-4 times a day, or as directed by your health care provider. °· Only take over-the-counter or prescription medicines for pain, discomfort, or fever as directed by your caregiver. Your caregiver may recommend avoiding anti-inflammatory medicines (aspirin, ibuprofen, and naproxen) for 48 hours because these medicines may increase bruising. °· Rest the injured area. °· If possible, elevate the injured area to reduce swelling. °SEEK IMMEDIATE MEDICAL CARE IF:  °· You have increased bruising or  swelling. °· You have pain that is getting worse. °· Your swelling or pain is not relieved with medicines. °MAKE SURE YOU:  °· Understand these instructions. °· Will watch your condition. °· Will get help right away if you are not doing well or get worse. °Document Released: 12/22/2004 Document Revised: 03/19/2013 Document Reviewed: 01/17/2011 °[employer]® Patient Information ©2015 [employer]. This information is not intended to replace advice given to you by your health care provider. Make sure you discuss any questions you have with your health care provider. ° °
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
## 2014-08-05 NOTE — ED Notes (Signed)
Pt c/o RLQ pain, tenderness on palpation
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
## 2014-10-18 IMAGING — US US ABDOMEN COMPLETE
1 series · 14 of 25 positions shown · non-contrast
Comparison: 04/26/2011

CLINICAL DATA: Elevated LFTs, hepatomegaly

ABDOMINAL ULTRASOUND COMPLETE

[Series 1: us abdomen complete · 0.24mm/px · 14 of 69 slices shown]
[im 1/69]
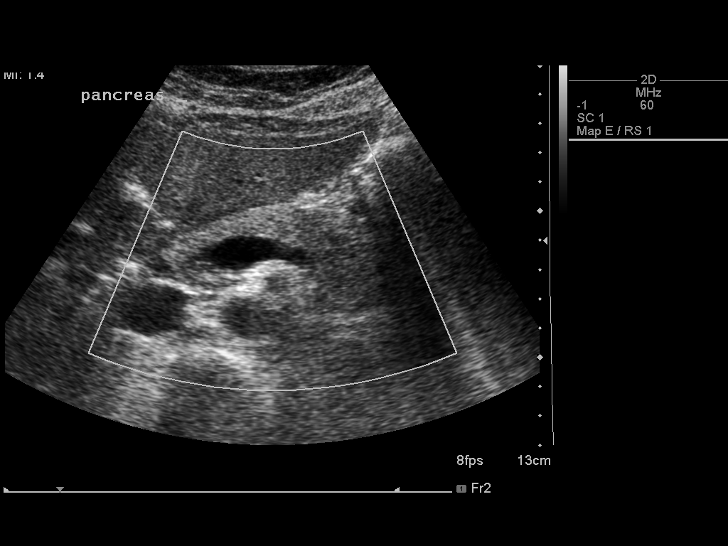
[im 6/69]
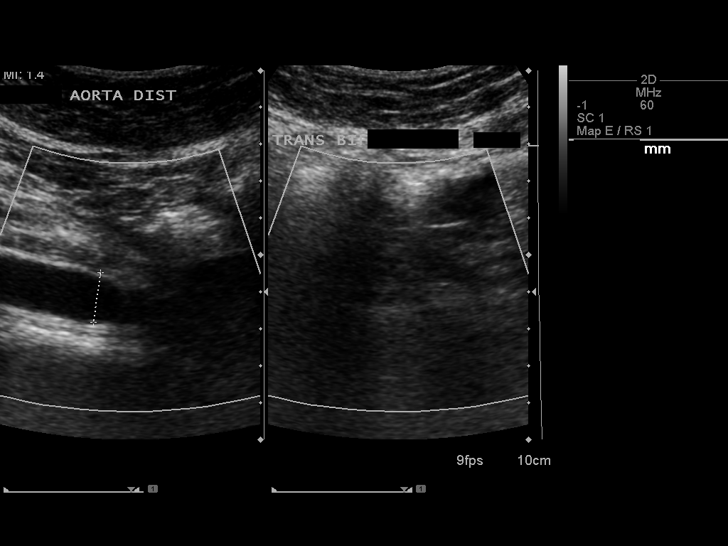
[im 12/69]
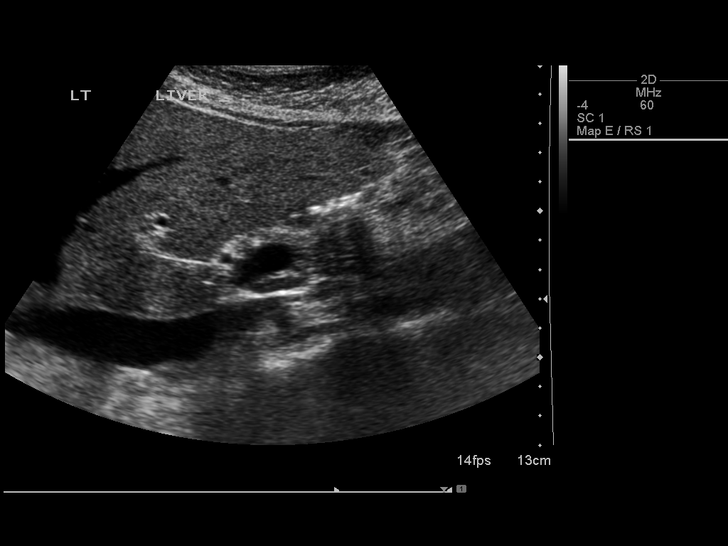
[im 18/69]
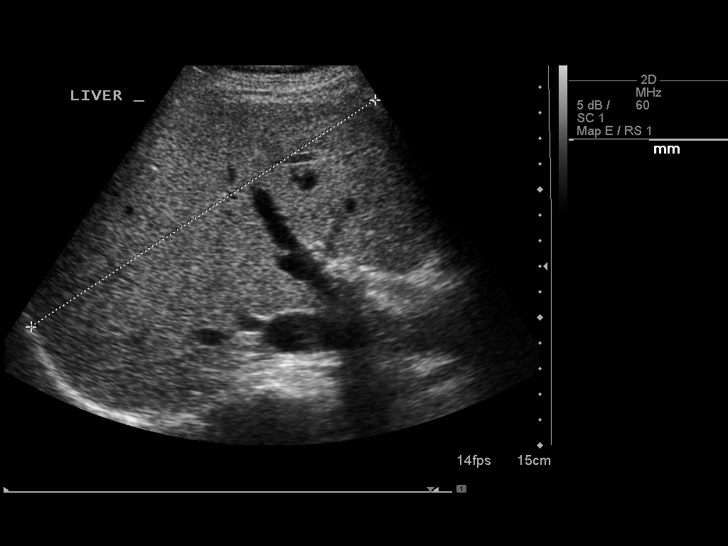
[im 23/69]
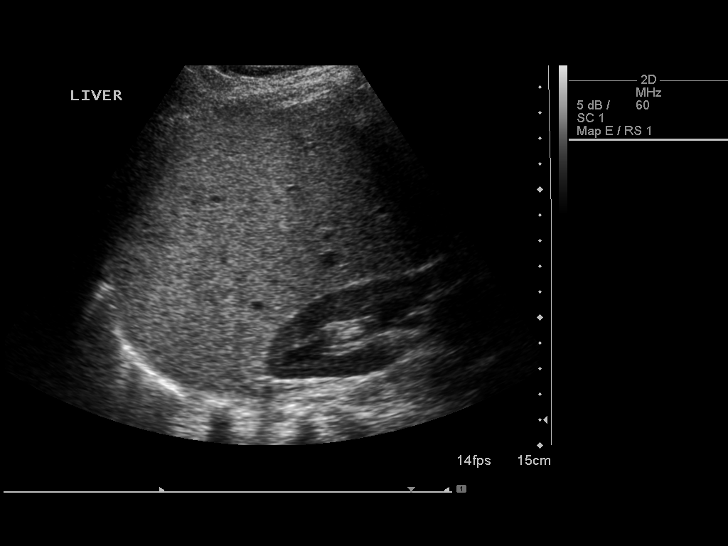
[im 26/69]
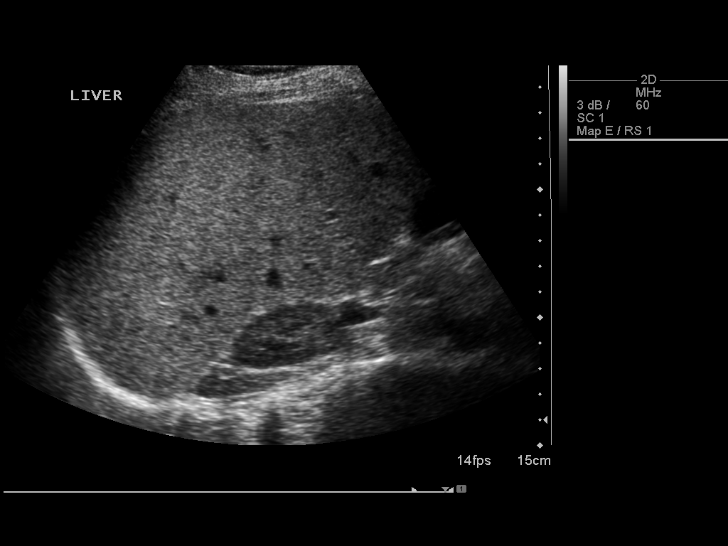
[im 32/69]
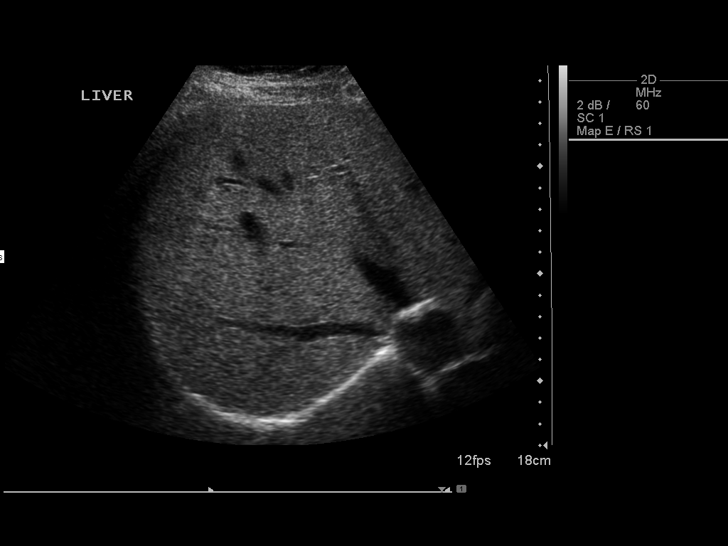
[im 37/69]
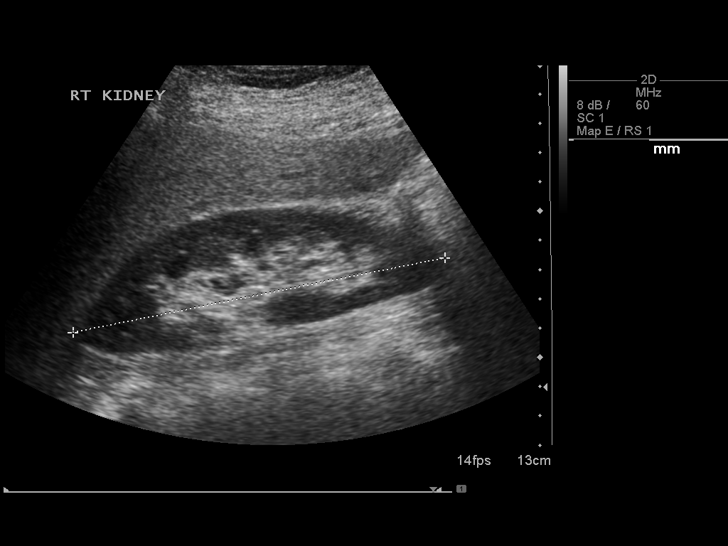
[im 43/69]
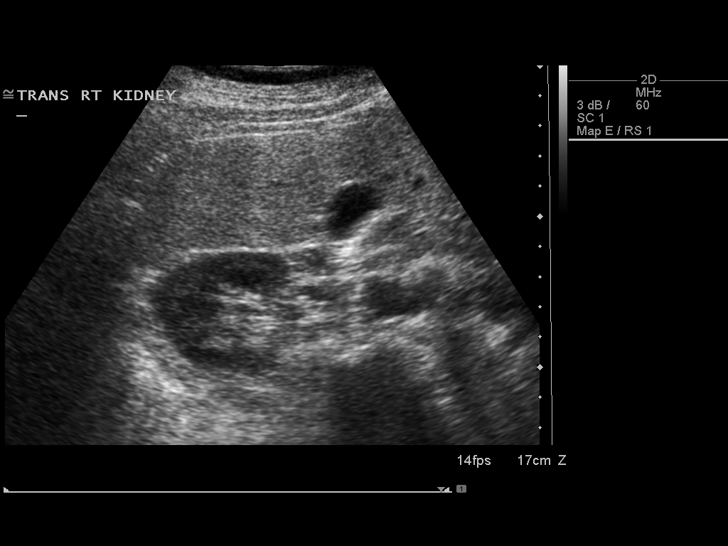
[im 46/69]
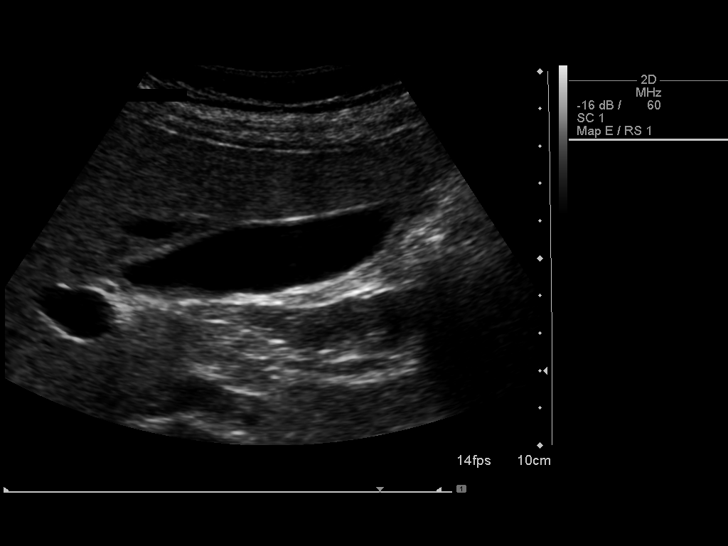
[im 52/69]
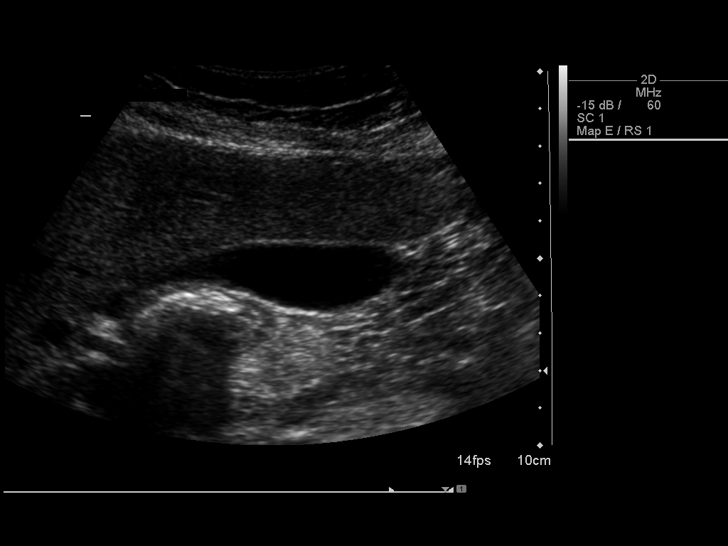
[im 57/69]
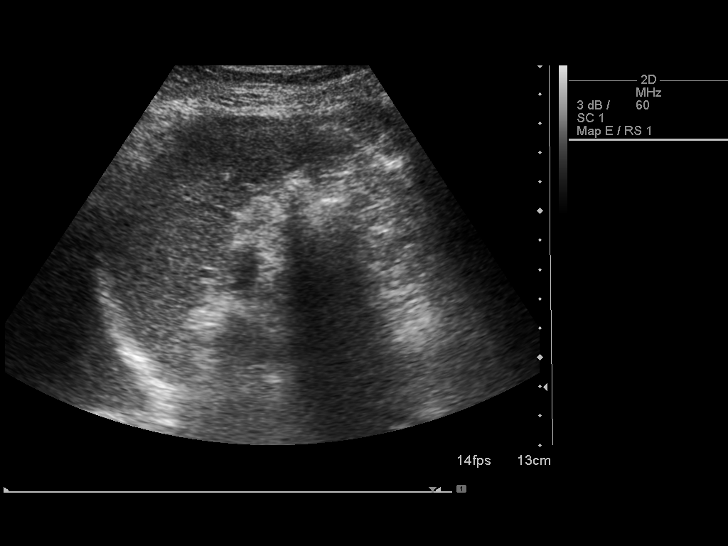
[im 63/69]
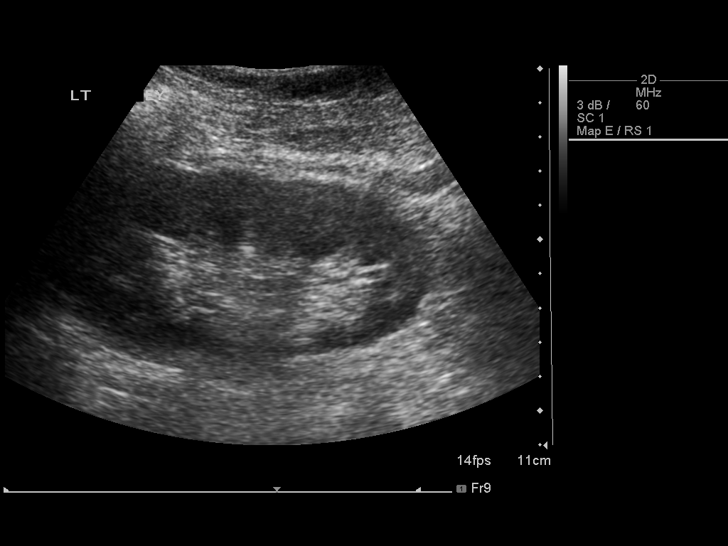
[im 69/69]
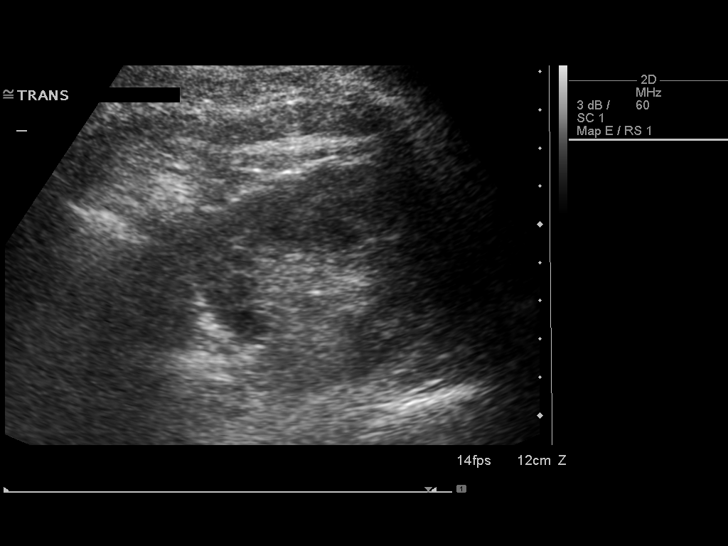

[14 of 25 positions shown; findings below may reference images not displayed]

FINDINGS: Gallbladder:  No gallstones, gallbladder wall thickening, or
pericholecystic fluid.

Common Bile Duct:  Within normal limits in caliber.

Liver: Slight increased echogenicity, suspicious for mild hepatic
steatosis.  No focal hepatic abnormality.  Hepatic and portal veins
are patent.  No biliary dilatation or focal lesion.

IVC:  Appears normal.

Pancreas:  No abnormality identified.

Spleen:  Within normal limits in size and echotexture.

Right kidney:  Normal in size and parenchymal echogenicity.  No
evidence of mass or hydronephrosis.

Left kidney:  Normal in size and parenchymal echogenicity.  No
evidence of mass or hydronephrosis.

Abdominal Aorta:  No aneurysm identified.
IMPRESSION: No acute finding by ultrasound.  Suspect mild hepatic steatosis.

## 2014-11-17 ENCOUNTER — Ambulatory Visit (INDEPENDENT_AMBULATORY_CARE_PROVIDER_SITE_OTHER): Payer: Medicaid Other | Admitting: Family Medicine

## 2014-11-17 VITALS — BP 143/99 | HR 101 | Temp 97.9°F | Ht 65.0 in | Wt 157.5 lb

## 2014-11-17 DIAGNOSIS — J4521 Mild intermittent asthma with (acute) exacerbation: Secondary | ICD-10-CM

## 2014-11-17 DIAGNOSIS — J4530 Mild persistent asthma, uncomplicated: Secondary | ICD-10-CM

## 2014-11-17 DIAGNOSIS — J069 Acute upper respiratory infection, unspecified: Secondary | ICD-10-CM | POA: Diagnosis present

## 2014-11-17 DIAGNOSIS — R000 Tachycardia, unspecified: Secondary | ICD-10-CM | POA: Insufficient documentation

## 2014-11-17 DIAGNOSIS — I10 Essential (primary) hypertension: Secondary | ICD-10-CM

## 2014-11-17 MED ORDER — METOPROLOL SUCCINATE ER 25 MG PO TB24
25.0000 mg | ORAL_TABLET | Freq: Every day | ORAL | Status: DC
Start: 2014-11-17 — End: 2014-12-10

## 2014-11-17 MED ORDER — ALBUTEROL SULFATE HFA 108 (90 BASE) MCG/ACT IN AERS: 2.0000 | INHALATION_SPRAY | RESPIRATORY_TRACT | Status: DC | PRN

## 2014-11-17 NOTE — Patient Instructions (Signed)
Thank you for coming in today!  - Start taking metoprolol XL once a day until we follow up in about 2 - 3 weeks.  - Take your albuterol inhaler as needed for wheezing or shortness of breath.   Our clinic's number is [phone]. Feel free to call any time with questions or concerns. We will answer any questions after hours with our 24-hour emergency line at that number as well.   - Dr. Jarvis Newcomer

## 2014-11-17 NOTE — Progress Notes (Signed)
Subjective: [name] is a 39 y.o. female presenting for breathing problems.  She endorses a general anxiety for months that causes her to at times feel short of breath without any exertion. Also has some palpitations at this time and this makes her more nervous. She denies fever, cough, chest pain, diaphoresis, nausea, vomiting, diarrhea. She occasionally notices wheezing and takes albuterol roughly once per week for this.   - Current smoker  Objective: BP 143/99 mmHg  Pulse 101  Temp(Src) 97.9 F (36.6 C) (Oral)  Ht  (1.651 m)  Wt 157 lb 8 oz (71.442 kg)  BMI 26.21 kg/m2 Gen: Nervous but well-appearing 39 y.o. female in no distress CV: Regular tachycardia, no murmur; radial, DP and PT pulses 2+ bilaterally; no LE edema, no JVD, cap refill < 2 sec. Pulm: Non-labored breathing ambient air; clear to auscultation bilaterally.  Psych: Neatly groomed and appropriately dressed. Maintains good eye contact and is cooperative and attentive. Speech is normal in volume and rapid in rate. Mood is "anxious" with a broad affect. No suicidal or homicidal ideation. Does not appear to be responding to any internal stimuli.   Assessment/Plan: [name] is a 39 y.o. female here for anxiety.  See problem list for plan.

## 2014-11-18 ENCOUNTER — Emergency Department (HOSPITAL_COMMUNITY): Payer: Medicaid Other

## 2014-11-18 ENCOUNTER — Emergency Department (HOSPITAL_COMMUNITY)
Admission: EM | Admit: 2014-11-18 | Discharge: 2014-11-19 | Disposition: A | Discharge status: Home / Self Care | Payer: Medicaid Other | Attending: Emergency Medicine | Admitting: Emergency Medicine

## 2014-11-18 ENCOUNTER — Encounter (HOSPITAL_COMMUNITY): Payer: Self-pay | Admitting: Nurse Practitioner

## 2014-11-18 ENCOUNTER — Other Ambulatory Visit: Payer: Self-pay

## 2014-11-18 DIAGNOSIS — R079 Chest pain, unspecified: Secondary | ICD-10-CM | POA: Insufficient documentation

## 2014-11-18 DIAGNOSIS — Z79899 Other long term (current) drug therapy: Secondary | ICD-10-CM | POA: Diagnosis not present

## 2014-11-18 DIAGNOSIS — Z8619 Personal history of other infectious and parasitic diseases: Secondary | ICD-10-CM | POA: Insufficient documentation

## 2014-11-18 DIAGNOSIS — R1010 Upper abdominal pain, unspecified: Secondary | ICD-10-CM | POA: Diagnosis not present

## 2014-11-18 DIAGNOSIS — J449 Chronic obstructive pulmonary disease, unspecified: Secondary | ICD-10-CM | POA: Diagnosis not present

## 2014-11-18 DIAGNOSIS — J45909 Unspecified asthma, uncomplicated: Secondary | ICD-10-CM | POA: Insufficient documentation

## 2014-11-18 DIAGNOSIS — R1013 Epigastric pain: Secondary | ICD-10-CM | POA: Diagnosis present

## 2014-11-18 DIAGNOSIS — Z87440 Personal history of urinary (tract) infections: Secondary | ICD-10-CM | POA: Insufficient documentation

## 2014-11-18 DIAGNOSIS — Z87448 Personal history of other diseases of urinary system: Secondary | ICD-10-CM | POA: Insufficient documentation

## 2014-11-18 DIAGNOSIS — F419 Anxiety disorder, unspecified: Secondary | ICD-10-CM | POA: Diagnosis not present

## 2014-11-18 DIAGNOSIS — Z720 Tobacco use: Secondary | ICD-10-CM | POA: Insufficient documentation

## 2014-11-18 LAB — URINALYSIS, ROUTINE W REFLEX MICROSCOPIC
Bilirubin Urine: NEGATIVE
Glucose, UA: NEGATIVE mg/dL
Hgb urine dipstick: NEGATIVE
Ketones, ur: NEGATIVE mg/dL
Leukocytes, UA: NEGATIVE
NITRITE: NEGATIVE
PH: 5 (ref 5.0–8.0)
Protein, ur: NEGATIVE mg/dL
SPECIFIC GRAVITY, URINE: 1.027 (ref 1.005–1.030)
UROBILINOGEN UA: 0.2 mg/dL (ref 0.0–1.0)

## 2014-11-18 LAB — TYPE AND SCREEN
ABO/RH(D): A POS
ANTIBODY SCREEN: NEGATIVE

## 2014-11-18 LAB — CBC
HEMATOCRIT: 40 % (ref 36.0–46.0)
Hemoglobin: 13.4 g/dL (ref 12.0–15.0)
MCH: 33.3 pg (ref 26.0–34.0)
MCHC: 33.5 g/dL (ref 30.0–36.0)
MCV: 99.5 fL (ref 78.0–100.0)
PLATELETS: 332 10*3/uL (ref 150–400)
RBC: 4.02 MIL/uL (ref 3.87–5.11)
RDW: 13.5 % (ref 11.5–15.5)
WBC: 6.4 10*3/uL (ref 4.0–10.5)

## 2014-11-18 LAB — COMPREHENSIVE METABOLIC PANEL
ALBUMIN: 3.7 g/dL (ref 3.5–5.0)
ALT: 86 U/L — AB (ref 14–54)
AST: 85 U/L — AB (ref 15–41)
Alkaline Phosphatase: 122 U/L (ref 38–126)
Anion gap: 9 (ref 5–15)
BILIRUBIN TOTAL: 0.3 mg/dL (ref 0.3–1.2)
BUN: 17 mg/dL (ref 6–20)
CO2: 23 mmol/L (ref 22–32)
CREATININE: 0.67 mg/dL (ref 0.44–1.00)
Calcium: 9.1 mg/dL (ref 8.9–10.3)
Chloride: 105 mmol/L (ref 101–111)
GFR calc Af Amer: >60 mL/min (ref >60)
GLUCOSE: 99 mg/dL (ref 65–99)
POTASSIUM: 4.4 mmol/L (ref 3.5–5.1)
Sodium: 137 mmol/L (ref 135–145)
TOTAL PROTEIN: 7.2 g/dL (ref 6.5–8.1)

## 2014-11-18 LAB — LIPASE, BLOOD: Lipase: 29 U/L (ref 22–51)

## 2014-11-18 LAB — I-STAT TROPONIN, ED: TROPONIN I, POC: 0 ng/mL (ref 0.00–0.08)

## 2014-11-18 LAB — ACETAMINOPHEN LEVEL

## 2014-11-18 MED ORDER — SUCRALFATE 1 GM/10ML PO SUSP: 1.0000 g | Freq: Three times a day (TID) | ORAL | Status: DC

## 2014-11-18 MED ORDER — OMEPRAZOLE 20 MG PO CPDR: 20.0000 mg | DELAYED_RELEASE_CAPSULE | Freq: Every day | ORAL | Status: DC

## 2014-11-18 MED ORDER — GI COCKTAIL ~~LOC~~
30.0000 mL | Freq: Once | ORAL | Status: AC
  Administered 2014-11-18: 30 mL via ORAL
  Filled 2014-11-18: qty 30

## 2014-11-18 NOTE — ED Provider Notes (Signed)
CSN: [id number]     Arrival date & time 11/18/14  1824 History   First MD Initiated Contact with Patient 11/18/14 2106     Chief Complaint  Patient presents with  . Abdominal Pain  . Chest Pain  . GI Bleeding     (Consider location/radiation/quality/duration/timing/severity/associated sxs/prior Treatment) HPI Comments: Patient presents to the emergency department with chief complaint of epigastric pain. Patient states that the pain started yesterday. She denies any associated symptoms. She states that she takes "a lot of Goody powder" for chronic neck pain. She denies any association with eating or drinking. She denies any nausea, or vomiting. She denies any dark stools or hematochezia. Patient states that she did see 1-2 drops of bright red blood in her underwear yesterday, but none today. She states that she has had a hysterectomy. Additionally, she complains of feeling sleepy and "out of it" since starting a new blood pressure medicine yesterday. There are no further symptoms.  The history is provided by the patient. No language interpreter was used.    Past Medical History  Diagnosis Date  . Asthma   . Emphysema   . COPD (chronic obstructive pulmonary disease)   . Fibromyalgia, primary   . Anxiety   . Depressed   . Nephrolithiasis   . UTI (lower urinary tract infection)   . Kidney stone   . Hepatitis C   . Dysrhythmia   . Fibromyalgia   . Carpal tunnel syndrome    Past Surgical History  Procedure Laterality Date  . Abdominal hysterectomy    . Orif ankle fracture  02/03/2012    Procedure: OPEN REDUCTION INTERNAL FIXATION (ORIF) ANKLE FRACTURE;  Surgeon: Nadara Mustard, MD;  Location: [facility] OR;  Service: Orthopedics;  Laterality: Right;  Open Reduction Internal Fixation Right Ankle  . Tubal ligation    . Wisdom tooth extraction    . Orif clavicular fracture Left 08/29/2012    Dr Ophelia Charter  . Orif clavicular fracture Left 08/29/2012    Procedure: OPEN REDUCTION INTERNAL FIXATION  (ORIF) CLAVICULAR FRACTURE;  Surgeon: Eldred Manges, MD;  Location: [facility] OR;  Service: Orthopedics;  Laterality: Left;  Open Reduction Internal Fixation Left Clavicle Fracture   Family History  Problem Relation Age of Onset  . Alcohol abuse Father   . Cirrhosis Father   . Cancer Father   . Hyperthyroidism Sister   . Bipolar disorder Sister   . Kidney disease Brother   . Heart attack Mother   . Hypertension Mother    Social History  Substance Use Topics  . Smoking status: Current Every Day Smoker -- 0.40 packs/day for 10 years    Types: Cigarettes  . Smokeless tobacco: Never Used  . Alcohol Use: No     Comment: 3 months ago   OB History    Gravida Para Term Preterm AB TAB SAB Ectopic Multiple Living            4      Obstetric Comments   S/p hysterectomy     Review of Systems  Constitutional: Negative for fever and chills.  Respiratory: Negative for shortness of breath.   Cardiovascular: Negative for chest pain.  Gastrointestinal: Positive for abdominal pain. Negative for nausea, vomiting, diarrhea and constipation.  Genitourinary: Negative for dysuria.  All other systems reviewed and are negative.     Allergies  Eggs or egg-derived products; Milk-related compounds; and Sulfa antibiotics  Home Medications   Prior to Admission medications   Medication Sig Start Date  End Date Taking? Authorizing Provider  albuterol (PROVENTIL HFA;VENTOLIN HFA) 108 (90 BASE) MCG/ACT inhaler Inhale 2 puffs into the lungs every 4 (four) hours as needed for wheezing. 11/17/14   Tyrone Nine, MD  metoprolol succinate (TOPROL-XL) 25 MG 24 hr tablet Take 1 tablet (25 mg total) by mouth daily. 11/17/14   Tyrone Nine, MD  omeprazole (PRILOSEC) 20 MG capsule Take 1 capsule (20 mg total) by mouth daily. 11/18/14   Roxy Horseman, PA-C  sucralfate (CARAFATE) 1 GM/10ML suspension Take 10 mLs (1 g total) by mouth 4 (four) times daily -  with meals and at bedtime. 11/18/14   Roxy Horseman, PA-C   venlafaxine XR (EFFEXOR-XR) 75 MG 24 hr capsule Take 225 mg by mouth every morning. Patient states she takes 3 tabs a day per patient 06/20/14   Historical Provider, MD   BP 134/92 mmHg  Pulse 79  Temp(Src) 97.9 F (36.6 C) (Oral)  Resp 16  SpO2 100% Physical Exam  Constitutional: She is oriented to person, place, and time. She appears well-developed and well-nourished.  HENT:  Head: Normocephalic and atraumatic.  Eyes: Conjunctivae and EOM are normal. Pupils are equal, round, and reactive to light.  Neck: Normal range of motion. Neck supple.  Cardiovascular: Normal rate and regular rhythm.  Exam reveals no gallop and no friction rub.   No murmur heard. Pulmonary/Chest: Effort normal and breath sounds normal. No respiratory distress. She has no wheezes. She has no rales. She exhibits no tenderness.  Abdominal: Soft. Bowel sounds are normal. She exhibits no distension and no mass. There is tenderness. There is no rebound and no guarding.  Mild epigastric tenderness, no other focal abdominal tenderness  Musculoskeletal: Normal range of motion. She exhibits no edema or tenderness.  Neurological: She is alert and oriented to person, place, and time.  Skin: Skin is warm and dry.  Psychiatric: She has a normal mood and affect. Her behavior is normal. Judgment and thought content normal.  Nursing note and vitals reviewed.   ED Course  Procedures (including critical care time) Results for orders placed or performed during the hospital encounter of 11/18/14  Comprehensive metabolic panel  Result Value Ref Range   Sodium 137 135 - 145 mmol/L   Potassium 4.4 3.5 - 5.1 mmol/L   Chloride 105 101 - 111 mmol/L   CO2 23 22 - 32 mmol/L   Glucose, Bld 99 65 - 99 mg/dL   BUN 17 6 - 20 mg/dL   Creatinine, Ser 1.61 0.44 - 1.00 mg/dL   Calcium 9.1 8.9 - 09.6 mg/dL   Total Protein 7.2 6.5 - 8.1 g/dL   Albumin 3.7 3.5 - 5.0 g/dL   AST 85 (H) 15 - 41 U/L   ALT 86 (H) 14 - 54 U/L   Alkaline  Phosphatase 122 38 - 126 U/L   Total Bilirubin 0.3 0.3 - 1.2 mg/dL   GFR calc non Af Amer >60 >60 mL/min   GFR calc Af Amer >60 >60 mL/min   Anion gap 9 5 - 15  CBC  Result Value Ref Range   WBC 6.4 4.0 - 10.5 K/uL   RBC 4.02 3.87 - 5.11 MIL/uL   Hemoglobin 13.4 12.0 - 15.0 g/dL   HCT 04.5 40.9 - 81.1 %   MCV 99.5 78.0 - 100.0 fL   MCH 33.3 26.0 - 34.0 pg   MCHC 33.5 30.0 - 36.0 g/dL   RDW 91.4 78.2 - 95.6 %   Platelets 332 150 -  400 K/uL  Lipase, blood  Result Value Ref Range   Lipase 29 22 - 51 U/L  Urinalysis, Routine w reflex microscopic (not at [facility])  Result Value Ref Range   Color, Urine YELLOW YELLOW   APPearance CLEAR CLEAR   Specific Gravity, Urine 1.027 1.005 - 1.030   pH 5.0 5.0 - 8.0   Glucose, UA NEGATIVE NEGATIVE mg/dL   Hgb urine dipstick NEGATIVE NEGATIVE   Bilirubin Urine NEGATIVE NEGATIVE   Ketones, ur NEGATIVE NEGATIVE mg/dL   Protein, ur NEGATIVE NEGATIVE mg/dL   Urobilinogen, UA 0.2 0.0 - 1.0 mg/dL   Nitrite NEGATIVE NEGATIVE   Leukocytes, UA NEGATIVE NEGATIVE  Acetaminophen level  Result Value Ref Range   Acetaminophen (Tylenol), Serum <10 (L) 10 - 30 ug/mL  I-stat troponin, ED  Result Value Ref Range   Troponin i, poc 0.00 0.00 - 0.08 ng/mL   Comment 3          Type and screen  Result Value Ref Range   ABO/RH(D) A POS    Antibody Screen NEG    Sample Expiration 11/21/2014    Dg Chest 2 View  11/18/2014   CLINICAL DATA:  Patient with left chest pain and shortness of breath.  EXAM: CHEST  2 VIEW  COMPARISON:  Chest radiograph 08/05/2014  FINDINGS: Stable cardiac and mediastinal contours. No consolidative pulmonary opacities. No pleural effusion or pneumothorax. Plate and screw fixation of the left clavicle.  IMPRESSION: No acute cardiopulmonary process.   Electronically Signed   By: Annia Belt M.D.   On: 11/18/2014 22:01     Imaging Review Dg Chest 2 View  11/18/2014   CLINICAL DATA:  Patient with left chest pain and shortness of breath.   EXAM: CHEST  2 VIEW  COMPARISON:  Chest radiograph 08/05/2014  FINDINGS: Stable cardiac and mediastinal contours. No consolidative pulmonary opacities. No pleural effusion or pneumothorax. Plate and screw fixation of the left clavicle.  IMPRESSION: No acute cardiopulmonary process.   Electronically Signed   By: Annia Belt M.D.   On: 11/18/2014 22:01   I have personally reviewed and evaluated these images and lab results as part of my medical decision-making.   EKG Interpretation None      MDM   Final diagnoses:  Pain of upper abdomen    Patient with epigastric tenderness that started yesterday. She describes as a sharp aching sensation. She had significant relief with a GI cocktail. I suspect that she may have peptic ulcer disease versus GERD. Regarding the 2 drops of blood she saw on her underwear yesterday, I will have her follow-up with her doctor for this. She may benefit from seeing gastroenterology. Her hemoglobin is stable, and she is not having any shortness of breath or dizziness. Patient states that she be discharged. I will give her Carafate and omeprazole. Encouraged patient to follow-up with PCP. Advised patient to discontinue her blood pressure medicine if it is making her feel bad, and to discuss this with her primary care provider.    Roxy Horseman, PA-C 11/18/14 2348  Samuel Jester, DO 11/21/14 1355

## 2014-11-18 NOTE — Discharge Instructions (Signed)
Abdominal Pain °Many things can cause abdominal pain. Usually, abdominal pain is not caused by a disease and will improve without treatment. It can often be observed and treated at home. Your health care provider will do a physical exam and possibly order blood tests and X-rays to help determine the seriousness of your pain. However, in many cases, more time must pass before a clear cause of the pain can be found. Before that point, your health care provider may not know if you need more testing or further treatment. °HOME CARE INSTRUCTIONS  °Monitor your abdominal pain for any changes. The following actions may help to alleviate any discomfort you are experiencing: °· Only take over-the-counter or prescription medicines as directed by your health care provider. °· Do not take laxatives unless directed to do so by your health care provider. °· Try a clear liquid diet (broth, tea, or water) as directed by your health care provider. Slowly move to a bland diet as tolerated. °SEEK MEDICAL CARE IF: °· You have unexplained abdominal pain. °· You have abdominal pain associated with nausea or diarrhea. °· You have pain when you urinate or have a bowel movement. °· You experience abdominal pain that wakes you in the night. °· You have abdominal pain that is worsened or improved by eating food. °· You have abdominal pain that is worsened with eating fatty foods. °· You have a fever. °SEEK IMMEDIATE MEDICAL CARE IF:  °· Your pain does not go away within 2 hours. °· You keep throwing up (vomiting). °· Your pain is felt only in portions of the abdomen, such as the right side or the left lower portion of the abdomen. °· You pass bloody or black tarry stools. °MAKE SURE YOU: °· Understand these instructions.   °· Will watch your condition.   °· Will get help right away if you are not doing well or get worse.   °Document Released: 12/22/2004 Document Revised: 03/19/2013 Document Reviewed: 11/21/2012 °[employer]® Patient Information  ©2015 [employer]. This information is not intended to replace advice given to you by your health care provider. Make sure you discuss any questions you have with your health care provider. °Peptic Ulcer °A peptic ulcer is a sore in the lining of your esophagus (esophageal ulcer), stomach (gastric ulcer), or in the first part of your small intestine (duodenal ulcer). The ulcer causes erosion into the deeper tissue. °CAUSES  °Normally, the lining of the stomach and the small intestine protects itself from the acid that digests food. The protective lining can be damaged by: °· An infection caused by a bacterium called Helicobacter pylori (H. pylori). °· Regular use of nonsteroidal anti-inflammatory drugs (NSAIDs), such as ibuprofen or aspirin. °· Smoking tobacco. °Other risk factors include being older than 50, drinking alcohol excessively, and having a family history of ulcer disease.  °SYMPTOMS  °· Burning pain or gnawing in the area between the chest and the belly button. °· Heartburn. °· Nausea and vomiting. °· Bloating. °The pain can be worse on an empty stomach and at night. If the ulcer results in bleeding, it can cause: °· Black, tarry stools. °· Vomiting of bright red blood. °· Vomiting of coffee-ground-looking materials. °DIAGNOSIS  °A diagnosis is usually made based upon your history and an exam. Other tests and procedures may be performed to find the cause of the ulcer. Finding a cause will help determine the best treatment. Tests and procedures may include: °· Blood tests, stool tests, or breath tests to check for the bacterium H.   pylori. °· An upper gastrointestinal (GI) series of the esophagus, stomach, and small intestine. °· An endoscopy to examine the esophagus, stomach, and small intestine. °· A biopsy. °TREATMENT  °Treatment may include: °· Eliminating the cause of the ulcer, such as smoking, NSAIDs, or alcohol. °· Medicines to reduce the amount of acid in your digestive tract. °· Antibiotic  medicines if the ulcer is caused by the H. pylori bacterium. °· An upper endoscopy to treat a bleeding ulcer. °· Surgery if the bleeding is severe or if the ulcer created a hole somewhere in the digestive system. °HOME CARE INSTRUCTIONS  °· Avoid tobacco, alcohol, and caffeine. Smoking can increase the acid in the stomach, and continued smoking will impair the healing of ulcers. °· Avoid foods and drinks that seem to cause discomfort or aggravate your ulcer. °· Only take medicines as directed by your caregiver. Do not substitute over-the-counter medicines for prescription medicines without talking to your caregiver. °· Keep any follow-up appointments and tests as directed. °SEEK MEDICAL CARE IF:  °· Your do not improve within 7 days of starting treatment. °· You have ongoing indigestion or heartburn. °SEEK IMMEDIATE MEDICAL CARE IF:  °· You have sudden, sharp, or persistent abdominal pain. °· You have bloody or dark black, tarry stools. °· You vomit blood or vomit that looks like coffee grounds. °· You become light-headed, weak, or feel faint. °· You become sweaty or clammy. °MAKE SURE YOU:  °· Understand these instructions. °· Will watch your condition. °· Will get help right away if you are not doing well or get worse. °Document Released: 03/11/2000 Document Revised: 07/29/2013 Document Reviewed: 10/12/2011 °ExitCare® Patient Information ©2015 [employer]. This information is not intended to replace advice given to you by your health care provider. Make sure you discuss any questions you have with your health care provider. ° °

## 2014-11-18 NOTE — ED Notes (Signed)
She is concerned because she has felt very sleepy and "out of it" since starting a new BP medication yesterday. She also is worried because she noticed bright red blood in her underwear yesterday but has had a hysterectomy. She has been taking goody powder more frequently lately for neck pain. She c/o abd and chest pain now. A&Ox4, resp e/u

## 2014-11-18 NOTE — ED Notes (Signed)
Patient transported to X-ray 

## 2014-11-18 NOTE — ED Notes (Signed)
Pt. Unable to void at the moment. 

## 2014-11-19 NOTE — ED Notes (Signed)
Discharge instructions/prescriptions reviewed with patient/spouse. Understanding verbalized. Refused wheelchair at time of discharge. Denies pain. No distress noted at time of discharge.

## 2014-11-20 ENCOUNTER — Encounter: Payer: Self-pay | Admitting: Family Medicine

## 2014-11-20 NOTE — Assessment & Plan Note (Signed)
Borderline in past and high today with tachycardia, will Rx beta blocker and follow up in 2 weeks.

## 2014-11-20 NOTE — Assessment & Plan Note (Signed)
Doubt exacerbation causing breathing problems. Will refill inhaler per pt request.

## 2014-12-08 ENCOUNTER — Ambulatory Visit: Payer: Medicaid Other | Admitting: Family Medicine

## 2014-12-10 ENCOUNTER — Encounter: Payer: Self-pay | Admitting: Gastroenterology

## 2014-12-10 ENCOUNTER — Encounter: Payer: Self-pay | Admitting: Family Medicine

## 2014-12-10 ENCOUNTER — Ambulatory Visit (INDEPENDENT_AMBULATORY_CARE_PROVIDER_SITE_OTHER): Payer: Medicaid Other | Admitting: Family Medicine

## 2014-12-10 VITALS — BP 138/86 | HR 83 | Temp 98.1°F | Wt 157.0 lb

## 2014-12-10 DIAGNOSIS — R1013 Epigastric pain: Secondary | ICD-10-CM | POA: Diagnosis not present

## 2014-12-10 DIAGNOSIS — J069 Acute upper respiratory infection, unspecified: Secondary | ICD-10-CM | POA: Diagnosis present

## 2014-12-10 DIAGNOSIS — R000 Tachycardia, unspecified: Secondary | ICD-10-CM

## 2014-12-10 DIAGNOSIS — B182 Chronic viral hepatitis C: Secondary | ICD-10-CM

## 2014-12-10 DIAGNOSIS — I10 Essential (primary) hypertension: Secondary | ICD-10-CM

## 2014-12-10 DIAGNOSIS — J4521 Mild intermittent asthma with (acute) exacerbation: Secondary | ICD-10-CM | POA: Diagnosis not present

## 2014-12-10 MED ORDER — SUCRALFATE 1 GM/10ML PO SUSP: 1.0000 g | Freq: Three times a day (TID) | ORAL | Status: DC

## 2014-12-10 MED ORDER — SUCRALFATE 1 GM/10ML PO SUSP
1.0000 g | Freq: Three times a day (TID) | ORAL | Status: DC
Start: 2014-12-10 — End: 2015-04-16

## 2014-12-10 MED ORDER — OMEPRAZOLE 20 MG PO CPDR: 20.0000 mg | DELAYED_RELEASE_CAPSULE | Freq: Every day | ORAL | Status: DC

## 2014-12-10 MED ORDER — METOPROLOL SUCCINATE ER 25 MG PO TB24: 25.0000 mg | ORAL_TABLET | Freq: Every day | ORAL | Status: DC

## 2014-12-10 NOTE — Patient Instructions (Signed)
Refilled carafate, metoprolol, omeprazole Referring to GI doctor - someone will call with appointment Follow up with Dr. Richarda Blade in 3 months for your blood pressure, sooner if needed  Be well, Dr. Pollie Meyer

## 2014-12-10 NOTE — Progress Notes (Signed)
  HPI:  Pt presents to f/u on HTN. Seen 8/22 and started on metoprolol xl  daily for elevated BP and mild tachycardia. Pt reports compliance with the medication. It has made her feel much better. Felt sluggish for the first few days of medicine but now has much more energy. Also thinks it has helped her anxiety. Checks BP at [facility] and gets 130's/80's.  Also requests refill of carafate. Was seen in ED on 8/23 with abdominal pain, dx'd with likely peptic ulcer disease vs GERD. Has been taking carafate before meals, with good relief. Would be willing to see GI. Has never had EGD before. No blood in stools.  ROS: See HPI.  PMFSH: hx asthma, bipolar disorder, hep C, HTN  PHYSICAL EXAM: BP 138/86 mmHg  Pulse 83  Temp(Src) 98.1 F (36.7 C) (Oral)  Wt 157 lb (71.215 kg)  SpO2 98% Gen: NAD, pleasant, cooperative HEENT: NCAT Heart: RRR no murmur Lungs: CTAB NWOB Neuro: grossly nonfocal speech normal Abd: soft NTTP, no masses or organomegaly, no peritoneal signs or guarding Ext: no edema  ASSESSMENT/PLAN:  HTN (hypertension) At goal. Continue metoprolol. Refill given today. F/u with PCP in 3 months.  Abdominal pain, epigastric Seen in ED with concern for peptic ulcer disease. Improved with carafate. Refill carafate & omeprazole today. Refer to GI for possible EGD.  Hepatitis C Did not discuss with pt today, but would consider future referral to ID to eval candidacy for Hep C treatment.   FOLLOW UP: F/u in 3 months for HTN Referring to GI  [place] J. Pollie Meyer, MD [facility]

## 2014-12-10 NOTE — Assessment & Plan Note (Signed)
At goal. Continue metoprolol. Refill given today. F/u with PCP in 3 months.

## 2014-12-10 NOTE — Assessment & Plan Note (Signed)
Seen in ED with concern for peptic ulcer disease. Improved with carafate. Refill carafate & omeprazole today. Refer to GI for possible EGD.

## 2014-12-10 NOTE — Assessment & Plan Note (Signed)
Did not discuss with pt today, but would consider future referral to ID to eval candidacy for Hep C treatment.

## 2015-01-11 ENCOUNTER — Encounter (HOSPITAL_COMMUNITY): Payer: Self-pay | Admitting: Nurse Practitioner

## 2015-01-11 ENCOUNTER — Emergency Department (HOSPITAL_COMMUNITY)
Admission: EM | Admit: 2015-01-11 | Discharge: 2015-01-11 | Disposition: A | Discharge status: Home / Self Care | Payer: Medicaid Other | Attending: Emergency Medicine | Admitting: Emergency Medicine

## 2015-01-11 ENCOUNTER — Emergency Department (HOSPITAL_COMMUNITY): Payer: Medicaid Other

## 2015-01-11 DIAGNOSIS — Y9389 Activity, other specified: Secondary | ICD-10-CM | POA: Diagnosis not present

## 2015-01-11 DIAGNOSIS — Z8679 Personal history of other diseases of the circulatory system: Secondary | ICD-10-CM | POA: Insufficient documentation

## 2015-01-11 DIAGNOSIS — S62632A Displaced fracture of distal phalanx of right middle finger, initial encounter for closed fracture: Secondary | ICD-10-CM | POA: Insufficient documentation

## 2015-01-11 DIAGNOSIS — Z8659 Personal history of other mental and behavioral disorders: Secondary | ICD-10-CM | POA: Diagnosis not present

## 2015-01-11 DIAGNOSIS — J439 Emphysema, unspecified: Secondary | ICD-10-CM | POA: Diagnosis not present

## 2015-01-11 DIAGNOSIS — Z87440 Personal history of urinary (tract) infections: Secondary | ICD-10-CM | POA: Insufficient documentation

## 2015-01-11 DIAGNOSIS — Y9289 Other specified places as the place of occurrence of the external cause: Secondary | ICD-10-CM | POA: Insufficient documentation

## 2015-01-11 DIAGNOSIS — Z8619 Personal history of other infectious and parasitic diseases: Secondary | ICD-10-CM | POA: Insufficient documentation

## 2015-01-11 DIAGNOSIS — Z720 Tobacco use: Secondary | ICD-10-CM | POA: Diagnosis not present

## 2015-01-11 DIAGNOSIS — Z8669 Personal history of other diseases of the nervous system and sense organs: Secondary | ICD-10-CM | POA: Diagnosis not present

## 2015-01-11 DIAGNOSIS — Z8739 Personal history of other diseases of the musculoskeletal system and connective tissue: Secondary | ICD-10-CM | POA: Diagnosis not present

## 2015-01-11 DIAGNOSIS — W01198A Fall on same level from slipping, tripping and stumbling with subsequent striking against other object, initial encounter: Secondary | ICD-10-CM | POA: Insufficient documentation

## 2015-01-11 DIAGNOSIS — S6991XA Unspecified injury of right wrist, hand and finger(s), initial encounter: Secondary | ICD-10-CM | POA: Diagnosis present

## 2015-01-11 DIAGNOSIS — Z79899 Other long term (current) drug therapy: Secondary | ICD-10-CM | POA: Insufficient documentation

## 2015-01-11 DIAGNOSIS — Z87442 Personal history of urinary calculi: Secondary | ICD-10-CM | POA: Insufficient documentation

## 2015-01-11 DIAGNOSIS — Y998 Other external cause status: Secondary | ICD-10-CM | POA: Diagnosis not present

## 2015-01-11 DIAGNOSIS — R52 Pain, unspecified: Secondary | ICD-10-CM

## 2015-01-11 DIAGNOSIS — S60031A Contusion of right middle finger without damage to nail, initial encounter: Secondary | ICD-10-CM | POA: Insufficient documentation

## 2015-01-11 DIAGNOSIS — S62639A Displaced fracture of distal phalanx of unspecified finger, initial encounter for closed fracture: Secondary | ICD-10-CM

## 2015-01-11 MED ORDER — HYDROCODONE-ACETAMINOPHEN 5-325 MG PO TABS
2.0000 | ORAL_TABLET | Freq: Once | ORAL | Status: AC
Start: 2015-01-11 — End: 2015-01-11
  Administered 2015-01-11: 2 via ORAL
  Filled 2015-01-11: qty 2

## 2015-01-11 MED ORDER — HYDROCODONE-ACETAMINOPHEN 5-325 MG PO TABS: 1.0000 | ORAL_TABLET | ORAL | Status: DC | PRN

## 2015-01-11 NOTE — ED Notes (Signed)
Declined W/C at D/C and was escorted to lobby by RN. 

## 2015-01-11 NOTE — ED Notes (Signed)
She tripped and fell yesterday, caught her R middle finger on a rail. C/o pain, swelling and rednes to tip of R middle finger since. She tried goody powder with minimal relief

## 2015-01-11 NOTE — Discharge Instructions (Signed)
Take the prescribed medication as directed. Follow-up with Dr. [name] to ensure finger is healing properly. Return to the ED for new or worsening symptoms.

## 2015-01-11 NOTE — ED Provider Notes (Signed)
CSN: [id number]     Arrival date & time 01/11/15  1221 History  By signing my name below, I, Elon Spanner, attest that this documentation has been prepared under the direction and in the presence of Sharilyn Sites, PA-C. Electronically Signed: Elon Spanner ED Scribe. 01/11/2015. 2:13 PM.    Chief Complaint  Patient presents with  . Finger Injury   The history is provided by the patient. No language interpreter was used.    HPI Comments: [name] is a 39 y.o. female who presents to the Emergency Department complaining of constant, sharp, moderate right middle finger pain and swelling onset yesterday, worse with ROM, unrelieved by Cristy Hilts.  The patient reports she tripped forward and, in an attempt to catch herself, hit her outstretched middle finger head-on on a wall. No head injury or LOC.  Patient is right hand dominant.  Past Medical History  Diagnosis Date  . Asthma   . Emphysema   . COPD (chronic obstructive pulmonary disease) (HCC)   . Fibromyalgia, primary   . Anxiety   . Depressed   . Nephrolithiasis   . UTI (lower urinary tract infection)   . Kidney stone   . Hepatitis C   . Dysrhythmia   . Fibromyalgia   . Carpal tunnel syndrome    Past Surgical History  Procedure Laterality Date  . Abdominal hysterectomy    . Orif ankle fracture  02/03/2012    Procedure: OPEN REDUCTION INTERNAL FIXATION (ORIF) ANKLE FRACTURE;  Surgeon: Nadara Mustard, MD;  Location: [facility] OR;  Service: Orthopedics;  Laterality: Right;  Open Reduction Internal Fixation Right Ankle  . Tubal ligation    . Wisdom tooth extraction    . Orif clavicular fracture Left 08/29/2012    Dr Ophelia Charter  . Orif clavicular fracture Left 08/29/2012    Procedure: OPEN REDUCTION INTERNAL FIXATION (ORIF) CLAVICULAR FRACTURE;  Surgeon: Eldred Manges, MD;  Location: [facility] OR;  Service: Orthopedics;  Laterality: Left;  Open Reduction Internal Fixation Left Clavicle Fracture   Family History  Problem Relation Age of Onset  .  Alcohol abuse Father   . Cirrhosis Father   . Cancer Father   . Hyperthyroidism Sister   . Bipolar disorder Sister   . Kidney disease Brother   . Heart attack Mother   . Hypertension Mother    Social History  Substance Use Topics  . Smoking status: Current Every Day Smoker -- 0.40 packs/day for 10 years    Types: Cigarettes  . Smokeless tobacco: Never Used  . Alcohol Use: No     Comment: 3 months ago   OB History    Gravida Para Term Preterm AB TAB SAB Ectopic Multiple Living            4      Obstetric Comments   S/p hysterectomy     Review of Systems A complete 10 system review of systems was obtained and all systems are negative except as noted in the HPI and PMH.   Allergies  Eggs or egg-derived products; Milk-related compounds; and Sulfa antibiotics  Home Medications   Prior to Admission medications   Medication Sig Start Date End Date Taking? Authorizing Provider  albuterol (PROVENTIL HFA;VENTOLIN HFA) 108 (90 BASE) MCG/ACT inhaler Inhale 2 puffs into the lungs every 4 (four) hours as needed for wheezing. 11/17/14   Tyrone Nine, MD  metoprolol succinate (TOPROL-XL) 25 MG 24 hr tablet Take 1 tablet (25 mg total) by mouth daily. 12/10/14  Latrelle Dodrill, MD  omeprazole (PRILOSEC) 20 MG capsule Take 1 capsule (20 mg total) by mouth daily. 12/10/14   Latrelle Dodrill, MD  sucralfate (CARAFATE) 1 GM/10ML suspension Take 10 mLs (1 g total) by mouth 4 (four) times daily -  with meals and at bedtime. 12/10/14   Latrelle Dodrill, MD  venlafaxine XR (EFFEXOR-XR) 75 MG 24 hr capsule Take 225 mg by mouth every morning. Patient states she takes 3 tabs a day per patient 06/20/14   Historical Provider, MD   BP 150/108 mmHg  Pulse 91  Temp(Src) 98.1 F (36.7 C) (Oral)  Resp 16  SpO2 98%   Physical Exam  Constitutional: She is oriented to person, place, and time. She appears well-developed and well-nourished.  HENT:  Head: Normocephalic and atraumatic.  Mouth/Throat:  Oropharynx is clear and moist.  Eyes: Conjunctivae and EOM are normal. Pupils are equal, round, and reactive to light.  Neck: Normal range of motion.  Cardiovascular: Normal rate, regular rhythm and normal heart sounds.   Pulmonary/Chest: Effort normal and breath sounds normal.  Abdominal: Soft. Bowel sounds are normal.  Musculoskeletal: Normal range of motion.  Swelling and bruising noted to distal phalanx of right middle finger; no acute deformity; mild swelling noted; limited ROM of affected finger due to pain; full ROM of wrist and all other fingers; hand is NVI  Neurological: She is alert and oriented to person, place, and time.  Skin: Skin is warm and dry.  Psychiatric: She has a normal mood and affect.  Nursing note and vitals reviewed.   ED Course  ORTHOPEDIC INJURY TREATMENT Date/Time: 01/11/2015 2:42 PM Performed by: Garlon Hatchet Authorized by: Garlon Hatchet Consent: Verbal consent obtained. Risks and benefits: risks, benefits and alternatives were discussed Consent given by: patient Patient understanding: patient states understanding of the procedure being performed Required items: required blood products, implants, devices, and special equipment available Patient identity confirmed: verbally with patient Time out: Immediately prior to procedure a "time out" was called to verify the correct patient, procedure, equipment, support staff and site/side marked as required. Injury location: finger Location details: right long finger Injury type: fracture Fracture type: distal phalanx MCP joint involved: no IP joint involved: no Pre-procedure neurovascular assessment: neurovascularly intact Local anesthesia used: no Patient sedated: no Immobilization: splint Splint type: static finger Supplies used: aluminum splint Post-procedure neurovascular assessment: post-procedure neurovascularly intact Patient tolerance: Patient tolerated the procedure well with no immediate  complications   (including critical care time)  DIAGNOSTIC STUDIES: Oxygen Saturation is 98% on RA, normal by my interpretation.    COORDINATION OF CARE:  2:13 PM Discussed treatment plan with patient at bedside.  Patient acknowledges and agrees with plan.    Labs Review Labs Reviewed - No data to display  Imaging Review Dg Finger Middle Right  01/11/2015  CLINICAL DATA:  Middle finger pain, jammed finger yesterday EXAM: RIGHT MIDDLE FINGER 2+V COMPARISON:  None. FINDINGS: Three views of the right third finger submitted. There is minimal displaced avulsion fracture dorsal aspect at the base of distal phalanx. IMPRESSION: Minimal displaced avulsion fracture dorsal aspect at the base of distal phalanx right third finger. Electronically Signed   By: Natasha Mead M.D.   On: 01/11/2015 14:07   I have personally reviewed and evaluated these images and lab results as part of my medical decision-making.   EKG Interpretation None      MDM   Final diagnoses:  Fracture, finger, distal phalanx, closed, initial encounter  39 year old female with right middle finger injury. Landed on it during a fall yesterday. No head injury or loss of consciousness. Swelling and tenderness noted to distal aspect of right middle finger.  No acute deformity. Hand is neurovascularly intact. X-ray confirms avulsion fracture of distal phalanx. Finger splint was placed. Rx Vicodin. Patient is established with orthopedics, Dr. [name] who she prefers to follow-up with next week, she will have him refer her to hand surgeon in his group if needed.  Discussed plan with patient, he/she acknowledged understanding and agreed with plan of care.  Return precautions given for new or worsening symptoms.  I personally performed the services described in this documentation, which was scribed in my presence. The recorded information has been reviewed and is accurate.  Garlon HatchetLisa M Sanders, PA-C 01/11/15 1442  Geoffery Lyonsouglas Delo, MD 01/11/15  [phone]

## 2015-01-14 ENCOUNTER — Ambulatory Visit: Payer: Medicaid Other | Attending: Orthopaedic Surgery

## 2015-01-14 DIAGNOSIS — M542 Cervicalgia: Secondary | ICD-10-CM

## 2015-01-14 NOTE — Patient Instructions (Signed)
Posture - Standing   Good posture is important. Avoid slouching and forward head thrust. Maintain curve in low back and align ears over shoulders, hips over ankles.  Pull your belly button in toward your back bone. Posture Tips DO: - stand tall and erect - keep chin tucked in - keep head and shoulders in alignment - check posture regularly in mirror or large window - pull head back against headrest in car seat;  Change your position often.  Sit with lumbar support. DON'T: - slouch or slump while watching TV or reading - sit, stand or lie in one position  for too long;  Sitting is especially hard on the spine so if you sit at a desk/use the computer, then stand up often! Copyright  VHI. All rights reserved.  Posture - Sitting  Sit upright, head facing forward. Try using a roll to support lower back. Keep shoulders relaxed, and avoid rounded back. Keep hips level with knees. Avoid crossing legs for long periods. Copyright  VHI. All rights reserved.  Chronic neck strain can develop because of poor posture and faulty work habits  Postural strain related to slumped sitting and forward head posture is a leading cause of headaches, neck and upper back pain  General strengthening and flexibility exercises are helpful in the treatment of neck pain.  Most importantly, you should learn to correct the posture that may be contributing to chronic pain.   Change positions frequently  Change your work or home environment to improve posture and mechanics.   PERFORM ALL EXERCISES GENTLY AND WITH GOOD POSTURE.    20 SECOND HOLD, 3 REPS TO EACH SIDE. 4-5 TIMES EACH DAY.   AROM: Neck Rotation   Turn head slowly to look over one shoulder, then the other.   AROM: Neck Flexion   Bend head forward.   AROM: Lateral Neck Flexion   Slowly tilt head toward one shoulder, then the other.   [facility] [address], [address] [place], [place] [address] Phone # [phone] Fax  [phone] 
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
## 2015-01-29 ENCOUNTER — Ambulatory Visit: Payer: Medicaid Other | Admitting: Gastroenterology

## 2015-02-02 ENCOUNTER — Other Ambulatory Visit: Payer: Self-pay | Admitting: Orthopaedic Surgery

## 2015-02-02 DIAGNOSIS — M545 Low back pain: Secondary | ICD-10-CM

## 2015-02-17 ENCOUNTER — Ambulatory Visit: Payer: Medicaid Other | Admitting: Family Medicine

## 2015-02-17 ENCOUNTER — Encounter (HOSPITAL_COMMUNITY): Payer: Self-pay | Admitting: Emergency Medicine

## 2015-02-17 ENCOUNTER — Emergency Department (HOSPITAL_COMMUNITY)
Admission: EM | Admit: 2015-02-17 | Discharge: 2015-02-17 | Disposition: A | Discharge status: Home / Self Care | Payer: Medicaid Other | Attending: Emergency Medicine | Admitting: Emergency Medicine

## 2015-02-17 ENCOUNTER — Ambulatory Visit
Admission: RE | Admit: 2015-02-17 | Discharge: 2015-02-17 | Disposition: A | Discharge status: Home / Self Care | Payer: Medicaid Other | Source: Ambulatory Visit | Attending: Orthopaedic Surgery | Admitting: Orthopaedic Surgery

## 2015-02-17 ENCOUNTER — Emergency Department (HOSPITAL_COMMUNITY): Payer: Medicaid Other

## 2015-02-17 DIAGNOSIS — Z79899 Other long term (current) drug therapy: Secondary | ICD-10-CM | POA: Insufficient documentation

## 2015-02-17 DIAGNOSIS — M546 Pain in thoracic spine: Secondary | ICD-10-CM

## 2015-02-17 DIAGNOSIS — Z8669 Personal history of other diseases of the nervous system and sense organs: Secondary | ICD-10-CM | POA: Insufficient documentation

## 2015-02-17 DIAGNOSIS — M797 Fibromyalgia: Secondary | ICD-10-CM | POA: Insufficient documentation

## 2015-02-17 DIAGNOSIS — F329 Major depressive disorder, single episode, unspecified: Secondary | ICD-10-CM | POA: Insufficient documentation

## 2015-02-17 DIAGNOSIS — Z87442 Personal history of urinary calculi: Secondary | ICD-10-CM | POA: Insufficient documentation

## 2015-02-17 DIAGNOSIS — F419 Anxiety disorder, unspecified: Secondary | ICD-10-CM | POA: Insufficient documentation

## 2015-02-17 DIAGNOSIS — Z87440 Personal history of urinary (tract) infections: Secondary | ICD-10-CM | POA: Insufficient documentation

## 2015-02-17 DIAGNOSIS — F17210 Nicotine dependence, cigarettes, uncomplicated: Secondary | ICD-10-CM | POA: Diagnosis not present

## 2015-02-17 DIAGNOSIS — J449 Chronic obstructive pulmonary disease, unspecified: Secondary | ICD-10-CM | POA: Diagnosis not present

## 2015-02-17 DIAGNOSIS — Z8619 Personal history of other infectious and parasitic diseases: Secondary | ICD-10-CM | POA: Diagnosis not present

## 2015-02-17 DIAGNOSIS — M545 Low back pain: Secondary | ICD-10-CM

## 2015-02-17 LAB — URINALYSIS, ROUTINE W REFLEX MICROSCOPIC
BILIRUBIN URINE: NEGATIVE
Glucose, UA: NEGATIVE mg/dL
Hgb urine dipstick: NEGATIVE
KETONES UR: NEGATIVE mg/dL
NITRITE: NEGATIVE
Protein, ur: NEGATIVE mg/dL
Specific Gravity, Urine: 1.018 (ref 1.005–1.030)
pH: 6 (ref 5.0–8.0)

## 2015-02-17 LAB — URINE MICROSCOPIC-ADD ON

## 2015-02-17 MED ORDER — KETOROLAC TROMETHAMINE 60 MG/2ML IM SOLN
60.0000 mg | Freq: Once | INTRAMUSCULAR | Status: AC
  Administered 2015-02-17: 60 mg via INTRAMUSCULAR
  Filled 2015-02-17: qty 2

## 2015-02-17 MED ORDER — DIAZEPAM 5 MG PO TABS
5.0000 mg | ORAL_TABLET | Freq: Once | ORAL | Status: AC
  Administered 2015-02-17: 5 mg via ORAL
  Filled 2015-02-17: qty 1

## 2015-02-17 NOTE — ED Provider Notes (Signed)
CSN: [id number]     Arrival date & time 02/17/15  1142 History   First MD Initiated Contact with Patient 02/17/15 1208     Chief Complaint  Patient presents with  . Back Pain     (Consider location/radiation/quality/duration/timing/severity/associated sxs/prior Treatment) Patient is a 39 y.o. female presenting with back pain.  Back Pain Location:  Thoracic spine Quality:  Shooting and stabbing Radiates to:  Does not radiate Pain severity:  Severe Pain is:  Same all the time Onset quality:  Sudden Duration:  8 hours Timing:  Constant Progression:  Unchanged Chronicity:  New Relieved by:  Nothing Worsened by:  Bending, palpation, movement and twisting Ineffective treatments:  None tried Associated symptoms: no abdominal pain, no bladder incontinence, no bowel incontinence, no chest pain, no dysuria, no fever, no headaches, no leg pain, no paresthesias, no pelvic pain and no perianal numbness     39 year old female with sudden onset right-sided flank pain. Pain is constant sharp and shooting. Patient denies any injury. Worse with movement palpation deep breaths. Patient denies dysuria, fevers, chills. Patient had an MRI done today for degenerative disc disease for her neck. Patient has a history of fibromyalgia and is disabled at the age of 39.  Past Medical History  Diagnosis Date  . Asthma   . Emphysema   . COPD (chronic obstructive pulmonary disease) (HCC)   . Fibromyalgia, primary   . Anxiety   . Depressed   . Nephrolithiasis   . UTI (lower urinary tract infection)   . Kidney stone   . Hepatitis C   . Dysrhythmia   . Fibromyalgia   . Carpal tunnel syndrome    Past Surgical History  Procedure Laterality Date  . Abdominal hysterectomy    . Orif ankle fracture  02/03/2012    Procedure: OPEN REDUCTION INTERNAL FIXATION (ORIF) ANKLE FRACTURE;  Surgeon: Nadara MustardMarcus V Duda, MD;  Location: [facility] OR;  Service: Orthopedics;  Laterality: Right;  Open Reduction Internal Fixation Right  Ankle  . Tubal ligation    . Wisdom tooth extraction    . Orif clavicular fracture Left 08/29/2012    Dr [name]  . Orif clavicular fracture Left 08/29/2012    Procedure: OPEN REDUCTION INTERNAL FIXATION (ORIF) CLAVICULAR FRACTURE;  Surgeon: Eldred MangesMark C Yates, MD;  Location: [facility] OR;  Service: Orthopedics;  Laterality: Left;  Open Reduction Internal Fixation Left Clavicle Fracture   Family History  Problem Relation Age of Onset  . Alcohol abuse Father   . Cirrhosis Father   . Cancer Father   . Hyperthyroidism Sister   . Bipolar disorder Sister   . Kidney disease Brother   . Heart attack Mother   . Hypertension Mother    Social History  Substance Use Topics  . Smoking status: Current Every Day Smoker -- 0.40 packs/day for 10 years    Types: Cigarettes  . Smokeless tobacco: Never Used  . Alcohol Use: No     Comment: 3 months ago   OB History    Gravida Para Term Preterm AB TAB SAB Ectopic Multiple Living            4      Obstetric Comments   S/p hysterectomy     Review of Systems  Constitutional: Negative for fever and chills.  HENT: Negative for congestion and rhinorrhea.   Eyes: Negative for redness and visual disturbance.  Respiratory: Negative for shortness of breath and wheezing.   Cardiovascular: Negative for chest pain and palpitations.  Gastrointestinal: Positive for  nausea. Negative for vomiting, abdominal pain and bowel incontinence.  Genitourinary: Negative for bladder incontinence, dysuria, urgency and pelvic pain.  Musculoskeletal: Positive for back pain. Negative for myalgias and arthralgias.  Skin: Negative for pallor and wound.  Neurological: Negative for dizziness, headaches and paresthesias.      Allergies  Eggs or egg-derived products; Milk-related compounds; and Sulfa antibiotics  Home Medications   Prior to Admission medications   Medication Sig Start Date End Date Taking? Authorizing Provider  albuterol (PROVENTIL HFA;VENTOLIN HFA) 108 (90 BASE)  MCG/ACT inhaler Inhale 2 puffs into the lungs every 4 (four) hours as needed for wheezing. 11/17/14  Yes Tyrone Nine, MD  metoprolol succinate (TOPROL-XL) 25 MG 24 hr tablet Take 1 tablet (25 mg total) by mouth daily. 12/10/14  Yes Latrelle Dodrill, MD  omeprazole (PRILOSEC) 20 MG capsule Take 1 capsule (20 mg total) by mouth daily. 12/10/14  Yes Latrelle Dodrill, MD  sucralfate (CARAFATE) 1 GM/10ML suspension Take 10 mLs (1 g total) by mouth 4 (four) times daily -  with meals and at bedtime. 12/10/14  Yes Latrelle Dodrill, MD  venlafaxine XR (EFFEXOR-XR) 75 MG 24 hr capsule Take 225 mg by mouth every morning. Patient states she takes 3 tabs a day per patient 06/20/14  Yes Historical Provider, MD  HYDROcodone-acetaminophen (NORCO/VICODIN) 5-325 MG tablet Take 1 tablet by mouth every 4 (four) hours as needed. Patient not taking: Reported on 01/14/2015 01/11/15   Garlon Hatchet, PA-C  mirtazapine (REMERON) 15 MG tablet Take 15 mg by mouth at bedtime. 11/10/14   Historical Provider, MD  predniSONE (DELTASONE) 10 MG tablet TAKE 4 TABS FOR 1ST DAY THEN 3 TABS X 3DAYS THEN 2 TAKE X 3 DAYS THEN 1 TAB X 6 DAYS 01/13/15   Historical Provider, MD  traZODone (DESYREL) 50 MG tablet TAKE 1 TABLET AT BEDTIME AS NEEDED FOR INSOMNIA. 01/05/15   Historical Provider, MD   BP 143/100 mmHg  Pulse 81  Temp(Src) 97.9 F (36.6 C) (Oral)  Resp 12  SpO2 100% Physical Exam  Constitutional: She is oriented to person, place, and time. She appears well-developed and well-nourished. No distress.  HENT:  Head: Normocephalic and atraumatic.  Eyes: EOM are normal. Pupils are equal, round, and reactive to light.  Neck: Normal range of motion. Neck supple.  Cardiovascular: Normal rate and regular rhythm.  Exam reveals no gallop and no friction rub.   No murmur heard. Pulmonary/Chest: Effort normal. She has no wheezes. She has no rales.  Abdominal: Soft. She exhibits no distension. There is no tenderness. There is no  rebound and no guarding.  Musculoskeletal: She exhibits tenderness (TTP about the right side in a linear distribution about rib 5.  Above CVA.  no midline TTP). She exhibits no edema.  Neurological: She is alert and oriented to person, place, and time.  Skin: Skin is warm and dry. She is not diaphoretic.  Psychiatric: She has a normal mood and affect. Her behavior is normal.  Nursing note and vitals reviewed.   ED Course  Procedures (including critical care time) Labs Review Labs Reviewed  URINALYSIS, ROUTINE W REFLEX MICROSCOPIC (NOT AT [facility]) - Abnormal; Notable for the following:    APPearance CLOUDY (*)    Leukocytes, UA SMALL (*)    All other components within normal limits  URINE MICROSCOPIC-ADD ON - Abnormal; Notable for the following:    Squamous Epithelial / LPF 6-30 (*)    Bacteria, UA RARE (*)    All  other components within normal limits    Imaging Review Dg Thoracic Spine 2 View  02/17/2015  CLINICAL DATA:  Right-sided back pain starting last night. Difficult to breathe EXAM: THORACIC SPINE 2 VIEWS COMPARISON:  Chest x-ray 11/18/2014 FINDINGS: A posterior right tenth rib fracture appears chronic given associated callus, newly developed since comparison however. There is a lucency through the posterior right seventh rib which could reflect recent nondisplaced fracture. No evidence of thoracic spine fracture, subluxation, or endplate erosion. Focal T11-12 degenerative disc narrowing and endplate spurring. IMPRESSION: 1. No acute thoracic finding. 2. Age indeterminate right seventh rib fracture. Electronically Signed   By: Marnee Spring M.D.   On: 02/17/2015 12:59   Mr Cervical Spine Wo Contrast  02/17/2015  CLINICAL DATA:  Neck pain with bilateral arm numbness for 2 years. No acute injury or prior relevant surgery. Initial encounter. EXAM: MRI CERVICAL SPINE WITHOUT CONTRAST TECHNIQUE: Multiplanar, multisequence MR imaging of the cervical spine was performed. No intravenous  contrast was administered. COMPARISON:  Cervical spine CT 12/17/2012 and 08/05/2014. FINDINGS: Alignment: Stable with mild reversal of normal cervical lordosis. There is no focal angulation or significant listhesis. Bones: A hemangioma within the left aspect of the T1 vertebral body appears unchanged. No acute or suspicious osseous findings. Cord: Normal in signal and caliber. Posterior Fossa: There is some mucosal thickening in the sphenoid sinus. The visualized posterior fossa otherwise appears unremarkable. Vertebral Arteries: Bilateral vertebral artery flow voids. Paraspinal tissues: Probable incidental perineural cysts in the lower cervical spine, largest on the right at C6-7. Disc levels: C2-3:  Normal interspace. C3-4: Mild uncinate spurring and bilateral facet hypertrophy, worse on the left. Mild left foraminal narrowing. No cord deformity. C4-5: Mildly progressive spondylosis with posterior osteophytes covering diffusely bulging disc material. Uncinate spurring and bilateral facet hypertrophy contribute to mild foraminal narrowing bilaterally. There is suspicion of a superimposed left-sided foraminal disc herniation, best seen on the sagittal images. This would be expected to cause left C5 nerve root encroachment. C5-6: Chronic spondylosis with loss of disc height, posterior osteophytes and bilateral uncinate spurring. The CSF surrounding the cord is partially effaced, but there is no cord deformity. Mild to moderate osseous foraminal narrowing bilaterally appears unchanged per C6-7: The disc appears normal. As above, probable incidental perineural cyst on the right, grossly stable. No nerve root encroachment. C7-T1:  Normal interspace. IMPRESSION: 1. Stable spondylosis at C5-6 with posterior osteophytes and uncinate spurring contributing to chronic mild to moderate foraminal narrowing bilaterally, unchanged from previous CT. 2. Spondylosis at C4-5 appears mildly progressive with a possible superimposed  left foraminal disc herniation contributing to left C5 nerve root encroachment. Correlate with specific radicular symptoms. 3. No other suspicious findings. Asymmetric facet hypertrophy on the left at C3-4. Electronically Signed   By: Carey Bullocks M.D.   On: 02/17/2015 12:11   I have personally reviewed and evaluated these images and lab results as part of my medical decision-making.   EKG Interpretation None      MDM   Final diagnoses:  Right-sided thoracic back pain    39 yo F with a chief complaint of right side pain. Pain is in a distribution that could be consistent with shingles. X-ray negative for any acute findings. Patient has known old rib fractures denies other recent injury. UA negative for infection or blood. Feel unlikely to be nephrolithiasis or pyelo. Patient feeling better after Valium and Toradol. Discharge home.  4:01 PM:  I have discussed the diagnosis/risks/treatment options with the patient and  believe the pt to be eligible for discharge home to follow-up with PCP. We also discussed returning to the ED immediately if new or worsening sx occur. We discussed the sx which are most concerning (e.g., sudden worsening pain, fever, inability to tolerate by mouth) that necessitate immediate return. Medications administered to the patient during their visit and any new prescriptions provided to the patient are listed below.  Medications given during this visit Medications  ketorolac (TORADOL) injection 60 mg (60 mg Intramuscular Given 02/17/15 1233)  diazepam (VALIUM) tablet 5 mg (5 mg Oral Given 02/17/15 1232)    Discharge Medication List as of 02/17/2015  1:36 PM      The patient appears reasonably screen and/or stabilized for discharge and I doubt any other medical condition or other [facility] requiring further screening, evaluation, or treatment in the ED at this time prior to discharge.      Melene Plan, DO 02/17/15 1601

## 2015-02-17 NOTE — ED Notes (Signed)
Pt reports having a sudden onset of mid back that woke her up from sleep at 0600 this am. Pt already had a scheduled MRI today for DDD in neck. Pt just came from MRI from [facility]. Pt also reports feeling nauseous and feeling sob when the pain comes. Denies any ABD pain.

## 2015-02-17 NOTE — Discharge Instructions (Signed)
Take 4 over the counter ibuprofen tablets 3 times a day or 2 over-the-counter naproxen tablets twice a day for pain.  This could be an early presentation for shingles. If you read out a rash on your side follow-up with their family doctor to get steroids and antiviral medications. Back Pain, Adult Back pain is very common in adults.The cause of back pain is rarely dangerous and the pain often gets better over time.The cause of your back pain may not be known. Some common causes of back pain include:  Strain of the muscles or ligaments supporting the spine.  Wear and tear (degeneration) of the spinal disks.  Arthritis.  Direct injury to the back. For many people, back pain may return. Since back pain is rarely dangerous, most people can learn to manage this condition on their own. HOME CARE INSTRUCTIONS Watch your back pain for any changes. The following actions may help to lessen any discomfort you are feeling:  Remain active. It is stressful on your back to sit or stand in one place for long periods of time. Do not sit, drive, or stand in one place for more than 30 minutes at a time. Take short walks on even surfaces as soon as you are able.Try to increase the length of time you walk each day.  Exercise regularly as directed by your health care provider. Exercise helps your back heal faster. It also helps avoid future injury by keeping your muscles strong and flexible.  Do not stay in bed.Resting more than 1-2 days can delay your recovery.  Pay attention to your body when you bend and lift. The most comfortable positions are those that put less stress on your recovering back. Always use proper lifting techniques, including:  Bending your knees.  Keeping the load close to your body.  Avoiding twisting.  Find a comfortable position to sleep. Use a firm mattress and lie on your side with your knees slightly bent. If you lie on your back, put a pillow under your knees.  Avoid  feeling anxious or stressed.Stress increases muscle tension and can worsen back pain.It is important to recognize when you are anxious or stressed and learn ways to manage it, such as with exercise.  Take medicines only as directed by your health care provider. Over-the-counter medicines to reduce pain and inflammation are often the most helpful.Your health care provider may prescribe muscle relaxant drugs.These medicines help dull your pain so you can more quickly return to your normal activities and healthy exercise.  Apply ice to the injured area:  Put ice in a plastic bag.  Place a towel between your skin and the bag.  Leave the ice on for 20 minutes, 2-3 times a day for the first 2-3 days. After that, ice and heat may be alternated to reduce pain and spasms.  Maintain a healthy weight. Excess weight puts extra stress on your back and makes it difficult to maintain good posture. SEEK MEDICAL CARE IF:  You have pain that is not relieved with rest or medicine.  You have increasing pain going down into the legs or buttocks.  You have pain that does not improve in one week.  You have night pain.  You lose weight.  You have a fever or chills. SEEK IMMEDIATE MEDICAL CARE IF:   You develop new bowel or bladder control problems.  You have unusual weakness or numbness in your arms or legs.  You develop nausea or vomiting.  You develop abdominal pain.  You  feel faint.   This information is not intended to replace advice given to you by your health care provider. Make sure you discuss any questions you have with your health care provider.   Document Released: 03/14/2005 Document Revised: 04/04/2014 Document Reviewed: 07/16/2013 Elsevier Interactive Patient Education [employer].

## 2015-03-09 ENCOUNTER — Ambulatory Visit: Payer: Medicaid Other | Admitting: Internal Medicine

## 2015-03-13 ENCOUNTER — Ambulatory Visit: Payer: Medicaid Other | Admitting: Internal Medicine

## 2015-03-22 ENCOUNTER — Emergency Department (HOSPITAL_COMMUNITY)
Admission: EM | Admit: 2015-03-22 | Discharge: 2015-03-22 | Disposition: A | Discharge status: Home / Self Care | Payer: Medicaid Other | Attending: Emergency Medicine | Admitting: Emergency Medicine

## 2015-03-22 ENCOUNTER — Encounter (HOSPITAL_COMMUNITY): Payer: Self-pay | Admitting: Nurse Practitioner

## 2015-03-22 ENCOUNTER — Emergency Department (HOSPITAL_COMMUNITY): Payer: Medicaid Other

## 2015-03-22 DIAGNOSIS — Y9289 Other specified places as the place of occurrence of the external cause: Secondary | ICD-10-CM | POA: Diagnosis not present

## 2015-03-22 DIAGNOSIS — S60222A Contusion of left hand, initial encounter: Secondary | ICD-10-CM | POA: Insufficient documentation

## 2015-03-22 DIAGNOSIS — F329 Major depressive disorder, single episode, unspecified: Secondary | ICD-10-CM | POA: Insufficient documentation

## 2015-03-22 DIAGNOSIS — Z79899 Other long term (current) drug therapy: Secondary | ICD-10-CM | POA: Diagnosis not present

## 2015-03-22 DIAGNOSIS — M797 Fibromyalgia: Secondary | ICD-10-CM | POA: Diagnosis not present

## 2015-03-22 DIAGNOSIS — I499 Cardiac arrhythmia, unspecified: Secondary | ICD-10-CM | POA: Diagnosis not present

## 2015-03-22 DIAGNOSIS — Z87442 Personal history of urinary calculi: Secondary | ICD-10-CM | POA: Diagnosis not present

## 2015-03-22 DIAGNOSIS — Z8669 Personal history of other diseases of the nervous system and sense organs: Secondary | ICD-10-CM | POA: Diagnosis not present

## 2015-03-22 DIAGNOSIS — J449 Chronic obstructive pulmonary disease, unspecified: Secondary | ICD-10-CM | POA: Diagnosis not present

## 2015-03-22 DIAGNOSIS — F17210 Nicotine dependence, cigarettes, uncomplicated: Secondary | ICD-10-CM | POA: Insufficient documentation

## 2015-03-22 DIAGNOSIS — Z87440 Personal history of urinary (tract) infections: Secondary | ICD-10-CM | POA: Diagnosis not present

## 2015-03-22 DIAGNOSIS — Y998 Other external cause status: Secondary | ICD-10-CM | POA: Insufficient documentation

## 2015-03-22 DIAGNOSIS — W228XXA Striking against or struck by other objects, initial encounter: Secondary | ICD-10-CM | POA: Diagnosis not present

## 2015-03-22 DIAGNOSIS — Z8619 Personal history of other infectious and parasitic diseases: Secondary | ICD-10-CM | POA: Insufficient documentation

## 2015-03-22 DIAGNOSIS — S6992XA Unspecified injury of left wrist, hand and finger(s), initial encounter: Secondary | ICD-10-CM | POA: Diagnosis present

## 2015-03-22 DIAGNOSIS — F419 Anxiety disorder, unspecified: Secondary | ICD-10-CM | POA: Diagnosis not present

## 2015-03-22 DIAGNOSIS — Y9389 Activity, other specified: Secondary | ICD-10-CM | POA: Diagnosis not present

## 2015-03-22 MED ORDER — NAPROXEN 500 MG PO TABS: 500.0000 mg | ORAL_TABLET | Freq: Two times a day (BID) | ORAL | Status: DC

## 2015-03-22 MED ORDER — HYDROCODONE-ACETAMINOPHEN 5-325 MG PO TABS: 1.0000 | ORAL_TABLET | Freq: Four times a day (QID) | ORAL | Status: DC | PRN

## 2015-03-22 NOTE — ED Notes (Signed)
She hit her L hand on doorknob Monday, shes had bruising and pain since and today she noticed a painful knot on her hand. Having difficulty extending her L 2nd finger.

## 2015-03-22 NOTE — ED Notes (Signed)
edpa aware of discharge BP, pt was started on metoprolol by PCP for HTN but never followed up for dose adjustment, plans to f/u this week

## 2015-03-22 NOTE — Discharge Instructions (Signed)
Naproxen for pain and inflammation. Norco for severe pain only. Keep hand elevated, ice several times a day. If continued to have inability to raise your finger or spread to her fingers follow up with Dr. Mina MarbleWeingold as referred.   Hand Contusion A hand contusion is a deep bruise on your hand area. Contusions are the result of an injury that caused bleeding under the skin. The contusion may turn blue, purple, or yellow. Minor injuries will give you a painless contusion, but more severe contusions may stay painful and swollen for a few weeks. CAUSES  A contusion is usually caused by a blow, trauma, or direct force to an area of the body. SYMPTOMS   Swelling and redness of the injured area.  Discoloration of the injured area.  Tenderness and soreness of the injured area.  Pain. DIAGNOSIS  The diagnosis can be made by taking a history and performing a physical exam. An X-ray, CT scan, or MRI may be needed to determine if there were any associated injuries, such as broken bones (fractures). TREATMENT  Often, the best treatment for a hand contusion is resting, elevating, icing, and applying cold compresses to the injured area. Over-the-counter medicines may also be recommended for pain control. HOME CARE INSTRUCTIONS   Put ice on the injured area.  Put ice in a plastic bag.  Place a towel between your skin and the bag.  Leave the ice on for 15-20 minutes, 03-04 times a day.  Only take over-the-counter or prescription medicines as directed by your caregiver. Your caregiver may recommend avoiding anti-inflammatory medicines (aspirin, ibuprofen, and naproxen) for 48 hours because these medicines may increase bruising.  If told, use an elastic wrap as directed. This can help reduce swelling. You may remove the wrap for sleeping, showering, and bathing. If your fingers become numb, cold, or blue, take the wrap off and reapply it more loosely.  Elevate your hand with pillows to reduce  swelling.  Avoid overusing your hand if it is painful. SEEK IMMEDIATE MEDICAL CARE IF:   You have increased redness, swelling, or pain in your hand.  Your swelling or pain is not relieved with medicines.  You have loss of feeling in your hand or are unable to move your fingers.  Your hand turns cold or blue.  You have pain when you move your fingers.  Your hand becomes warm to the touch.  Your contusion does not improve in 2 days. MAKE SURE YOU:   Understand these instructions.  Will watch your condition.  Will get help right away if you are not doing well or get worse.   This information is not intended to replace advice given to you by your health care provider. Make sure you discuss any questions you have with your health care provider.   Document Released: 09/03/2001 Document Revised: 12/07/2011 Document Reviewed: 09/05/2011 Elsevier Interactive Patient Education [employer]

## 2015-03-22 NOTE — ED Provider Notes (Signed)
CSN: [id number]     Arrival date & time 03/22/15  1914 History  By signing my name below, I, Budd Palmer, attest that this documentation has been prepared under the direction and in the presence of [employer], PA-C. Electronically Signed: Budd Palmer, ED Scribe. 03/22/2015. 8:16 PM.     Chief Complaint  Patient presents with  . Hand Injury   The history is provided by the patient. No language interpreter was used.   HPI Comments: [name] is a 39 y.o. female smoker at 0.4 ppd with a PMHx of carpal tunnel syndrome and fibromyalgia who presents to the Emergency Department complaining of an injury to the left hand sustained 6 days ago. Pt states she tripped and hit her hand on a metal door knob. She states that since the initial swelling has decreased, she has had increasing pain and is unable to extend the left second digit. She reports associated bruising to the hand. She notes she has a PMHx of fracture to the tip of the left index finger, but notes that this pain feels different and less severe. She notes she is scheduled to see Dr Ophelia Charter at [facility] for a neck surgery to remove a cyst.  Past Medical History  Diagnosis Date  . Asthma   . Emphysema   . COPD (chronic obstructive pulmonary disease) (HCC)   . Fibromyalgia, primary   . Anxiety   . Depressed   . Nephrolithiasis   . UTI (lower urinary tract infection)   . Kidney stone   . Hepatitis C   . Dysrhythmia   . Fibromyalgia   . Carpal tunnel syndrome    Past Surgical History  Procedure Laterality Date  . Abdominal hysterectomy    . Orif ankle fracture  02/03/2012    Procedure: OPEN REDUCTION INTERNAL FIXATION (ORIF) ANKLE FRACTURE;  Surgeon: Nadara Mustard, MD;  Location: [facility] OR;  Service: Orthopedics;  Laterality: Right;  Open Reduction Internal Fixation Right Ankle  . Tubal ligation    . Wisdom tooth extraction    . Orif clavicular fracture Left 08/29/2012    Dr Ophelia Charter  . Orif clavicular  fracture Left 08/29/2012    Procedure: OPEN REDUCTION INTERNAL FIXATION (ORIF) CLAVICULAR FRACTURE;  Surgeon: Eldred Manges, MD;  Location: [facility] OR;  Service: Orthopedics;  Laterality: Left;  Open Reduction Internal Fixation Left Clavicle Fracture   Family History  Problem Relation Age of Onset  . Alcohol abuse Father   . Cirrhosis Father   . Cancer Father   . Hyperthyroidism Sister   . Bipolar disorder Sister   . Kidney disease Brother   . Heart attack Mother   . Hypertension Mother    Social History  Substance Use Topics  . Smoking status: Current Every Day Smoker -- 0.40 packs/day for 10 years    Types: Cigarettes  . Smokeless tobacco: Never Used  . Alcohol Use: No     Comment: 3 months ago   OB History    Gravida Para Term Preterm AB TAB SAB Ectopic Multiple Living            4      Obstetric Comments   S/p hysterectomy     Review of Systems  Musculoskeletal: Positive for myalgias and arthralgias.  Skin: Positive for color change.    Allergies  Eggs or egg-derived products; Milk-related compounds; and Sulfa antibiotics  Home Medications   Prior to Admission medications   Medication Sig Start Date End Date Taking? Authorizing  Provider  albuterol (PROVENTIL HFA;VENTOLIN HFA) 108 (90 BASE) MCG/ACT inhaler Inhale 2 puffs into the lungs every 4 (four) hours as needed for wheezing. 11/17/14   Tyrone Nine, MD  HYDROcodone-acetaminophen (NORCO/VICODIN) 5-325 MG tablet Take 1 tablet by mouth every 4 (four) hours as needed. Patient not taking: Reported on 01/14/2015 01/11/15   Garlon Hatchet, PA-C  metoprolol succinate (TOPROL-XL) 25 MG 24 hr tablet Take 1 tablet (25 mg total) by mouth daily. 12/10/14   Latrelle Dodrill, MD  mirtazapine (REMERON) 15 MG tablet Take 15 mg by mouth at bedtime. 11/10/14   Historical Provider, MD  omeprazole (PRILOSEC) 20 MG capsule Take 1 capsule (20 mg total) by mouth daily. 12/10/14   Latrelle Dodrill, MD  predniSONE (DELTASONE) 10 MG tablet  TAKE 4 TABS FOR 1ST DAY THEN 3 TABS X 3DAYS THEN 2 TAKE X 3 DAYS THEN 1 TAB X 6 DAYS 01/13/15   Historical Provider, MD  sucralfate (CARAFATE) 1 GM/10ML suspension Take 10 mLs (1 g total) by mouth 4 (four) times daily -  with meals and at bedtime. 12/10/14   Latrelle Dodrill, MD  traZODone (DESYREL) 50 MG tablet TAKE 1 TABLET AT BEDTIME AS NEEDED FOR INSOMNIA. 01/05/15   Historical Provider, MD  venlafaxine XR (EFFEXOR-XR) 75 MG 24 hr capsule Take 225 mg by mouth every morning. Patient states she takes 3 tabs a day per patient 06/20/14   Historical Provider, MD   BP 163/114 mmHg  Pulse 84  Temp(Src) 98.1 F (36.7 C) (Oral)  Resp 16  Ht  (1.676 m)  Wt 160 lb (72.576 kg)  BMI 25.84 kg/m2  SpO2 99% Physical Exam  Constitutional: She is oriented to person, place, and time. She appears well-developed and well-nourished.  HENT:  Head: Normocephalic and atraumatic.  Eyes: Conjunctivae are normal. Right eye exhibits no discharge. Left eye exhibits no discharge.  Pulmonary/Chest: Effort normal. No respiratory distress.  Musculoskeletal:  Swelling noted to the dorsal left hand with bruising. Tender to palpation over the second, third, fourth MCP joints. Full range of motion of all fingers. Pain with range of motion of the index and middle finger at MCP joint. Mainly pain with extension at those joints. Cap refill less than 2 seconds distally. Sensation is intact in all fingers.  Neurological: She is alert and oriented to person, place, and time. Coordination normal.  Skin: Skin is warm and dry. No rash noted. She is not diaphoretic. No erythema.  Psychiatric: She has a normal mood and affect.  Nursing note and vitals reviewed.   ED Course  Procedures  DIAGNOSTIC STUDIES: Oxygen Saturation is 99% on RA, normal by my interpretation.    COORDINATION OF CARE: 8:15 PM - Discussed negative XR results. Discussed plans to order an ace wrap and pain medication. Will refer to Dr Mina Marble. Advised  to apply ice packs and elevate the hand. Pt advised of plan for treatment and pt agrees.  Labs Review Labs Reviewed - No data to display  Imaging Review Dg Hand Complete Left  03/22/2015  CLINICAL DATA:  Injury 03/16/2015. Pain and swelling second MCP joint EXAM: LEFT HAND - COMPLETE 3+ VIEW COMPARISON:  02/12/2012 FINDINGS: Normal alignment no fracture.  No significant arthropathy Sclerotic lesion in the radial styloid is unchanged from the prior study and most consistent with bone island. IMPRESSION: Negative. Electronically Signed   By: Marlan Palau M.D.   On: 03/22/2015 20:06   I have personally reviewed and evaluated these images  and lab results as part of my medical decision-making.   EKG Interpretation None      MDM   Final diagnoses:  Hand contusion, left, initial encounter   Patient with left hand pain and swelling after hitting it on a doorknob. Hand still has some swelling to it and diffuse tenderness. Pain with range of motion of the fingers. Patient states she's having trouble extending her index and middle fingers. She is able to do it on my exam but states he feels weak and states "another something wrong with my hand." X-rays negative. I will refer her to a hand specialist. I do still ice, elevation, naproxen for pain, patient is requesting stronger medications. Also mentioned that her neck hurts and she is scheduled to have a surgery by Dr. [name] in several weeks. Will prescribe 20 Vicodin.  Filed Vitals:   03/22/15 1921 03/22/15 2024  BP: 163/114 147/110  Pulse: 84 81  Temp: 98.1 F (36.7 C)   TempSrc: Oral   Resp: 16 18  Height: 5\' 6"  (1.676 m)   Weight: 72.576 kg   SpO2: 99% 98%   I personally performed the services described in this documentation, which was scribed in my presence. The recorded information has been reviewed and is accurate.    Jaynie Crumbleatyana Zitlali Primm, PA-C 03/22/15 2357  Richardean Canalavid H Yao, MD 03/24/15 1000

## 2015-04-14 ENCOUNTER — Other Ambulatory Visit (HOSPITAL_COMMUNITY): Payer: Self-pay | Admitting: Orthopaedic Surgery

## 2015-04-17 IMAGING — CR DG SHOULDER 2+V*L*
2 series · 2 of 2 positions shown · non-contrast
Comparison: 03/05/2005

CLINICAL DATA: Trauma 2 days ago.  Laceration about clavicle.

LEFT SHOULDER - 2+ VIEW

[w shoulder ap external left]
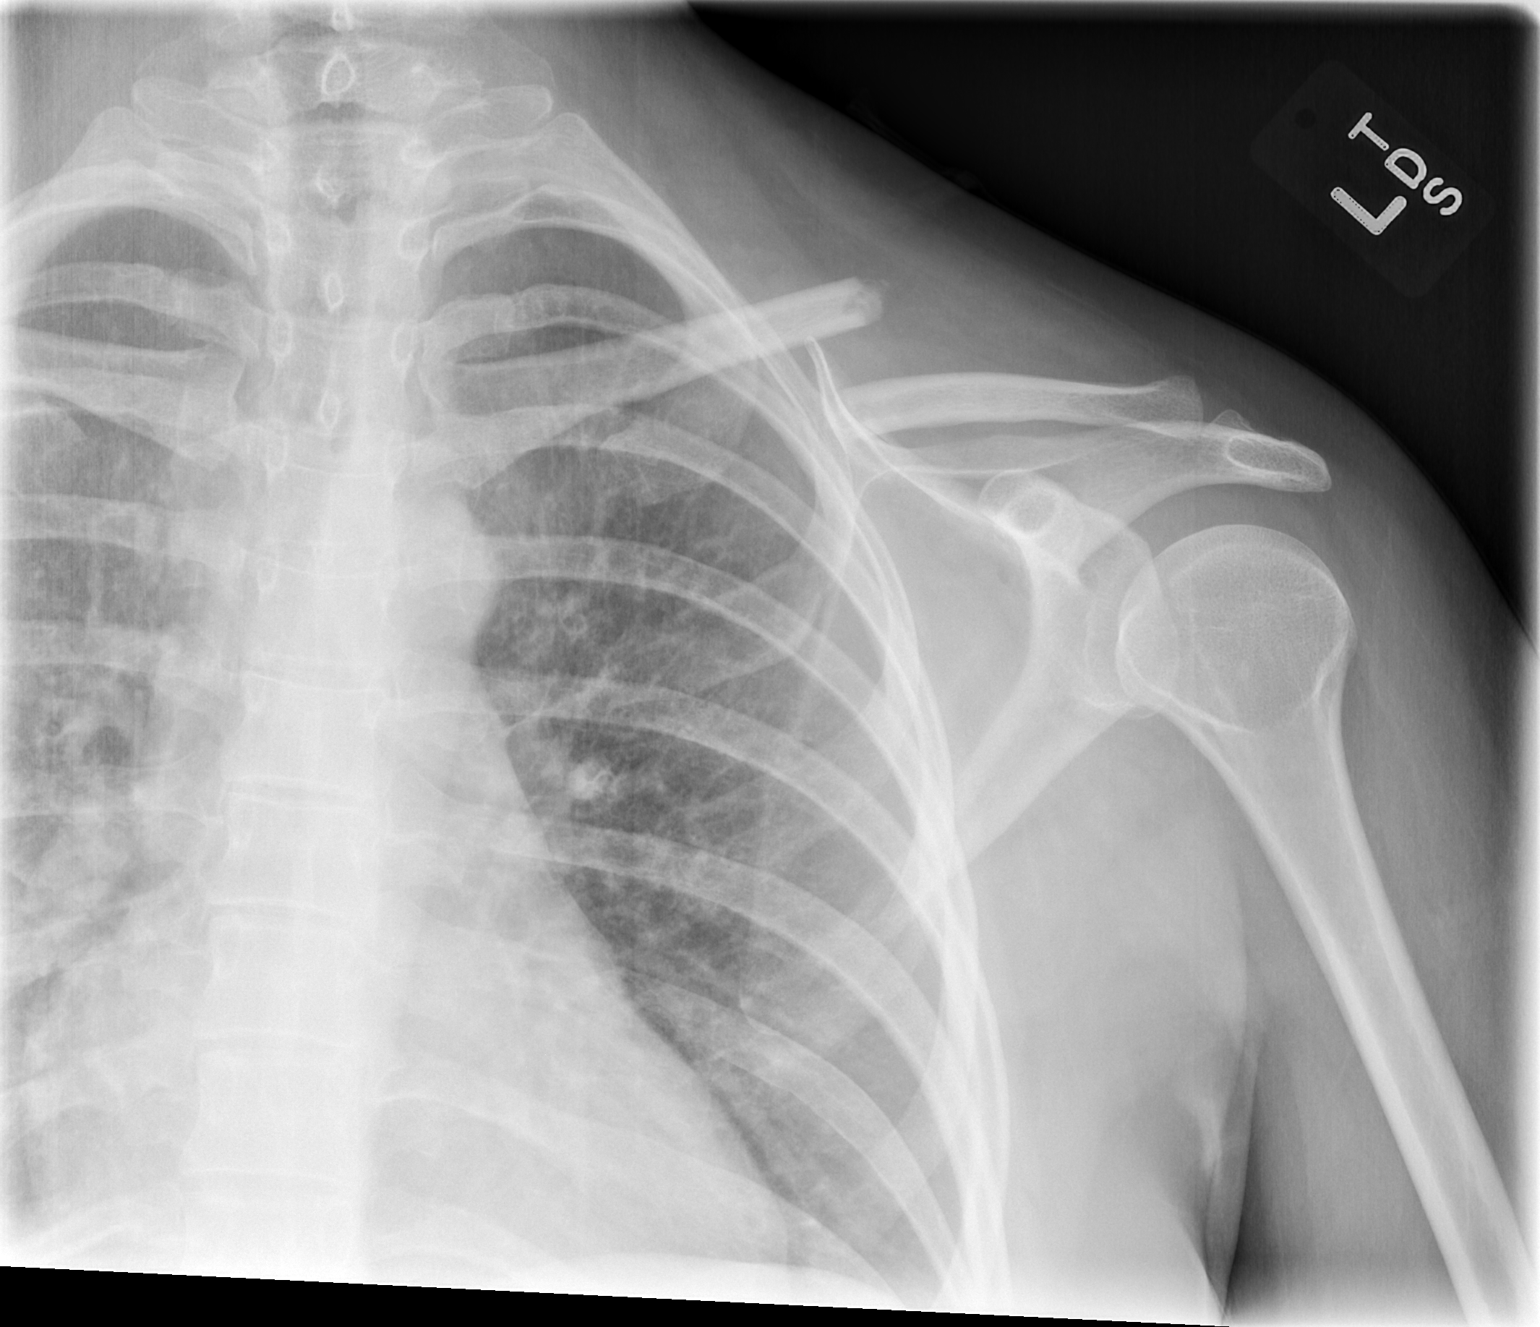

[w shoulder y view left]
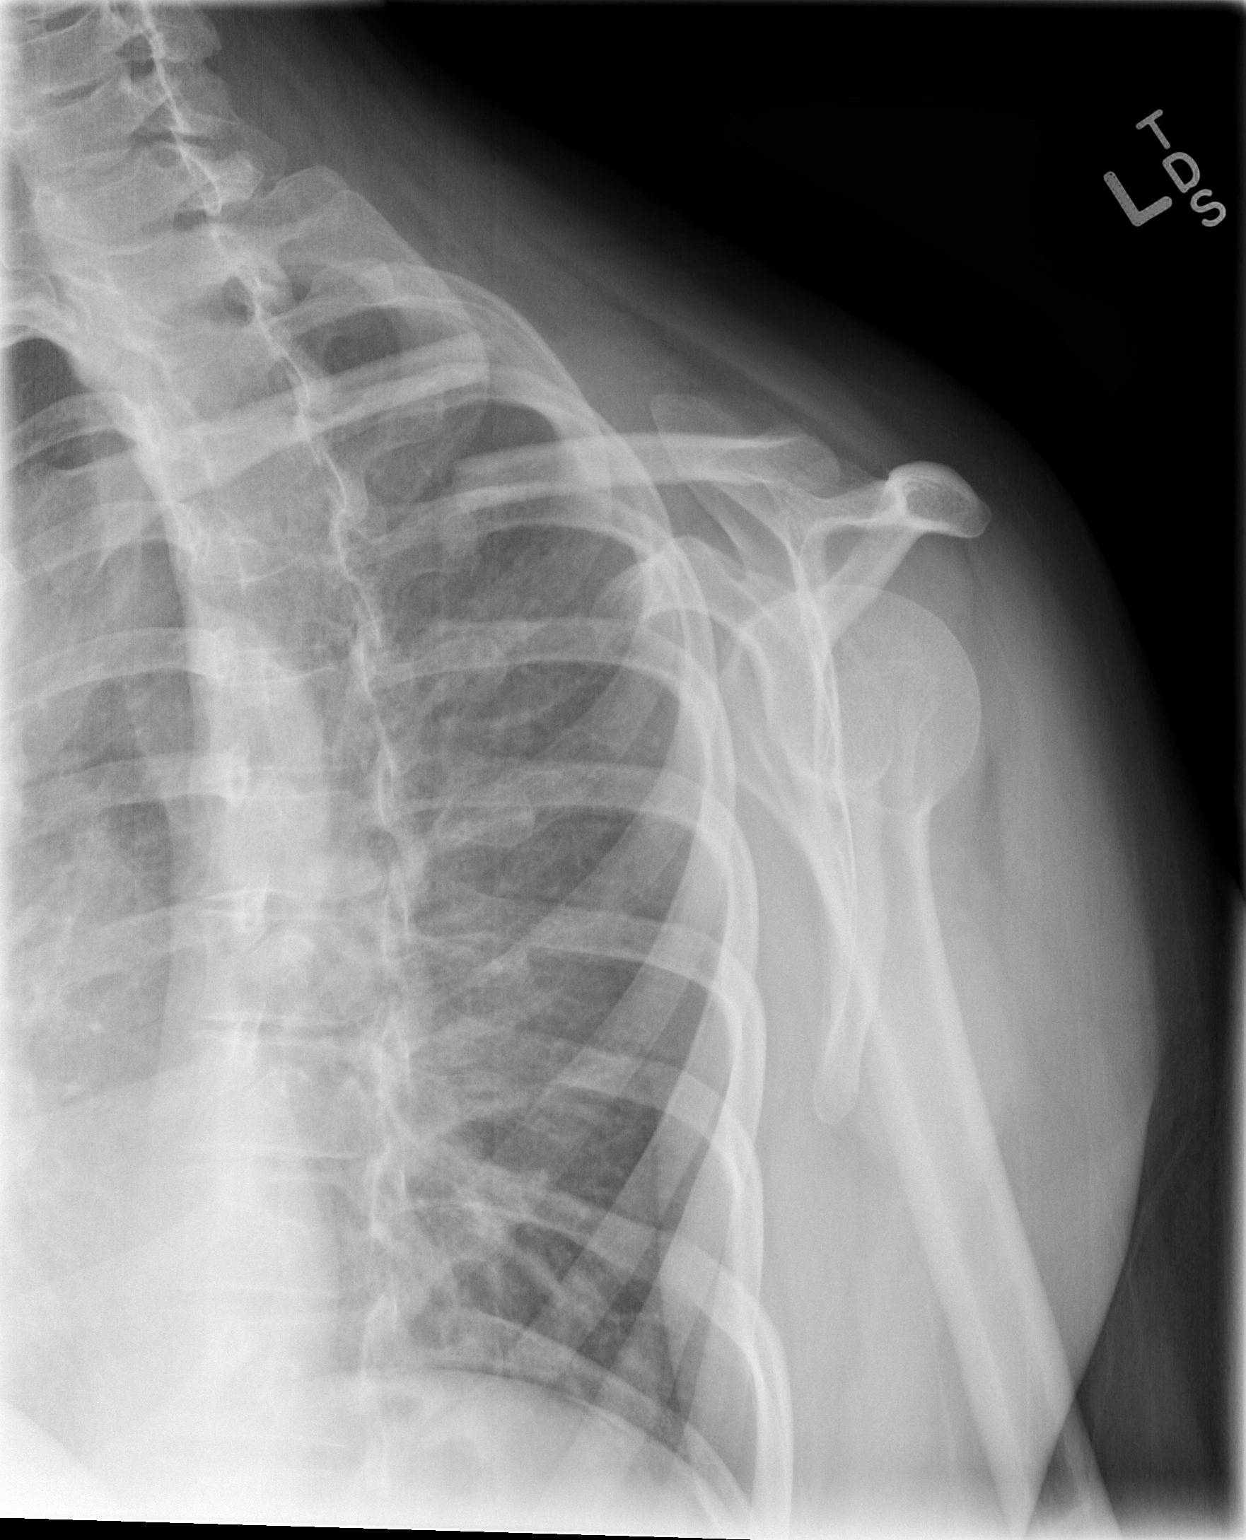

[2 of 2 positions shown; findings below may reference images not displayed]

FINDINGS: Mid clavicular shaft fracture with greater than two shaft
widths of inferior displacement of the distal fracture fragment.
Approximate 1.0 cm of override.

No fracture or dislocation about the glenohumeral joint.
IMPRESSION: Displace mid right clavicular fracture.

## 2015-04-20 IMAGING — RF DG C-ARM 61-120 MIN
1 series · 2 of 2 positions shown · non-contrast
Comparison: 08/26/2012

CLINICAL DATA: Intraoperative fixation left clavicular fracture

DG C-ARM 1-60 MIN,LEFT CLAVICLE - 2+ VIEWS

[Series 1: run · 2 of 2 slices shown]
[im 1/2]
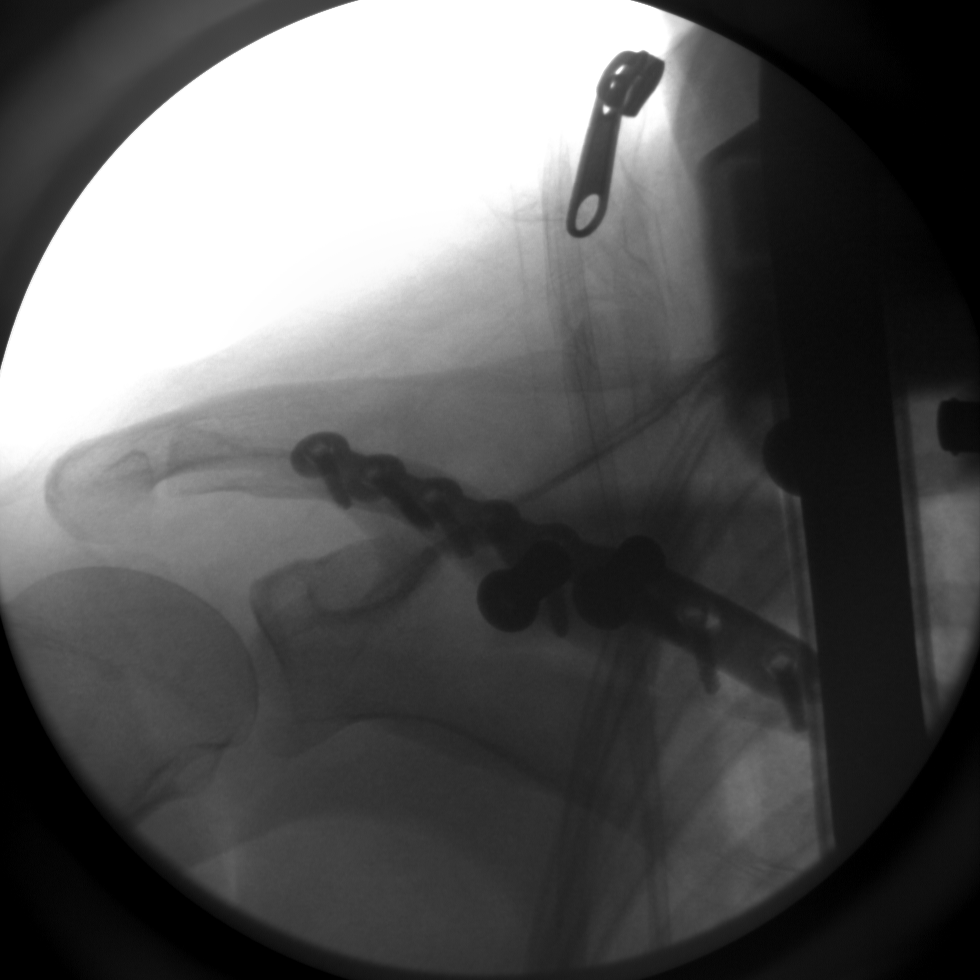
[im 2/2]
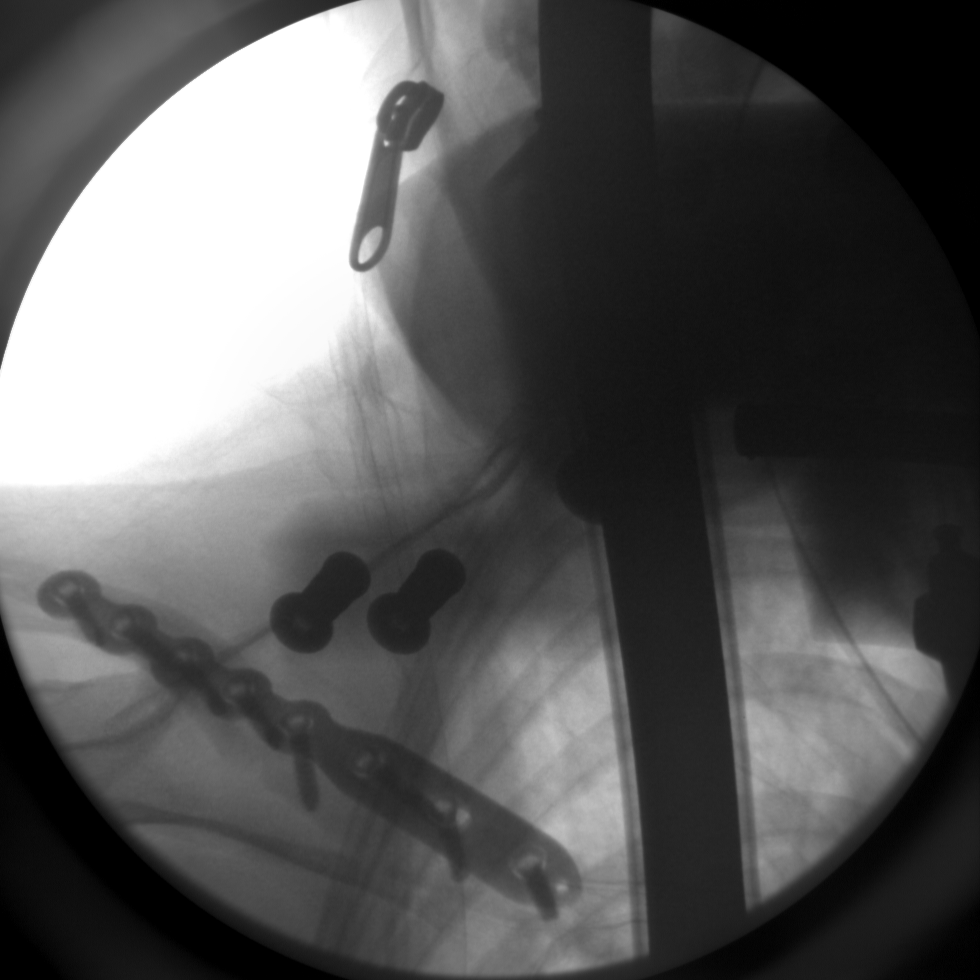

[2 of 2 positions shown; findings below may reference images not displayed]

FINDINGS: Two intraoperative images demonstrate side plate and
screw fixation of the previously seen midclavicular fracture on the
left with fracture fragments in near anatomic alignment.  No
evidence for hardware failure.
IMPRESSION: Expected appearance of the left clavicular fracture ORIF.

## 2015-04-20 IMAGING — CR DG CHEST 2V
2 series · 2 of 2 positions shown · non-contrast
Comparison: PA and lateral chest 09/08/2009.

CLINICAL DATA: Preoperative respiratory films for patient with a
clavicle fracture.

CHEST - 2 VIEW

[view not recorded (1 of 2)]
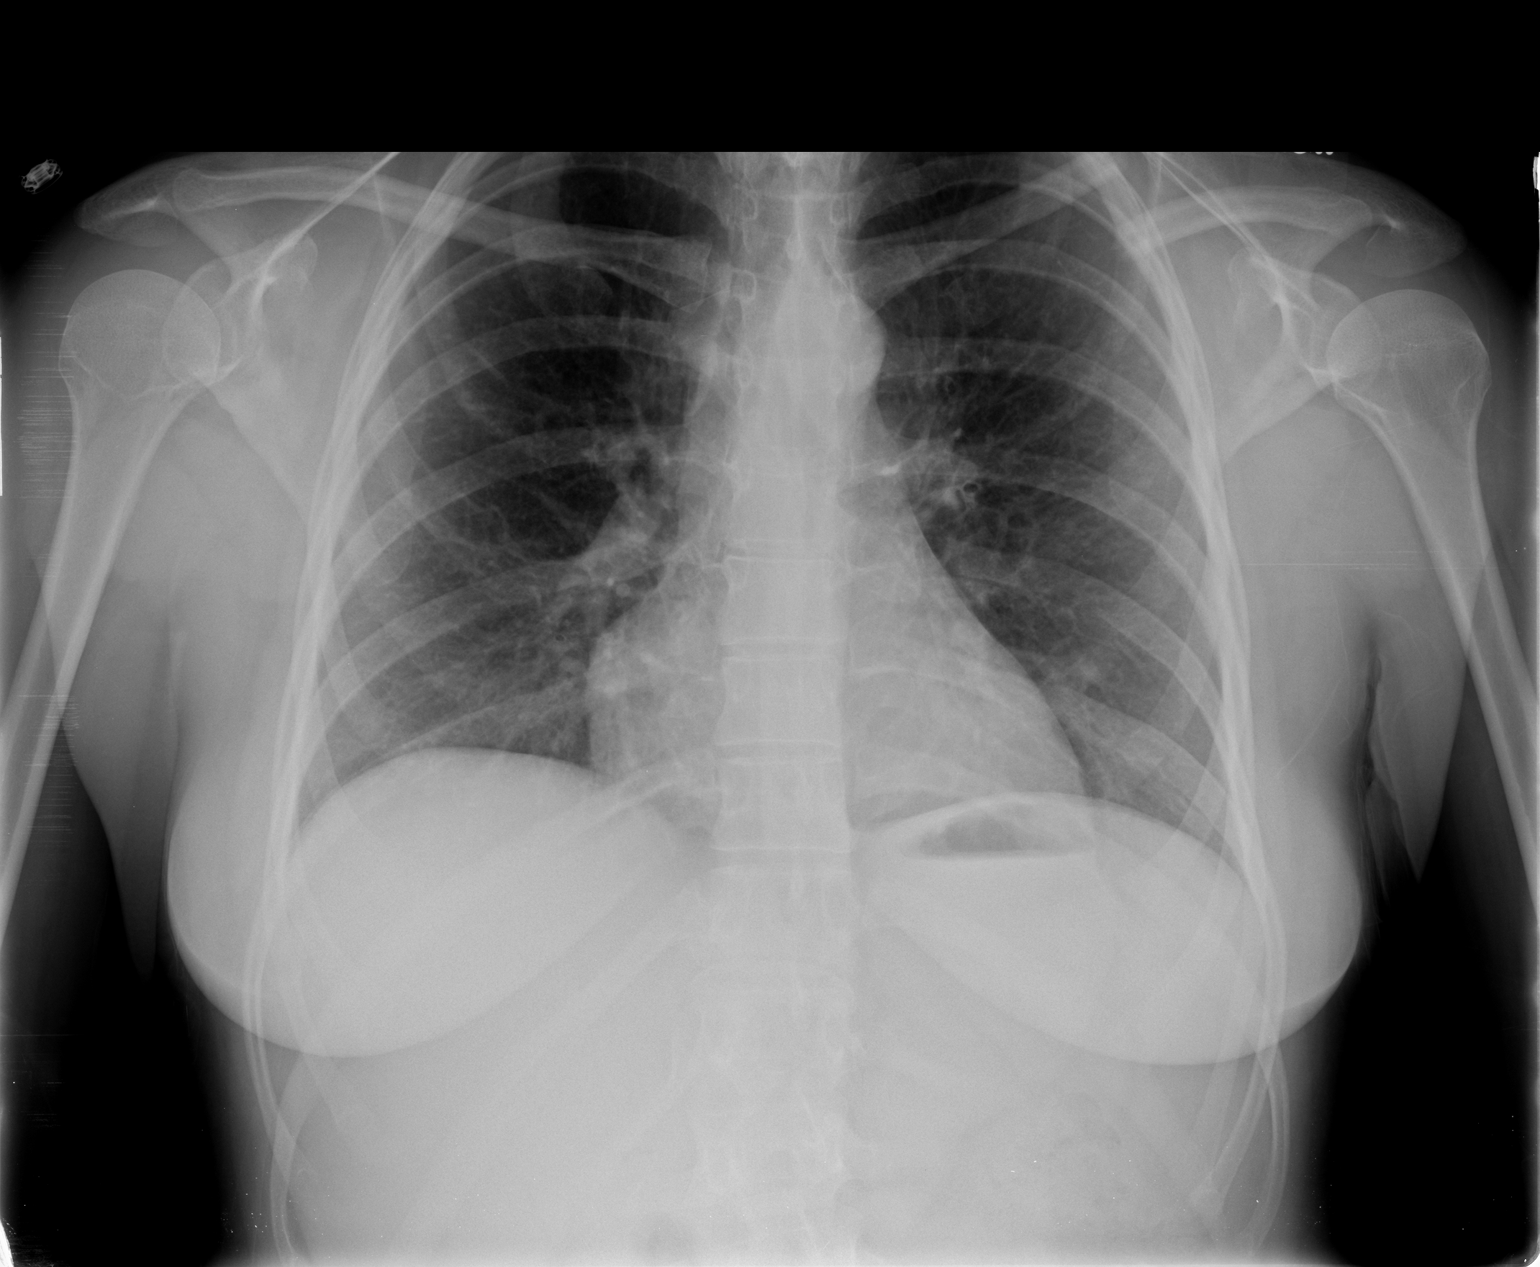

[view not recorded (2 of 2)]
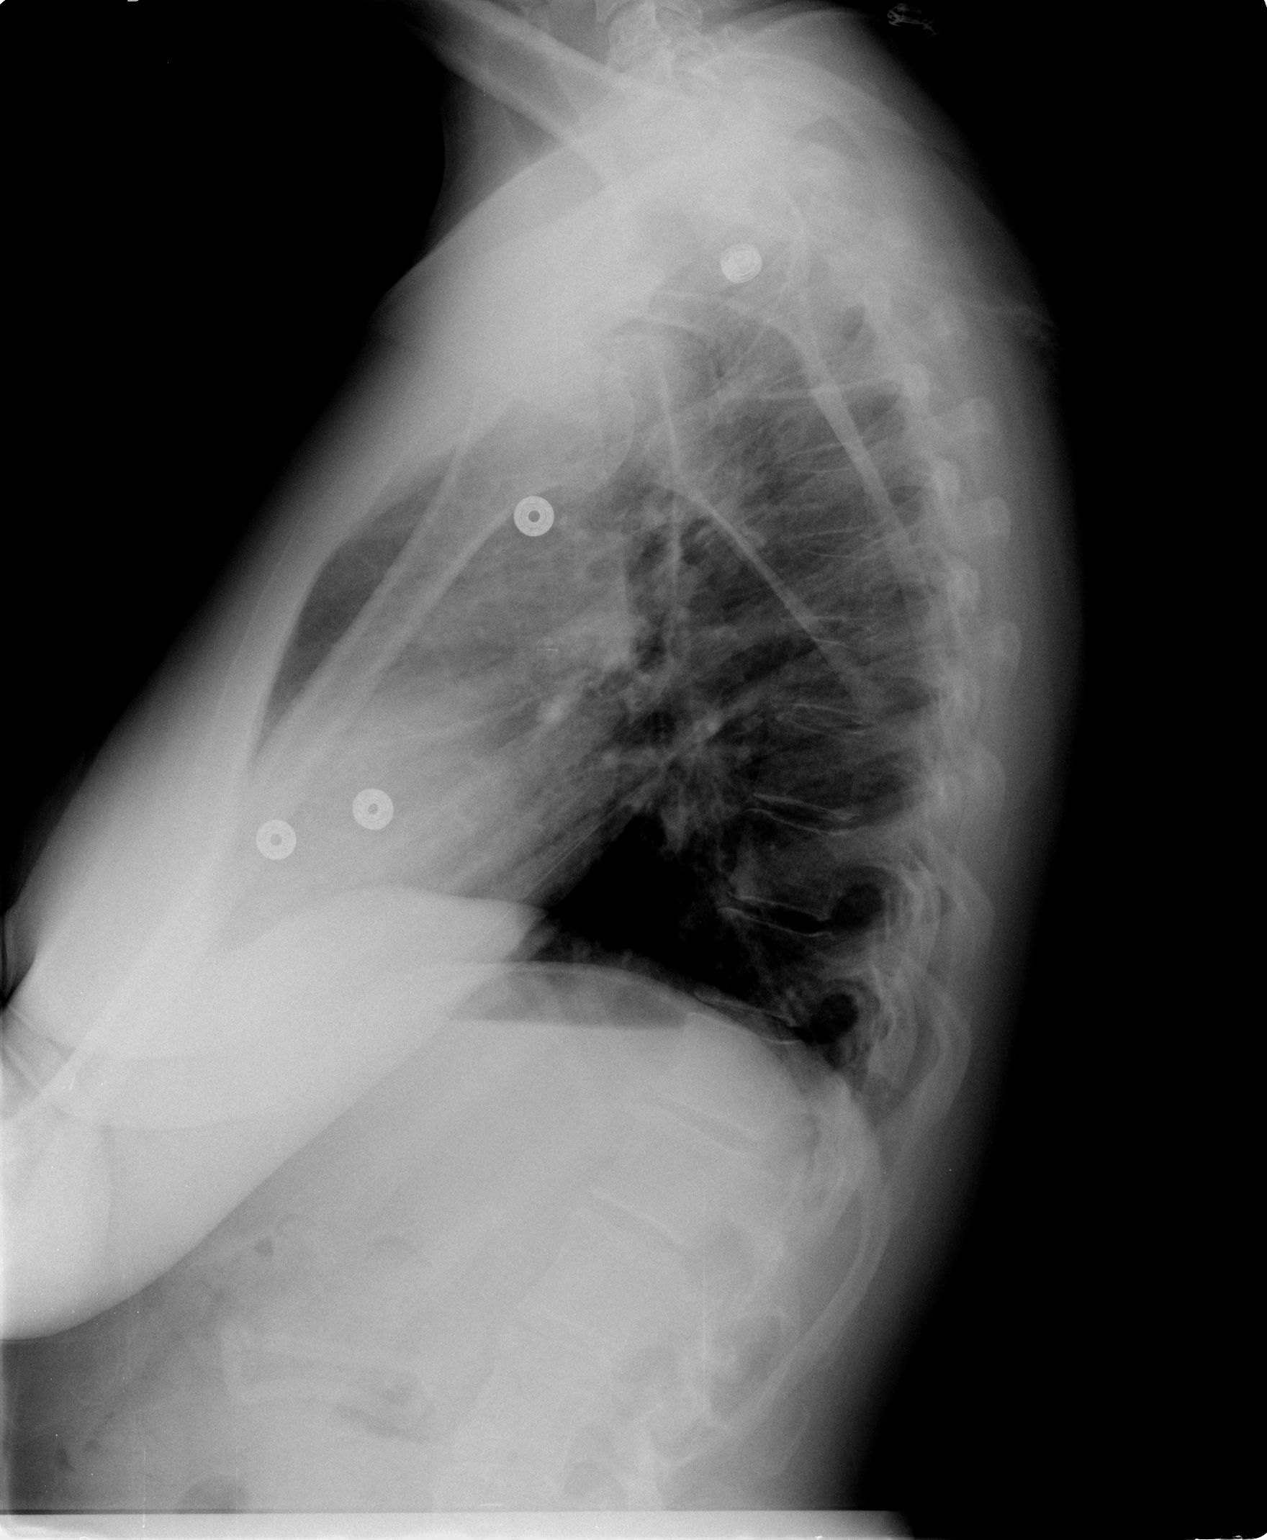

[2 of 2 positions shown; findings below may reference images not displayed]

FINDINGS: Lungs are clear.  Heart size is normal.  No pneumothorax
or pleural effusion.  Transverse fracture mid shaft left clavicle
with two shaft widths inferior displacement of the distal fragment
is noted.
IMPRESSION: 1.  No acute cardiopulmonary disease.
2.  Left clavicle fracture.

## 2015-04-21 ENCOUNTER — Other Ambulatory Visit (HOSPITAL_COMMUNITY): Payer: Self-pay | Admitting: *Deleted

## 2015-04-21 ENCOUNTER — Encounter (HOSPITAL_COMMUNITY): Payer: Self-pay

## 2015-04-21 ENCOUNTER — Encounter (HOSPITAL_COMMUNITY)
Admission: RE | Admit: 2015-04-21 | Discharge: 2015-04-21 | Disposition: A | Discharge status: Home / Self Care | Payer: Medicaid Other | Source: Ambulatory Visit | Attending: Orthopaedic Surgery | Admitting: Orthopaedic Surgery

## 2015-04-21 DIAGNOSIS — Z01812 Encounter for preprocedural laboratory examination: Secondary | ICD-10-CM | POA: Insufficient documentation

## 2015-04-21 DIAGNOSIS — M47812 Spondylosis without myelopathy or radiculopathy, cervical region: Secondary | ICD-10-CM | POA: Diagnosis not present

## 2015-04-21 DIAGNOSIS — M50221 Other cervical disc displacement at C4-C5 level: Secondary | ICD-10-CM | POA: Insufficient documentation

## 2015-04-21 HISTORY — DX: Unspecified osteoarthritis, unspecified site: M19.90

## 2015-04-21 HISTORY — DX: Gastro-esophageal reflux disease without esophagitis: K21.9

## 2015-04-21 HISTORY — DX: Essential (primary) hypertension: I10

## 2015-04-21 LAB — URINALYSIS, ROUTINE W REFLEX MICROSCOPIC
Bilirubin Urine: NEGATIVE
Glucose, UA: NEGATIVE mg/dL
HGB URINE DIPSTICK: NEGATIVE
Ketones, ur: NEGATIVE mg/dL
LEUKOCYTES UA: NEGATIVE
NITRITE: NEGATIVE
PROTEIN: NEGATIVE mg/dL
SPECIFIC GRAVITY, URINE: 1.011 (ref 1.005–1.030)
pH: 6.5 (ref 5.0–8.0)

## 2015-04-21 LAB — HCG, SERUM, QUALITATIVE: PREG SERUM: NEGATIVE

## 2015-04-21 LAB — CBC
HCT: 40.9 % (ref 36.0–46.0)
HEMOGLOBIN: 13.5 g/dL (ref 12.0–15.0)
MCH: 31.6 pg (ref 26.0–34.0)
MCHC: 33 g/dL (ref 30.0–36.0)
MCV: 95.8 fL (ref 78.0–100.0)
PLATELETS: 297 10*3/uL (ref 150–400)
RBC: 4.27 MIL/uL (ref 3.87–5.11)
RDW: 13.6 % (ref 11.5–15.5)
WBC: 3.4 10*3/uL — ABNORMAL LOW (ref 4.0–10.5)

## 2015-04-21 LAB — COMPREHENSIVE METABOLIC PANEL
ALBUMIN: 4 g/dL (ref 3.5–5.0)
ALK PHOS: 115 U/L (ref 38–126)
ALT: 129 U/L — ABNORMAL HIGH (ref 14–54)
ANION GAP: 10 (ref 5–15)
AST: 89 U/L — ABNORMAL HIGH (ref 15–41)
BUN: 12 mg/dL (ref 6–20)
CALCIUM: 10 mg/dL (ref 8.9–10.3)
CHLORIDE: 101 mmol/L (ref 101–111)
CO2: 26 mmol/L (ref 22–32)
Creatinine, Ser: 0.63 mg/dL (ref 0.44–1.00)
GFR calc Af Amer: >60 mL/min (ref >60)
GFR calc non Af Amer: >60 mL/min (ref >60)
GLUCOSE: 85 mg/dL (ref 65–99)
Potassium: 4.4 mmol/L (ref 3.5–5.1)
SODIUM: 137 mmol/L (ref 135–145)
Total Bilirubin: 0.3 mg/dL (ref 0.3–1.2)
Total Protein: 7.1 g/dL (ref 6.5–8.1)

## 2015-04-21 LAB — PROTIME-INR
INR: 0.87 (ref 0.00–1.49)
Prothrombin Time: 12 seconds (ref 11.6–15.2)

## 2015-04-21 LAB — SURGICAL PCR SCREEN
MRSA, PCR: NEGATIVE
STAPHYLOCOCCUS AUREUS: POSITIVE — AB

## 2015-04-21 MED ORDER — CHLORHEXIDINE GLUCONATE 4 % EX LIQD: 60.0000 mL | Freq: Once | CUTANEOUS | Status: DC

## 2015-04-21 MED ORDER — CEFAZOLIN SODIUM-DEXTROSE 2-3 GM-% IV SOLR
2.0000 g | INTRAVENOUS | Status: AC
  Administered 2015-04-22: 2 g via INTRAVENOUS
  Filled 2015-04-21: qty 50

## 2015-04-21 NOTE — Pre-Procedure Instructions (Addendum)
    ARALYNN BRAKE  04/21/2015      [id number] - [place], [place] - [address] [place] [address] Phone: [phone] Fax: [phone]  [id number] - [place], [place] - [address] [place] [address] Phone: [phone] Fax: [phone]    Your procedure is scheduled on Wednesday, April 22, 2015 at 12:35 PM.   Report to [facility] Entrance "A" Admitting Office at 10:30 AM.   Call this number if you have problems the morning of surgery: [phone]    Remember:  Do not eat food or drink liquids after midnight tonight.  Take these medicines the morning of surgery with A SIP OF WATER: Metoprolol (Toprol SL), Venlafaxine (Effexor)             (Stop taking any anti-inflammatories, aspirin, herbal medications and supplements, vitamins, 4-5 days prior to surgery)   Do not wear jewelry, make-up or nail polish.  Do not wear lotions, powders, or perfumes.  You may NOT wear deodorant the day of surgery.  Do not shave underarms & legs 48 hours prior to surgery.     Do not bring valuables to the hospital.  [facility] is not responsible for any belongings or valuables.  Contacts, dentures or bridgework may not be worn into surgery.  Leave your suitcase in the car.  After surgery it may be brought to your room.  For patients admitted to the hospital, discharge time will be determined by your treatment team.  Special instructions:  See "Preparing for Surgery" Instruction sheet.  Please read over the following fact sheets that you were given. Pain Booklet, Coughing and Deep Breathing, MRSA Information and Surgical Site Infection Prevention

## 2015-04-21 NOTE — H&P (Signed)
[name] is an 40 y.o. female.    The patient returns for followup of mallet finger.  She states her dog chewed up her Stax splint yesterday.  She brought the splint in.  It certainly has been chewed and flattened.  This is the second one that her dog has chewed.  There is no extension lag in her finger.  The patient continues to have ongoing pain with back pain between the scapula, pain over the trapezius, pain that radiates in her shoulders, which has been present for more than 2 years.  She has been through physical therapy, taken a prednisone pack in the past.  MRI scan showed spondylosis, superimposed left-sided disk herniations seen best on sagittal images and chronic spondylosis, disk space height loss, bone spurs and effacement of the cord without cord deformity at C5-6.  Minimal change at other levels.  The patient states that the neck is waking her up at night.  It bothers her on a daily basis.  None of the treatment has been effective.  She does not want to take narcotic medication, even though she has had several prescriptions in the past.  She denies any lower extremity weakness.  Previously treated ankle fracture and clavicle fracture both doing well.     PAST MEDICAL HISTORY:  Positive for arthritis, clavicle fracture, ankle fracture, asthma, hepatitis and old head injury.   FAMILY HISTORY:  Negative for spondylitic changes, anesthetic problems or bleeding problems.   SOCIAL HISTORY:  The patient is single.  She smokes one-half pack per day.  Does not drink.     Past Medical History  Diagnosis Date  . Fibromyalgia, primary   . Anxiety   . Depressed   . Nephrolithiasis   . UTI (lower urinary tract infection)   . Kidney stone   . Hepatitis C   . Fibromyalgia   . Carpal tunnel syndrome   . Dysrhythmia     'skipped beats every now and then"  . Hypertension     dx a couple of months ago  . Emphysema     does smoke, and has slowed down  . COPD (chronic obstructive  pulmonary disease) ([place])   . Asthma     last flareup was yr or so with bronchitis  . GERD (gastroesophageal reflux disease)     periodic indigestion  . Arthritis     Past Surgical History  Procedure Laterality Date  . Abdominal hysterectomy    . Orif ankle fracture  02/03/2012    Procedure: OPEN REDUCTION INTERNAL FIXATION (ORIF) ANKLE FRACTURE;  Surgeon: Newt Minion, MD;  Location: [facility];  Service: Orthopedics;  Laterality: Right;  Open Reduction Internal Fixation Right Ankle  . Tubal ligation    . Wisdom tooth extraction    . Orif clavicular fracture Left 08/29/2012    Dr Lorin Mercy  . Orif clavicular fracture Left 08/29/2012    Procedure: OPEN REDUCTION INTERNAL FIXATION (ORIF) CLAVICULAR FRACTURE;  Surgeon: Marybelle Killings, MD;  Location: [facility];  Service: Orthopedics;  Laterality: Left;  Open Reduction Internal Fixation Left Clavicle Fracture  . Carpal tunnel release      right hand    Family History  Problem Relation Age of Onset  . Alcohol abuse Father   . Cirrhosis Father   . Cancer Father   . Hyperthyroidism Sister   . Bipolar disorder Sister   . Kidney disease Brother   . Heart attack Mother   . Hypertension Mother    Social  History:  reports that she has been smoking Cigarettes.  She has a 4 pack-year smoking history. She has never used smokeless tobacco. She reports that she does not drink alcohol or use illicit drugs.  Allergies:  Allergies  Allergen Reactions  . Sulfa Antibiotics Nausea And Vomiting, Swelling and Rash    Throat swells  . Eggs Or Egg-Derived Products Other (See Comments)    Bad pain  . Milk-Related Compounds Other (See Comments)    Bad pain    No prescriptions prior to admission    Results for orders placed or performed during the hospital encounter of 04/21/15 (from the past 48 hour(s))  CBC     Status: Abnormal   Collection Time: 04/21/15 10:47 AM  Result Value Ref Range   WBC 3.4 (L) 4.0 - 10.5 K/uL   RBC 4.27 3.87 - 5.11 MIL/uL    Hemoglobin 13.5 12.0 - 15.0 g/dL   HCT 40.9 36.0 - 46.0 %   MCV 95.8 78.0 - 100.0 fL   MCH 31.6 26.0 - 34.0 pg   MCHC 33.0 30.0 - 36.0 g/dL   RDW 13.6 11.5 - 15.5 %   Platelets 297 150 - 400 K/uL  Comprehensive metabolic panel     Status: Abnormal   Collection Time: 04/21/15 10:47 AM  Result Value Ref Range   Sodium 137 135 - 145 mmol/L   Potassium 4.4 3.5 - 5.1 mmol/L   Chloride 101 101 - 111 mmol/L   CO2 26 22 - 32 mmol/L   Glucose, Bld 85 65 - 99 mg/dL   BUN 12 6 - 20 mg/dL   Creatinine, Ser 0.63 0.44 - 1.00 mg/dL   Calcium 10.0 8.9 - 10.3 mg/dL   Total Protein 7.1 6.5 - 8.1 g/dL   Albumin 4.0 3.5 - 5.0 g/dL   AST 89 (H) 15 - 41 U/L   ALT 129 (H) 14 - 54 U/L   Alkaline Phosphatase 115 38 - 126 U/L   Total Bilirubin 0.3 0.3 - 1.2 mg/dL   GFR calc non Af Amer >60 >60 mL/min   GFR calc Af Amer >60 >60 mL/min    Comment: (NOTE) The eGFR has been calculated using the CKD EPI equation. This calculation has not been validated in all clinical situations. eGFR's persistently <60 mL/min signify possible Chronic Kidney Disease.    Anion gap 10 5 - 15  Protime-INR     Status: None   Collection Time: 04/21/15 10:47 AM  Result Value Ref Range   Prothrombin Time 12.0 11.6 - 15.2 seconds   INR 0.87 0.00 - 1.49  hCG, serum, qualitative     Status: None   Collection Time: 04/21/15 10:47 AM  Result Value Ref Range   Preg, Serum NEGATIVE NEGATIVE    Comment:        THE SENSITIVITY OF THIS METHODOLOGY IS >10 mIU/mL.   Surgical pcr screen     Status: Abnormal   Collection Time: 04/21/15 11:15 AM  Result Value Ref Range   MRSA, PCR NEGATIVE NEGATIVE   Staphylococcus aureus POSITIVE (A) NEGATIVE    Comment:        The Xpert SA Assay (FDA approved for NASAL specimens in patients over 11 years of age), is one component of a comprehensive surveillance program.  Test performance has been validated by [facility] for patients greater than or equal to 42 year old. It is not  intended to diagnose infection nor to guide or monitor treatment.   Urinalysis, Routine w  reflex microscopic (not at [facility])     Status: None   Collection Time: 04/21/15 11:46 AM  Result Value Ref Range   Color, Urine YELLOW YELLOW   APPearance CLEAR CLEAR   Specific Gravity, Urine 1.011 1.005 - 1.030   pH 6.5 5.0 - 8.0   Glucose, UA NEGATIVE NEGATIVE mg/dL   Hgb urine dipstick NEGATIVE NEGATIVE   Bilirubin Urine NEGATIVE NEGATIVE   Ketones, ur NEGATIVE NEGATIVE mg/dL   Protein, ur NEGATIVE NEGATIVE mg/dL   Nitrite NEGATIVE NEGATIVE   Leukocytes, UA NEGATIVE NEGATIVE    Comment: MICROSCOPIC NOT DONE ON URINES WITH NEGATIVE PROTEIN, BLOOD, LEUKOCYTES, NITRITE, OR GLUCOSE <1000 mg/dL.   No results found.  Review of Systems  Constitutional: Negative.   Eyes: Negative.   Respiratory: Negative.   Cardiovascular: Negative.   Genitourinary: Negative.   Musculoskeletal: Positive for neck pain.  Skin: Negative.   Endo/Heme/Allergies: Negative.   Psychiatric/Behavioral: Negative.     There were no vitals taken for this visit. Physical Exam  Constitutional: She is oriented to person, place, and time. No distress.  HENT:  Head: Atraumatic.  Eyes: EOM are normal.  Cardiovascular: Normal rate.   Respiratory: No respiratory distress.  GI: She exhibits no distension.  Neurological: She is alert and oriented to person, place, and time.  Skin: Skin is warm and dry.  Psychiatric: She has a normal mood and affect.     PHYSICAL EXAMINATION:  The patient is 5 feet 6 inches, 153 pounds.   Alert and oriented.  WDWN.  NAD.  Brachial plexus tenderness, worse on the left than right.  Positive Spurling on the left, negative on the right.  Negative Lhermitte.  Upper extremity reflexes are 2+ and symmetrical.  Negative  Romberg sign.  No lower extremity hyperreflexia.  Fingers reach full extension including the mallet finger.  No biceps/triceps weakness.  Lungs are clear.  Heart regular rate and  rhythm.  Well-healed clavicle incision from ORIF on the left.  No abdominal tenderness.  Normal heel/toe gait.  Normal lower extremity reflexes.     ASSESSMENT:  Cervical spondylosis.  Healed bony mallet deformity.     PLAN:  Spondylosis, she states she would like to proceed with surgical treatment and this would require 2-level cervical fusion, C4-5, C5-6.  Overnight stay in the hospital.  Soft collar, allograft and plate.  Risk of dysphagia, dysphonia, pseudoarthrosis, reoperation, importance of compliance wearing the collar discussed.  All questions answered.  She understands and requests we proceed.  She has been through a prednisone dose pack, physical therapy, activity modification, home stretching exercises, ibuprofen, Naprosyn, prednisone pack x2 without relief.   Tomothy Eddins M 04/21/2015, 3:37 PM

## 2015-04-21 NOTE — Progress Notes (Signed)
Patient was positive for staph.  Will need betadine nasal ointment as surgery is tomorrow.

## 2015-04-22 ENCOUNTER — Observation Stay (HOSPITAL_COMMUNITY)
Admission: RE | Admit: 2015-04-22 | Discharge: 2015-04-23 | Disposition: A | Discharge status: Home / Self Care | Payer: Medicaid Other | Source: Ambulatory Visit | Attending: Orthopaedic Surgery | Admitting: Orthopaedic Surgery

## 2015-04-22 ENCOUNTER — Ambulatory Visit (HOSPITAL_COMMUNITY): Payer: Medicaid Other | Admitting: Anesthesiology

## 2015-04-22 ENCOUNTER — Encounter (HOSPITAL_COMMUNITY)
Admission: RE | Disposition: A | Discharge status: Home / Self Care | Payer: Self-pay | Source: Ambulatory Visit | Attending: Orthopaedic Surgery

## 2015-04-22 ENCOUNTER — Ambulatory Visit (HOSPITAL_COMMUNITY): Payer: Medicaid Other

## 2015-04-22 ENCOUNTER — Encounter (HOSPITAL_COMMUNITY): Payer: Self-pay | Admitting: *Deleted

## 2015-04-22 DIAGNOSIS — M797 Fibromyalgia: Secondary | ICD-10-CM | POA: Insufficient documentation

## 2015-04-22 DIAGNOSIS — Z91012 Allergy to eggs: Secondary | ICD-10-CM | POA: Diagnosis not present

## 2015-04-22 DIAGNOSIS — F17210 Nicotine dependence, cigarettes, uncomplicated: Secondary | ICD-10-CM | POA: Insufficient documentation

## 2015-04-22 DIAGNOSIS — G5600 Carpal tunnel syndrome, unspecified upper limb: Secondary | ICD-10-CM | POA: Diagnosis not present

## 2015-04-22 DIAGNOSIS — K219 Gastro-esophageal reflux disease without esophagitis: Secondary | ICD-10-CM | POA: Insufficient documentation

## 2015-04-22 DIAGNOSIS — M50221 Other cervical disc displacement at C4-C5 level: Secondary | ICD-10-CM | POA: Diagnosis not present

## 2015-04-22 DIAGNOSIS — B1920 Unspecified viral hepatitis C without hepatic coma: Secondary | ICD-10-CM | POA: Diagnosis not present

## 2015-04-22 DIAGNOSIS — Q761 Klippel-Feil syndrome: Secondary | ICD-10-CM

## 2015-04-22 DIAGNOSIS — Z882 Allergy status to sulfonamides status: Secondary | ICD-10-CM | POA: Diagnosis not present

## 2015-04-22 DIAGNOSIS — F319 Bipolar disorder, unspecified: Secondary | ICD-10-CM | POA: Insufficient documentation

## 2015-04-22 DIAGNOSIS — M1990 Unspecified osteoarthritis, unspecified site: Secondary | ICD-10-CM | POA: Diagnosis not present

## 2015-04-22 DIAGNOSIS — I10 Essential (primary) hypertension: Secondary | ICD-10-CM | POA: Diagnosis not present

## 2015-04-22 DIAGNOSIS — J45909 Unspecified asthma, uncomplicated: Secondary | ICD-10-CM | POA: Insufficient documentation

## 2015-04-22 DIAGNOSIS — J449 Chronic obstructive pulmonary disease, unspecified: Secondary | ICD-10-CM | POA: Diagnosis not present

## 2015-04-22 DIAGNOSIS — Z91011 Allergy to milk products: Secondary | ICD-10-CM | POA: Diagnosis not present

## 2015-04-22 DIAGNOSIS — Z87442 Personal history of urinary calculi: Secondary | ICD-10-CM | POA: Insufficient documentation

## 2015-04-22 DIAGNOSIS — Z419 Encounter for procedure for purposes other than remedying health state, unspecified: Secondary | ICD-10-CM

## 2015-04-22 DIAGNOSIS — M47812 Spondylosis without myelopathy or radiculopathy, cervical region: Principal | ICD-10-CM | POA: Insufficient documentation

## 2015-04-22 HISTORY — PX: ANTERIOR CERVICAL DECOMP/DISCECTOMY FUSION: SHX1161

## 2015-04-22 SURGERY — ANTERIOR CERVICAL DECOMPRESSION/DISCECTOMY FUSION 2 LEVELS
Anesthesia: General | Site: Neck

## 2015-04-22 MED ORDER — METHOCARBAMOL 1000 MG/10ML IJ SOLN: 500.0000 mg | Freq: Four times a day (QID) | INTRAVENOUS | Status: DC | PRN

## 2015-04-22 MED ORDER — HEMOSTATIC AGENTS (NO CHARGE) OPTIME
TOPICAL | Status: DC | PRN
  Administered 2015-04-22: 1 via TOPICAL

## 2015-04-22 MED ORDER — LIDOCAINE HCL (CARDIAC) 20 MG/ML IV SOLN
INTRAVENOUS | Status: DC | PRN
  Administered 2015-04-22: 60 mg via INTRAVENOUS

## 2015-04-22 MED ORDER — MIDAZOLAM HCL 2 MG/2ML IJ SOLN
INTRAMUSCULAR | Status: AC
  Filled 2015-04-22: qty 2

## 2015-04-22 MED ORDER — OXYCODONE HCL 5 MG PO TABS
ORAL_TABLET | ORAL | Status: AC
  Filled 2015-04-22: qty 1

## 2015-04-22 MED ORDER — HYDROMORPHONE HCL 1 MG/ML IJ SOLN
0.5000 mg | INTRAMUSCULAR | Status: DC | PRN
  Administered 2015-04-22 – 2015-04-23 (×5): 1 mg via INTRAVENOUS
  Filled 2015-04-22 (×5): qty 1

## 2015-04-22 MED ORDER — METHOCARBAMOL 500 MG PO TABS: 500.0000 mg | ORAL_TABLET | Freq: Four times a day (QID) | ORAL | Status: DC

## 2015-04-22 MED ORDER — NEOSTIGMINE METHYLSULFATE 10 MG/10ML IV SOLN
INTRAVENOUS | Status: AC
  Filled 2015-04-22: qty 1

## 2015-04-22 MED ORDER — FENTANYL CITRATE (PF) 250 MCG/5ML IJ SOLN
INTRAMUSCULAR | Status: AC
  Filled 2015-04-22: qty 5

## 2015-04-22 MED ORDER — KETAMINE HCL 100 MG/ML IJ SOLN
INTRAMUSCULAR | Status: DC | PRN
  Administered 2015-04-22 (×2): 75 mg via INTRAVENOUS

## 2015-04-22 MED ORDER — LACTATED RINGERS IV SOLN
INTRAVENOUS | Status: DC
  Administered 2015-04-22 (×3): via INTRAVENOUS

## 2015-04-22 MED ORDER — 0.9 % SODIUM CHLORIDE (POUR BTL) OPTIME
TOPICAL | Status: DC | PRN
  Administered 2015-04-22: 1000 mL

## 2015-04-22 MED ORDER — METOPROLOL SUCCINATE ER 25 MG PO TB24
25.0000 mg | ORAL_TABLET | Freq: Every day | ORAL | Status: DC
  Administered 2015-04-23: 25 mg via ORAL
  Filled 2015-04-22: qty 1

## 2015-04-22 MED ORDER — BUPIVACAINE-EPINEPHRINE (PF) 0.25% -1:200000 IJ SOLN
INTRAMUSCULAR | Status: AC
  Filled 2015-04-22: qty 30

## 2015-04-22 MED ORDER — ONDANSETRON HCL 4 MG/2ML IJ SOLN
INTRAMUSCULAR | Status: AC
  Filled 2015-04-22: qty 2

## 2015-04-22 MED ORDER — GLYCOPYRROLATE 0.2 MG/ML IJ SOLN
INTRAMUSCULAR | Status: DC | PRN
  Administered 2015-04-22: 0.4 mg via INTRAVENOUS

## 2015-04-22 MED ORDER — PHENOL 1.4 % MT LIQD
1.0000 | OROMUCOSAL | Status: DC | PRN
Start: 2015-04-22 — End: 2015-04-23

## 2015-04-22 MED ORDER — METHOCARBAMOL 500 MG PO TABS
ORAL_TABLET | ORAL | Status: AC
  Filled 2015-04-22: qty 1

## 2015-04-22 MED ORDER — TRAZODONE HCL 50 MG PO TABS: 50.0000 mg | ORAL_TABLET | Freq: Every evening | ORAL | Status: DC | PRN

## 2015-04-22 MED ORDER — OXYCODONE HCL 5 MG PO TABS
5.0000 mg | ORAL_TABLET | Freq: Once | ORAL | Status: AC | PRN
  Administered 2015-04-22: 5 mg via ORAL

## 2015-04-22 MED ORDER — OXYCODONE-ACETAMINOPHEN 5-325 MG PO TABS
1.0000 | ORAL_TABLET | ORAL | Status: DC | PRN
  Administered 2015-04-22 – 2015-04-23 (×3): 2 via ORAL
  Filled 2015-04-22 (×3): qty 2

## 2015-04-22 MED ORDER — LIDOCAINE HCL (CARDIAC) 20 MG/ML IV SOLN
INTRAVENOUS | Status: AC
  Filled 2015-04-22: qty 5

## 2015-04-22 MED ORDER — ACETAMINOPHEN 160 MG/5ML PO SOLN
325.0000 mg | ORAL | Status: DC | PRN
  Filled 2015-04-22: qty 20.3

## 2015-04-22 MED ORDER — HYDROMORPHONE HCL 1 MG/ML IJ SOLN
INTRAMUSCULAR | Status: AC
  Filled 2015-04-22: qty 1

## 2015-04-22 MED ORDER — FENTANYL CITRATE (PF) 100 MCG/2ML IJ SOLN
INTRAMUSCULAR | Status: DC | PRN
  Administered 2015-04-22 (×3): 100 ug via INTRAVENOUS
  Administered 2015-04-22: 150 ug via INTRAVENOUS
  Administered 2015-04-22 (×5): 100 ug via INTRAVENOUS

## 2015-04-22 MED ORDER — PROPOFOL 10 MG/ML IV BOLUS
INTRAVENOUS | Status: DC | PRN
  Administered 2015-04-22: 200 mg via INTRAVENOUS

## 2015-04-22 MED ORDER — ACETAMINOPHEN 325 MG PO TABS: 650.0000 mg | ORAL_TABLET | ORAL | Status: DC | PRN

## 2015-04-22 MED ORDER — GLYCOPYRROLATE 0.2 MG/ML IJ SOLN
INTRAMUSCULAR | Status: AC
  Filled 2015-04-22: qty 2

## 2015-04-22 MED ORDER — METHOCARBAMOL 500 MG PO TABS
500.0000 mg | ORAL_TABLET | Freq: Four times a day (QID) | ORAL | Status: DC | PRN
  Administered 2015-04-22 – 2015-04-23 (×3): 500 mg via ORAL
  Filled 2015-04-22 (×2): qty 1

## 2015-04-22 MED ORDER — ROCURONIUM BROMIDE 100 MG/10ML IV SOLN
INTRAVENOUS | Status: DC | PRN
  Administered 2015-04-22: 50 mg via INTRAVENOUS

## 2015-04-22 MED ORDER — ACETAMINOPHEN 325 MG PO TABS: 325.0000 mg | ORAL_TABLET | ORAL | Status: DC | PRN

## 2015-04-22 MED ORDER — SODIUM CHLORIDE 0.9% FLUSH: 3.0000 mL | INTRAVENOUS | Status: DC | PRN

## 2015-04-22 MED ORDER — KETAMINE HCL 100 MG/ML IJ SOLN
INTRAMUSCULAR | Status: AC
  Filled 2015-04-22: qty 1

## 2015-04-22 MED ORDER — SODIUM CHLORIDE 0.9 % IV SOLN: 250.0000 mL | INTRAVENOUS | Status: DC

## 2015-04-22 MED ORDER — PROPOFOL 10 MG/ML IV BOLUS
INTRAVENOUS | Status: AC
  Filled 2015-04-22: qty 20

## 2015-04-22 MED ORDER — DOCUSATE SODIUM 100 MG PO CAPS
100.0000 mg | ORAL_CAPSULE | Freq: Two times a day (BID) | ORAL | Status: DC
  Administered 2015-04-22 – 2015-04-23 (×2): 100 mg via ORAL
  Filled 2015-04-22 (×2): qty 1

## 2015-04-22 MED ORDER — OXYCODONE-ACETAMINOPHEN 5-325 MG PO TABS: 1.0000 | ORAL_TABLET | Freq: Four times a day (QID) | ORAL | Status: DC | PRN

## 2015-04-22 MED ORDER — HYDROMORPHONE HCL 1 MG/ML IJ SOLN
0.2500 mg | INTRAMUSCULAR | Status: DC | PRN
  Administered 2015-04-22 (×4): 0.5 mg via INTRAVENOUS

## 2015-04-22 MED ORDER — POLYETHYLENE GLYCOL 3350 17 G PO PACK
17.0000 g | PACK | Freq: Every day | ORAL | Status: DC | PRN
Start: 2015-04-22 — End: 2015-04-23

## 2015-04-22 MED ORDER — POTASSIUM CHLORIDE IN NACL 20-0.45 MEQ/L-% IV SOLN
INTRAVENOUS | Status: DC
  Administered 2015-04-22: 19:00:00 via INTRAVENOUS
  Filled 2015-04-22 (×3): qty 1000

## 2015-04-22 MED ORDER — VENLAFAXINE HCL ER 75 MG PO CP24
225.0000 mg | ORAL_CAPSULE | Freq: Every morning | ORAL | Status: DC
  Administered 2015-04-23: 225 mg via ORAL
  Filled 2015-04-22: qty 1

## 2015-04-22 MED ORDER — MIDAZOLAM HCL 5 MG/5ML IJ SOLN
INTRAMUSCULAR | Status: DC | PRN
  Administered 2015-04-22 (×2): 2 mg via INTRAVENOUS

## 2015-04-22 MED ORDER — MENTHOL 3 MG MT LOZG
1.0000 | LOZENGE | OROMUCOSAL | Status: DC | PRN
  Filled 2015-04-22: qty 9

## 2015-04-22 MED ORDER — CEFAZOLIN SODIUM 1-5 GM-% IV SOLN
1.0000 g | Freq: Three times a day (TID) | INTRAVENOUS | Status: AC
  Administered 2015-04-22 – 2015-04-23 (×2): 1 g via INTRAVENOUS
  Filled 2015-04-22 (×2): qty 50

## 2015-04-22 MED ORDER — OXYCODONE HCL 5 MG/5ML PO SOLN: 5.0000 mg | Freq: Once | ORAL | Status: AC | PRN

## 2015-04-22 MED ORDER — ONDANSETRON HCL 4 MG/2ML IJ SOLN
4.0000 mg | INTRAMUSCULAR | Status: DC | PRN
  Administered 2015-04-22: 4 mg via INTRAVENOUS
  Filled 2015-04-22: qty 2

## 2015-04-22 MED ORDER — NEOSTIGMINE METHYLSULFATE 10 MG/10ML IV SOLN
INTRAVENOUS | Status: DC | PRN
  Administered 2015-04-22: 3 mg via INTRAVENOUS

## 2015-04-22 MED ORDER — SODIUM CHLORIDE 0.9% FLUSH
3.0000 mL | Freq: Two times a day (BID) | INTRAVENOUS | Status: DC
  Administered 2015-04-23: 3 mL via INTRAVENOUS

## 2015-04-22 MED ORDER — ACETAMINOPHEN 650 MG RE SUPP: 650.0000 mg | RECTAL | Status: DC | PRN

## 2015-04-22 MED ORDER — ONDANSETRON HCL 4 MG/2ML IJ SOLN
INTRAMUSCULAR | Status: DC | PRN
  Administered 2015-04-22: 4 mg via INTRAVENOUS

## 2015-04-22 MED ORDER — BUPIVACAINE-EPINEPHRINE 0.25% -1:200000 IJ SOLN
INTRAMUSCULAR | Status: DC | PRN
Start: 2015-04-22 — End: 2015-04-22
  Administered 2015-04-22: 10 mL

## 2015-04-22 SURGICAL SUPPLY — 62 items
APL SKNCLS STERI-STRIP NONHPOA (GAUZE/BANDAGES/DRESSINGS) ×1
BENZOIN TINCTURE PRP APPL 2/3 (GAUZE/BANDAGES/DRESSINGS) ×3 IMPLANT
BIT DRILL SKYLINE 12MM (BIT) IMPLANT
BLADE SURG ROTATE 9660 (MISCELLANEOUS) IMPLANT
BONE CERV LORDOTIC 14.5X12X6 (Bone Implant) ×3 IMPLANT
BONE CERV LORDOTIC 14.5X12X7 (Bone Implant) ×3 IMPLANT
BUR ROUND FLUTED 4 SOFT TCH (BURR) ×1 IMPLANT
BUR ROUND FLUTED 4MM SOFT TCH (BURR) ×1
CLOSURE STERI-STRIP 1/2X4 (GAUZE/BANDAGES/DRESSINGS) ×1
CLOSURE WOUND 1/2 X4 (GAUZE/BANDAGES/DRESSINGS)
CLSR STERI-STRIP ANTIMIC 1/2X4 (GAUZE/BANDAGES/DRESSINGS) ×1 IMPLANT
COLLAR CERV LO CONTOUR FIRM DE (SOFTGOODS) ×2 IMPLANT
CORDS BIPOLAR (ELECTRODE) ×3 IMPLANT
COVER SURGICAL LIGHT HANDLE (MISCELLANEOUS) ×3 IMPLANT
CRADLE DONUT ADULT HEAD (MISCELLANEOUS) ×3 IMPLANT
DRAPE C-ARM 42X72 X-RAY (DRAPES) ×3 IMPLANT
DRAPE MICROSCOPE LEICA (MISCELLANEOUS) ×3 IMPLANT
DRAPE POUCH INSTRU U-SHP 10X18 (DRAPES) ×2 IMPLANT
DRAPE PROXIMA HALF (DRAPES) ×3 IMPLANT
DRILL BIT SKYLINE 12MM (BIT) ×3
DURAPREP 6ML APPLICATOR 50/CS (WOUND CARE) ×3 IMPLANT
ELECT COATED BLADE 2.86 ST (ELECTRODE) ×3 IMPLANT
ELECT REM PT RETURN 9FT ADLT (ELECTROSURGICAL) ×3
ELECTRODE REM PT RTRN 9FT ADLT (ELECTROSURGICAL) ×1 IMPLANT
EVACUATOR 1/8 PVC DRAIN (DRAIN) ×3 IMPLANT
GAUZE SPONGE 4X4 12PLY STRL (GAUZE/BANDAGES/DRESSINGS) ×1 IMPLANT
GAUZE XEROFORM 1X8 LF (GAUZE/BANDAGES/DRESSINGS) ×2 IMPLANT
GLOVE BIOGEL PI IND STRL 8 (GLOVE) ×2 IMPLANT
GLOVE BIOGEL PI INDICATOR 8 (GLOVE) ×4
GLOVE ORTHO TXT STRL SZ7.5 (GLOVE) ×8 IMPLANT
GOWN STRL REUS W/ TWL LRG LVL3 (GOWN DISPOSABLE) ×1 IMPLANT
GOWN STRL REUS W/ TWL XL LVL3 (GOWN DISPOSABLE) ×1 IMPLANT
GOWN STRL REUS W/TWL 2XL LVL3 (GOWN DISPOSABLE) ×3 IMPLANT
GOWN STRL REUS W/TWL LRG LVL3 (GOWN DISPOSABLE) ×3
GOWN STRL REUS W/TWL XL LVL3 (GOWN DISPOSABLE) ×3
GRAFT BNE SPCR VG2 14.5X12X6 (Bone Implant) IMPLANT
GRAFT BNE SPCR VG2 14.5X12X7 (Bone Implant) IMPLANT
HEAD HALTER (SOFTGOODS) ×3 IMPLANT
HEMOSTAT SURGICEL 2X14 (HEMOSTASIS) IMPLANT
KIT BASIN OR (CUSTOM PROCEDURE TRAY) ×3 IMPLANT
KIT ROOM TURNOVER OR (KITS) ×3 IMPLANT
MANIFOLD NEPTUNE II (INSTRUMENTS) IMPLANT
NDL 25GX 5/8IN NON SAFETY (NEEDLE) ×1 IMPLANT
NEEDLE 25GX 5/8IN NON SAFETY (NEEDLE) ×3 IMPLANT
NS IRRIG 1000ML POUR BTL (IV SOLUTION) ×3 IMPLANT
PACK ORTHO CERVICAL (CUSTOM PROCEDURE TRAY) ×3 IMPLANT
PAD ARMBOARD 7.5X6 YLW CONV (MISCELLANEOUS) ×6 IMPLANT
PATTIES SURGICAL .5 X.5 (GAUZE/BANDAGES/DRESSINGS) IMPLANT
PLATE SKYLINE TWO LEVEL 28MM (Plate) ×2 IMPLANT
RESTRAINT LIMB HOLDER UNIV (RESTRAINTS) IMPLANT
SCREW VARIABLE SELF TAP 12MM (Screw) ×12 IMPLANT
SPONGE GAUZE 4X4 12PLY STER LF (GAUZE/BANDAGES/DRESSINGS) ×2 IMPLANT
STRIP CLOSURE SKIN 1/2X4 (GAUZE/BANDAGES/DRESSINGS) ×1 IMPLANT
SURGIFLO W/THROMBIN 8M KIT (HEMOSTASIS) ×2 IMPLANT
SUT BONE WAX W31G (SUTURE) ×3 IMPLANT
SUT VIC AB 3-0 X1 27 (SUTURE) ×3 IMPLANT
SUT VICRYL 4-0 PS2 18IN ABS (SUTURE) ×4 IMPLANT
SYR 30ML SLIP (SYRINGE) ×3 IMPLANT
TAPE CLOTH SURG 4X10 WHT LF (GAUZE/BANDAGES/DRESSINGS) ×2 IMPLANT
TOWEL OR 17X24 6PK STRL BLUE (TOWEL DISPOSABLE) ×3 IMPLANT
TOWEL OR 17X26 10 PK STRL BLUE (TOWEL DISPOSABLE) ×3 IMPLANT
TRAY FOLEY CATH 16FR SILVER (SET/KITS/TRAYS/PACK) IMPLANT

## 2015-04-22 NOTE — Interval H&P Note (Signed)
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 After consideration of risks, benefits and other options for treatment, the patient has consented to  Procedure(s): C4-5, C5-6 Anterior Cervical Discectomy and Fusion, Allograft, Plate (N/A) as a surgical intervention .  The patient's history has been reviewed, patient examined, no change in status, stable for surgery.  I have reviewed the patient's chart and labs.  Questions were answered to the patient's satisfaction.     Mykaylah Ballman C

## 2015-04-22 NOTE — Anesthesia Preprocedure Evaluation (Signed)
Anesthesia Evaluation  Patient identified by MRN, date of birth, ID band Patient awake    Reviewed: Allergy & Precautions, NPO status , Patient's Chart, lab work & pertinent test results, reviewed documented beta blocker date and time   History of Anesthesia Complications Negative for: history of anesthetic complications  Airway Mallampati: II  TM Distance: >3 FB Neck ROM: Full    Dental  (+) Teeth Intact   Pulmonary neg shortness of breath, asthma , neg sleep apnea, COPD,  COPD inhaler, neg recent URI, Current Smoker, neg PE   breath sounds clear to auscultation       Cardiovascular hypertension, Pt. on medications and Pt. on home beta blockers (-) angina(-) Past MI and (-) CHF  Rhythm:Regular     Neuro/Psych neg Seizures PSYCHIATRIC DISORDERS Anxiety Depression Bipolar Disorder  Neuromuscular disease    GI/Hepatic GERD  Controlled,(+) Hepatitis -, C  Endo/Other  negative endocrine ROS  Renal/GU negative Renal ROS     Musculoskeletal  (+) Arthritis , Fibromyalgia -  Abdominal   Peds  Hematology   Anesthesia Other Findings   Reproductive/Obstetrics                             Anesthesia Physical Anesthesia Plan  ASA: III  Anesthesia Plan: General   Post-op Pain Management:    Induction: Intravenous  Airway Management Planned: Oral ETT  Additional Equipment: None  Intra-op Plan:   Post-operative Plan: Extubation in OR  Informed Consent: I have reviewed the patients History and Physical, chart, labs and discussed the procedure including the risks, benefits and alternatives for the proposed anesthesia with the patient or authorized representative who has indicated his/her understanding and acceptance.   Dental advisory given  Plan Discussed with: CRNA and Surgeon  Anesthesia Plan Comments:         Anesthesia Quick Evaluation

## 2015-04-22 NOTE — Anesthesia Postprocedure Evaluation (Signed)
Anesthesia Post Note  Patient: [name]  Procedure(s) Performed: Procedure(s) (LRB): Cervical 4-5, Cervical 5-6 Anterior Cervical Discectomy and Fusion, Allograft, Plate (N/A)  Patient location during evaluation: PACU Anesthesia Type: General Level of consciousness: awake and alert Pain management: pain level controlled Vital Signs Assessment: post-procedure vital signs reviewed and stable Respiratory status: spontaneous breathing, nonlabored ventilation, respiratory function stable and patient connected to nasal cannula oxygen Cardiovascular status: blood pressure returned to baseline and stable Postop Assessment: no signs of nausea or vomiting Anesthetic complications: no    Last Vitals:  Filed Vitals:   04/22/15 1020 04/22/15 1520  BP: 155/103   Pulse: 78   Temp: 36.6 C 37 C  Resp: 20     Last Pain:  Filed Vitals:   04/22/15 1533  PainSc: 9                  Mindy Gali A

## 2015-04-22 NOTE — Progress Notes (Signed)
Orthopedic Tech Progress Note Patient Details:  [name] 05-Aug-1975 [id number]  Ortho Devices Type of Ortho Device: Soft collar Ortho Device/Splint Location: soft collar at bedside Ortho Device/Splint Interventions: Casandra Doffing 04/22/2015, 11:18 PM

## 2015-04-22 NOTE — Brief Op Note (Signed)
04/22/2015  3:13 PM  PATIENT:  [name]  40 y.o. 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 Theodora Lalanne pa-c    ANESTHESIA:   general  EBL:  Total I/O In: 1400 [I.V.:1400] Out: -   BLOOD ADMINISTERED:none  DRAINS: hemovac    SPECIMEN:  No Specimen  DISPOSITION OF SPECIMEN:  N/A  COUNTS:  YES  TOURNIQUET:  * No tourniquets in log *  DICTATION: .Dragon Dictation  PLAN OF CARE: Admit for overnight observation  PATIENT DISPOSITION:  PACU - hemodynamically stable.

## 2015-04-22 NOTE — Anesthesia Procedure Notes (Signed)
Procedure Name: Intubation Date/Time: 04/22/2015 12:48 PM Performed by: Ferol Luz L Pre-anesthesia Checklist: Patient identified, Emergency Drugs available, Suction available, Patient being monitored and Timeout performed Patient Re-evaluated:Patient Re-evaluated prior to inductionOxygen Delivery Method: Circle system utilized Preoxygenation: Pre-oxygenation with 100% oxygen Intubation Type: IV induction Ventilation: Mask ventilation without difficulty Laryngoscope Size: Mac and 3 Grade View: Grade II Tube type: Oral Tube size: 7.5 mm Number of attempts: 1 Airway Equipment and Method: Stylet Placement Confirmation: ETT inserted through vocal cords under direct vision,  positive ETCO2 and breath sounds checked- equal and bilateral Secured at: 20 cm Tube secured with: Tape Dental Injury: Teeth and Oropharynx as per pre-operative assessment

## 2015-04-22 NOTE — Transfer of Care (Signed)
Immediate Anesthesia Transfer of Care Note  Patient: [name]  Procedure(s) Performed: Procedure(s): Cervical 4-5, Cervical 5-6 Anterior Cervical Discectomy and Fusion, Allograft, Plate (N/A)  Patient Location: PACU  Anesthesia Type:General  Level of Consciousness: awake, alert , oriented and patient cooperative  Airway & Oxygen Therapy: Patient Spontanous Breathing and Patient connected to nasal cannula oxygen  Post-op Assessment: Report given to RN, Post -op Vital signs reviewed and stable and Patient moving all extremities  Post vital signs: Reviewed and stable  Last Vitals:  Filed Vitals:   04/22/15 1020  BP: 155/103  Pulse: 78  Temp: 36.6 C  Resp: 20    Complications: No apparent anesthesia complications

## 2015-04-23 ENCOUNTER — Encounter (HOSPITAL_COMMUNITY): Payer: Self-pay | Admitting: Orthopaedic Surgery

## 2015-04-23 DIAGNOSIS — M47812 Spondylosis without myelopathy or radiculopathy, cervical region: Secondary | ICD-10-CM | POA: Diagnosis not present

## 2015-04-23 NOTE — Op Note (Signed)
[name], [name]               ACCOUNT NO.:  [id number]  MEDICAL RECORD NO.:  [id number]  LOCATION:  [id number]                        FACILITY:  [facility]  PHYSICIAN:  Sharad Vaneaton C. Ophelia Charter, M.D.    DATE OF BIRTH:  11-09-1975  DATE OF PROCEDURE:  04/22/2015 DATE OF DISCHARGE:                              OPERATIVE REPORT   PREOPERATIVE DIAGNOSIS:  Cervical spondylosis, C4-5, C5-6.  POSTOPERATIVE DIAGNOSIS:  Cervical spondylosis, C4-5, C5-6.  PROCEDURE:  C4-5 and C5-6 anterior cervical diskectomy and fusion, allograft and plate.  SURGEON:  Sumit Branham C. Ophelia Charter, M.D.  ASSISTANT:  Zonia Kief, PA-C, medically necessary and present for the entire procedure.  DESCRIPTION OF PROCEDURE:  After induction of general anesthesia, orotracheal intubation, head halter traction, arms tucked to the side, yellow pads, calf bumpers applied for DVT prophylaxis, preoperative Ancef prophylaxis, neck was prepped with DuraPrep.  A time-out procedure was completed.  While the DuraPrep was drying, the area was squared with towels, sterile skin marker, Betadine, Steri-Drape, sterile Mayo stand at the head, and thyroid sheets and drapes.  Incision was made at the midline extending to the left between C4-5 and C5-6 levels.  Platysma was divided in line with the fibers.  Blunt dissection down the longus colli above the omohyoid was performed and a palpable large spur at C5-6 was noted.  A needle was attempted to be placed, but due to overhanging spurs, it would not enter the needle stuck in C4-5 instead confirmed with the lateral C-arm that had been draped.  Diskectomy was performed at C5-6 first taking off the large spurs using combination of rongeur and bur.  Retractor was used to carefully protect the esophagus and trachea in the midline.  Cloward retractors were placed, self-retaining. Diskectomy was performed.  Spurs were removed.  There were overhanging spurs posteriorly.  No extruded fragments.  Combination of hard  and soft disk material which was removed, decompression of the dura completely. Trial sizer showed a 7 mm gave a good fit.  The graft was opened, marked at the midline, traction pulled by the CRNA, graft was countersunk 2 mm. Identical procedure repeated at C4-5.  At this level, a 6-mm graft was placed.  Plate was selected, applied, drilled, checked under C-arm for positioning, and then final screws placed total of 6 screws 12 mm.  This was a Synthes plate.  Small locking screwdriver was used to lock the additional screws in.  Irrigation and checking with C-arm, pulling the arms down to make sure all plate and screws were in good position. Plate was at the midline.  Hemovac was placed with in and out technique, 3-0 Vicryl in the platysma.  A 4-0 Vicryl subcuticular closure.  Tincture of benzoin, Steri-Strips, 4x4s, tape, and soft cervical collar were applied.  The patient tolerated the procedure well and was transferred to recovery room in stable condition.     Ashleen Demma C. Ophelia Charter, M.D.     MCY/MEDQ  D:  04/23/2015  T:  04/23/2015  Job:  [id number]

## 2015-04-23 NOTE — Progress Notes (Signed)
Pt discharge education and instructions completed with pt and spouse at bedside; both voices understanding and denies any questions. Pt IV removed; pt incision dsg changed; site clean, dry with steri-strips intact. No s/s infection, drainage or active bleeding. Pt dsg remain clean, dry and intact. Soft neck collar remains in place. Pt handed her prescriptions for percocet and robaxin. Pt medicated with pain med prior to discharge. Pt discharge home with spouse to transport her home. Pt transported off unit via wheelchair with belongings and spouse at side. Dionne Bucy RN

## 2015-05-06 NOTE — Discharge Summary (Signed)
Patient ID: TARRIN MENN MRN: [id number] DOB/AGE: 39-Apr-1977 39 y.o.  Admit date: 04/22/2015 Discharge date: 05/06/2015  Admission Diagnoses:  Active Problems:   Cervical fusion syndrome   Discharge Diagnoses:  Active Problems:   Cervical fusion syndrome  status post Procedure(s): Cervical 4-5, Cervical 5-6 Anterior Cervical Discectomy and Fusion, Allograft, Plate  Past Medical History  Diagnosis Date  . Fibromyalgia, primary   . Anxiety   . Depressed   . Nephrolithiasis   . UTI (lower urinary tract infection)   . Kidney stone   . Hepatitis C   . Fibromyalgia   . Carpal tunnel syndrome   . Dysrhythmia     'skipped beats every now and then"  . Hypertension     dx a couple of months ago  . Emphysema     does smoke, and has slowed down  . COPD (chronic obstructive pulmonary disease) (HCC)   . Asthma     last flareup was yr or so with bronchitis  . GERD (gastroesophageal reflux disease)     periodic indigestion  . Arthritis     Surgeries: Procedure(s): Cervical 4-5, Cervical 5-6 Anterior Cervical Discectomy and Fusion, Allograft, Plate on 1/32/4401   Consultants:    Discharged Condition: Improved  Hospital Course: NATISHA TRZCINSKI is an 40 y.o. female who was admitted 04/22/2015 for operative treatment of cervical stenosis/hnp. Patient failed conservative treatments (please see the history and physical for the specifics) and had severe unremitting pain that affects sleep, daily activities and work/hobbies. After pre-op clearance, the patient was taken to the operating room on 04/22/2015 and underwent  Procedure(s): Cervical 4-5, Cervical 5-6 Anterior Cervical Discectomy and Fusion, Allograft, Plate.    Patient was given perioperative antibiotics:  Anti-infectives    Start     Dose/Rate Route Frequency Ordered Stop   04/22/15 2000  ceFAZolin (ANCEF) IVPB 1 g/50 mL premix     1 g 100 mL/hr over 30 Minutes Intravenous 3 times per day 04/22/15 1804 04/23/15 0627    04/22/15 1200  ceFAZolin (ANCEF) IVPB 2 g/50 mL premix     2 g 100 mL/hr over 30 Minutes Intravenous To [facility] 04/21/15 1042 04/22/15 1300       Patient was given sequential compression devices and early ambulation to prevent DVT.   Patient benefited maximally from hospital stay and there were no complications. At the time of discharge, the patient was urinating/moving their bowels without difficulty, tolerating a regular diet, pain is controlled with oral pain medications and they have been cleared by PT/OT.   Recent vital signs: No data found.    Recent laboratory studies: No results for input(s): WBC, HGB, HCT, PLT, NA, K, CL, CO2, BUN, CREATININE, GLUCOSE, INR, CALCIUM in the last 72 hours.  Invalid input(s): PT, 2   Discharge Medications:     Medication List    STOP taking these medications        ibuprofen 400 MG tablet  Commonly known as:  ADVIL,MOTRIN      TAKE these medications        methocarbamol 500 MG tablet  Commonly known as:  ROBAXIN  Take 1 tablet (500 mg total) by mouth 4 (four) times daily.     metoprolol succinate 25 MG 24 hr tablet  Commonly known as:  TOPROL-XL  Take 1 tablet (25 mg total) by mouth daily.     oxyCODONE-acetaminophen 5-325 MG tablet  Commonly known as:  ROXICET  Take 1 tablet by mouth every  6 (six) hours as needed for severe pain.     traZODone 50 MG tablet  Commonly known as:  DESYREL  Take 50 mg by mouth at bedtime as needed for sleep.     venlafaxine XR 75 MG 24 hr capsule  Commonly known as:  EFFEXOR-XR  Take 225 mg by mouth every morning. Patient states she takes 3 tabs a day per patient        Diagnostic Studies: Dg Cervical Spine 2-3 Views  04/22/2015  CLINICAL DATA:  Cervical 4 through 6 ACDF. EXAM: DG C-ARM 61-120 MIN; CERVICAL SPINE - 2-3 VIEW fluoroscopic time 13 seconds COMPARISON:  MRI of cervical spine February 17, 2015 FINDINGS: Fluoroscopic images demonstrate anterior fusion of C4 through C6 with  anterior side plate fixated by horizontal screws without malalignment. IMPRESSION: Anterior fusion of C4 through C6 without malalignment. Electronically Signed   By: Sherian Rein M.D.   On: 04/22/2015 15:03   Dg C-arm 1-60 Min  04/22/2015  CLINICAL DATA:  Cervical 4 through 6 ACDF. EXAM: DG C-ARM 61-120 MIN; CERVICAL SPINE - 2-3 VIEW fluoroscopic time 13 seconds COMPARISON:  MRI of cervical spine February 17, 2015 FINDINGS: Fluoroscopic images demonstrate anterior fusion of C4 through C6 with anterior side plate fixated by horizontal screws without malalignment. IMPRESSION: Anterior fusion of C4 through C6 without malalignment. Electronically Signed   By: Sherian Rein M.D.   On: 04/22/2015 15:03        Discharge Instructions    Call MD / Call 911    Complete by:  As directed   If you experience chest pain or shortness of breath, CALL 911 and be transported to the hospital emergency room.  If you develope a fever above 101 F, pus (white drainage) or increased drainage or redness at the wound, or calf pain, call your surgeon's office.     Constipation Prevention    Complete by:  As directed   Drink plenty of fluids.  Prune juice may be helpful.  You may use a stool softener, such as Colace (over the counter) 100 mg twice a day.  Use MiraLax (over the counter) for constipation as needed.     Diet - low sodium heart healthy    Complete by:  As directed      Discharge instructions    Complete by:  As directed   Ok to shower 5 days postop.  Do not apply any creams or ointments to incision.  Do not remove steri-strips.  Can use 4x4 gauze and tape for dressing changes.  No aggressive activity. Cervical collar must be on at all times even when showering.  Do not bend or turn neck.     Driving restrictions    Complete by:  As directed   No driving     Increase activity slowly as tolerated    Complete by:  As directed      Lifting restrictions    Complete by:  As directed   No lifting            Follow-up Information    Schedule an appointment as soon as possible for a visit with Eldred Manges, MD.   Specialty:  Orthopedic Surgery   Why:  needs return office visit one week postop   Contact information:   [address] [place] [address] [phone]        Discharge Plan:  discharge to home  Disposition:     Signed: Naida Sleight  05/06/2015, 12:09 PM

## 2015-05-26 ENCOUNTER — Encounter: Payer: Self-pay | Admitting: Family Medicine

## 2015-05-26 ENCOUNTER — Ambulatory Visit (HOSPITAL_COMMUNITY)
Admission: RE | Admit: 2015-05-26 | Discharge: 2015-05-26 | Disposition: A | Discharge status: Home / Self Care | Payer: Medicaid Other | Source: Ambulatory Visit | Attending: Family Medicine | Admitting: Family Medicine

## 2015-05-26 ENCOUNTER — Ambulatory Visit (INDEPENDENT_AMBULATORY_CARE_PROVIDER_SITE_OTHER): Payer: Medicaid Other | Admitting: Family Medicine

## 2015-05-26 VITALS — BP 138/98 | HR 94 | Temp 98.0°F | Ht 66.0 in | Wt 154.0 lb

## 2015-05-26 DIAGNOSIS — R0781 Pleurodynia: Secondary | ICD-10-CM | POA: Diagnosis not present

## 2015-05-26 DIAGNOSIS — J4521 Mild intermittent asthma with (acute) exacerbation: Secondary | ICD-10-CM | POA: Diagnosis not present

## 2015-05-26 DIAGNOSIS — I10 Essential (primary) hypertension: Secondary | ICD-10-CM

## 2015-05-26 DIAGNOSIS — B182 Chronic viral hepatitis C: Secondary | ICD-10-CM

## 2015-05-26 DIAGNOSIS — M8448XA Pathological fracture, other site, initial encounter for fracture: Secondary | ICD-10-CM | POA: Insufficient documentation

## 2015-05-26 DIAGNOSIS — W19XXXA Unspecified fall, initial encounter: Secondary | ICD-10-CM | POA: Insufficient documentation

## 2015-05-26 DIAGNOSIS — S2231XA Fracture of one rib, right side, initial encounter for closed fracture: Secondary | ICD-10-CM | POA: Diagnosis not present

## 2015-05-26 DIAGNOSIS — R918 Other nonspecific abnormal finding of lung field: Secondary | ICD-10-CM | POA: Insufficient documentation

## 2015-05-26 DIAGNOSIS — J069 Acute upper respiratory infection, unspecified: Secondary | ICD-10-CM | POA: Diagnosis present

## 2015-05-26 MED ORDER — HYDROCODONE-ACETAMINOPHEN 5-325 MG PO TABS: 1.0000 | ORAL_TABLET | Freq: Four times a day (QID) | ORAL | Status: DC | PRN

## 2015-05-26 MED ORDER — HYDROCHLOROTHIAZIDE 12.5 MG PO TABS: 12.5000 mg | ORAL_TABLET | Freq: Every day | ORAL | Status: DC

## 2015-05-26 NOTE — Patient Instructions (Signed)
Referring to infectious disease doctors for hepatitis C. You'll get a call to schedule the appointment  Entered referral to Dr. Ophelia Charter Checking xray of ribs Prescription for norco  Start hydrochlorothiazide (HCTZ) 12.5mg  daily Return in 2 weeks for lab appointment and nurse blood pressure check  Be well, Dr. Pollie Meyer
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
## 2015-05-27 NOTE — Assessment & Plan Note (Signed)
Refer to ID to discuss treatment.

## 2015-05-27 NOTE — Assessment & Plan Note (Signed)
Elevated initially to 151/122, better on repeat at 138/98. Reports of elevated blood pressures recently. Will add HCTZ 12.5mg  daily. Follow up in 2 weeks for RN BP check and BMET. Refilled metoprolol today.

## 2015-06-22 ENCOUNTER — Other Ambulatory Visit: Payer: Self-pay | Admitting: Family Medicine

## 2015-06-22 DIAGNOSIS — I10 Essential (primary) hypertension: Secondary | ICD-10-CM

## 2015-06-23 ENCOUNTER — Telehealth: Payer: Self-pay | Admitting: Family Medicine

## 2015-06-23 NOTE — Telephone Encounter (Signed)
Patient asks refill for hydrochlorothiazide 12.5mg  and metroprolol succinate 25 mg ASAP.  Please, follow up with Patient.

## 2015-07-15 ENCOUNTER — Other Ambulatory Visit: Payer: Medicaid Other

## 2015-07-15 DIAGNOSIS — B182 Chronic viral hepatitis C: Secondary | ICD-10-CM

## 2015-07-15 LAB — IRON: IRON: 40 ug/dL (ref 40–190)

## 2015-07-16 LAB — HEPATITIS B SURFACE ANTIBODY,QUALITATIVE: HEP B S AB: NEGATIVE

## 2015-07-16 LAB — HIV ANTIBODY (ROUTINE TESTING W REFLEX): HIV 1&2 Ab, 4th Generation: NONREACTIVE

## 2015-07-16 LAB — HEPATITIS A ANTIBODY, TOTAL: Hep A Total Ab: NONREACTIVE

## 2015-07-16 LAB — ANA: Anti Nuclear Antibody(ANA): NEGATIVE

## 2015-07-16 LAB — HEPATITIS B CORE ANTIBODY, TOTAL: Hep B Core Total Ab: NONREACTIVE

## 2015-07-16 LAB — HEPATITIS B SURFACE ANTIGEN: Hepatitis B Surface Ag: NEGATIVE

## 2015-07-21 LAB — HCV RNA, QUANT REAL-TIME PCR W/REFLEX
HCV RNA, PCR, QN (Log): 5.62 LogIU/mL — ABNORMAL HIGH
HCV RNA, PCR, QN: 419000 [IU]/mL — AB

## 2015-07-21 LAB — HCV RNA,LIPA RFLX NS5A DRUG RESIST
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
## 2015-08-26 ENCOUNTER — Ambulatory Visit (INDEPENDENT_AMBULATORY_CARE_PROVIDER_SITE_OTHER): Payer: Medicaid Other | Admitting: Internal Medicine

## 2015-08-26 ENCOUNTER — Encounter: Payer: Self-pay | Admitting: Internal Medicine

## 2015-08-26 VITALS — BP 126/89 | HR 89 | Temp 98.6°F | Ht 66.0 in | Wt 161.0 lb

## 2015-08-26 DIAGNOSIS — J069 Acute upper respiratory infection, unspecified: Secondary | ICD-10-CM | POA: Diagnosis not present

## 2015-08-26 DIAGNOSIS — Z23 Encounter for immunization: Secondary | ICD-10-CM

## 2015-08-26 DIAGNOSIS — B182 Chronic viral hepatitis C: Secondary | ICD-10-CM

## 2015-08-26 DIAGNOSIS — J4521 Mild intermittent asthma with (acute) exacerbation: Secondary | ICD-10-CM | POA: Diagnosis not present

## 2015-08-26 MED ORDER — SOFOSBUVIR-VELPATASVIR 400-100 MG PO TABS: 1.0000 | ORAL_TABLET | Freq: Every day | ORAL | Status: DC

## 2015-08-26 NOTE — Patient Instructions (Signed)
Date 08/26/2015  Dear Ms Sunny SchleinWooten, As discussed in the ID Clinic, your hepatitis C therapy will include the following medications:          Epclusa (sofosbuvir 400 mg/velpatasvir 100 mg) tablet:           Take 1 tablet by mouth once daily   Please note that ALL MEDICATIONS WILL START ON THE SAME DATE for a total of 12 weeks. ---------------------------------------------------------------- Your HCV Treatment Start Date: TBA   Your HCV genotype:  3    Liver Fibrosis: TBD    ---------------------------------------------------------------- YOUR PHARMACY CONTACT:   [facility] [address] Phone: [phone] Hours: Monday to Friday 7:30 am to 6:00 pm   Please always contact your pharmacy at least 3-4 business days before you run out of medications to ensure your next month's medication is ready or 1 week prior to running out if you receive it by mail.  Remember, each prescription is for 28 days. ---------------------------------------------------------------- GENERAL NOTES REGARDING YOUR HEPATITIS C MEDICATION:  SOFOSBUVIR/VELPATASVIR (Epclusa): - can be taken with our without food  - Acid reducing agents such as H2 blockers (ie. Pepcid (famotidine), Zantac (ranitidine), Tagamet (cimetidine), Axid (nizatidine) and proton pump inhibitors (ie. Prilosec (omeprazole), Protonix (pantoprazole), Nexium (esomeprazole), or Aciphex (rabeprazole)) can decrease effectiveness of Epclusa. Do not take until you have discussed with a health care provider.    -Antacids that contain magnesium and/or aluminum hydroxide (ie. Milk of Magensia, Rolaids, Gaviscon, Maalox, Mylanta, an dArthritis Pain Formula)can reduce absorption of Epclusa, so take them at least 4 hours after Epclusa.  -Calcium carbonate (calcium supplements or antacids such as Tums, Caltrate, Os-Cal)needs to be taken at least 4 hours hours after Epclusa.  -St. John's wort  or any products that contain St. John's wort like some herbal supplements  Please inform the office prior to starting any of these medications.  - The common side effects associated with Epclusa include:      1. Fatigue      2. Headache      3. Nausea      4. Diarrhea      5. Insomnia  Please note that this only lists the most common side effects and is NOT a comprehensive list of the potential side effects of these medications. For more information, please review the drug information sheets that come with your medication package from the pharmacy.  ---------------------------------------------------------------- GENERAL HELPFUL HINTS ON HCV THERAPY: 1. Stay well-hydrated. 2. Notify the ID Clinic of any changes in your other over-the-counter/herbal or prescription medications. 3. If you miss a dose of your medication, take the missed dose as soon as you remember. Return to your regular time/dose schedule the next day.  4.  Do not stop taking your medications without first talking with your healthcare provider. 5.  You may take Tylenol (acetaminophen), as long as the dose is less than 2000 mg (OR no more than 4 tablets of the Tylenol Extra Strengths 500mg  tablet) in 24 hours. 6.  You will see our pharmacist-specialist within the first 2 weeks of starting your medication. 7.  You will need to obtain routine labs around week 4 and12 weeks after starting and then 3 to 6 months after finishing Epclusa.    Staci RighterOMER, ROBERT, MD  [facility] [address] [place], [place]  [id number] [phone]

## 2015-08-26 NOTE — Progress Notes (Signed)
[facility]   CC: consideration for treatment for chronic hepatitis C  HPI:  +[name] is a 40 y.o. female who presents for initial evaluation and management of chronic hepatitis C.  Patient tested positive last year. Hepatitis C-associated risk factors present are: tattoos (details: shared with ex husband who was positive for hepatitis C). Patient denies history of blood transfusion, intranasal drug use, IV drug abuse, multiple sexual partners, renal dialysis. Patient has had other studies performed. Results: hepatitis C RNA by PCR, result: positive. Patient has not had prior treatment for Hepatitis C. Patient does not have a past history of liver disease. Patient does have a family history of liver disease. Patient does not  have associated signs or symptoms related to liver disease.  Labs reviewed and confirm chronic hepatitis C with a positive viral load.  Records reviewed from PCP.   Has chronic pain.  Hopeful that treatment will help her feel better.      Patient does not have documented immunity to Hepatitis A. Patient does not have documented immunity to Hepatitis B.    Review of Systems:   Constitutional: positive for fatigue and malaise or negative for anorexia Gastrointestinal: negative for diarrhea Musculoskeletal: positive for myalgias, negative for arthralgias and muscle weakness All other systems reviewed and are negative      Past Medical History  Diagnosis Date  . Fibromyalgia, primary   . Anxiety   . Depressed   . Nephrolithiasis   . UTI (lower urinary tract infection)   . Kidney stone   . Hepatitis C   . Fibromyalgia   . Carpal tunnel syndrome   . Dysrhythmia     'skipped beats every now and then"  . Hypertension     dx a couple of months ago  . Emphysema     does smoke, and has slowed down  . COPD (chronic obstructive pulmonary disease) (HCC)   . Asthma     last flareup was yr or so with bronchitis  . GERD  (gastroesophageal reflux disease)     periodic indigestion  . Arthritis     Prior to Admission medications   Medication Sig Start Date End Date Taking? Authorizing Provider  hydrochlorothiazide (HYDRODIURIL) 12.5 MG tablet TAKE 1 TABLET DAILY 06/23/15  Yes Abram Sander, MD  metoprolol succinate (TOPROL-XL) 25 MG 24 hr tablet TAKE 1 TABLET (25 MG TOTAL) BY MOUTH DAILY. 06/23/15  Yes Abram Sander, MD  traZODone (DESYREL) 50 MG tablet Take 50 mg by mouth at bedtime as needed for sleep.   Yes Historical Provider, MD  venlafaxine XR (EFFEXOR-XR) 75 MG 24 hr capsule Take 225 mg by mouth every morning. Patient states she takes 3 tabs a day per patient 06/20/14  Yes Historical Provider, MD    Allergies  Allergen Reactions  . Sulfa Antibiotics Nausea And Vomiting, Swelling and Rash    Throat swells  . Eggs Or Egg-Derived Products Other (See Comments)    Bad pain  . Milk-Related Compounds Other (See Comments)    Bad pain    Social History  Substance Use Topics  . Smoking status: Current Every Day Smoker -- 0.40 packs/day for 10 years    Types: Cigarettes    Start date: 03/28/1990  . Smokeless tobacco: Never Used     Comment: cutting back  . Alcohol Use: No     Comment: on occasion    Family History  Problem Relation Age of Onset  .  Alcohol abuse Father   . Cirrhosis Father   . Cancer Father   . Hyperthyroidism Sister   . Bipolar disorder Sister   . Kidney disease Brother   . Heart attack Mother   . Hypertension Mother   Father with HCC   Objective:  Constitutional: in no apparent distress and alert,  Filed Vitals:   08/26/15 1339  BP: 126/89  Pulse: 89  Temp: 98.6 F (37 C)   Eyes: anicteric Cardiovascular: Cor RRR and No murmurs Respiratory: CTA B; normal respiratory effort Gastrointestinal: Bowel sounds are normal, liver is not enlarged, spleen is not enlarged Musculoskeletal: peripheral pulses normal, no pedal edema, no clubbing or cyanosis Skin: negative for -  jaundice, spider hemangioma, telangiectasia, palmar erythema, ecchymosis and atrophy; no porphyria cutanea tarda Lymphatic: no cervical lymphadenopathy   Laboratory Genotype:  Lab Results  Component Value Date   HCVGENOTYPE 3 02/28/2012   HCV viral load:  Lab Results  Component Value Date   HCVQUANT [id number]* 02/28/2012   Lab Results  Component Value Date   WBC 3.4* 04/21/2015   HGB 13.5 04/21/2015   HCT 40.9 04/21/2015   MCV 95.8 04/21/2015   PLT 297 04/21/2015    Lab Results  Component Value Date   CREATININE 0.63 04/21/2015   BUN 12 04/21/2015   NA 137 04/21/2015   K 4.4 04/21/2015   CL 101 04/21/2015   CO2 26 04/21/2015    Lab Results  Component Value Date   ALT 129* 04/21/2015   AST 89* 04/21/2015   ALKPHOS 115 04/21/2015     Labs and history reviewed and show CHILD-PUGH A  5-6 points: Child class A 7-9 points: Child class B 10-15 points: Child class C  Lab Results  Component Value Date   INR 0.87 04/21/2015   BILITOT 0.3 04/21/2015   ALBUMIN 4.0 04/21/2015     Assessment: New Patient with Chronic Hepatitis C genotype 3, untreated.  I discussed with the patient the lab findings that confirm chronic hepatitis C as well as the natural history and progression of disease including about 30% of people who develop cirrhosis of the liver if left untreated and once cirrhosis is established there is a 2-7% risk per year of liver cancer and liver failure.  I discussed the importance of treatment and benefits in reducing the risk, even if significant liver fibrosis exists.   Plan: 1) Patient counseled extensively on limiting acetaminophen to no more than 2 grams daily, avoidance of alcohol. 2) Transmission discussed with patient including sexual transmission, sharing razors and toothbrush.   3) Will need referral to gastroenterology if concern for cirrhosis 4) Will need referral for substance abuse counseling: No.; Further work up to include urine drug screen   No. 5) Will prescribe Epclusa for 12 weeks 6) Hepatitis A vaccine Yes.   7) Hepatitis B vaccine Yes.   8) Pneumovax vaccine if concern for cirrhosis 9) Further work up to include liver staging with elastography 10) will follow up after starting medication

## 2015-08-26 NOTE — Addendum Note (Signed)
Addended by: Gardiner BarefootOMER, Azariya Freeman W on: 08/26/2015 02:39 PM   Modules accepted: Orders

## 2015-08-26 NOTE — Addendum Note (Signed)
Addended by: Wendall MolaOCKERHAM, JACQUELINE A on: 08/26/2015 03:25 PM   Modules accepted: Orders

## 2015-09-08 ENCOUNTER — Telehealth: Payer: Self-pay | Admitting: *Deleted

## 2015-09-08 NOTE — Telephone Encounter (Signed)
Patient notified of appt for ultrasound on 10/27/15 at 8:45 AM. Nothing to eat or drink after midnight and will need a PA to be done after 09/26/15. [name]

## 2015-10-02 ENCOUNTER — Emergency Department (HOSPITAL_COMMUNITY)
Admission: EM | Admit: 2015-10-02 | Discharge: 2015-10-02 | Disposition: A | Discharge status: Home / Self Care | Payer: Medicaid Other | Attending: Emergency Medicine | Admitting: Emergency Medicine

## 2015-10-02 ENCOUNTER — Encounter (HOSPITAL_COMMUNITY): Payer: Self-pay | Admitting: Emergency Medicine

## 2015-10-02 DIAGNOSIS — J449 Chronic obstructive pulmonary disease, unspecified: Secondary | ICD-10-CM | POA: Insufficient documentation

## 2015-10-02 DIAGNOSIS — I10 Essential (primary) hypertension: Secondary | ICD-10-CM | POA: Diagnosis not present

## 2015-10-02 DIAGNOSIS — M542 Cervicalgia: Secondary | ICD-10-CM | POA: Diagnosis present

## 2015-10-02 DIAGNOSIS — M5412 Radiculopathy, cervical region: Secondary | ICD-10-CM | POA: Insufficient documentation

## 2015-10-02 DIAGNOSIS — F17210 Nicotine dependence, cigarettes, uncomplicated: Secondary | ICD-10-CM | POA: Diagnosis not present

## 2015-10-02 MED ORDER — PREDNISONE 50 MG PO TABS: 50.0000 mg | ORAL_TABLET | Freq: Every day | ORAL | Status: DC

## 2015-10-02 MED ORDER — CYCLOBENZAPRINE HCL 10 MG PO TABS: 10.0000 mg | ORAL_TABLET | Freq: Two times a day (BID) | ORAL | Status: DC | PRN

## 2015-10-02 MED ORDER — NAPROXEN 500 MG PO TABS: 500.0000 mg | ORAL_TABLET | Freq: Two times a day (BID) | ORAL | Status: DC

## 2015-10-02 NOTE — ED Notes (Signed)
Pt to ER by private vehicle with complaint of right neck and shoulder pain with numbness and tingling down right arm. Pt reports having to "raise my arm to get the pins and needles feeling to go away." pt has hx of spinal stenosis and degenerative disc disease which she has had surgery in the past for left arm/neck pain and numbness. Pt reports the pain is severe and she has not had any sleep the last 3-4 nights. A/o x4. NAD.

## 2015-10-02 NOTE — Discharge Instructions (Signed)

## 2015-10-02 NOTE — ED Provider Notes (Signed)
CSN: [id number]     Arrival date & time 10/02/15  0732 History   First MD [id number] Contact with Patient 10/02/15 [phone]     Chief Complaint  Patient presents with  . Shoulder Pain    HPI Patient presents to the emergency room with complaints of right shoulder and neck pain. The symptoms started about a month ago. Patient initially thought it might have just been a muscle spasm and it might go away. Pain however has persisted and gradually has increased in intensity. She has a history of cervical disc surgery that caused radiculopathy in the left upper extremity.  She had fusion surgery in January of this year. Patient states that her symptoms now feel like her prior radicular symptoms on the left but the symptoms are now on the right. The pain is sharp and it tingles. It radiates down her arm into her hand. She denies any weakness. No recent falls or injuries. Past Medical History  Diagnosis Date  . Fibromyalgia, primary   . Anxiety   . Depressed   . Nephrolithiasis   . UTI (lower urinary tract infection)   . Kidney stone   . Hepatitis C   . Fibromyalgia   . Carpal tunnel syndrome   . Dysrhythmia     'skipped beats every now and then"  . Hypertension     dx a couple of months ago  . Emphysema     does smoke, and has slowed down  . COPD (chronic obstructive pulmonary disease) (HCC)   . Asthma     last flareup was yr or so with bronchitis  . GERD (gastroesophageal reflux disease)     periodic indigestion  . Arthritis    Past Surgical History  Procedure Laterality Date  . Abdominal hysterectomy    . Orif ankle fracture  02/03/2012    Procedure: OPEN REDUCTION INTERNAL FIXATION (ORIF) ANKLE FRACTURE;  Surgeon: Nadara MustardMarcus V Duda, MD;  Location: [facility] OR;  Service: Orthopedics;  Laterality: Right;  Open Reduction Internal Fixation Right Ankle  . Tubal ligation    . Wisdom tooth extraction    . Orif clavicular fracture Left 08/29/2012    Dr [name]  . Orif clavicular fracture Left 08/29/2012     Procedure: OPEN REDUCTION INTERNAL FIXATION (ORIF) CLAVICULAR FRACTURE;  Surgeon: Eldred MangesMark C Yates, MD;  Location: [facility] OR;  Service: Orthopedics;  Laterality: Left;  Open Reduction Internal Fixation Left Clavicle Fracture  . Carpal tunnel release      right hand  . Anterior cervical decomp/discectomy fusion N/A 04/22/2015    Procedure: Cervical 4-5, Cervical 5-6 Anterior Cervical Discectomy and Fusion, Allograft, Plate;  Surgeon: Eldred MangesMark C Yates, MD;  Location: [facility] OR;  Service: Orthopedics;  Laterality: N/A;   Family History  Problem Relation Age of Onset  . Alcohol abuse Father   . Cirrhosis Father   . Cancer Father   . Hyperthyroidism Sister   . Bipolar disorder Sister   . Kidney disease Brother   . Heart attack Mother   . Hypertension Mother    Social History  Substance Use Topics  . Smoking status: Current Every Day Smoker -- 0.40 packs/day for 10 years    Types: Cigarettes    Start date: 03/28/1990  . Smokeless tobacco: Never Used     Comment: cutting back  . Alcohol Use: No     Comment: on occasion   OB History    Gravida Para Term Preterm AB TAB SAB Ectopic Multiple Living  4      Obstetric Comments   S/p hysterectomy     Review of Systems  All other systems reviewed and are negative.     Allergies  Sulfa antibiotics; Eggs or egg-derived products; and Milk-related compounds  Home Medications   Prior to Admission medications   Medication Sig Start Date End Date Taking? Authorizing Provider  cyclobenzaprine (FLEXERIL) 10 MG tablet Take 1 tablet (10 mg total) by mouth 2 (two) times daily as needed for muscle spasms. 10/02/15   Linwood DibblesJon Sonya Pucci, MD  hydrochlorothiazide (HYDRODIURIL) 12.5 MG tablet TAKE 1 TABLET DAILY 06/23/15   Abram SanderElena M Adamo, MD  metoprolol succinate (TOPROL-XL) 25 MG 24 hr tablet TAKE 1 TABLET (25 MG TOTAL) BY MOUTH DAILY. 06/23/15   Abram SanderElena M Adamo, MD  naproxen (NAPROSYN) 500 MG tablet Take 1 tablet (500 mg total) by mouth 2 (two) times daily with a meal.  As needed for pain 10/02/15   Linwood DibblesJon Marvelyn Bouchillon, MD  predniSONE (DELTASONE) 50 MG tablet Take 1 tablet (50 mg total) by mouth daily. 10/02/15   Linwood DibblesJon Dallin Mccorkel, MD  Sofosbuvir-Velpatasvir (EPCLUSA) 400-100 MG TABS Take 1 tablet by mouth daily. 08/26/15   Gardiner Barefootobert W Comer, MD  traZODone (DESYREL) 50 MG tablet Take 50 mg by mouth at bedtime as needed for sleep.    Historical Provider, MD  venlafaxine XR (EFFEXOR-XR) 75 MG 24 hr capsule Take 225 mg by mouth every morning. Patient states she takes 3 tabs a day per patient 06/20/14   Historical Provider, MD   BP 154/104 mmHg  Pulse 98  Temp(Src) 98.1 F (36.7 C) (Oral)  Resp 18  SpO2 100% Physical Exam  Constitutional: She appears well-developed and well-nourished. No distress.  HENT:  Head: Normocephalic and atraumatic.  Right Ear: External ear normal.  Left Ear: External ear normal.  Eyes: Conjunctivae are normal. Right eye exhibits no discharge. Left eye exhibits no discharge. No scleral icterus.  Neck: Neck supple. No tracheal deviation present.  Cardiovascular: Normal rate.   Pulmonary/Chest: Effort normal. No stridor. No respiratory distress.  Musculoskeletal: She exhibits no edema.       Right shoulder: Normal. She exhibits no tenderness and no bony tenderness.       Cervical back: Normal. She exhibits no tenderness and no bony tenderness.  Neurological: She is alert. Cranial nerve deficit: no gross deficits.  Normal grip strength bilaterally, normal sensation bilaterally  Skin: Skin is warm and dry. No rash noted.  Psychiatric: She has a normal mood and affect.  Nursing note and vitals reviewed.   ED Course  Procedures   MDM   Final diagnoses:  Cervical radicular pain    Consistent with cervical radiculopathy.  No neuro deficits on exam.  Will dc with steroids and pain meds.  Follow up with her surgeon or PCP for further evaluation.  Consider outpatient MRI    Linwood DibblesJon Wyeth Hoffer, MD 10/02/15 [phone]

## 2015-10-06 ENCOUNTER — Emergency Department (HOSPITAL_COMMUNITY)
Admission: EM | Admit: 2015-10-06 | Discharge: 2015-10-06 | Disposition: A | Discharge status: Home / Self Care | Payer: Medicaid Other | Attending: Emergency Medicine | Admitting: Emergency Medicine

## 2015-10-06 ENCOUNTER — Encounter (HOSPITAL_COMMUNITY): Payer: Self-pay | Admitting: *Deleted

## 2015-10-06 DIAGNOSIS — F17210 Nicotine dependence, cigarettes, uncomplicated: Secondary | ICD-10-CM | POA: Insufficient documentation

## 2015-10-06 DIAGNOSIS — J449 Chronic obstructive pulmonary disease, unspecified: Secondary | ICD-10-CM | POA: Diagnosis not present

## 2015-10-06 DIAGNOSIS — M5412 Radiculopathy, cervical region: Secondary | ICD-10-CM | POA: Diagnosis not present

## 2015-10-06 DIAGNOSIS — Z79899 Other long term (current) drug therapy: Secondary | ICD-10-CM | POA: Insufficient documentation

## 2015-10-06 DIAGNOSIS — I10 Essential (primary) hypertension: Secondary | ICD-10-CM | POA: Insufficient documentation

## 2015-10-06 DIAGNOSIS — M542 Cervicalgia: Secondary | ICD-10-CM | POA: Diagnosis present

## 2015-10-06 MED ORDER — OXYCODONE-ACETAMINOPHEN 5-325 MG PO TABS
1.0000 | ORAL_TABLET | ORAL | Status: DC | PRN
  Administered 2015-10-06: 1 via ORAL

## 2015-10-06 MED ORDER — OXYCODONE-ACETAMINOPHEN 5-325 MG PO TABS
ORAL_TABLET | ORAL | Status: AC
  Filled 2015-10-06: qty 1

## 2015-10-06 MED ORDER — OXYCODONE-ACETAMINOPHEN 5-325 MG PO TABS: 1.0000 | ORAL_TABLET | ORAL | Status: DC | PRN

## 2015-10-06 NOTE — Discharge Instructions (Signed)

## 2015-10-06 NOTE — ED Notes (Signed)
Patient states she has had severe neck pain for 1 month, has been seen for same on Friday and had follow up with practice that did surgery and said there is a screw that has become loose. Patient states she was not given any prescriptions for pain medication but states that she does need MRI. Patient is taking steroids that were prescribed here. Patient states that pain is 10 times worse then on Friday. Patient only has prescription for neurotin, states that she isn't sleeping and is unable to get comfortable.

## 2015-10-06 NOTE — ED Provider Notes (Signed)
CSN: [id number]     Arrival date & time 10/06/15  1941 History  By signing my name below, I, [name], attest that this documentation has been prepared under the direction and in the presence of Bethel Born, PA-C Electronically Signed: Soijett Christensen, ED Scribe. 10/06/2015. 11:12 PM.   Chief Complaint  Patient presents with  . Neck Pain    The history is provided by the patient. No language interpreter was used.   HPI Comments: [name] is a 40 y.o. female who presents to the Emergency Department complaining of throbbing neck pain onset 1 month. Pt notes that she was seen in the ED recently for her symptoms and informed to follow up with her surgeon. Pt states that she followed up with Dr. Mayer Camel, who stated that a screw came loose from her prior surgery site. Pt notes that Dr. Ophelia Charter is her surgeon with [facility]. Pt reports that she was not Rx any medications during that visit. Pt notes that she can "hear the screw moving" in her neck. Pt initial neck surgery was 6 months ago with a creaking sensation x 4 months. She states that she is having associated symptoms of right shoulder pain with heavy lifting, right arm numbness to right wrist, and intermittent paresthesia to right arm. She states that she has tried Rx gabapentin, Rx prednisone, with no relief for her symptoms. She denies back pain and any other symptoms.  Per pt chart review: Pt was seen in the ED on 10/02/2015 for shoulder and neck pain. Pt was Rx naprosyn, flexeril, and prednisone, for her symptoms. Pt was informed to follow up with her surgeon for further evaluation.    Past Medical History  Diagnosis Date  . Fibromyalgia, primary   . Anxiety   . Depressed   . Nephrolithiasis   . UTI (lower urinary tract infection)   . Kidney stone   . Hepatitis C   . Fibromyalgia   . Carpal tunnel syndrome   . Dysrhythmia     'skipped beats every now and then"  . Hypertension     dx a couple of months ago  .  Emphysema     does smoke, and has slowed down  . COPD (chronic obstructive pulmonary disease) (HCC)   . Asthma     last flareup was yr or so with bronchitis  . GERD (gastroesophageal reflux disease)     periodic indigestion  . Arthritis    Past Surgical History  Procedure Laterality Date  . Abdominal hysterectomy    . Orif ankle fracture  02/03/2012    Procedure: OPEN REDUCTION INTERNAL FIXATION (ORIF) ANKLE FRACTURE;  Surgeon: Nadara Mustard, MD;  Location: [facility] OR;  Service: Orthopedics;  Laterality: Right;  Open Reduction Internal Fixation Right Ankle  . Tubal ligation    . Wisdom tooth extraction    . Orif clavicular fracture Left 08/29/2012    Dr Ophelia Charter  . Orif clavicular fracture Left 08/29/2012    Procedure: OPEN REDUCTION INTERNAL FIXATION (ORIF) CLAVICULAR FRACTURE;  Surgeon: Eldred Manges, MD;  Location: [facility] OR;  Service: Orthopedics;  Laterality: Left;  Open Reduction Internal Fixation Left Clavicle Fracture  . Carpal tunnel release      right hand  . Anterior cervical decomp/discectomy fusion N/A 04/22/2015    Procedure: Cervical 4-5, Cervical 5-6 Anterior Cervical Discectomy and Fusion, Allograft, Plate;  Surgeon: Eldred Manges, MD;  Location: [facility] OR;  Service: Orthopedics;  Laterality: N/A;   Family History  Problem Relation Age of Onset  . Alcohol abuse Father   . Cirrhosis Father   . Cancer Father   . Hyperthyroidism Sister   . Bipolar disorder Sister   . Kidney disease Brother   . Heart attack Mother   . Hypertension Mother    Social History  Substance Use Topics  . Smoking status: Current Every Day Smoker -- 0.40 packs/day for 10 years    Types: Cigarettes    Start date: 03/28/1990  . Smokeless tobacco: Never Used     Comment: cutting back  . Alcohol Use: No     Comment: on occasion   OB History    Gravida Para Term Preterm AB TAB SAB Ectopic Multiple Living            4      Obstetric Comments   S/p hysterectomy     Review of Systems  Musculoskeletal:  Positive for arthralgias (right shoulder) and neck pain.  Skin: Negative for color change, rash and wound.  Neurological: Positive for numbness (right arm). Negative for weakness.       Paresthesia to right arm    Allergies  Sulfa antibiotics; Eggs or egg-derived products; and Milk-related compounds  Home Medications   Prior to Admission medications   Medication Sig Start Date End Date Taking? Authorizing Provider  cyclobenzaprine (FLEXERIL) 10 MG tablet Take 1 tablet (10 mg total) by mouth 2 (two) times daily as needed for muscle spasms. 10/02/15   Linwood Dibbles, MD  hydrochlorothiazide (HYDRODIURIL) 12.5 MG tablet TAKE 1 TABLET DAILY 06/23/15   Abram Sander, MD  metoprolol succinate (TOPROL-XL) 25 MG 24 hr tablet TAKE 1 TABLET (25 MG TOTAL) BY MOUTH DAILY. 06/23/15   Abram Sander, MD  naproxen (NAPROSYN) 500 MG tablet Take 1 tablet (500 mg total) by mouth 2 (two) times daily with a meal. As needed for pain 10/02/15   Linwood Dibbles, MD  predniSONE (DELTASONE) 50 MG tablet Take 1 tablet (50 mg total) by mouth daily. 10/02/15   Linwood Dibbles, MD  Sofosbuvir-Velpatasvir (EPCLUSA) 400-100 MG TABS Take 1 tablet by mouth daily. 08/26/15   Gardiner Barefoot, MD  traZODone (DESYREL) 50 MG tablet Take 50 mg by mouth at bedtime as needed for sleep.    Historical Provider, MD  venlafaxine XR (EFFEXOR-XR) 75 MG 24 hr capsule Take 225 mg by mouth every morning. Patient states she takes 3 tabs a day per patient 06/20/14   Historical Provider, MD   BP 130/98 mmHg  Pulse 92  Temp(Src) 98.2 F (36.8 C) (Oral)  Resp 18  SpO2 98%   Physical Exam  Constitutional: She is oriented to person, place, and time. She appears well-developed and well-nourished. No distress.  HENT:  Head: Normocephalic and atraumatic.  Eyes: EOM are normal.  Neck: Normal range of motion. Neck supple.  Cardiovascular: Normal rate.   Pulmonary/Chest: Effort normal. No respiratory distress.  Abdominal: She exhibits no distension.  Musculoskeletal:  Normal range of motion.       Cervical back: She exhibits tenderness and bony tenderness. She exhibits normal range of motion.  No obvious deformity. Well healed anterior surgical scars. Mild midline cervical tenderness with paraspinal muscle tenderness. FROM. Tenderness of trapezius muscles bilaterally. Equal strength in bilateral arms with strong equal bilateral strength. Equal sensation bilaterally in both arms.  Neurological: She is alert and oriented to person, place, and time.  Skin: Skin is warm and dry.  Psychiatric: She has a normal mood and affect. Her  behavior is normal.  Nursing note and vitals reviewed.   ED Course  Procedures (including critical care time) DIAGNOSTIC STUDIES: Oxygen Saturation is 98% on RA, nl by my interpretation.    COORDINATION OF CARE: 11:10 PM Discussed treatment plan with pt at bedside which includes percocet and follow up with surgeon and pt agreed to plan.     MDM   Final diagnoses:  Cervical radicular pain   40 year old female with chronic cervical radicular pain. No weakness or paresthesias today. She is moving all her extremities well and has FROM of neck. Discussed with patient that I do not see the need for an emergent MRI today but she will most likely need an outpatient MRI. Patient verbalized understanding. Pain medication given here in the ED with some relief. Will d/c with pain meds and advised follow up with surgeon. Patient verbalized understanding. Patient is NAD, non-toxic, with stable VS. Patient is informed of clinical course, understands medical decision making process, and agrees with plan. Opportunity for questions provided and all questions answered. Return precautions given.   I personally performed the services described in this documentation, which was scribed in my presence. The recorded information has been reviewed and is accurate.    Bethel BornKelly Marie Gekas, PA-C 10/07/15 [id number]  Marily MemosJason Mesner, MD 10/10/15 [phone]

## 2015-10-13 ENCOUNTER — Other Ambulatory Visit: Payer: Self-pay | Admitting: Sports Medicine

## 2015-10-13 DIAGNOSIS — M542 Cervicalgia: Secondary | ICD-10-CM

## 2015-10-13 DIAGNOSIS — M5382 Other specified dorsopathies, cervical region: Secondary | ICD-10-CM

## 2015-10-16 ENCOUNTER — Ambulatory Visit
Admission: RE | Admit: 2015-10-16 | Discharge: 2015-10-16 | Disposition: A | Discharge status: Home / Self Care | Payer: Medicaid Other | Source: Ambulatory Visit | Attending: Sports Medicine | Admitting: Sports Medicine

## 2015-10-16 DIAGNOSIS — M5382 Other specified dorsopathies, cervical region: Secondary | ICD-10-CM

## 2015-10-16 DIAGNOSIS — M542 Cervicalgia: Secondary | ICD-10-CM

## 2015-10-24 ENCOUNTER — Encounter (HOSPITAL_COMMUNITY): Payer: Self-pay | Admitting: Emergency Medicine

## 2015-10-24 ENCOUNTER — Emergency Department (HOSPITAL_COMMUNITY)
Admission: EM | Admit: 2015-10-24 | Discharge: 2015-10-25 | Disposition: A | Discharge status: Home / Self Care | Payer: Medicaid Other | Attending: Emergency Medicine | Admitting: Emergency Medicine

## 2015-10-24 DIAGNOSIS — I10 Essential (primary) hypertension: Secondary | ICD-10-CM | POA: Insufficient documentation

## 2015-10-24 DIAGNOSIS — J449 Chronic obstructive pulmonary disease, unspecified: Secondary | ICD-10-CM | POA: Diagnosis not present

## 2015-10-24 DIAGNOSIS — F10920 Alcohol use, unspecified with intoxication, uncomplicated: Secondary | ICD-10-CM

## 2015-10-24 DIAGNOSIS — F17210 Nicotine dependence, cigarettes, uncomplicated: Secondary | ICD-10-CM | POA: Insufficient documentation

## 2015-10-24 DIAGNOSIS — Z79899 Other long term (current) drug therapy: Secondary | ICD-10-CM | POA: Diagnosis not present

## 2015-10-24 DIAGNOSIS — F10120 Alcohol abuse with intoxication, uncomplicated: Secondary | ICD-10-CM | POA: Diagnosis not present

## 2015-10-24 LAB — URINALYSIS, ROUTINE W REFLEX MICROSCOPIC
Bilirubin Urine: NEGATIVE
GLUCOSE, UA: NEGATIVE mg/dL
Hgb urine dipstick: NEGATIVE
Ketones, ur: NEGATIVE mg/dL
LEUKOCYTES UA: NEGATIVE
Nitrite: NEGATIVE
PH: 5 (ref 5.0–8.0)
Protein, ur: NEGATIVE mg/dL
Specific Gravity, Urine: 1.014 (ref 1.005–1.030)

## 2015-10-24 LAB — COMPREHENSIVE METABOLIC PANEL
ALT: 95 U/L — ABNORMAL HIGH (ref 14–54)
ANION GAP: 11 (ref 5–15)
AST: 74 U/L — AB (ref 15–41)
Albumin: 4.4 g/dL (ref 3.5–5.0)
Alkaline Phosphatase: 99 U/L (ref 38–126)
BUN: 19 mg/dL (ref 6–20)
CHLORIDE: 107 mmol/L (ref 101–111)
CO2: 22 mmol/L (ref 22–32)
Calcium: 9.2 mg/dL (ref 8.9–10.3)
Creatinine, Ser: 0.74 mg/dL (ref 0.44–1.00)
Glucose, Bld: 115 mg/dL — ABNORMAL HIGH (ref 65–99)
POTASSIUM: 4 mmol/L (ref 3.5–5.1)
Sodium: 140 mmol/L (ref 135–145)
Total Bilirubin: 0.7 mg/dL (ref 0.3–1.2)
Total Protein: 8 g/dL (ref 6.5–8.1)

## 2015-10-24 LAB — RAPID URINE DRUG SCREEN, HOSP PERFORMED
Amphetamines: NOT DETECTED
Barbiturates: NOT DETECTED
Benzodiazepines: POSITIVE — AB
COCAINE: NOT DETECTED
OPIATES: NOT DETECTED
Tetrahydrocannabinol: NOT DETECTED

## 2015-10-24 LAB — CBC WITH DIFFERENTIAL/PLATELET
Basophils Absolute: 0 10*3/uL (ref 0.0–0.1)
Basophils Relative: 0 %
Eosinophils Absolute: 0 10*3/uL (ref 0.0–0.7)
Eosinophils Relative: 0 %
HEMATOCRIT: 37.7 % (ref 36.0–46.0)
HEMOGLOBIN: 13.4 g/dL (ref 12.0–15.0)
LYMPHS ABS: 2.3 10*3/uL (ref 0.7–4.0)
Lymphocytes Relative: 32 %
MCH: 32 pg (ref 26.0–34.0)
MCHC: 35.5 g/dL (ref 30.0–36.0)
MCV: 90 fL (ref 78.0–100.0)
MONOS PCT: 5 %
Monocytes Absolute: 0.4 10*3/uL (ref 0.1–1.0)
NEUTROS ABS: 4.4 10*3/uL (ref 1.7–7.7)
NEUTROS PCT: 63 %
Platelets: 365 10*3/uL (ref 150–400)
RBC: 4.19 MIL/uL (ref 3.87–5.11)
RDW: 14.1 % (ref 11.5–15.5)
WBC: 7 10*3/uL (ref 4.0–10.5)

## 2015-10-24 LAB — ACETAMINOPHEN LEVEL

## 2015-10-24 LAB — POC URINE PREG, ED: Preg Test, Ur: NEGATIVE

## 2015-10-24 LAB — ETHANOL: ALCOHOL ETHYL (B): 338 mg/dL — AB (ref <5)

## 2015-10-24 LAB — SALICYLATE LEVEL

## 2015-10-24 LAB — HCG, QUANTITATIVE, PREGNANCY: hCG, Beta Chain, Quant, S: 1 m[IU]/mL (ref <5)
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
## 2015-10-24 NOTE — ED Notes (Signed)
Took report from lab critical alcohol of 338 taken over phone and reported to the nurse

## 2015-10-25 MED ORDER — NAPROXEN 500 MG PO TABS
500.0000 mg | ORAL_TABLET | Freq: Once | ORAL | Status: AC
  Administered 2015-10-25: 500 mg via ORAL
  Filled 2015-10-25: qty 1

## 2015-10-25 NOTE — ED Notes (Signed)
Pt alert and oriented x4 at this time no acute distress. Pt attempting to contact family for ride home

## 2015-10-25 NOTE — ED Notes (Signed)
Pt reports understanding of discharge information. No questions at time of discharge. Pt to be transported home by mother.

## 2015-10-26 ENCOUNTER — Telehealth: Payer: Self-pay

## 2015-10-26 LAB — URINE CULTURE: Culture: <10000 — AB

## 2015-10-26 NOTE — Telephone Encounter (Signed)
Faxed clinical documentation to [facility] to prove the need for elastography. LPN called and cancelled elastography appointment due to prior authorization not being done prior to appointment. Will need to be rescheduled once approval is obtained. Rejeana Brock, LPN  Attempted to contact patient at home number on file. No answer or voicemail. Called mobile number in chart and reached contact Conley Rolls that stated that he will relay message to patient that appointment was cancelled, and the office will call back once approval is given by insurance for elastography. Ivin Booty stated understanding. Rejeana Brock, LPN

## 2015-10-27 ENCOUNTER — Ambulatory Visit (HOSPITAL_COMMUNITY): Payer: Medicaid Other

## 2015-10-27 ENCOUNTER — Telehealth: Payer: Self-pay | Admitting: *Deleted

## 2015-10-27 NOTE — Telephone Encounter (Signed)
Called patient and notified of appt for ultrasound at [facility] on 11/24/15 at 9:45 AM. Nothing to eat or drink after midnight. Authorization # [id number]

## 2015-11-02 ENCOUNTER — Emergency Department (HOSPITAL_COMMUNITY)
Admission: EM | Admit: 2015-11-02 | Discharge: 2015-11-02 | Disposition: A | Discharge status: Home / Self Care | Payer: Medicaid Other | Attending: Emergency Medicine | Admitting: Emergency Medicine

## 2015-11-02 ENCOUNTER — Emergency Department (HOSPITAL_COMMUNITY): Payer: Medicaid Other

## 2015-11-02 ENCOUNTER — Encounter (HOSPITAL_COMMUNITY): Payer: Self-pay | Admitting: Vascular Surgery

## 2015-11-02 DIAGNOSIS — S76911A Strain of unspecified muscles, fascia and tendons at thigh level, right thigh, initial encounter: Secondary | ICD-10-CM | POA: Diagnosis not present

## 2015-11-02 DIAGNOSIS — W010XXA Fall on same level from slipping, tripping and stumbling without subsequent striking against object, initial encounter: Secondary | ICD-10-CM | POA: Diagnosis not present

## 2015-11-02 DIAGNOSIS — J449 Chronic obstructive pulmonary disease, unspecified: Secondary | ICD-10-CM | POA: Insufficient documentation

## 2015-11-02 DIAGNOSIS — F17210 Nicotine dependence, cigarettes, uncomplicated: Secondary | ICD-10-CM | POA: Diagnosis not present

## 2015-11-02 DIAGNOSIS — Y999 Unspecified external cause status: Secondary | ICD-10-CM | POA: Insufficient documentation

## 2015-11-02 DIAGNOSIS — Y939 Activity, unspecified: Secondary | ICD-10-CM | POA: Diagnosis not present

## 2015-11-02 DIAGNOSIS — S79921A Unspecified injury of right thigh, initial encounter: Secondary | ICD-10-CM | POA: Diagnosis present

## 2015-11-02 DIAGNOSIS — I10 Essential (primary) hypertension: Secondary | ICD-10-CM | POA: Insufficient documentation

## 2015-11-02 DIAGNOSIS — Y9289 Other specified places as the place of occurrence of the external cause: Secondary | ICD-10-CM | POA: Insufficient documentation

## 2015-11-02 MED ORDER — CYCLOBENZAPRINE HCL 10 MG PO TABS: 10.0000 mg | ORAL_TABLET | Freq: Two times a day (BID) | ORAL | 0 refills | Status: DC | PRN

## 2015-11-02 MED ORDER — IBUPROFEN 800 MG PO TABS: 800.0000 mg | ORAL_TABLET | Freq: Three times a day (TID) | ORAL | 0 refills | Status: DC

## 2015-11-02 NOTE — ED Triage Notes (Signed)
Pt reports to the ED for eval of right leg pain since a fall on Saturday. She reports pain starts in her hip and goes down to her knee. Pt states weight bearing/certain positions make the pain worse. Pt A&Ox4, resp e/u, and skin warm and dry.

## 2015-11-02 NOTE — ED Provider Notes (Signed)
[facility]-EMERGENCY DEPT Provider Note   CSN: [id number] Arrival date & time: 11/02/15  1201  First Provider Contact:   First MD Initiated Contact with Patient 11/02/15 1439     By signing my name below, I, [name], attest that this documentation has been prepared under the direction and in the presence of non-physician practitioner, Fayrene Helper, PA-C. Electronically Signed: Freida Christensen, Scribe. 11/02/2015. 2:53 PM.   History   Chief Complaint Chief Complaint  Patient presents with  . Leg Pain    The history is provided by the patient. No language interpreter was used.   HPI Comments:  [name] is a 40 y.o. female who presents to the Emergency Department complaining of moderate, gradually worsening pain to medial and posterior right thigh s/p fall 3 days ago. Pt states she slipped on wet ground and fell with her leg out to the side; she landed on her butt. She denies LOC and head injury. She reports exacerbation of pain when she bears weight on the extremity .  She has taken 3 Ibuprofen today, Neurontin and good powder without relief. She also denies ankle pain, knee pain, acute back or neck pain.   Past Medical History:  Diagnosis Date  . Anxiety   . Arthritis   . Asthma    last flareup was yr or so with bronchitis  . Carpal tunnel syndrome   . COPD (chronic obstructive pulmonary disease) (HCC)   . Depressed   . Dysrhythmia    'skipped beats every now and then"  . Emphysema    does smoke, and has slowed down  . Fibromyalgia   . Fibromyalgia, primary   . GERD (gastroesophageal reflux disease)    periodic indigestion  . Hepatitis C   . Hypertension    dx a couple of months ago  . Kidney stone   . Nephrolithiasis   . UTI (lower urinary tract infection)     Patient Active Problem List   Diagnosis Date Noted  . 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 Abdominal pain, epigastric 12/10/2014  . Tachycardia 11/17/2014  . HTN (hypertension) 11/17/2014  . Radial nerve  palsy 10/22/2013  . Facial droop 10/22/2013  . Fracture of clavicle, left, closed 08/30/2012  . Left shoulder pain 06/18/2012  . Bipolar disorder (HCC) 03/24/2012  . Nausea 03/23/2012  . Chronic hepatitis C without hepatic coma (HCC) 02/21/2012  . Elevated liver enzymes 02/20/2012  . Asthma 02/20/2012  . Right knee pain 05/17/2011  . Emphysema     Past Surgical History:  Procedure Laterality Date  . ABDOMINAL HYSTERECTOMY    . ANTERIOR CERVICAL DECOMP/DISCECTOMY FUSION N/A 04/22/2015   Procedure: Cervical 4-5, Cervical 5-6 Anterior Cervical Discectomy and Fusion, Allograft, Plate;  Surgeon: Eldred Manges, MD;  Location: [facility] OR;  Service: Orthopedics;  Laterality: N/A;  . CARPAL TUNNEL RELEASE     right hand  . ORIF ANKLE FRACTURE  02/03/2012   Procedure: OPEN REDUCTION INTERNAL FIXATION (ORIF) ANKLE FRACTURE;  Surgeon: Nadara Mustard, MD;  Location: [facility] OR;  Service: Orthopedics;  Laterality: Right;  Open Reduction Internal Fixation Right Ankle  . ORIF CLAVICULAR FRACTURE Left 08/29/2012   Dr Ophelia Charter  . ORIF CLAVICULAR FRACTURE Left 08/29/2012   Procedure: OPEN REDUCTION INTERNAL FIXATION (ORIF) CLAVICULAR FRACTURE;  Surgeon: Eldred Manges, MD;  Location: [facility] OR;  Service: Orthopedics;  Laterality: Left;  Open Reduction Internal Fixation Left Clavicle Fracture  . TUBAL LIGATION    . WISDOM TOOTH EXTRACTION  OB History    Gravida Para Term Preterm AB Living             4   SAB TAB Ectopic Multiple Live Births                  Obstetric Comments   S/p hysterectomy       Home Medications    Prior to Admission medications   Medication Sig Start Date End Date Taking? Authorizing Provider  cyclobenzaprine (FLEXERIL) 10 MG tablet Take 1 tablet (10 mg total) by mouth 2 (two) times daily as needed for muscle spasms. 11/02/15   Fayrene Helper, PA-C  diazepam (VALIUM) 5 MG tablet Take 5 mg by mouth at bedtime as needed for anxiety.    Historical Provider, MD  gabapentin (NEURONTIN) 300 MG  capsule Take 300 mg by mouth 3 (three) times daily.    Historical Provider, MD  hydrochlorothiazide (HYDRODIURIL) 12.5 MG tablet Take 12.5 mg by mouth daily.    Historical Provider, MD  hydrOXYzine (VISTARIL) 25 MG capsule Take 25 mg by mouth 3 (three) times daily as needed for anxiety.    Historical Provider, MD  ibuprofen (ADVIL,MOTRIN) 800 MG tablet Take 1 tablet (800 mg total) by mouth 3 (three) times daily. 11/02/15   Fayrene Helper, PA-C  metoprolol succinate (TOPROL-XL) 25 MG 24 hr tablet Take 25 mg by mouth daily.    Historical Provider, MD  naproxen (NAPROSYN) 500 MG tablet Take 500 mg by mouth 2 (two) times daily as needed for moderate pain.    Historical Provider, MD  oxyCODONE-acetaminophen (PERCOCET/ROXICET) 5-325 MG tablet Take 1-2 tablets by mouth every 4 (four) hours as needed for severe pain. 10/06/15   Bethel Born, PA-C  prazosin (MINIPRESS) 1 MG capsule Take 1 mg by mouth at bedtime.    Historical Provider, MD  predniSONE (DELTASONE) 50 MG tablet Take 1 tablet (50 mg total) by mouth daily. 10/02/15   Linwood Dibbles, MD  Sofosbuvir-Velpatasvir (EPCLUSA) 400-100 MG TABS Take 1 tablet by mouth daily. 08/26/15   Gardiner Barefoot, MD  venlafaxine XR (EFFEXOR-XR) 75 MG 24 hr capsule Take 75 mg by mouth 3 (three) times daily.     Historical Provider, MD  zolpidem (AMBIEN) 10 MG tablet Take 10 mg by mouth at bedtime as needed for sleep.    Historical Provider, MD    Family History Family History  Problem Relation Age of Onset  . Alcohol abuse Father   . Cirrhosis Father   . Cancer Father   . Heart attack Mother   . Hypertension Mother   . Hyperthyroidism Sister   . Bipolar disorder Sister   . Kidney disease Brother     Social History Social History  Substance Use Topics  . Smoking status: Current Every Day Smoker    Packs/day: 0.40    Years: 10.00    Types: Cigarettes    Start date: 03/28/1990  . Smokeless tobacco: Never Used     Comment: cutting back  . Alcohol use No      Comment: on occasion     Allergies   Sulfa antibiotics; Eggs or egg-derived products; and Milk-related compounds   Review of Systems Review of Systems  Constitutional: Negative for chills and fever.  Respiratory: Negative for shortness of breath.   Cardiovascular: Negative for chest pain.  Musculoskeletal: Positive for arthralgias and myalgias.    Physical Exam Updated Vital Signs BP 139/100 (BP Location: Left Arm)   Pulse 75   Temp 98.4  F (36.9 C) (Oral)   Resp 18   SpO2 100%   Physical Exam  Constitutional: She is oriented to person, place, and time. She appears well-developed and well-nourished. No distress.  HENT:  Head: Normocephalic and atraumatic.  Eyes: Conjunctivae are normal.  Cardiovascular: Normal rate.   Pulmonary/Chest: Effort normal.  Musculoskeletal:  Pain to the right lumbosacral region  Pain to posterior calf on palpation Pain to medial aspect of right thigh with no bruising or edema Normal flexion/extension  Neurological: She is alert and oriented to person, place, and time.  Skin: Skin is warm and dry.  Psychiatric: She has a normal mood and affect.  Nursing note and vitals reviewed.   ED Treatments / Results  DIAGNOSTIC STUDIES:  Oxygen Saturation is  100% on room air, normal by my interpretation.    COORDINATION OF CARE:  2:53 PM Discussed treatment plan with pt at bedside and pt agreed to plan.  Labs (all labs ordered are listed, but only abnormal results are displayed) Labs Reviewed - No data to display  EKG  EKG Interpretation None       Radiology Dg Hip Unilat  With Pelvis 2-3 Views Right  Result Date: 11/02/2015 CLINICAL DATA:  Status post fall 2 days ago with a right hip injury. Pain. Initial encounter. EXAM: DG HIP (WITH OR WITHOUT PELVIS) 2-3V RIGHT COMPARISON:  None. FINDINGS: There is no evidence of hip fracture or dislocation. There is no evidence of arthropathy or other focal bone abnormality. IMPRESSION: Negative  exam. Electronically Signed   By: Drusilla Kannerhomas  Dalessio M.D.   On: 11/02/2015 13:05    Procedures Procedures   Medications Ordered in ED Medications - No data to display   Initial Impression / Assessment and Plan / ED Course  I have reviewed the triage vital signs and the nursing notes.  Pertinent labs & imaging results that were available during my care of the patient were reviewed by me and considered in my medical decision making (see chart for details).  Clinical Course    BP (!) 142/101 (BP Location: Right Arm)   Pulse 71   Temp 98.4 F (36.9 C) (Oral)    Final Clinical Impressions(s) / ED Diagnoses   Patient X-Ray negative for obvious fracture or dislocation.  Pt advised to follow up with orthopedics. Patient given crutches and RICE while in ED, conservative therapy recommended and discussed. Patient will be discharged home & is agreeable with above plan. Returns precautions discussed. Pt appears safe for discharge.  Final diagnoses:  Muscle strain of right thigh, initial encounter    New Prescriptions New Prescriptions   IBUPROFEN (ADVIL,MOTRIN) 800 MG TABLET    Take 1 tablet (800 mg total) by mouth 3 (three) times daily.   I personally performed the services described in this documentation, which was scribed in my presence. The recorded information has been reviewed and is accurate.       Fayrene HelperBowie Brylyn Novakovich, PA-C 11/02/15 1620    Rolan BuccoMelanie Belfi, MD 11/02/15 [phone]

## 2015-11-10 ENCOUNTER — Telehealth: Payer: Self-pay | Admitting: Internal Medicine

## 2015-11-10 NOTE — Telephone Encounter (Signed)
Pt states she slipped and fell and went to the ED and the [facility]. Delbert HarnessMurphy Wainer told the pt she needed a referral from here in order to see her. Please advise. Thanks! ep

## 2015-11-11 NOTE — Telephone Encounter (Signed)
Pt called again about referral. Please advise. Thanks! ep

## 2015-11-12 NOTE — Telephone Encounter (Signed)
Pt coming in for an appt next Thursday. Jaquille Kau Bruna PotterBlount, CMA

## 2015-11-19 ENCOUNTER — Encounter: Payer: Self-pay | Admitting: Internal Medicine

## 2015-11-19 ENCOUNTER — Ambulatory Visit (INDEPENDENT_AMBULATORY_CARE_PROVIDER_SITE_OTHER): Payer: Medicaid Other | Admitting: Internal Medicine

## 2015-11-19 VITALS — BP 146/107 | HR 95 | Temp 98.2°F | Wt 174.0 lb

## 2015-11-19 DIAGNOSIS — S6991XD Unspecified injury of right wrist, hand and finger(s), subsequent encounter: Secondary | ICD-10-CM

## 2015-11-19 DIAGNOSIS — M79644 Pain in right finger(s): Secondary | ICD-10-CM | POA: Diagnosis not present

## 2015-11-19 DIAGNOSIS — M25551 Pain in right hip: Secondary | ICD-10-CM | POA: Diagnosis not present

## 2015-11-19 DIAGNOSIS — M79651 Pain in right thigh: Secondary | ICD-10-CM | POA: Diagnosis not present

## 2015-11-19 MED ORDER — TRAMADOL HCL 50 MG PO TABS: 50.0000 mg | ORAL_TABLET | Freq: Two times a day (BID) | ORAL | 0 refills | Status: DC | PRN

## 2015-11-19 MED ORDER — CYCLOBENZAPRINE HCL 10 MG PO TABS: 10.0000 mg | ORAL_TABLET | Freq: Two times a day (BID) | ORAL | 0 refills | Status: DC | PRN

## 2015-11-19 NOTE — Patient Instructions (Signed)
Ms. Sunny SchleinWooten,  Continue ibuprofen and flexeril. Please take gabapentin scheduled.  Use tramadol sparingly, as can be sedating and habit forming.  I placed orders for xrays of back and hand. I will call you with results.   Please follow-up next week.  Best, Dr. Sampson GoonFitzgerald

## 2015-11-19 NOTE — Progress Notes (Signed)
[facility] Progress Note  Subjective:  [name] Sleep is a 40-y/o female who presents for R buttock/thigh pain. History of anterior cervical discectomy and fusion January 2017 for painful radiculopathy symptoms. Also with known cervical degenerative spondylosis.   R buttock/thigh pain: - Began after fall about 3 weeks ago. Effectively did the splits, slipping on slick steps, and landed on her R buttock and hit her R hand. - Says she has been "duck-walking" since that time due to painful pulling/burning sensation at back of R thigh and buttock; says when she sits it feels as if she is sitting on a mound - R hip xray in ED negative for fracture or dislocation to suggest referred pain - Was told in ED she may need to consider ortho referral; is afraid of split tendon - Says she is "ibuprofen'ed out" and also has had minimal relief from flexeril and gabapentin (has been taking prn for fear it was causing her to gain weight) - Says she has completed 2 recent courses of steroids for neck pain ROS: No loss of bowel or bladder function, no weakness  R pinky pain: - Cannot bend fully since fall - Thinks is broken  Social: Current Smoker   Objective: Blood pressure (!) 146/107, pulse 95, temperature 98.2 F (36.8 C), temperature source Oral, weight 174 lb (78.9 kg), SpO2 99 %. Constitutional: Pleasant female, in NAD Musculoskeletal: ROM normal L leg. Cannot extend R leg past ~ 30 degrees due to pain (posterior thigh and buttock). Negative FABER test bilaterally. No joint line tenderness of knees bilaterally. No pain with varus or valgus stress at knee. No palpable masses of posterior thighs or buttock. Gait with valgus in-turning of R knee. No midline spine tenderness. Grip strength 5/5 bilaterally but cannot bend R 5th finger at MP joint but no swelling or tenderness. FROM at wrists bilaterally.  Neurological: AOx3, no focal deficits. Peripheral sensation intact. Numbness in R leg  into toes elicited with palpation of posterior thigh. Skin: Skin is warm and dry. No rash noted. No erythema. No ecchymosis.  Psychiatric: Normal mood and anxious affect.  Vitals reviewed  Assessment/Plan: Right thigh pain - Unclear etiology but concerning for radiculopathy given history of cervical spine degenerative disease and radiating pain throughout leg with R straight leg raise. - Ordered xray lumbar spine to look for obvious signs of fracture, disc bulging - Encouraged patient to take gabapentin regularly and naproxen - Prescribed tramadol (#30, written as q12h prn for severe pain) as patient denies relief from other treatments tried, including flexeril - Likely will refer to PT, pending results of back x-ray  Finger injury - Ordered R hand xray  Follow-up in about 1-2 weeks.  Dani GobbleHillary Kahlen Boyde, MD [facility], PGY-2

## 2015-11-21 DIAGNOSIS — M79651 Pain in right thigh: Secondary | ICD-10-CM | POA: Insufficient documentation

## 2015-11-21 DIAGNOSIS — S6990XA Unspecified injury of unspecified wrist, hand and finger(s), initial encounter: Secondary | ICD-10-CM | POA: Insufficient documentation

## 2015-11-21 NOTE — Assessment & Plan Note (Signed)
-   Ordered R hand xray

## 2015-11-21 NOTE — Assessment & Plan Note (Addendum)
-   Unclear etiology but concerning for radiculopathy given history of cervical spine degenerative disease and radiating pain throughout leg with R straight leg raise. - Ordered xray lumbar spine to look for obvious signs of fracture, disc bulging - Encouraged patient to take gabapentin regularly and naproxen - Prescribed tramadol (#30, written as q12h prn for severe pain) as patient denies relief from other treatments tried, including flexeril - Likely will refer to PT, pending results of back x-ray

## 2015-11-24 ENCOUNTER — Ambulatory Visit (HOSPITAL_COMMUNITY): Payer: Medicaid Other

## 2015-11-26 ENCOUNTER — Telehealth: Payer: Self-pay | Admitting: Internal Medicine

## 2015-11-26 NOTE — Telephone Encounter (Signed)
Patient will likely need to be seen, will forward to last MD seen and PCP.

## 2015-11-26 NOTE — Telephone Encounter (Signed)
I will not consider prescribing anything stronger unless seen again and likely would not be appropriate at that time. Will be difficult to provide appropriate care without the xray. She was supposed to have follow-up this week. If pain becomes too severe, please go to Emergency Room. I would, however, be willing to place PT orders at this time, if patient is interested.

## 2015-11-26 NOTE — Telephone Encounter (Signed)
Tramadol isnt working. She would like something stronger. She hasnt been for the xray because she doesn't feel she can lay down for it. Please advise

## 2015-11-26 NOTE — Telephone Encounter (Signed)
Pt informed. Deseree Blount, CMA  

## 2015-12-02 ENCOUNTER — Ambulatory Visit: Payer: Medicaid Other | Admitting: Family Medicine

## 2015-12-05 IMAGING — US US ABDOMEN COMPLETE
1 series · 14 of 25 positions shown · non-contrast
Comparison: CT ABD/PELV WO CM dated 04/15/2013; US ABDOMEN COMPLETE
dated 02/27/2012

CLINICAL DATA: Abdominal pain and nausea. Elevated liver function
tests.

EXAM:
ULTRASOUND ABDOMEN COMPLETE

[Series 1: us abdomen complete · 0.26mm/px · 14 of 45 slices shown]
[im 1/45]
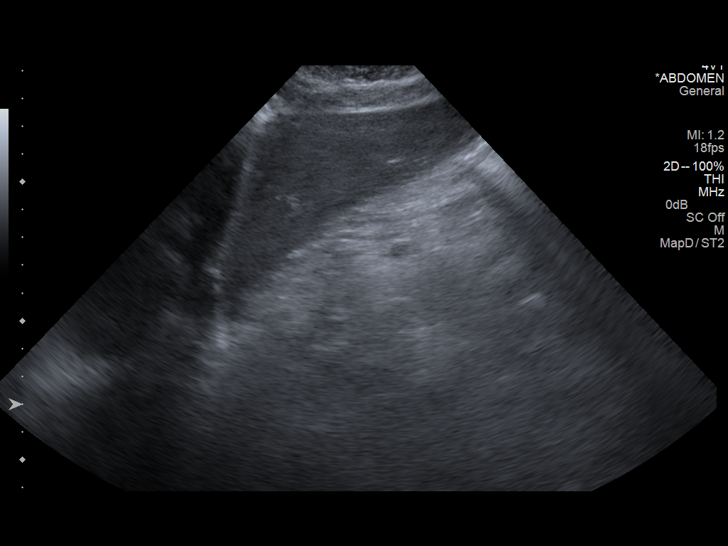
[im 4/45]
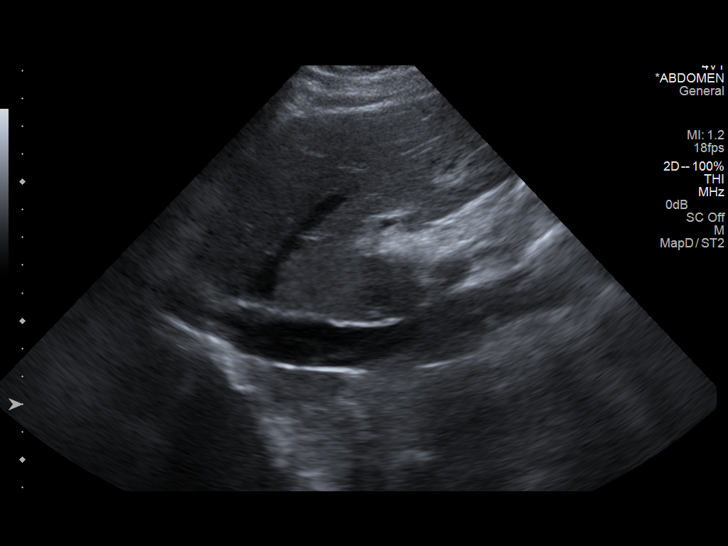
[im 8/45]
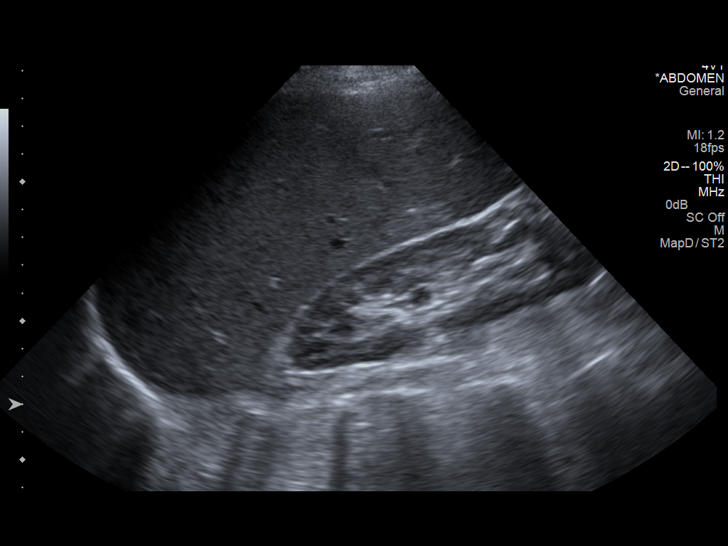
[im 12/45]
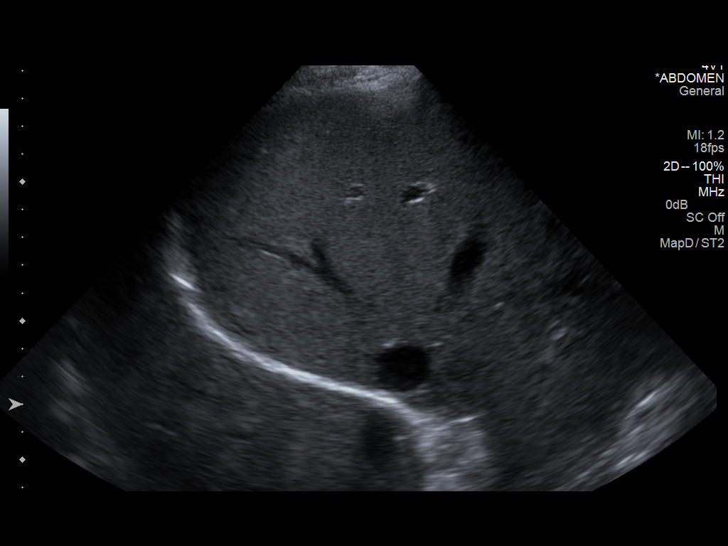
[im 15/45]
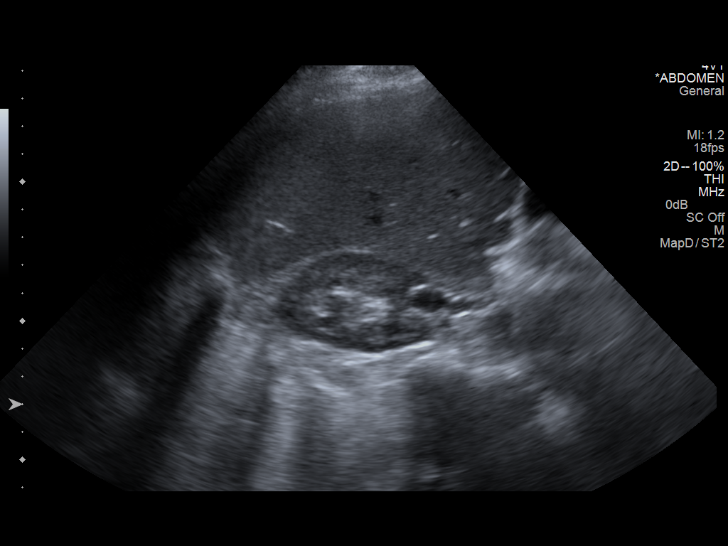
[im 17/45]
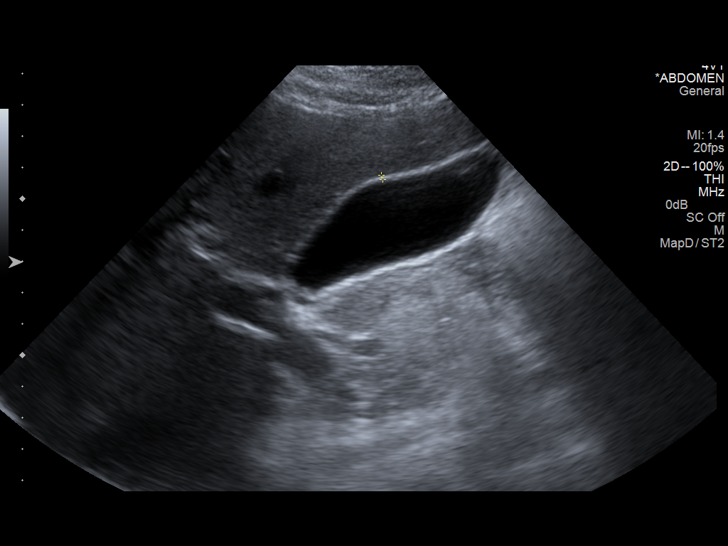
[im 21/45]
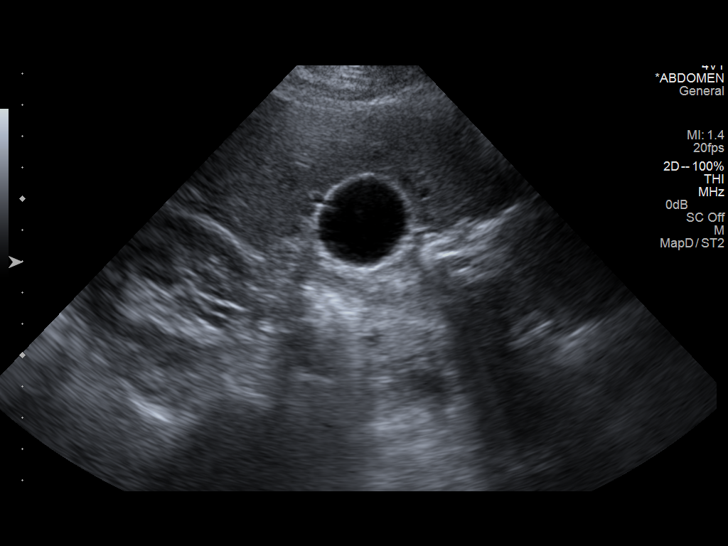
[im 24/45]
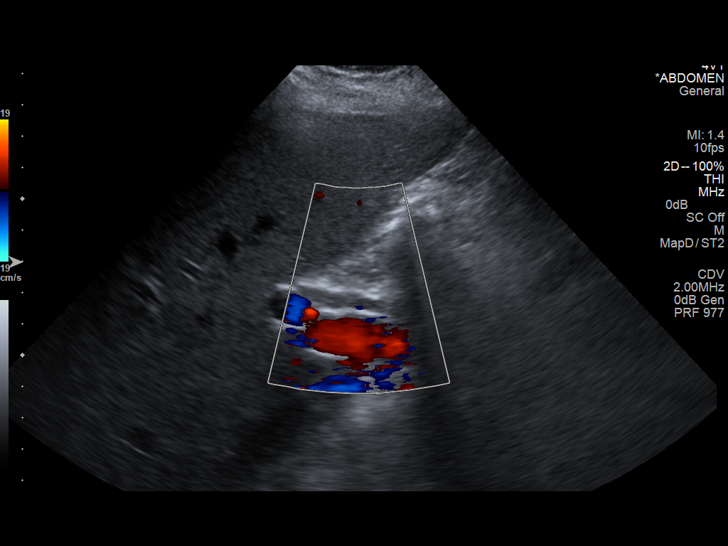
[im 28/45]
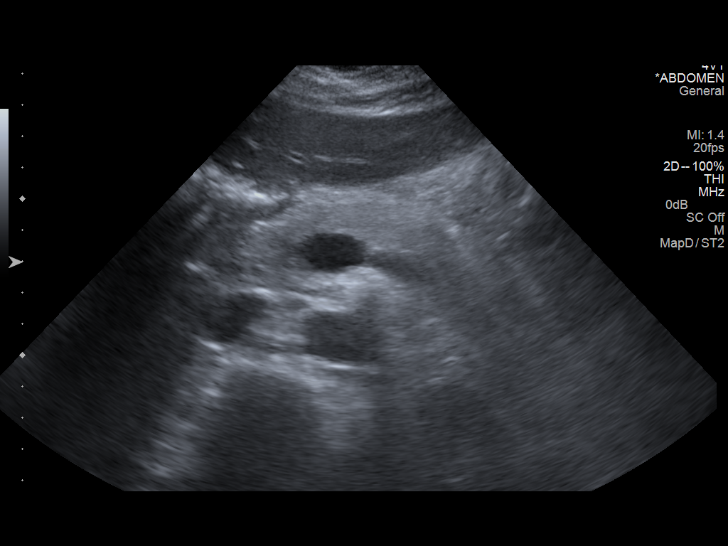
[im 30/45]
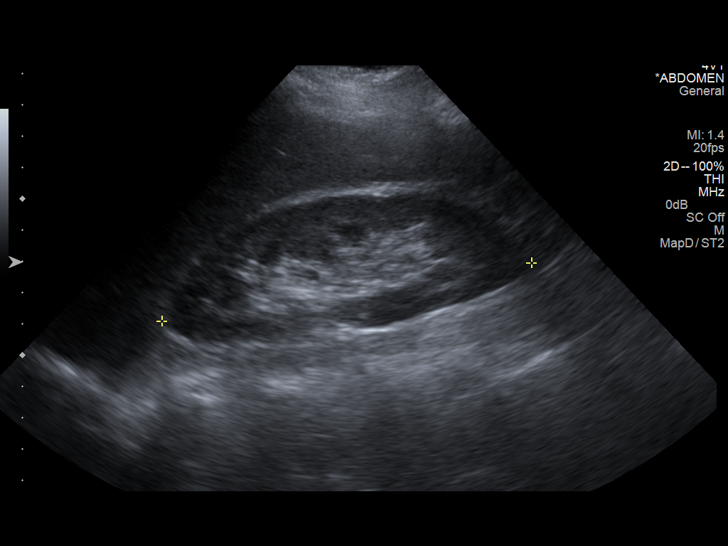
[im 34/45]
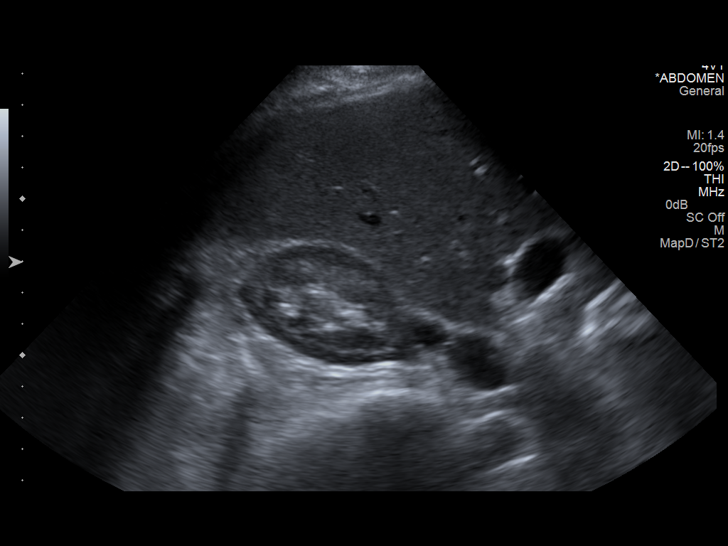
[im 37/45]
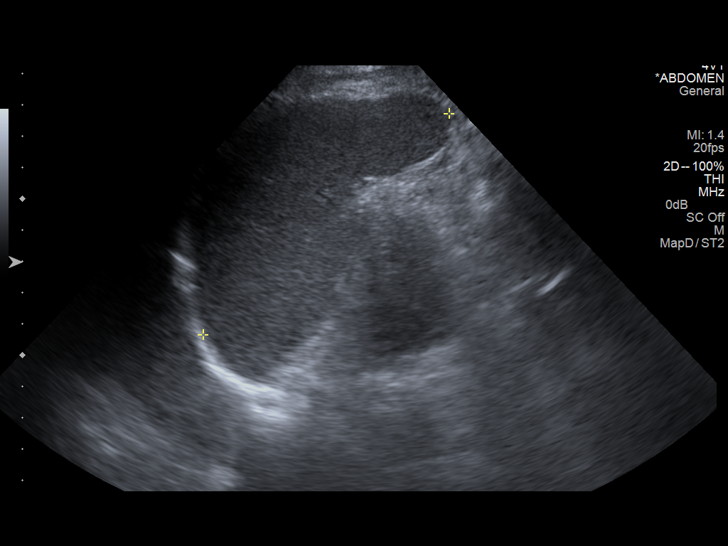
[im 41/45]
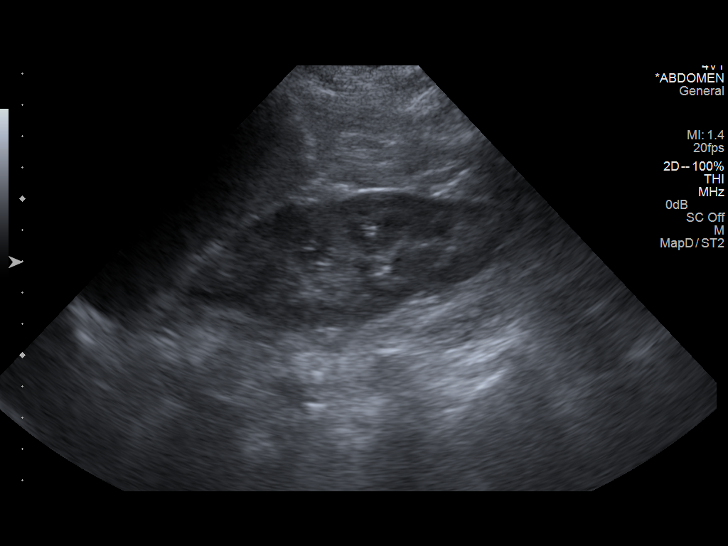
[im 45/45]
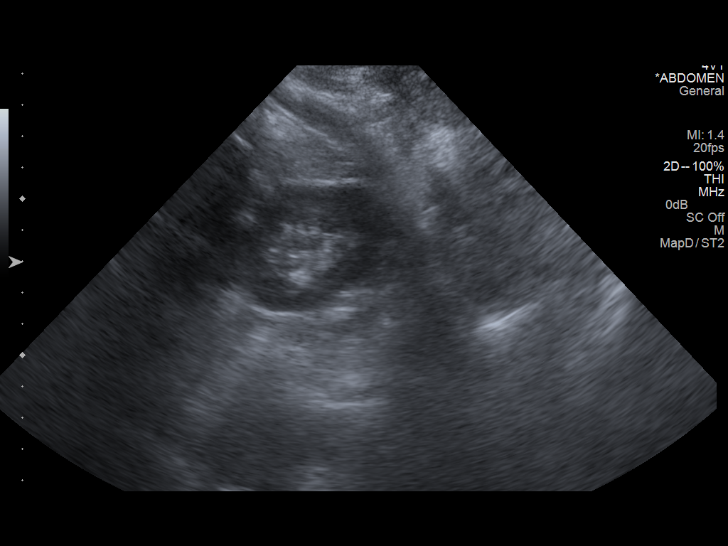

[14 of 25 positions shown; findings below may reference images not displayed]

FINDINGS: Gallbladder:

No gallstones or wall thickening visualized. No sonographic Murphy
sign noted.

Common bile duct:

Diameter: Normal, 5 mm

Liver:

No focal lesion identified. Within normal limits in parenchymal
echogenicity.

IVC:

No abnormality visualized.

Pancreas:

Visualized portion unremarkable.

Spleen:

Size and appearance within normal limits.

Right Kidney:

Length: 12.0. Echogenicity within normal limits. No mass or
hydronephrosis visualized.

Left Kidney:

Length: 12.8. Echogenicity within normal limits. No mass or
hydronephrosis visualized.

Abdominal aorta:

No aneurysm visualized.

Other findings:

None.
IMPRESSION: Normal abdominal ultrasound.  No explanation for abdominal pain.

## 2015-12-05 IMAGING — CT CT ABD-PELV W/O CM
2 of 4 series · 17 of 46 positions shown, 19 images · non-contrast
Comparison: CT ABD/PELVIS W CM dated 04/26/2011

CLINICAL DATA: Hepatitis-C with kidney stones and right low back
pain.

EXAM:
CT ABDOMEN AND PELVIS WITHOUT CONTRAST
TECHNIQUE: Multidetector CT imaging of the abdomen and pelvis was performed
following the standard protocol without intravenous contrast.

[Series 2: stone study · axial · 0.68mm/px · z∈[-457,-52]mm · 14 of 89 slices shown, 16 images]
[im 4/89  soft-tissue]
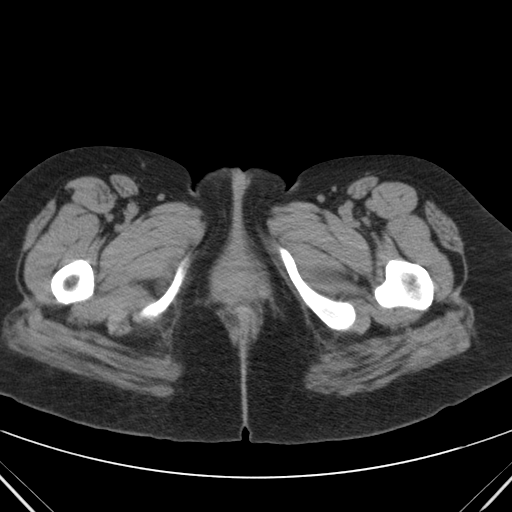
[im 4/89  bone]
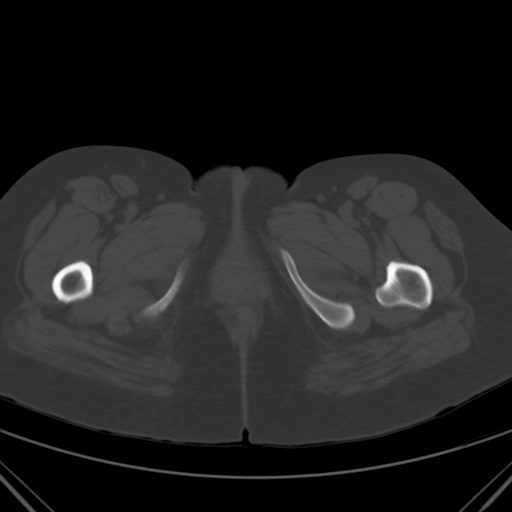
[im 11/89  soft-tissue]
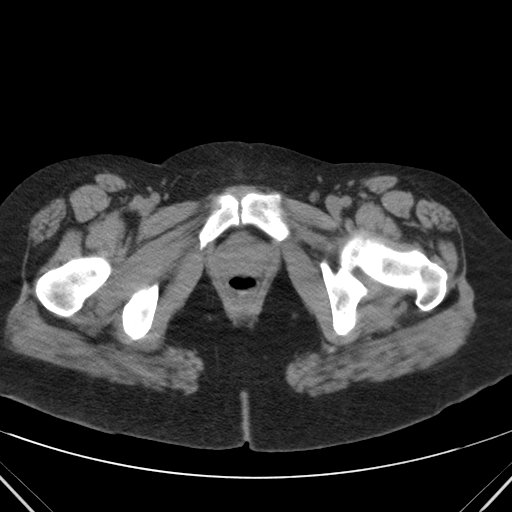
[im 17/89  soft-tissue]
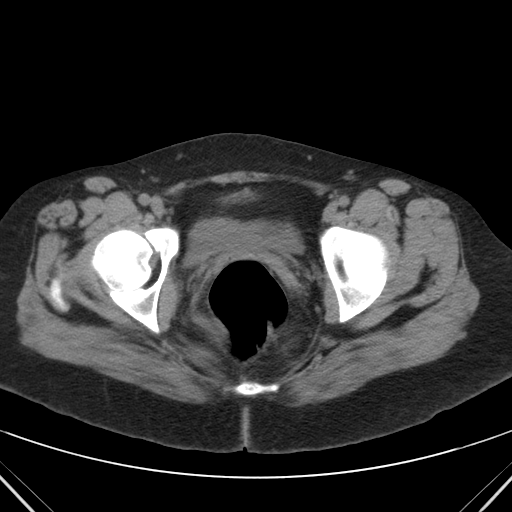
[im 24/89  soft-tissue]
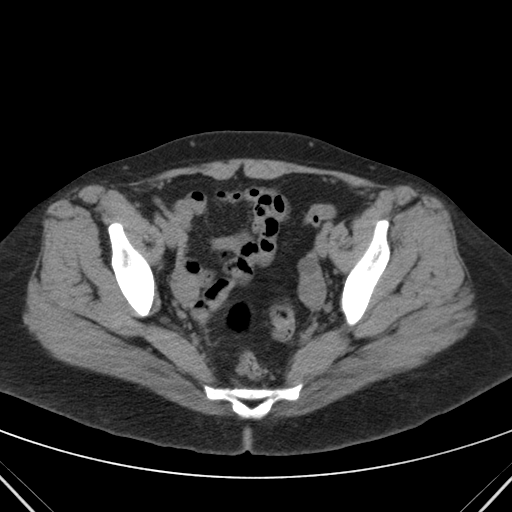
[im 31/89  soft-tissue]
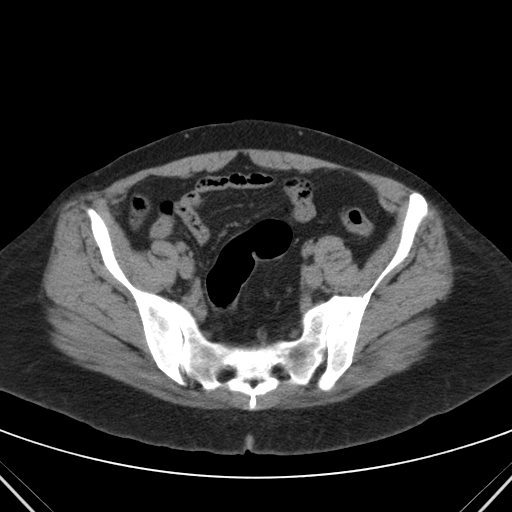
[im 34/89  soft-tissue]
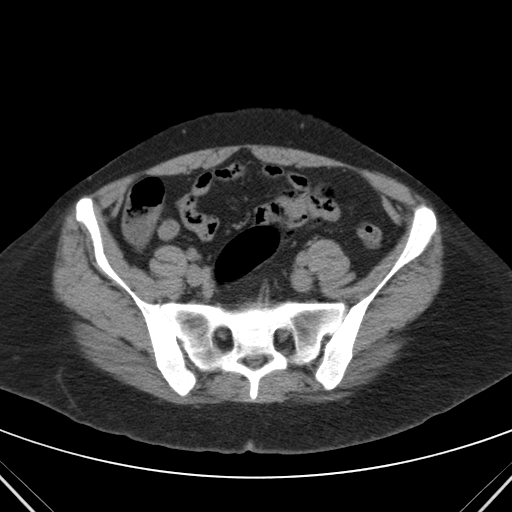
[im 41/89  soft-tissue]
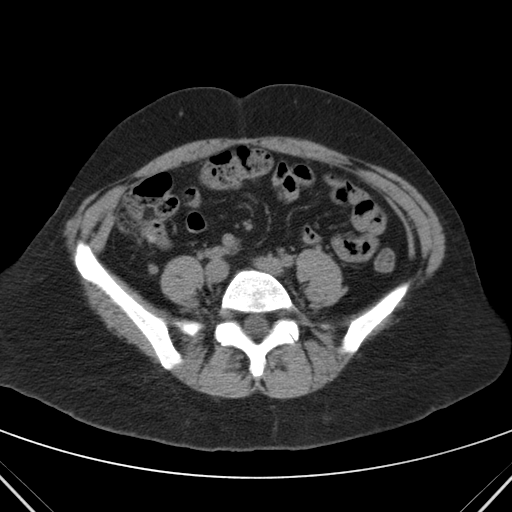
[im 48/89  soft-tissue]
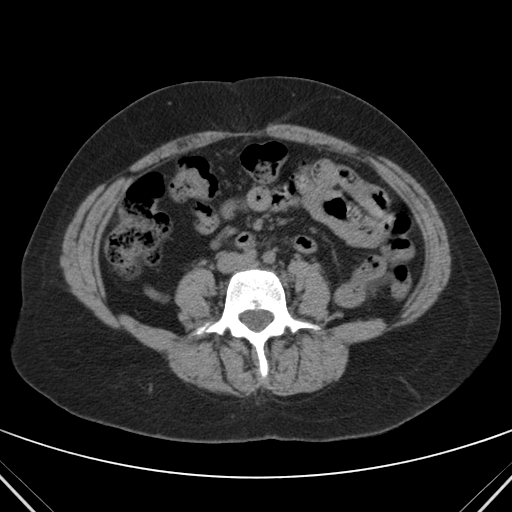
[im 55/89  soft-tissue]
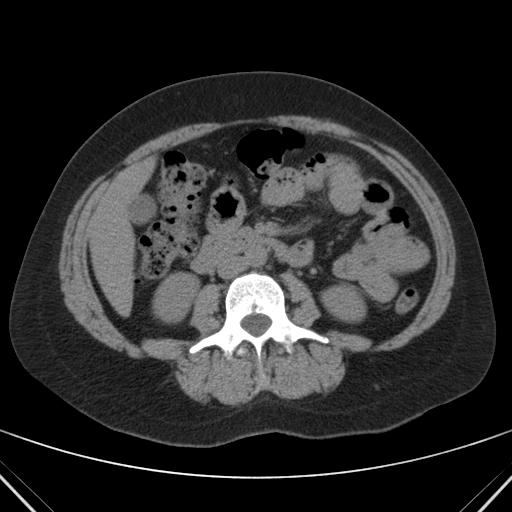
[im 55/89  bone]
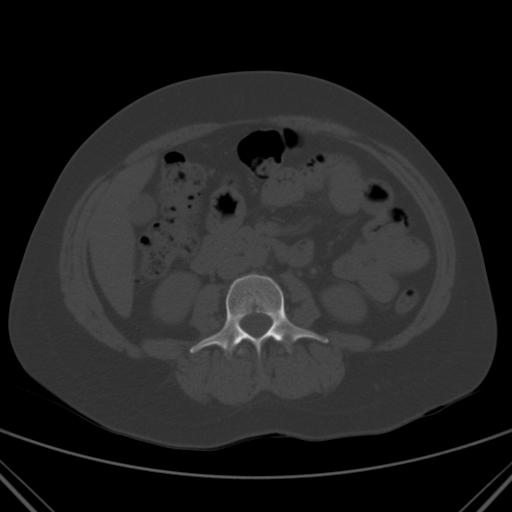
[im 58/89  soft-tissue]
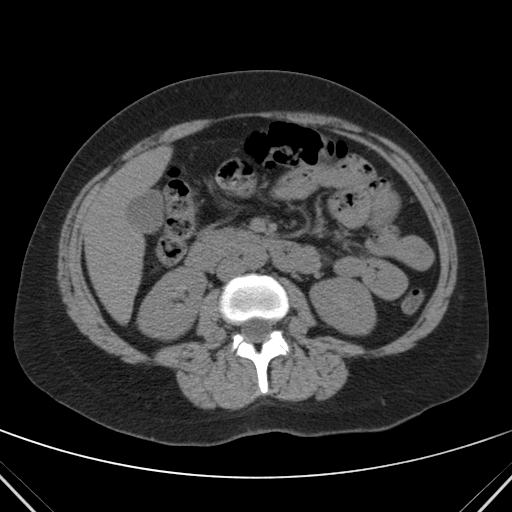
[im 65/89  soft-tissue]
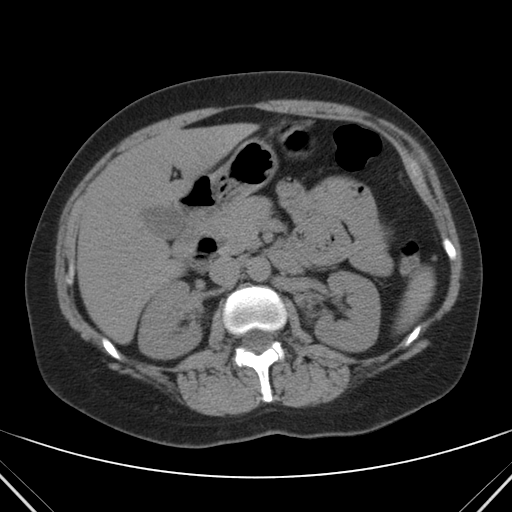
[im 72/89  soft-tissue]
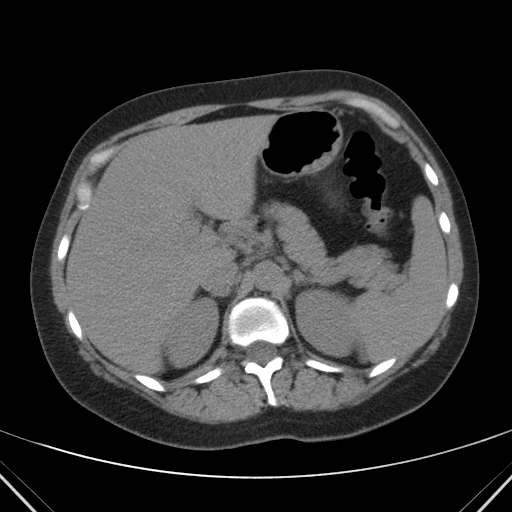
[im 78/89  soft-tissue]
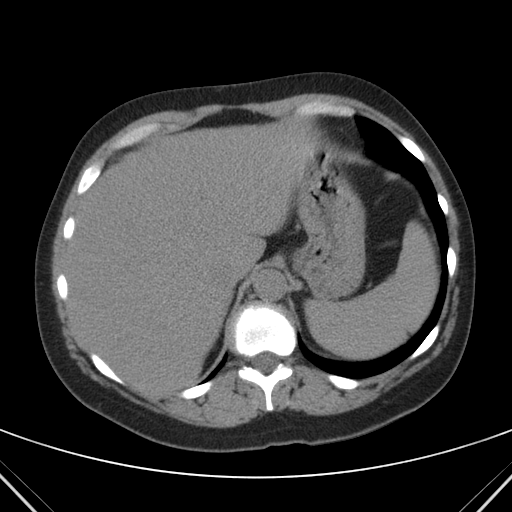
[im 85/89  soft-tissue]
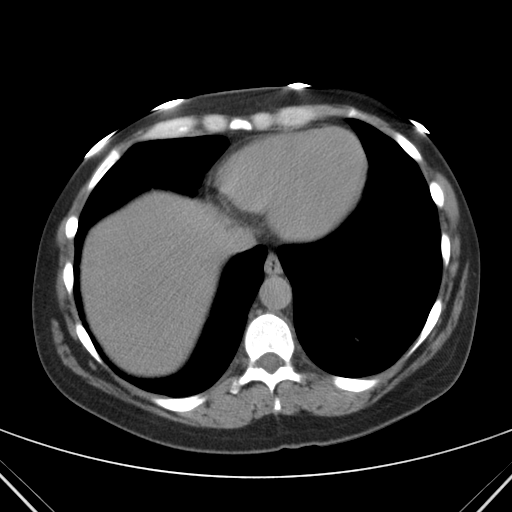

[cor · coronal · 0.86mm/px · 3 of 102 slices shown]
[im 34/102  soft-tissue]
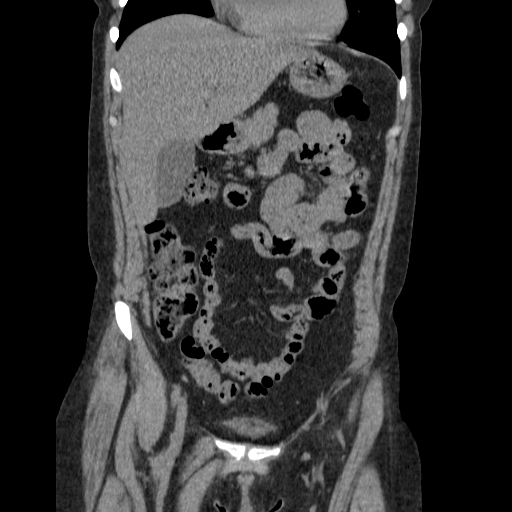
[im 45/102  soft-tissue]
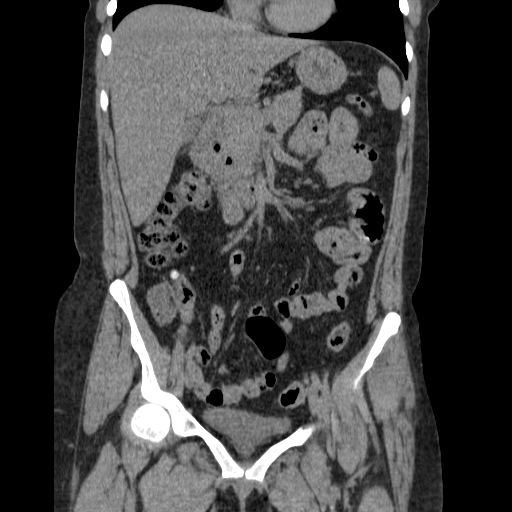
[im 57/102  soft-tissue]
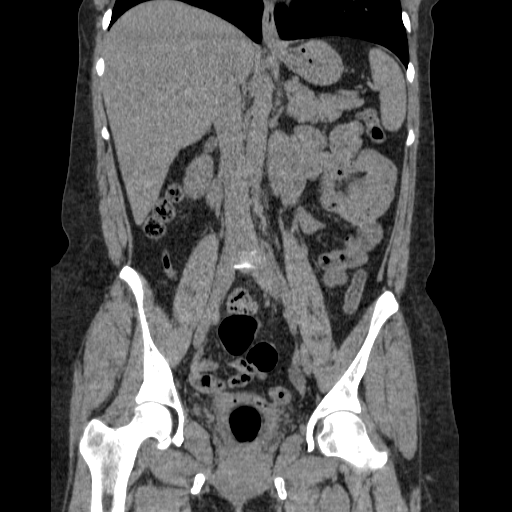

[17 of 46 positions shown; findings below may reference images not displayed]

FINDINGS: Lower Chest: Clear lung bases. Normal heart size without pericardial
or pleural effusion.

Abdomen/Pelvis: Mild hepatic steatosis and hepatomegaly, without
focal liver lesion. Normal spleen, stomach. Descending duodenal
diverticulum. Normal pancreas, gallbladder, biliary tract, adrenal
glands.

No renal calculi or hydronephrosis. No hydroureter or ureteric
calculi. No retroperitoneal or retrocrural adenopathy. Colonic stool
burden suggests constipation. Normal terminal ileum and appendix.
Normal small bowel without abdominal ascites. No pelvic adenopathy.
Normal urinary bladder. Hysterectomy. No adnexal mass. No
significant free fluid. Probable phleboliths in the pelvis.

Bones/Musculoskeletal: Lucencies in the right transverse process of
L4 and L1 are likely new since the prior exam. Degenerative disc
disease at L4-5 and L5-S1.
IMPRESSION: 1.  No acute process in the abdomen or pelvis.
2. Right-sided transverse process fractures which are new since
04/26/2011. Correlate with point tenderness. Favor nonacute.
3. Hepatic steatosis and hepatomegaly.
4.  Possible constipation.

## 2016-01-25 IMAGING — CR DG CHEST 2V
2 series · 2 of 2 positions shown · non-contrast
Comparison: none

[view not recorded (1 of 2)]
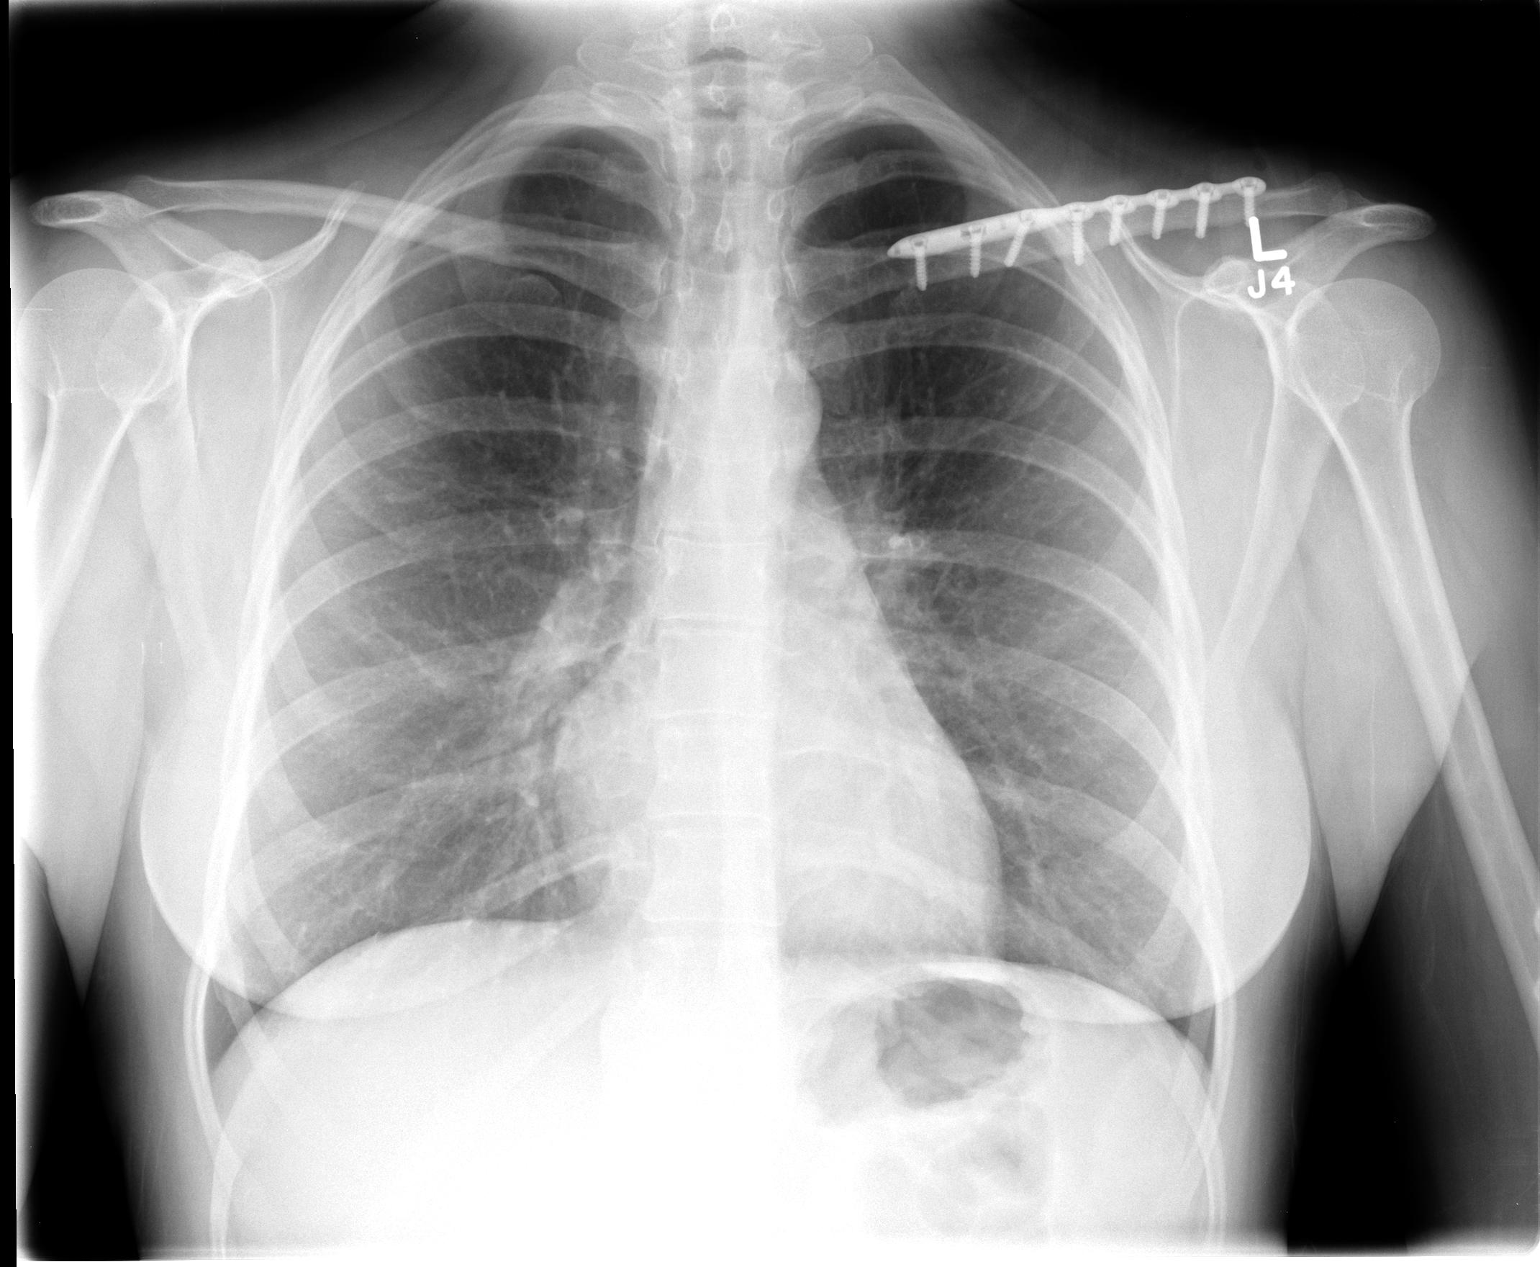

[view not recorded (2 of 2)]
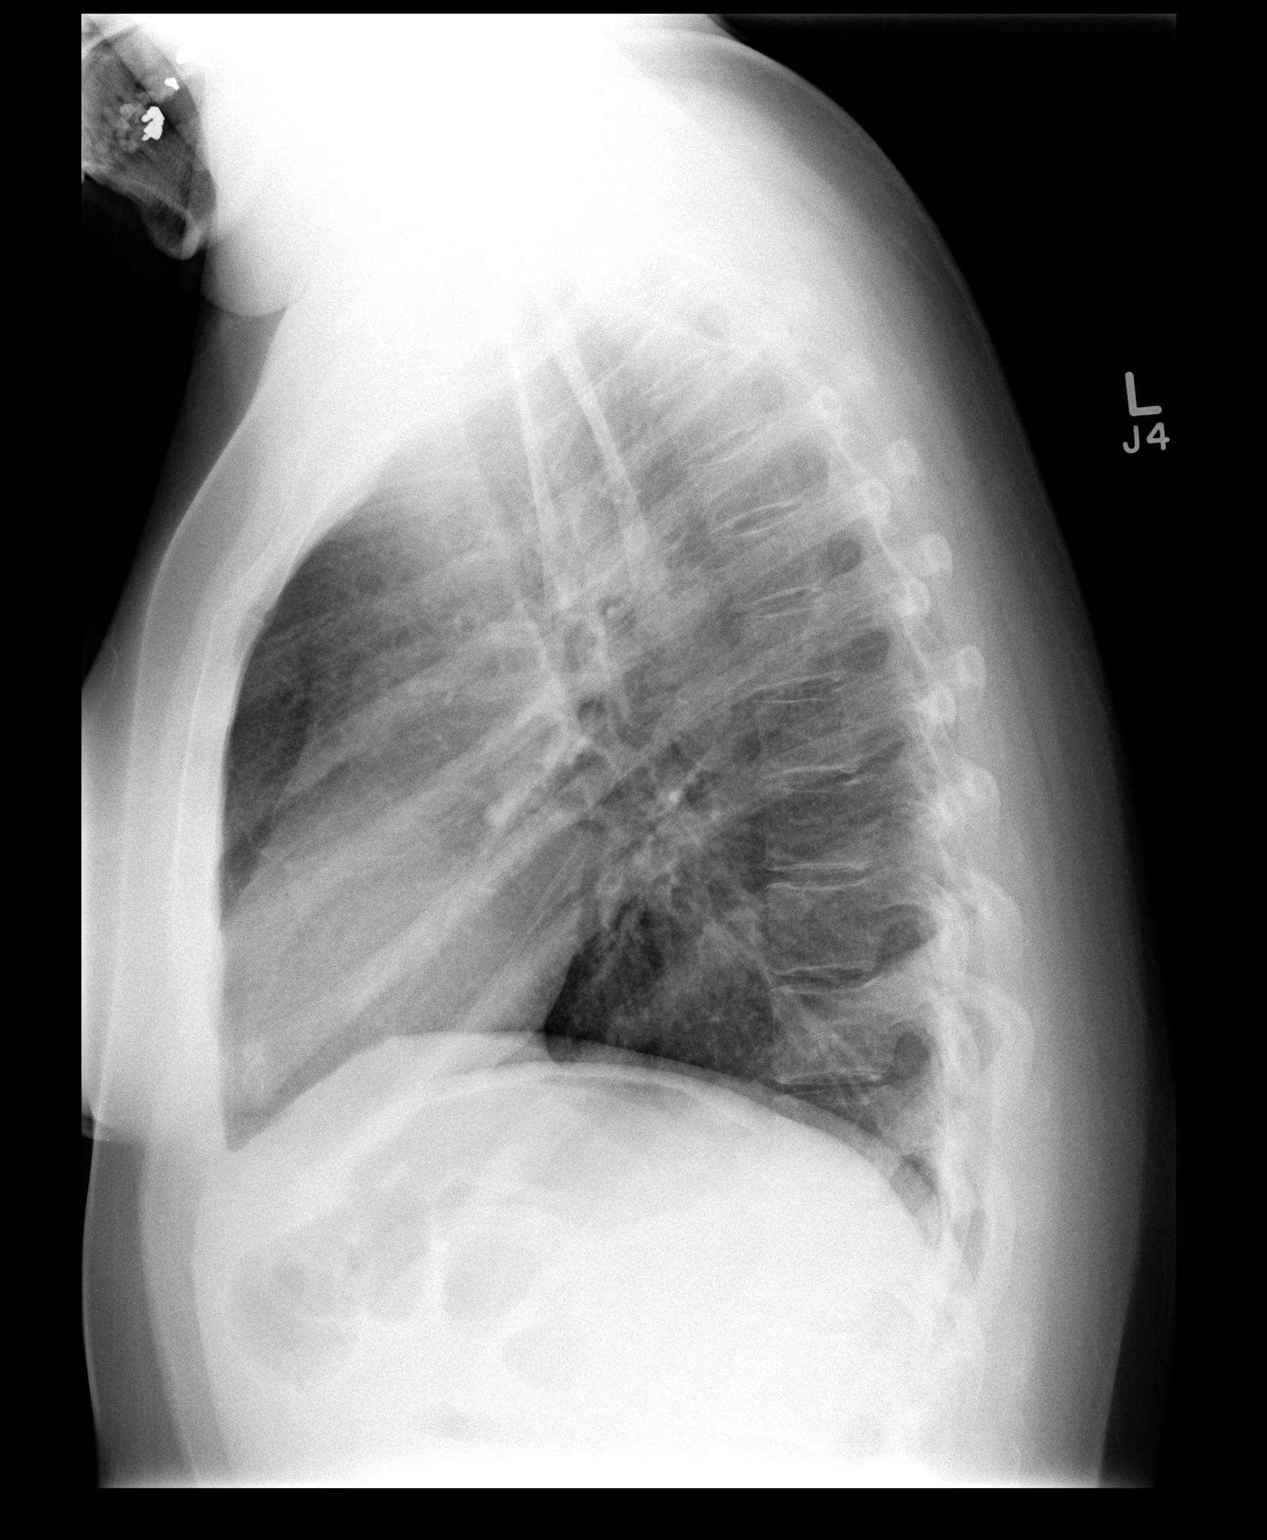

[2 of 2 positions shown; findings below may reference images not displayed]

CLINICAL DATA
Current smoker presenting with 2 week history of cough, shortness of
breath when supine, generalized body aches, and low-grade fever.

EXAM
CHEST  2 VIEW

COMPARISON
Two-view chest x-ray 08/29/2012, 10/02/2008.

FINDINGS
Cardiomediastinal silhouette unremarkable and unchanged.
Emphysematous changes in the upper lobes, left greater than right,
unchanged. Mildly prominent bronchovascular markings diffusely and
mild central peribronchial thickening, unchanged. Lungs otherwise
clear. No localized airspace consolidation. No pleural effusions. No
pneumothorax. Normal pulmonary vascularity. Prior ORIF of a left
clavicle fracture with healing. Visualized bony thorax otherwise
intact.

IMPRESSION
Stable COPD/emphysema, including chronic bronchitis. No acute
cardiopulmonary disease.

SIGNATURE

## 2016-05-14 IMAGING — CR DG CHEST 2V
2 series · 2 of 2 positions shown · non-contrast
Comparison: None.

CLINICAL DATA: Cough x 10 days

EXAM:
CHEST  2 VIEW

[view not recorded (1 of 2)]
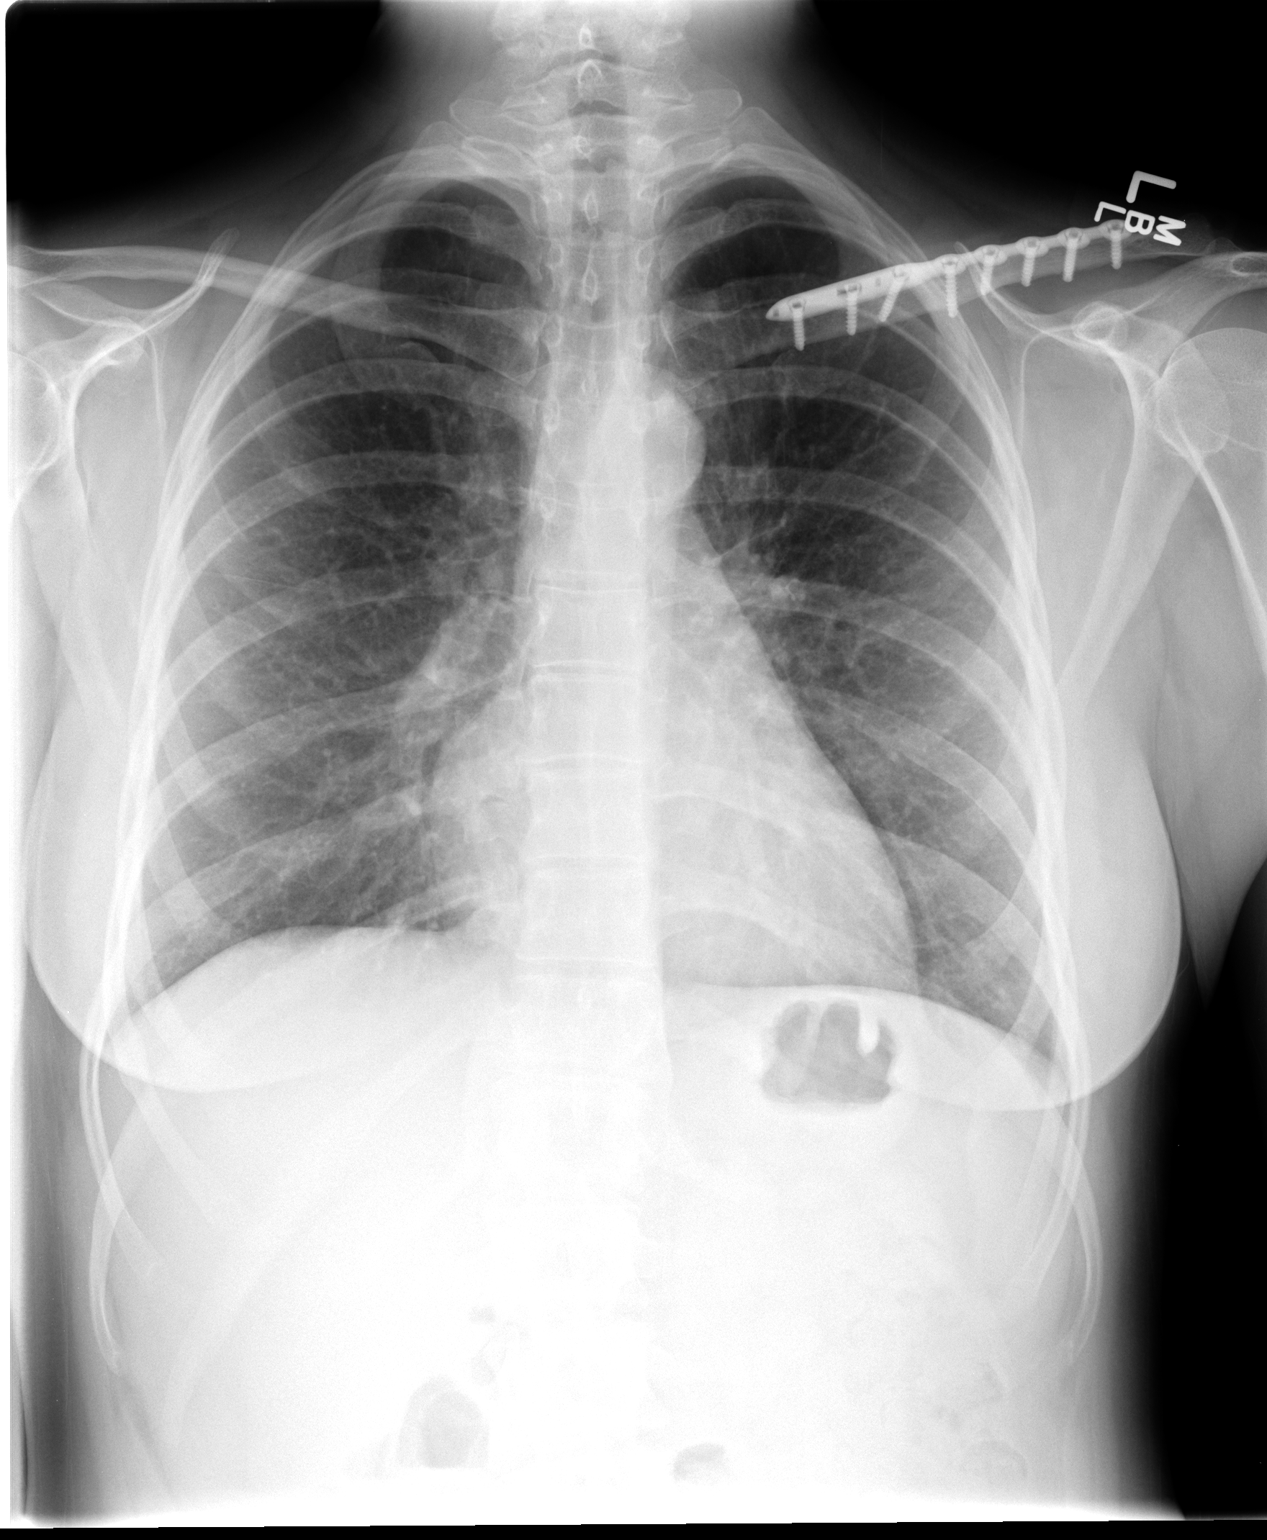

[view not recorded (2 of 2)]
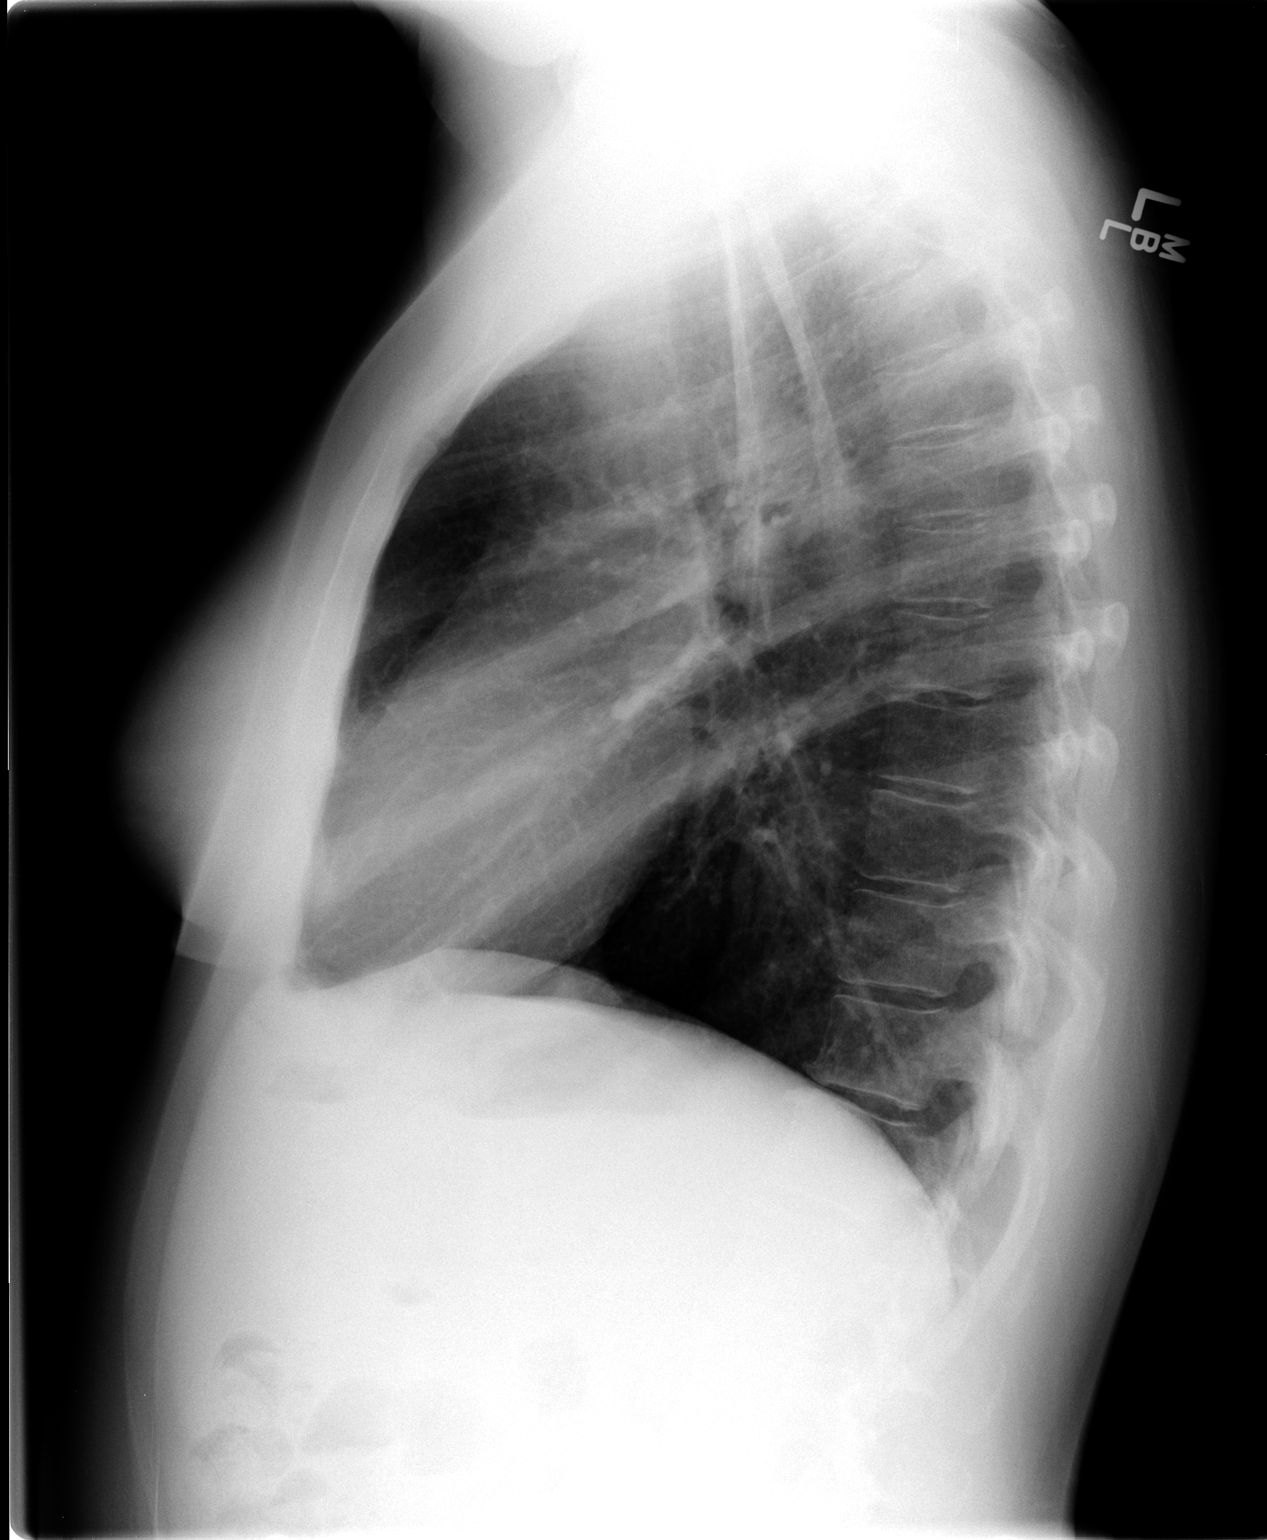

[2 of 2 positions shown; findings below may reference images not displayed]

FINDINGS: The heart size and mediastinal contours are within normal limits.
Both lungs are clear. Old left clavicular fracture with hardware in
place.
IMPRESSION: No active cardiopulmonary disease.

## 2016-06-10 IMAGING — CR DG WRIST COMPLETE 3+V*R*
3 series · 3 of 3 positions shown · non-contrast
Comparison: 12/28/2010 radiographs

CLINICAL DATA: 38-year-old female with right wrist numbness.

EXAM:
RIGHT WRIST - COMPLETE 3+ VIEW

[x wrist pa right (1 of 2)]
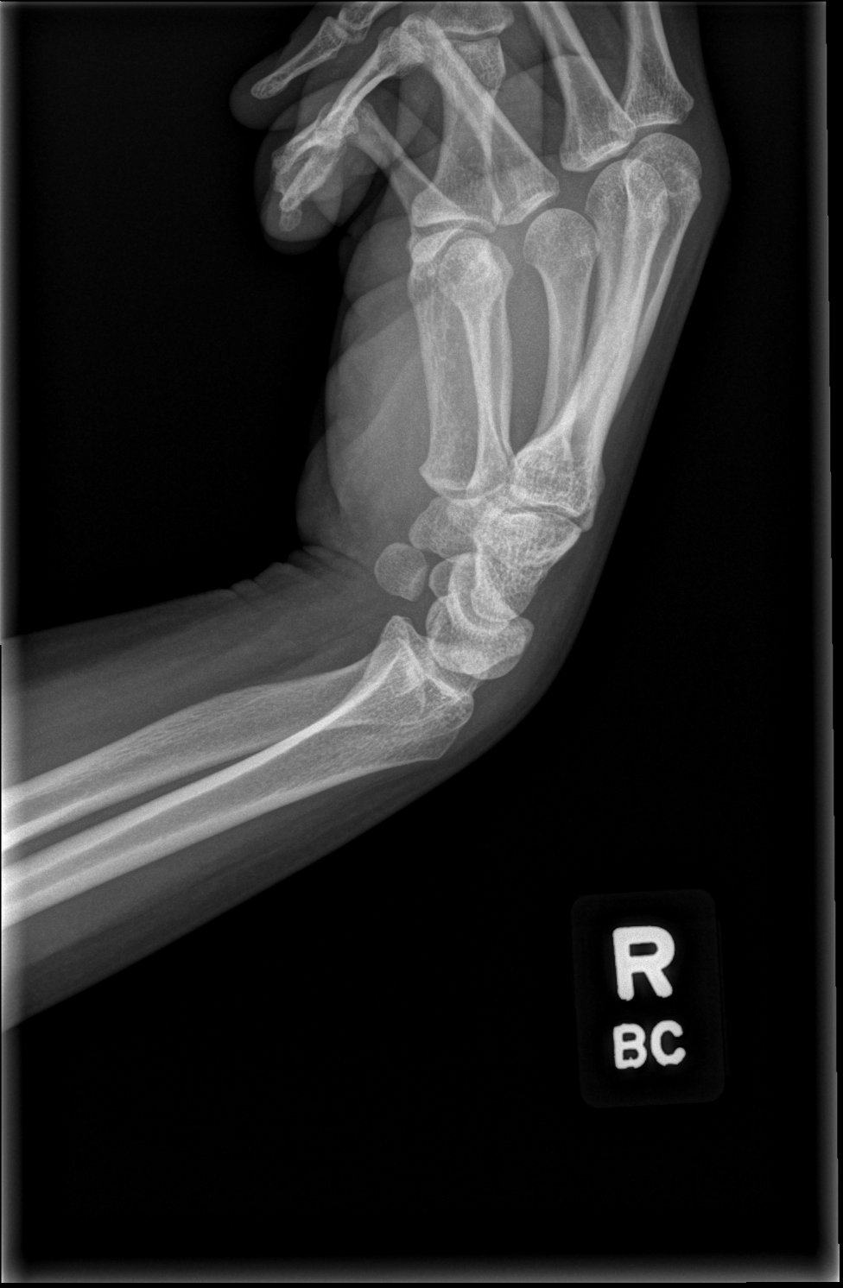

[x wrist pa right (2 of 2)]
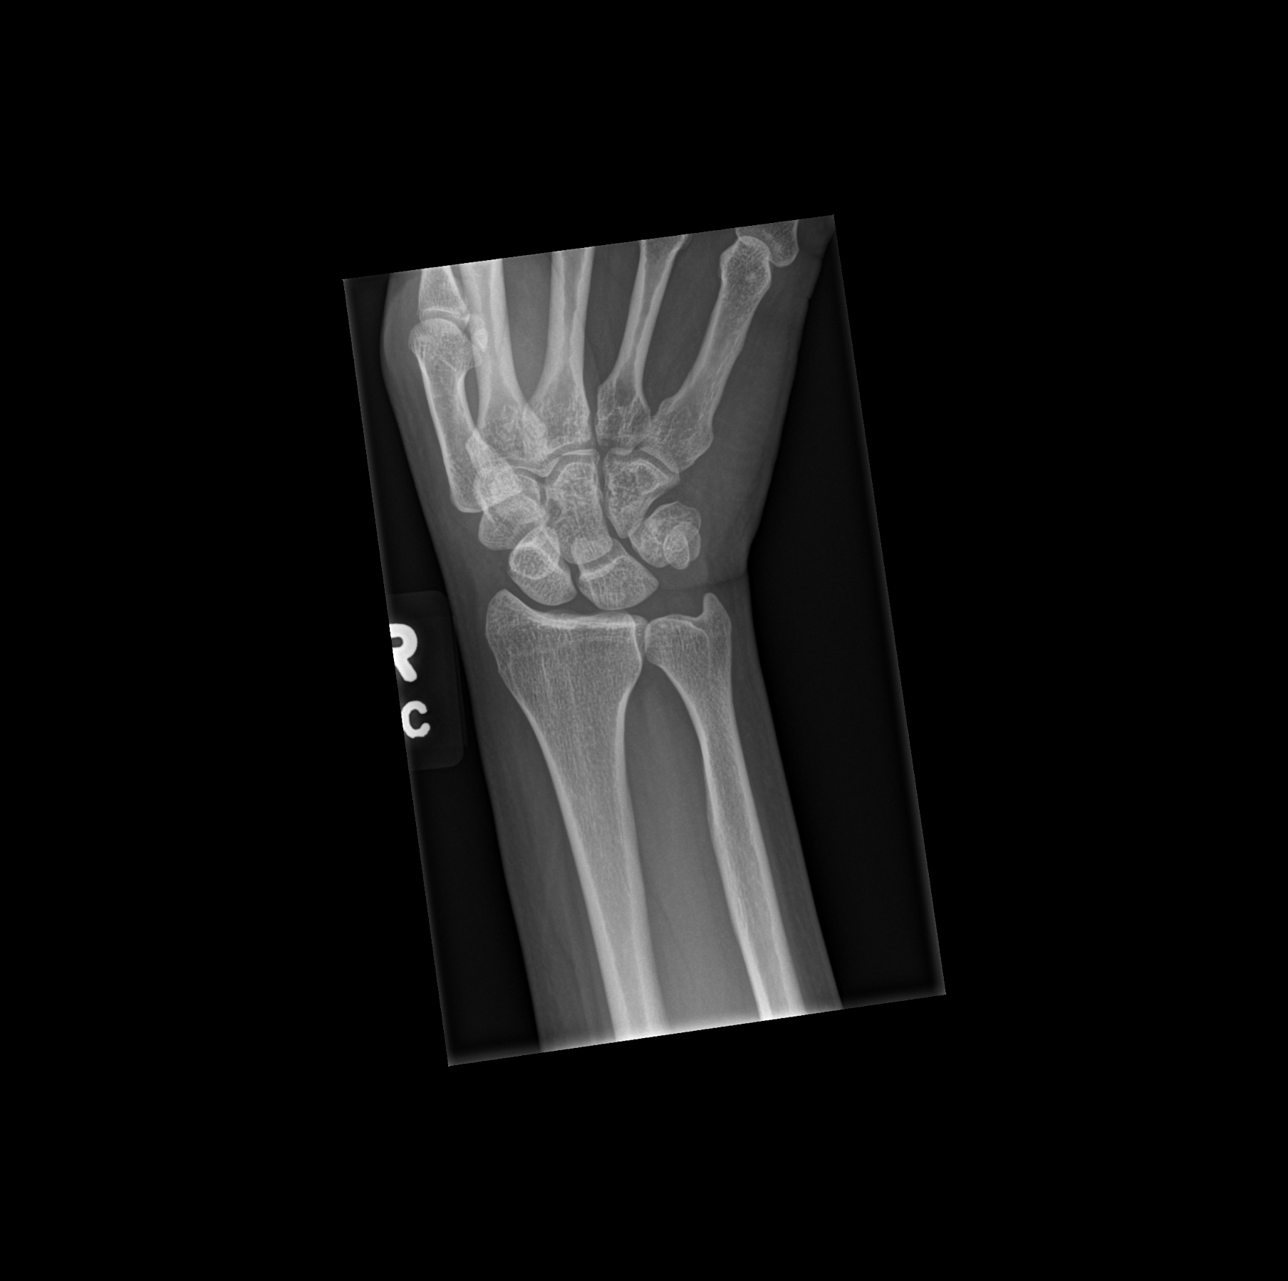

[x wrist obl right]
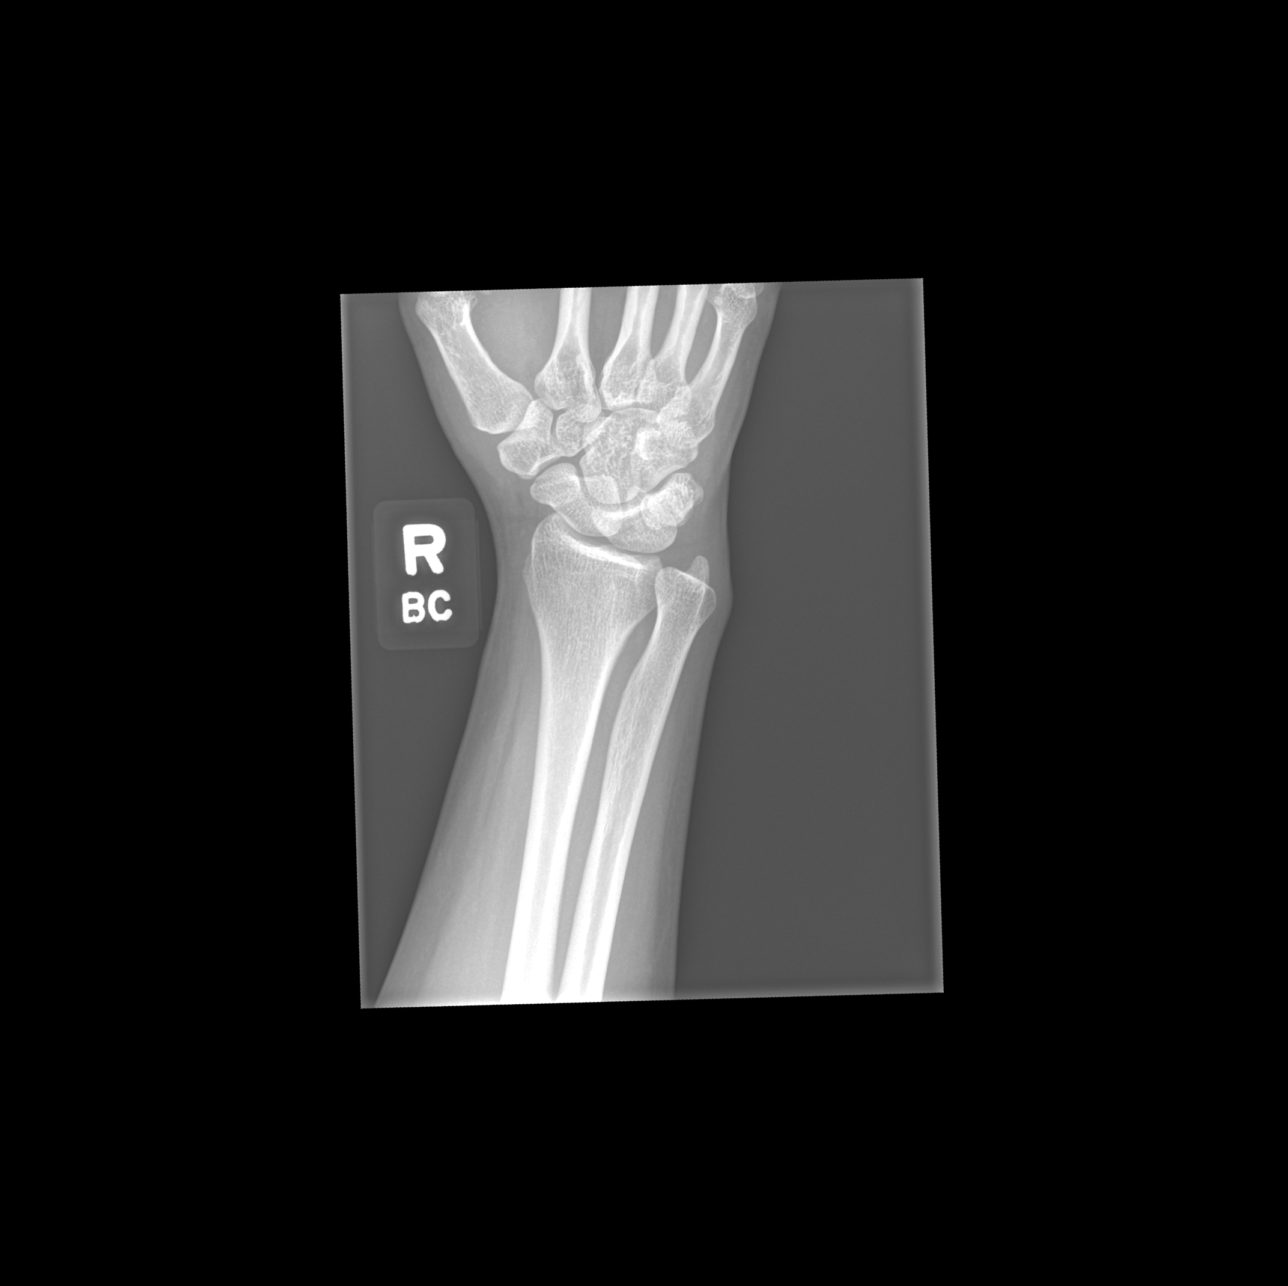

[3 of 3 positions shown; findings below may reference images not displayed]

FINDINGS: There is no evidence of fracture or dislocation. There is no
evidence of arthropathy or other focal bone abnormality. Soft
tissues are unremarkable.
IMPRESSION: Negative.

## 2016-06-20 IMAGING — MR MR HEAD WO/W CM
12 of 13 series · 40 of 48 positions shown · IV contrast (multihance)
Comparison: CT head 10/21/2013.

CLINICAL DATA: Sudden onset of RIGHT hand loss of sensation and
strength. Intermittent RIGHT facial droop with slurred speech.

EXAM:
MRI HEAD WITHOUT AND WITH CONTRAST
TECHNIQUE: Multiplanar, multiecho pulse sequences of the brain and surrounding
structures were obtained without and with intravenous contrast.
CONTRAST:  14mL MULTIHANCE GADOBENATE DIMEGLUMINE 529 MG/ML IV SOLN
BUN and creatinine were obtained on site at [HOSPITAL] at
[HOSPITAL].
Results:  BUN 13.0 mg/dL,  Creatinine 0.7 mg/dL.

[Series 2: T1 · sagittal · 5.0mm · 0.45mm/px · 2 of 20 slices shown]
[im 1/20]
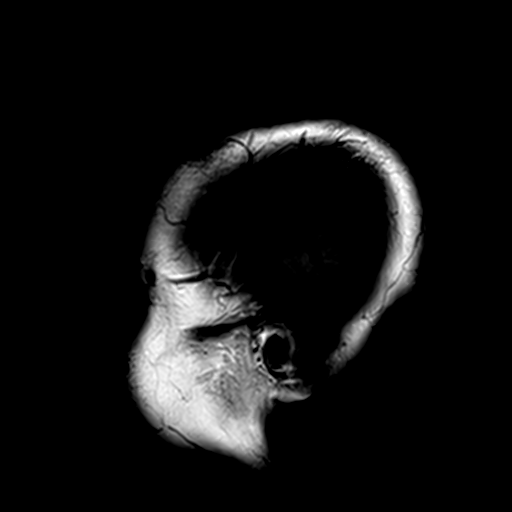
[im 20/20]
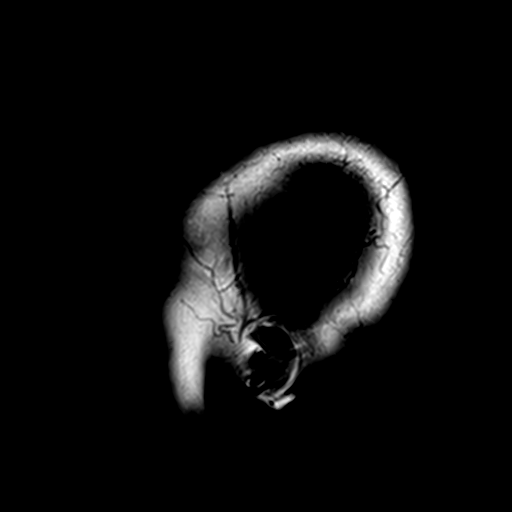

[Series 3: DWI · axial · 5.0mm · 1.80mm/px · z∈[-79,+56]mm · 4 of 44 slices shown (1 of 4)]
[im 1/44]
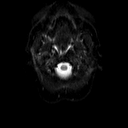
[im 15/44]
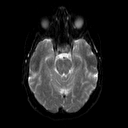
[im 29/44]
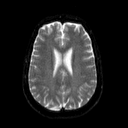
[im 44/44]
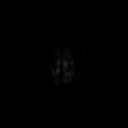

[Series 4: DWI · axial · 5.0mm · 1.80mm/px · z∈[-79,+56]mm · 2 of 21 slices shown (2 of 4)]
[im 1/21]
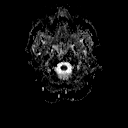
[im 21/21]
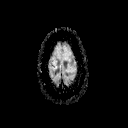

[Series 6: swi_images · axial · 2.0mm · 0.90mm/px · z∈[-90,+67]mm · 7 of 80 slices shown]
[im 1/80]
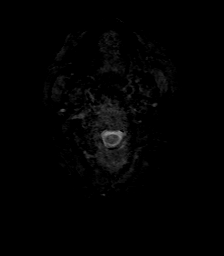
[im 14/80]
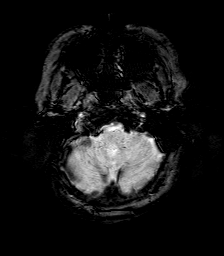
[im 27/80]
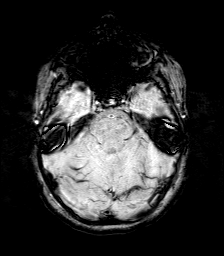
[im 40/80]
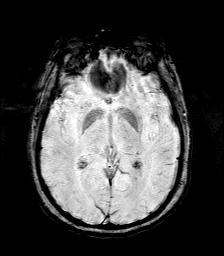
[im 53/80]
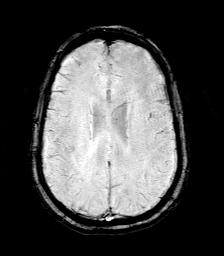
[im 66/80]
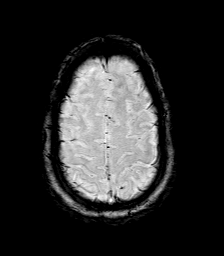
[im 80/80]
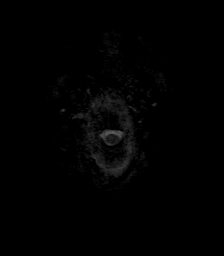

[Series 7: DWI · coronal · 5.0mm · 1.80mm/px · 6 of 60 slices shown (3 of 4)]
[im 1/60]
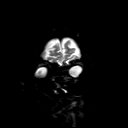
[im 12/60]
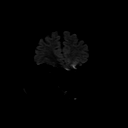
[im 24/60]
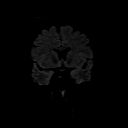
[im 36/60]
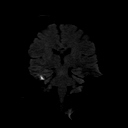
[im 48/60]
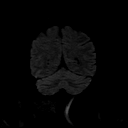
[im 60/60]
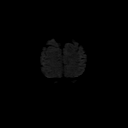

[Series 8: DWI · coronal · 5.0mm · 1.80mm/px · 3 of 30 slices shown (4 of 4)]
[im 1/30]
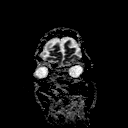
[im 15/30]
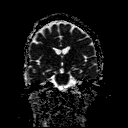
[im 30/30]
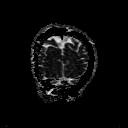

[Series 9: T2 · axial · 5.0mm · 0.45mm/px · z∈[-71,+72]mm · 2 of 23 slices shown (1 of 2)]
[im 1/23]
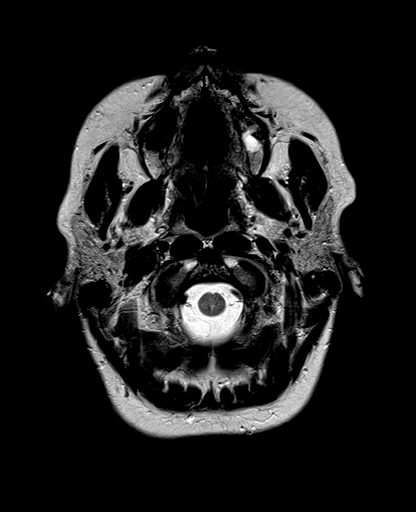
[im 23/23]
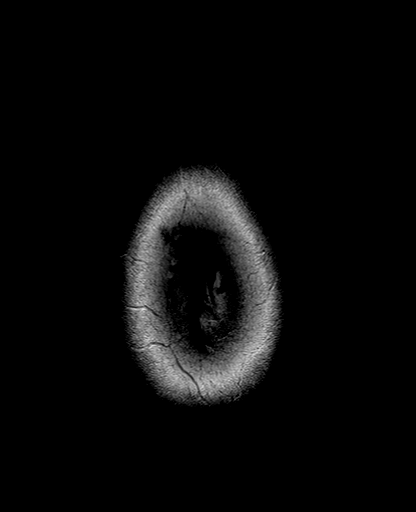

[Series 10: FLAIR · axial · 5.0mm · 0.45mm/px · z∈[-71,+72]mm · 2 of 23 slices shown (1 of 2)]
[im 1/23]
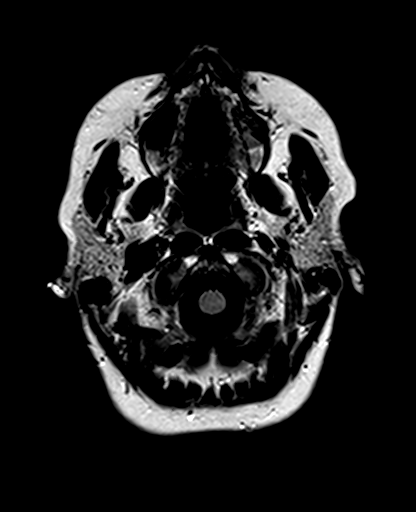
[im 23/23]
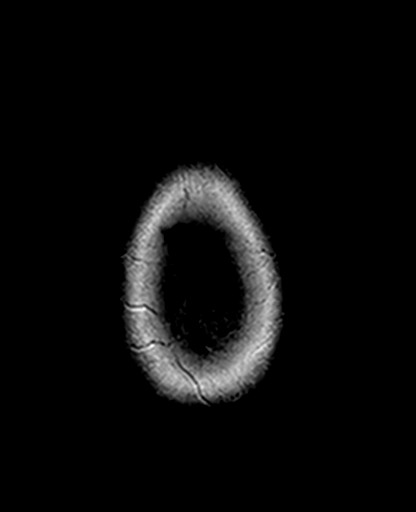

[Series 11: mpr tra · axial · 2.0mm · 0.45mm/px · z∈[-78,+79]mm · 7 of 80 slices shown]
[im 1/80]
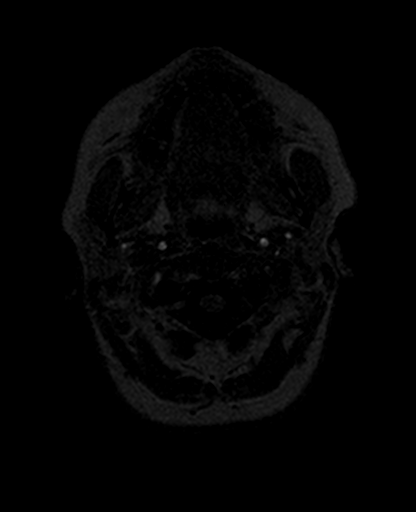
[im 14/80]
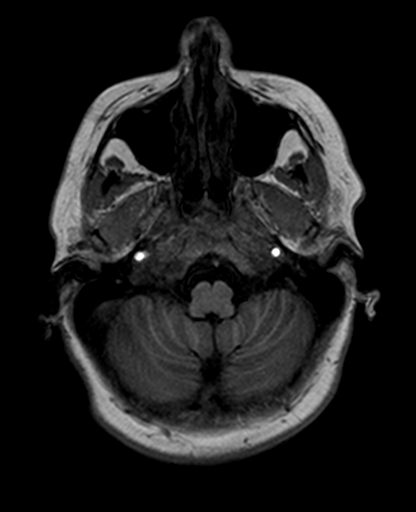
[im 27/80]
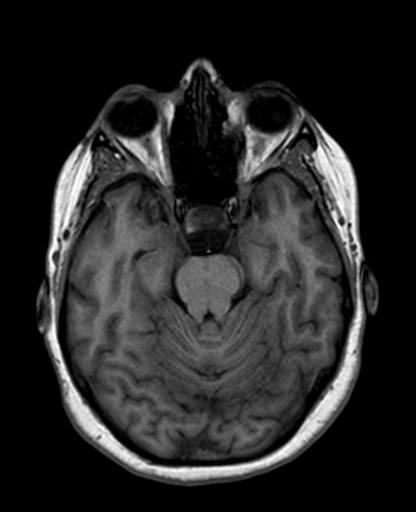
[im 40/80]
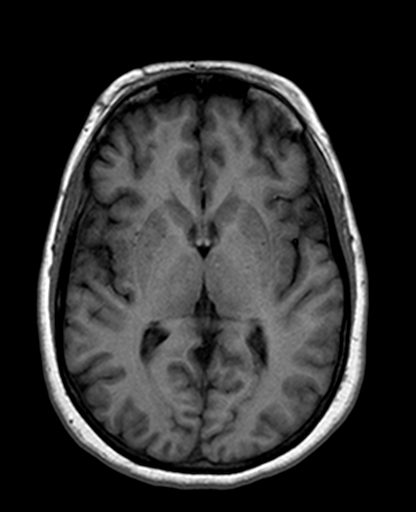
[im 53/80]
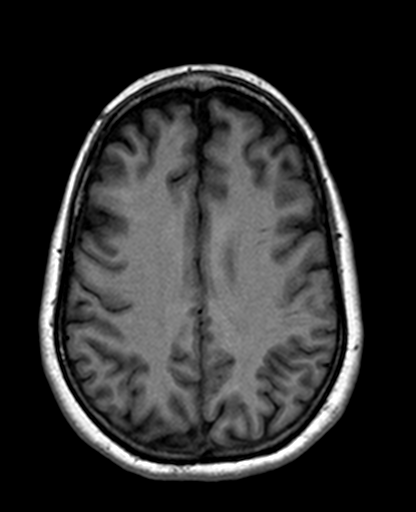
[im 66/80]
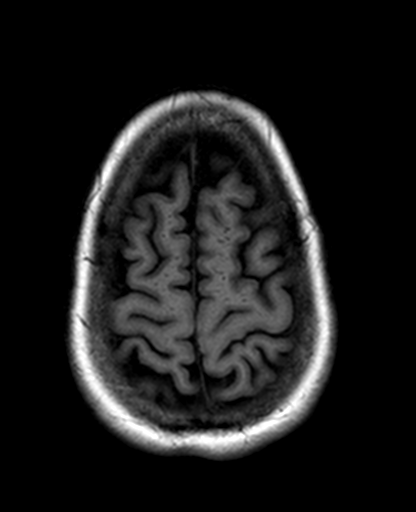
[im 80/80]
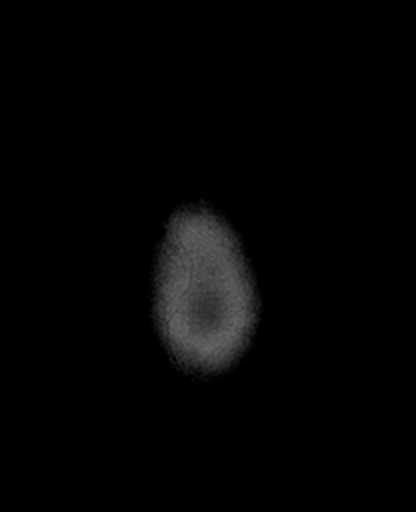

[Series 12: FLAIR · sagittal · 5.0mm · 0.45mm/px · 2 of 20 slices shown (2 of 2)]
[im 1/20]
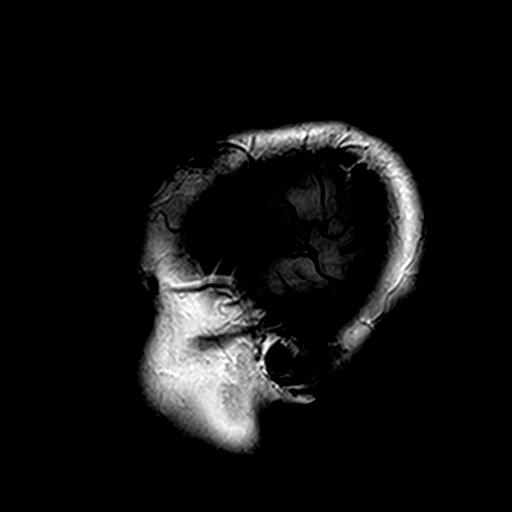
[im 20/20]
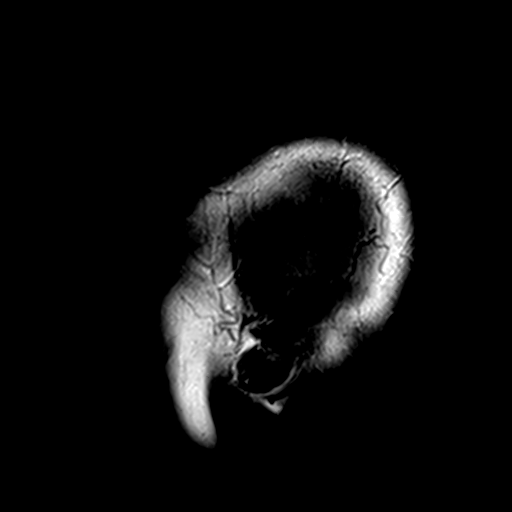

[Series 13: T2 · coronal · 5.0mm · 0.45mm/px · 2 of 20 slices shown (2 of 2)]
[im 1/20]
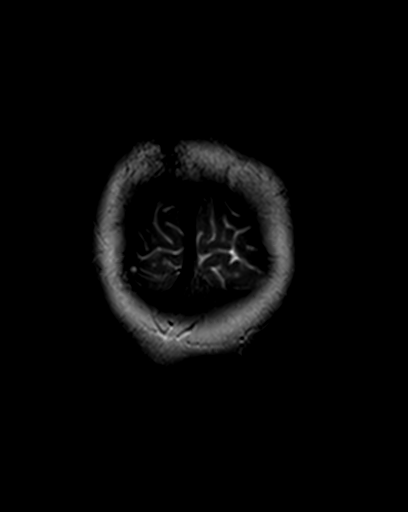
[im 20/20]
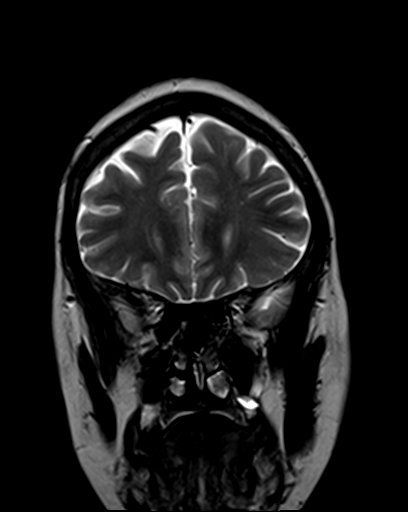

[Series 14: post mpr tra · axial · 2.0mm · 0.45mm/px · 1 of 80 slices shown]
[im 1/80]
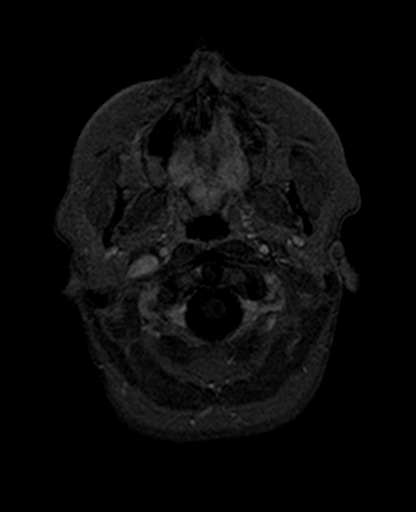

[40 of 48 positions shown; findings below may reference images not displayed]

FINDINGS: No evidence for acute infarction, hemorrhage, mass lesion,
hydrocephalus, or extra-axial fluid. Normal cerebral volume. No
white matter disease. Pituitary, pineal, and cerebellar tonsils
unremarkable. No upper cervical lesions. Flow voids are maintained
throughout the carotid, basilar, and vertebral arteries. There are
no areas of chronic hemorrhage.

Post infusion, no abnormal enhancement of the brain or meninges.
Visualized calvarium, skull base, and upper cervical osseous
structures unremarkable. Scalp and extracranial soft tissues,
orbits, sinuses, and mastoids show no acute process.

There is a moderate-sized retention cyst in the sphenoid sinus,
within the larger LEFT division. This appears similar to prior CT
and is consistent with chronic sinus disease. No evidence for
mucocele formation or intracranial complication. Chronically
deformed nasal bones.
IMPRESSION: Unremarkable cranial MRI. Unchanged appearance compared with prior
CT. No acute or focal intracranial abnormality.

Chronic sphenoid sinusitis.

## 2016-07-18 ENCOUNTER — Other Ambulatory Visit: Payer: Self-pay | Admitting: *Deleted

## 2016-07-19 ENCOUNTER — Other Ambulatory Visit: Payer: Self-pay | Admitting: *Deleted

## 2016-07-20 MED ORDER — HYDROCHLOROTHIAZIDE 12.5 MG PO TABS: 12.5000 mg | ORAL_TABLET | Freq: Every day | ORAL | 0 refills | Status: DC

## 2016-07-20 MED ORDER — METOPROLOL SUCCINATE ER 25 MG PO TB24: 25.0000 mg | ORAL_TABLET | Freq: Every day | ORAL | 0 refills | Status: DC

## 2016-07-20 NOTE — Telephone Encounter (Signed)
Please let patient know she needs to be seen before I can refill these medications. I will give her 1 month of refills to make an appointment

## 2016-07-21 NOTE — Telephone Encounter (Signed)
Called patient x 2, no answer, no voicemail. 

## 2016-08-19 ENCOUNTER — Emergency Department (HOSPITAL_COMMUNITY): Payer: Medicaid Other

## 2016-08-19 ENCOUNTER — Emergency Department (HOSPITAL_COMMUNITY)
Admission: EM | Admit: 2016-08-19 | Discharge: 2016-08-19 | Disposition: A | Discharge status: Home / Self Care | Payer: Medicaid Other | Attending: Emergency Medicine | Admitting: Emergency Medicine

## 2016-08-19 ENCOUNTER — Encounter (HOSPITAL_COMMUNITY): Payer: Self-pay | Admitting: Emergency Medicine

## 2016-08-19 DIAGNOSIS — Y929 Unspecified place or not applicable: Secondary | ICD-10-CM | POA: Insufficient documentation

## 2016-08-19 DIAGNOSIS — I10 Essential (primary) hypertension: Secondary | ICD-10-CM | POA: Insufficient documentation

## 2016-08-19 DIAGNOSIS — Y998 Other external cause status: Secondary | ICD-10-CM | POA: Insufficient documentation

## 2016-08-19 DIAGNOSIS — F17210 Nicotine dependence, cigarettes, uncomplicated: Secondary | ICD-10-CM | POA: Insufficient documentation

## 2016-08-19 DIAGNOSIS — J449 Chronic obstructive pulmonary disease, unspecified: Secondary | ICD-10-CM | POA: Insufficient documentation

## 2016-08-19 DIAGNOSIS — S0083XA Contusion of other part of head, initial encounter: Secondary | ICD-10-CM | POA: Insufficient documentation

## 2016-08-19 DIAGNOSIS — S0990XA Unspecified injury of head, initial encounter: Secondary | ICD-10-CM | POA: Insufficient documentation

## 2016-08-19 DIAGNOSIS — Y939 Activity, unspecified: Secondary | ICD-10-CM | POA: Insufficient documentation

## 2016-08-19 DIAGNOSIS — Z79899 Other long term (current) drug therapy: Secondary | ICD-10-CM | POA: Insufficient documentation

## 2016-08-19 MED ORDER — IBUPROFEN 800 MG PO TABS
800.0000 mg | ORAL_TABLET | Freq: Once | ORAL | Status: AC
  Administered 2016-08-19: 800 mg via ORAL
  Filled 2016-08-19: qty 1
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
## 2016-08-19 NOTE — Discharge Instructions (Signed)
You may alternate Tylenol 1000 mg every 6 hours as needed for pain and Ibuprofen 800 mg every 8 hours as needed for pain.  Please take Ibuprofen with food. ° ° ° °To find a primary care or specialty doctor please call [phone] or [phone] to access "[place] Find a Doctor Service." ° °You may also go on the [facility] website at [email or web address] ° °There are also multiple [facility], [facility], [facility] and [facility] practices throughout the [place] that are frequently accepting new patients. You may find a clinic that is close to your home and contact them. ° °[facility] -  °[address] °[place] [address] °[phone] ° ° °[facility] -  °[address] °[place] [address] [phone] ° ° °[facility] - °[address]  °[place] [address] °[phone] ° ° °
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
## 2016-08-19 NOTE — ED Notes (Signed)
Dr. Ward at bedside evaluating pt .  

## 2016-08-25 ENCOUNTER — Other Ambulatory Visit: Payer: Self-pay | Admitting: *Deleted

## 2016-08-26 MED ORDER — HYDROCHLOROTHIAZIDE 12.5 MG PO TABS: 12.5000 mg | ORAL_TABLET | Freq: Every day | ORAL | 0 refills | Status: DC

## 2016-08-28 IMAGING — CR DG TOE GREAT 2+V*R*
3 series · 3 of 3 positions shown · non-contrast
Comparison: None.

CLINICAL DATA: Patient dropped ceramic Ash tray on to big toe this
morning, pain

EXAM:
RIGHT GREAT TOE

[t toes ap right]
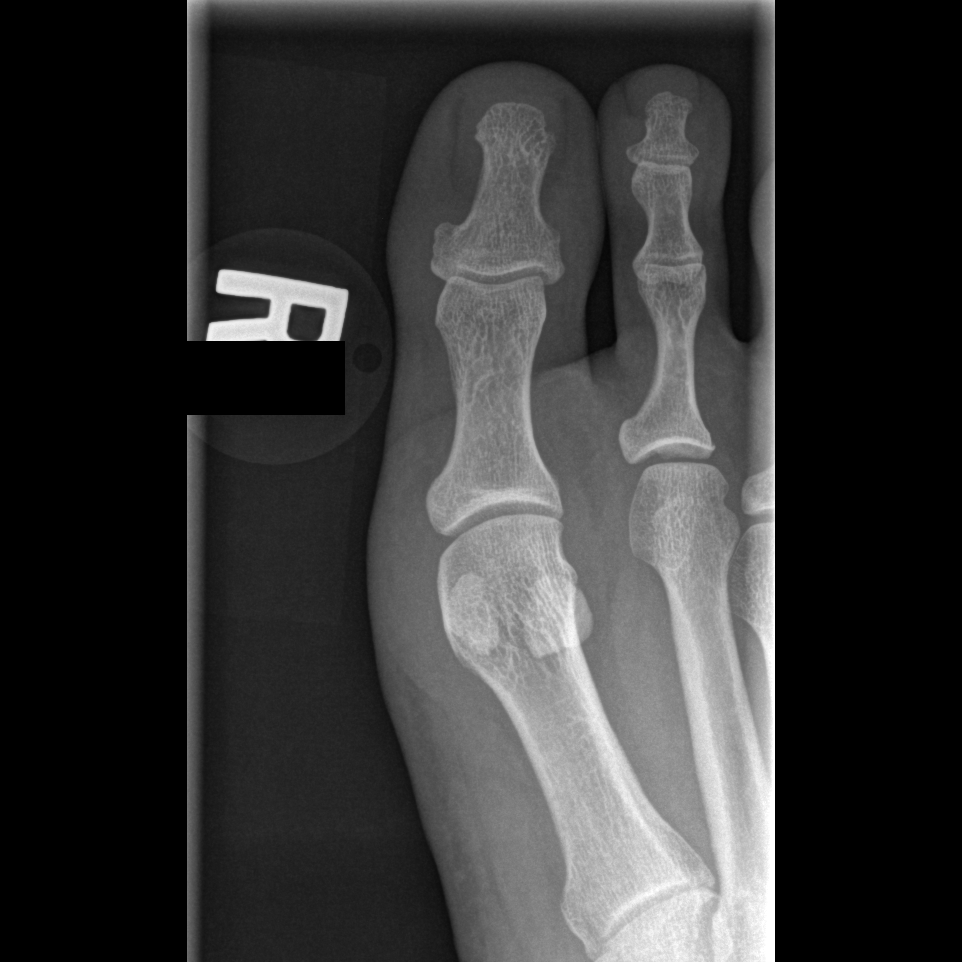

[t toes oblique right]
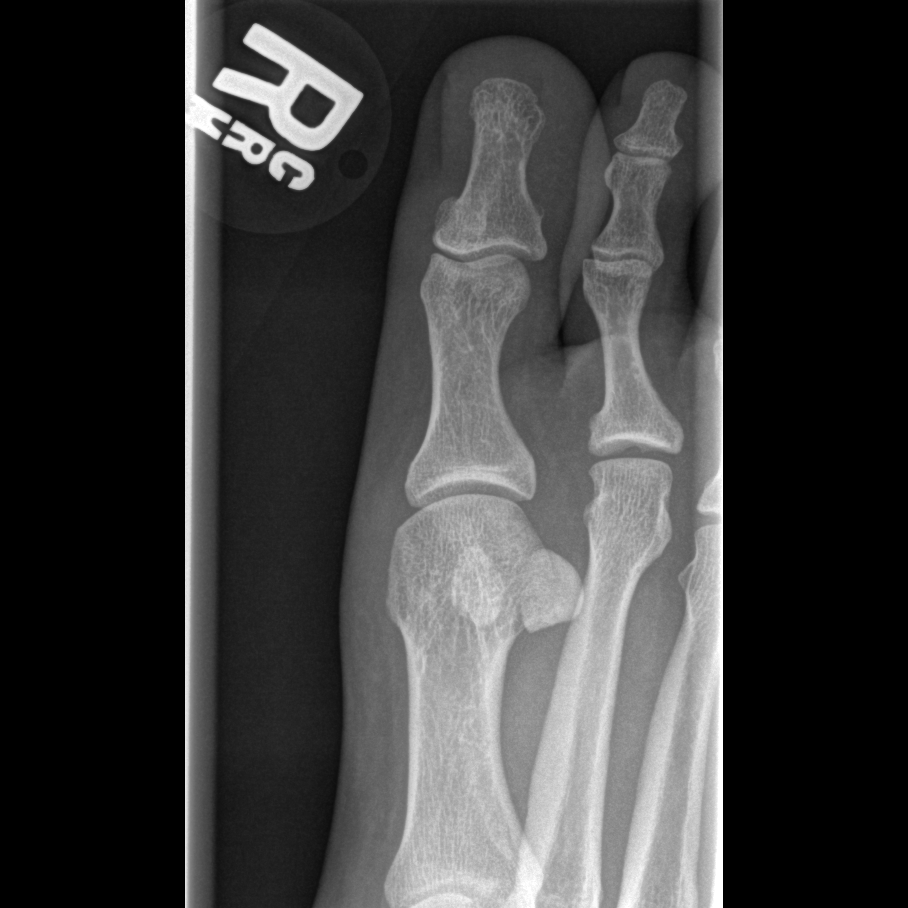

[t toes lateral right]
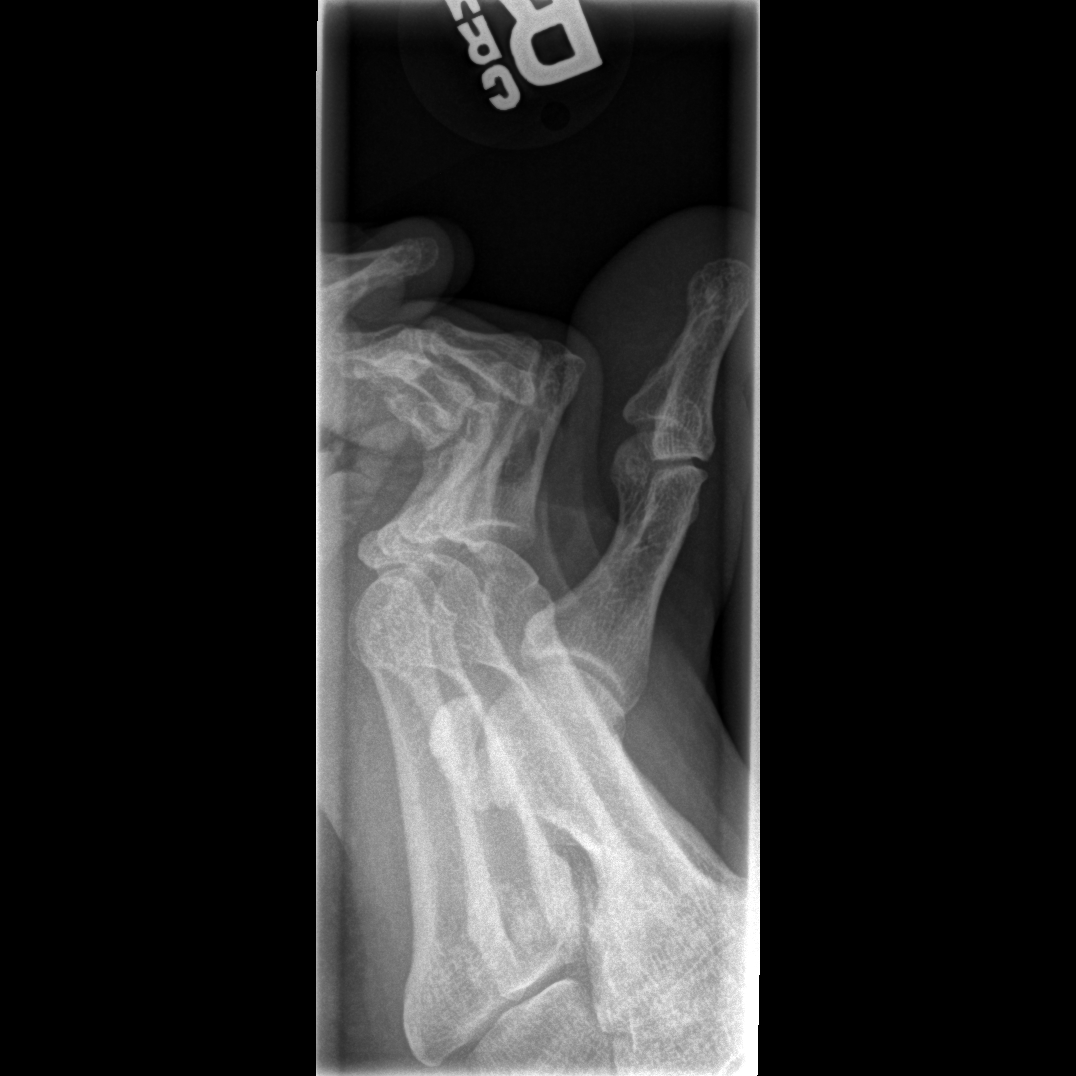

[3 of 3 positions shown; findings below may reference images not displayed]

FINDINGS: There is no evidence of fracture or dislocation. There is no
evidence of arthropathy or other focal bone abnormality. Soft
tissues are unremarkable.
IMPRESSION: No acute osseous injury of the right great toe.

## 2016-09-02 ENCOUNTER — Other Ambulatory Visit: Payer: Self-pay | Admitting: *Deleted

## 2016-09-05 MED ORDER — METOPROLOL SUCCINATE ER 25 MG PO TB24: 25.0000 mg | ORAL_TABLET | Freq: Every day | ORAL | 0 refills | Status: DC

## 2016-10-15 ENCOUNTER — Emergency Department (HOSPITAL_COMMUNITY): Payer: Self-pay

## 2016-10-15 ENCOUNTER — Encounter (HOSPITAL_COMMUNITY): Payer: Self-pay

## 2016-10-15 ENCOUNTER — Emergency Department (HOSPITAL_COMMUNITY)
Admission: EM | Admit: 2016-10-15 | Discharge: 2016-10-15 | Disposition: A | Discharge status: Home / Self Care | Payer: Self-pay | Attending: Emergency Medicine | Admitting: Emergency Medicine

## 2016-10-15 DIAGNOSIS — Y939 Activity, unspecified: Secondary | ICD-10-CM | POA: Insufficient documentation

## 2016-10-15 DIAGNOSIS — Z79899 Other long term (current) drug therapy: Secondary | ICD-10-CM | POA: Insufficient documentation

## 2016-10-15 DIAGNOSIS — M542 Cervicalgia: Secondary | ICD-10-CM | POA: Insufficient documentation

## 2016-10-15 DIAGNOSIS — F17210 Nicotine dependence, cigarettes, uncomplicated: Secondary | ICD-10-CM | POA: Insufficient documentation

## 2016-10-15 DIAGNOSIS — T148XXA Other injury of unspecified body region, initial encounter: Secondary | ICD-10-CM | POA: Insufficient documentation

## 2016-10-15 DIAGNOSIS — S0990XA Unspecified injury of head, initial encounter: Secondary | ICD-10-CM | POA: Insufficient documentation

## 2016-10-15 DIAGNOSIS — I10 Essential (primary) hypertension: Secondary | ICD-10-CM | POA: Insufficient documentation

## 2016-10-15 DIAGNOSIS — B182 Chronic viral hepatitis C: Secondary | ICD-10-CM | POA: Insufficient documentation

## 2016-10-15 DIAGNOSIS — Y92009 Unspecified place in unspecified non-institutional (private) residence as the place of occurrence of the external cause: Secondary | ICD-10-CM | POA: Insufficient documentation

## 2016-10-15 DIAGNOSIS — J449 Chronic obstructive pulmonary disease, unspecified: Secondary | ICD-10-CM | POA: Insufficient documentation

## 2016-10-15 DIAGNOSIS — J45909 Unspecified asthma, uncomplicated: Secondary | ICD-10-CM | POA: Insufficient documentation

## 2016-10-15 DIAGNOSIS — T07XXXA Unspecified multiple injuries, initial encounter: Secondary | ICD-10-CM

## 2016-10-15 DIAGNOSIS — Y999 Unspecified external cause status: Secondary | ICD-10-CM | POA: Insufficient documentation

## 2016-10-15 MED ORDER — LORAZEPAM 1 MG PO TABS
1.0000 mg | ORAL_TABLET | Freq: Once | ORAL | Status: AC
  Administered 2016-10-15: 1 mg via ORAL
  Filled 2016-10-15: qty 1

## 2016-10-15 MED ORDER — LORAZEPAM 1 MG PO TABS
1.0000 mg | ORAL_TABLET | Freq: Once | ORAL | Status: AC
Start: 2016-10-15 — End: 2016-10-15
  Administered 2016-10-15: 1 mg via ORAL
  Filled 2016-10-15: qty 1

## 2016-10-15 MED ORDER — HYDROCODONE-ACETAMINOPHEN 5-325 MG PO TABS
1.0000 | ORAL_TABLET | Freq: Once | ORAL | Status: AC
  Administered 2016-10-15: 1 via ORAL
  Filled 2016-10-15: qty 1
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
## 2016-10-15 NOTE — ED Notes (Signed)
Pt seen walking down the hall towards emergency room entrance. Stead [facility]. NAD.

## 2016-10-15 NOTE — ED Notes (Signed)
Spoke with April at [facility] and was told that she got in touch with [facility] and they told her they were at capacity.  April says she will notify a deputy on the next shift and someone will be contacting us.

## 2016-10-15 NOTE — ED Notes (Addendum)
Awaiting [facility] to call back about safe housing for patient
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
## 2016-10-15 NOTE — ED Notes (Signed)
The patient agreed to the dogs going to [facility] and then the shelter on Monday, the deputy is going to come get the patient and take her to [facility] as she still has a bed there after talking to the director there.
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
## 2016-10-24 IMAGING — DX DG RIBS W/ CHEST 3+V*L*
3 series · 3 of 3 positions shown · non-contrast
Comparison: No prior rib imaging.

CLINICAL DATA: Fell yesterday, injuring the left posterior ribs.
Initial encounter.

EXAM:
LEFT RIBS AND CHEST - 3+ VIEW

[chest pa]
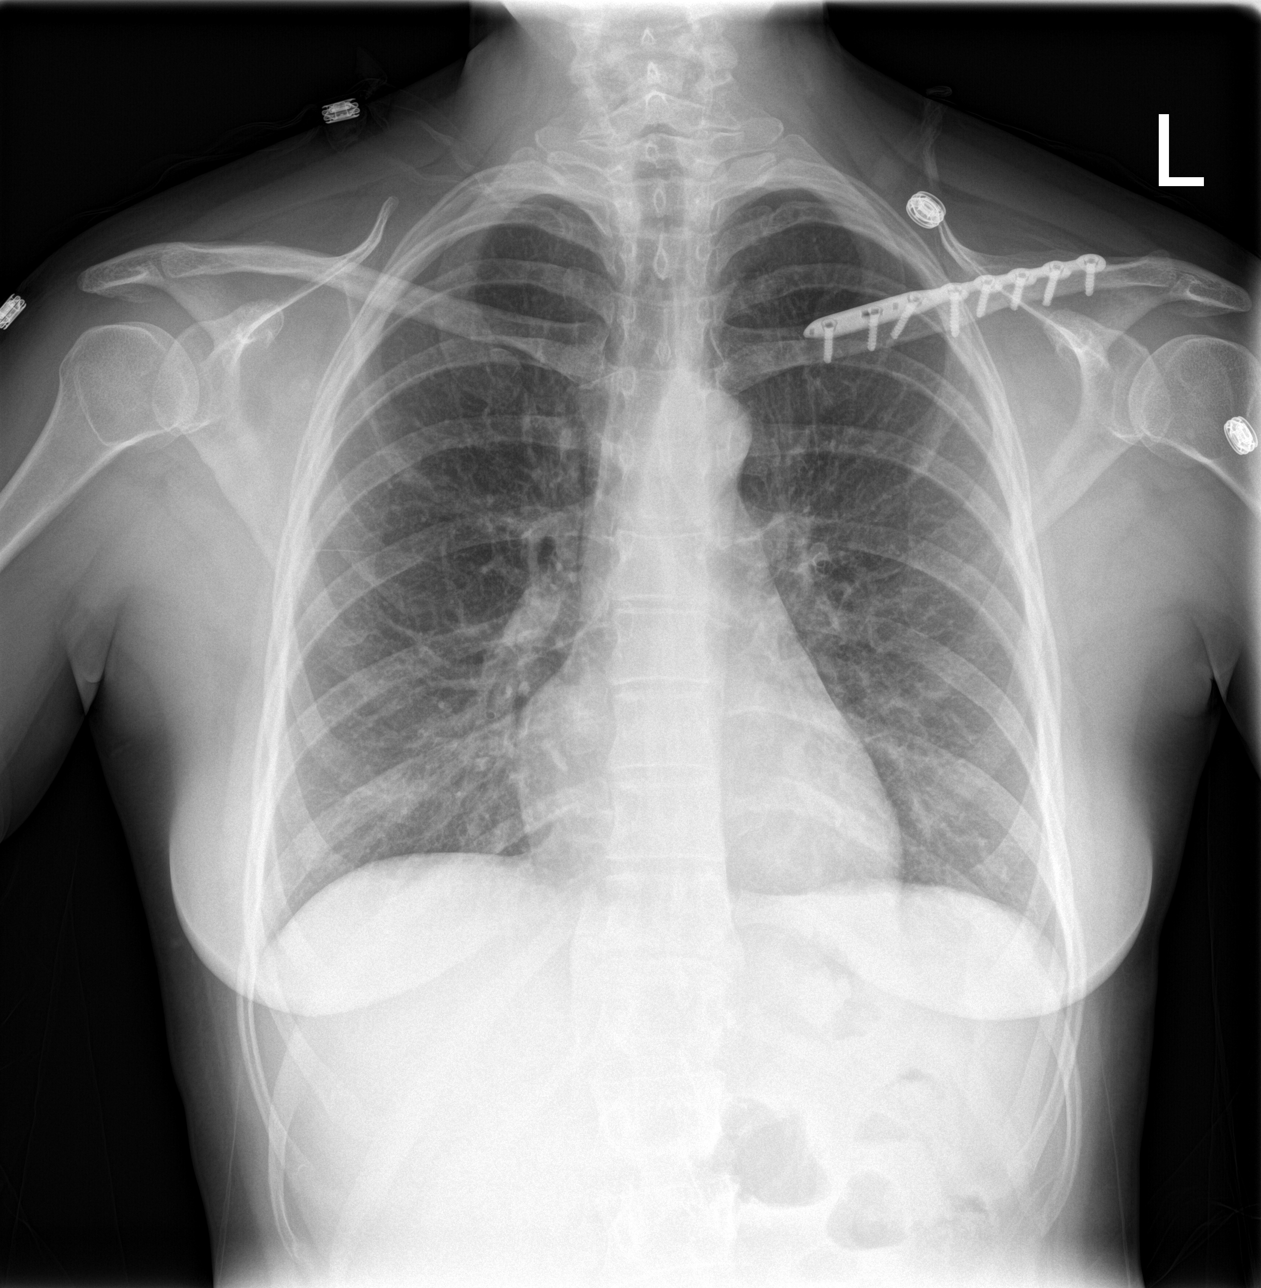

[rib ap]
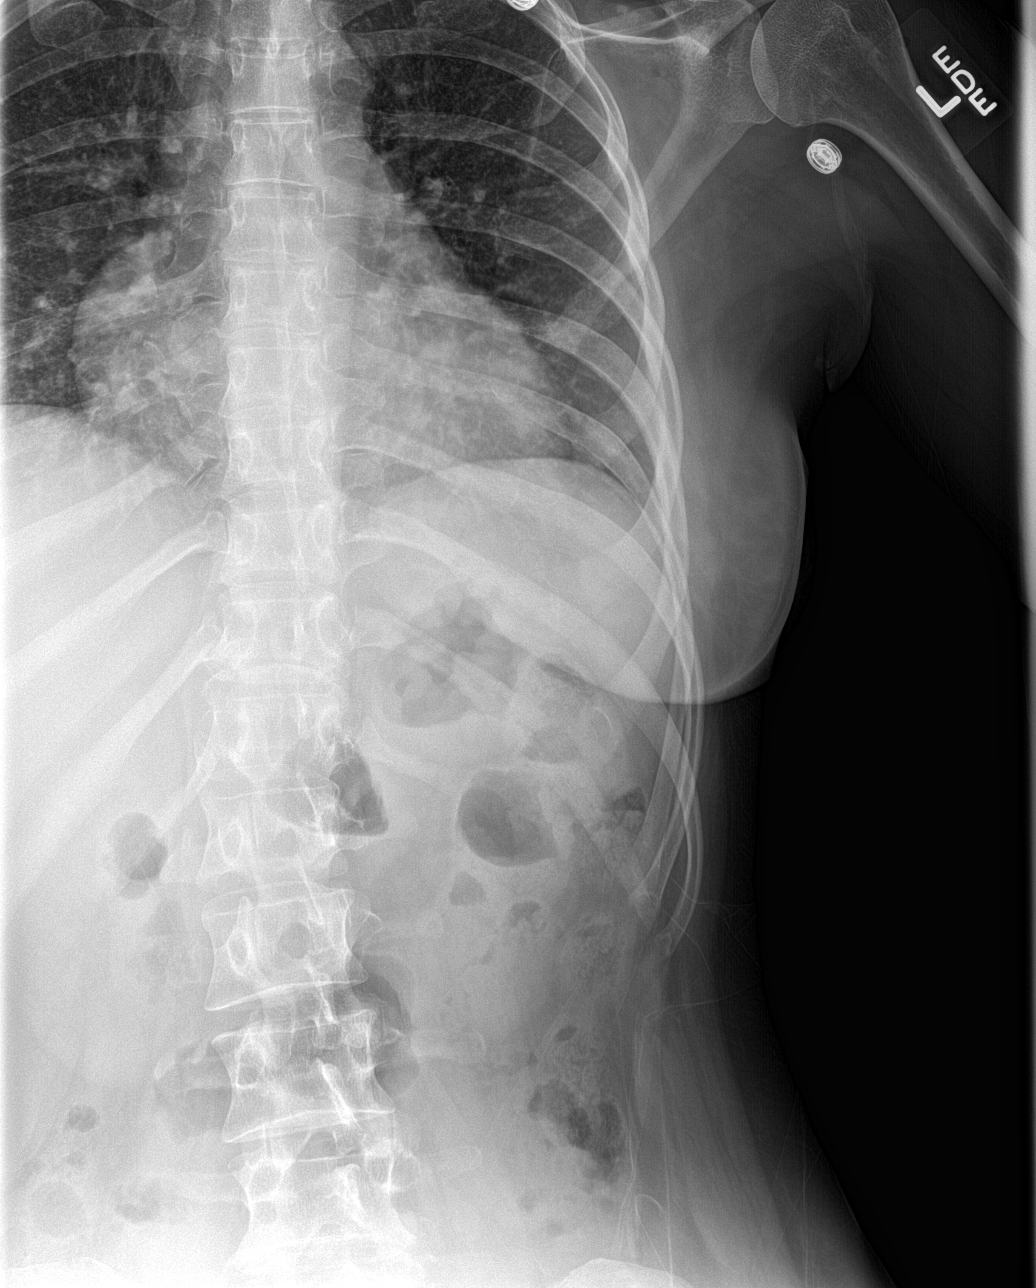

[rib ap obl]
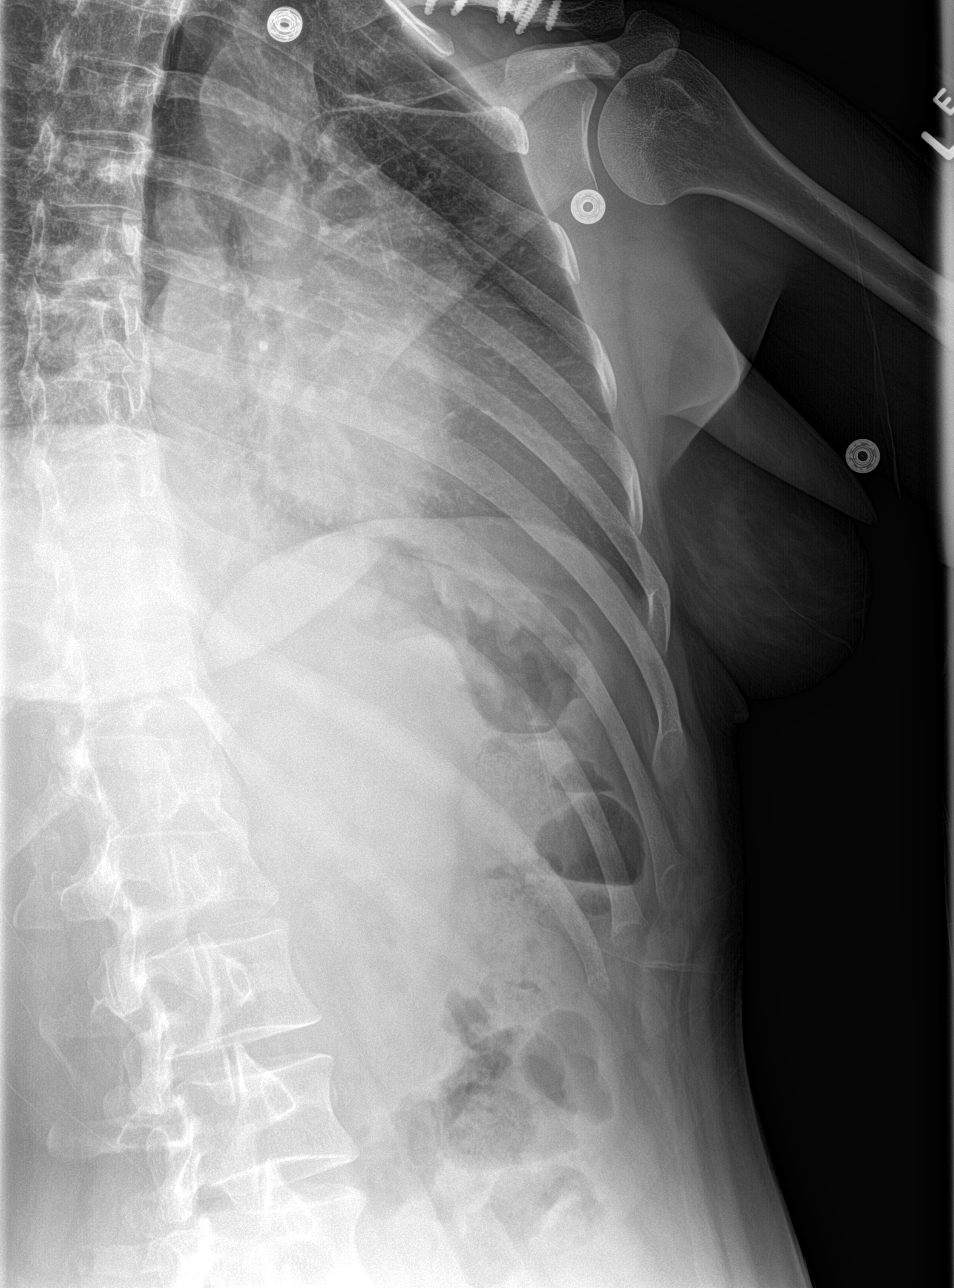

[3 of 3 positions shown; findings below may reference images not displayed]

Two-view chest x-ray 09/23/2013,
06/05/2013. Visualized lower chest on CT abdomen and pelvis
04/15/2013.
FINDINGS: No acute fractures identified involving the ribs. No intrinsic
osseous abnormality. Well preserved bone mineral density. Prior ORIF
of a midshaft left clavicle fracture with complete healing.

Cardiomediastinal silhouette unremarkable. Lungs clear.
Bronchovascular markings normal. Pulmonary vascularity normal. No
visible pleural effusions. No pneumothorax.
IMPRESSION: 1. No left rib fracture identified.
2.  No acute cardiopulmonary disease.
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
## 2016-12-14 IMAGING — CR DG RIBS W/ CHEST 3+V*R*
3 series · 3 of 3 positions shown · non-contrast
Comparison: left rib series of March 05, 2014.

CLINICAL DATA: Status post fall last evening, right chest wall pain
now.

EXAM:
RIGHT RIBS AND CHEST - 3+ VIEW

[w chest pa]
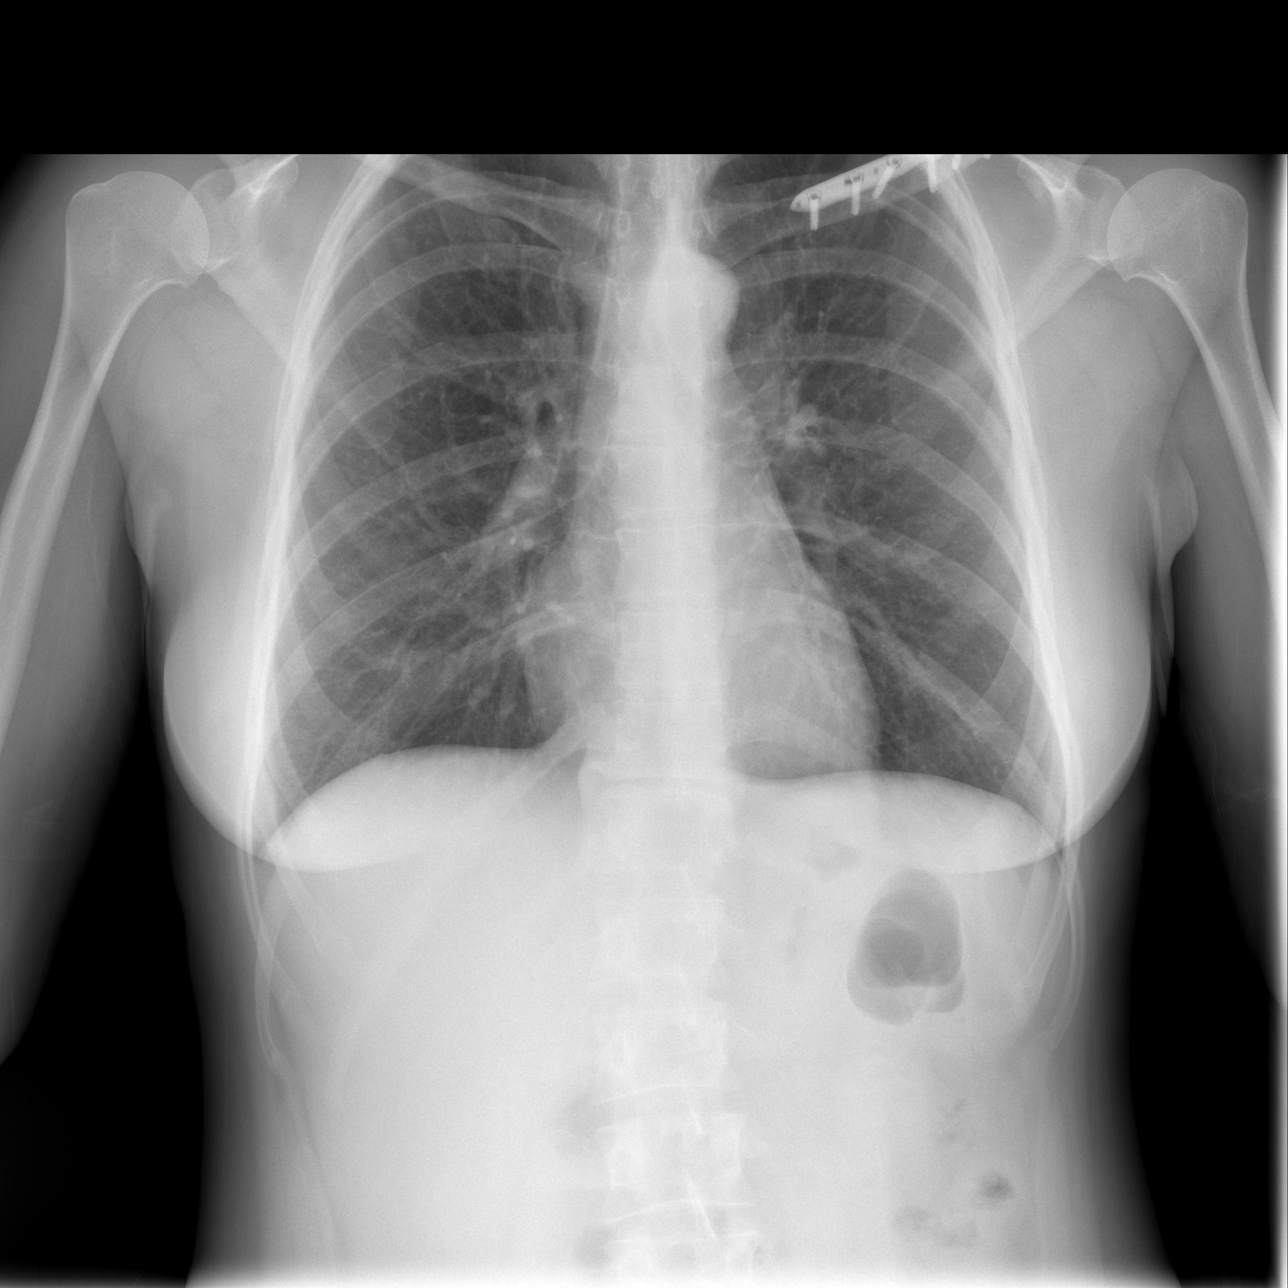

[w ribs ap/pa upper right]
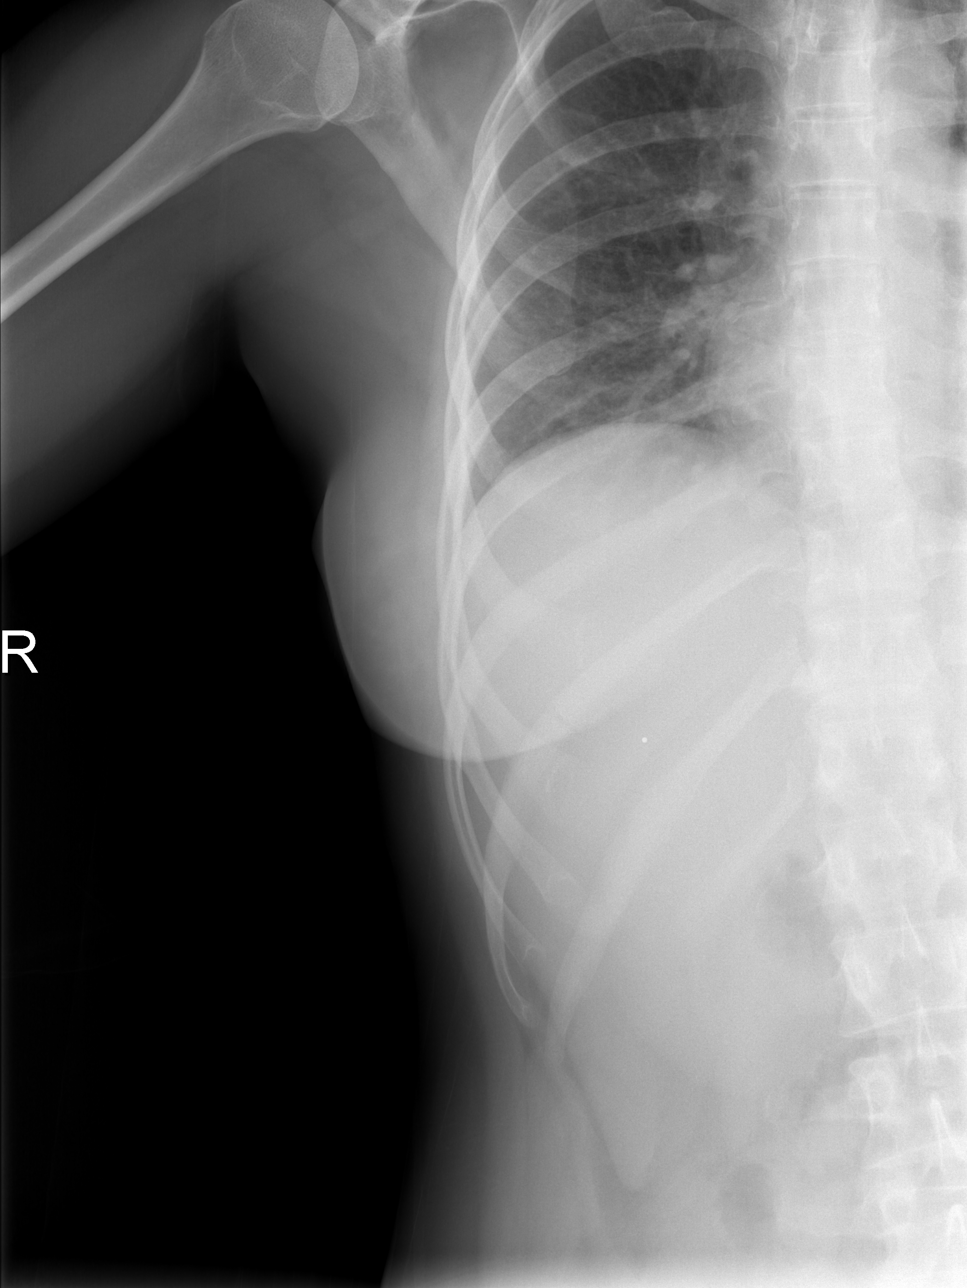

[w ribs ap/pa lower right]
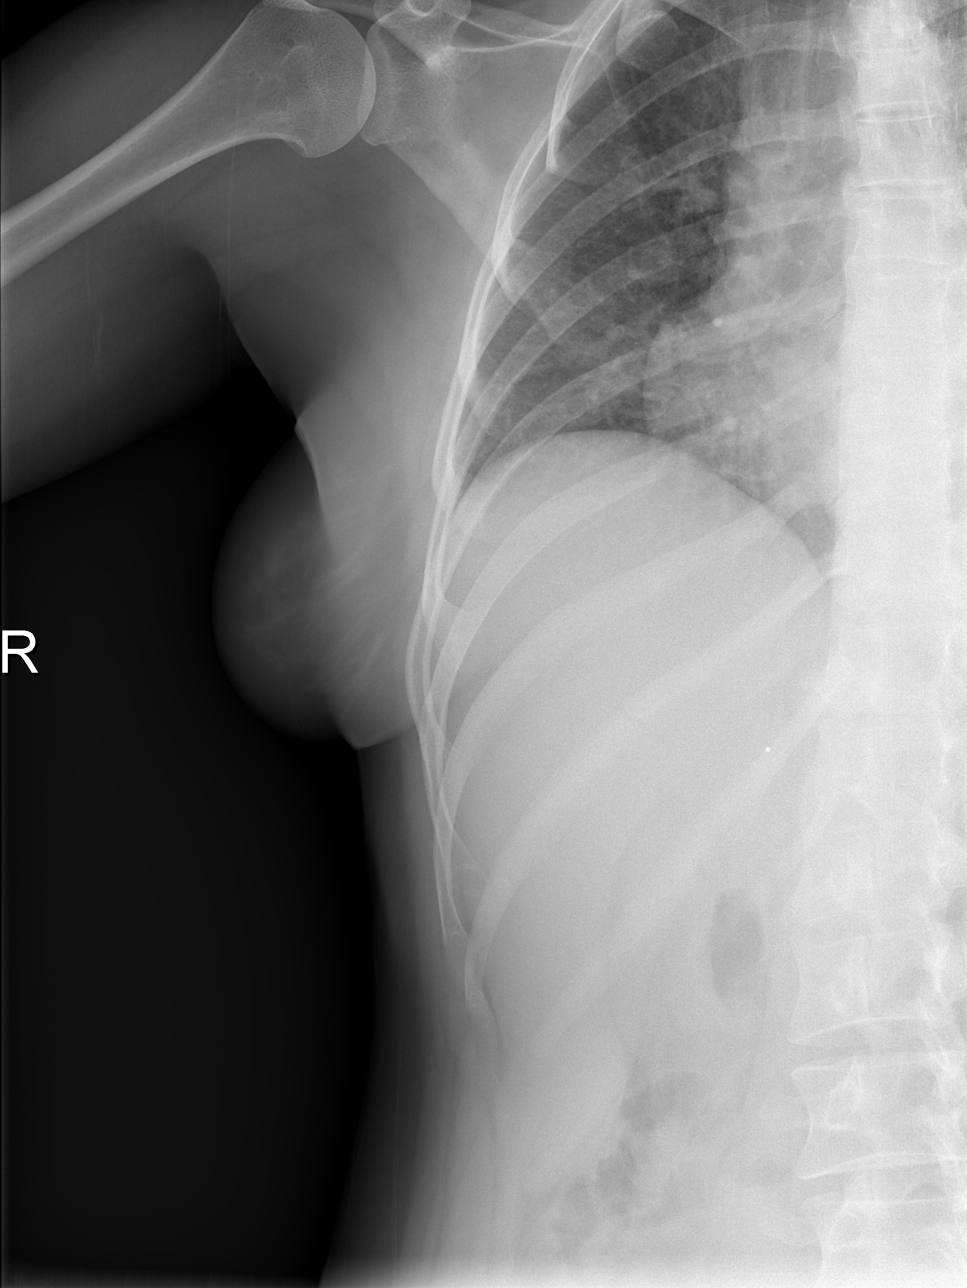

[3 of 3 positions shown; findings below may reference images not displayed]

FINDINGS: The lungs are well-expanded and clear. There is no pleural effusion
or pneumothorax. The heart and pulmonary vascularity are normal. The
mediastinum is normal in width. The patient has undergone previous
ORIF for a midshaft left clavicular fracture.

There is chronic expansile remodeling of the lateral aspect of the
right eighth rib. No acute right rib fracture is demonstrated. There
is chronic expansile remodeling of the lateral aspect of the right
eighth rib. There is no acute rib fracture nor dislocation.
IMPRESSION: There is no acute rib fracture demonstrated. There is no active
cardiopulmonary disease.

## 2016-12-18 ENCOUNTER — Emergency Department (HOSPITAL_COMMUNITY)
Admission: EM | Admit: 2016-12-18 | Discharge: 2016-12-18 | Disposition: A | Discharge status: Home / Self Care | Payer: Self-pay | Attending: Emergency Medicine | Admitting: Emergency Medicine

## 2016-12-18 ENCOUNTER — Encounter (HOSPITAL_COMMUNITY): Payer: Self-pay

## 2016-12-18 ENCOUNTER — Emergency Department (HOSPITAL_COMMUNITY): Payer: Self-pay

## 2016-12-18 DIAGNOSIS — M79644 Pain in right finger(s): Secondary | ICD-10-CM | POA: Insufficient documentation

## 2016-12-18 DIAGNOSIS — I10 Essential (primary) hypertension: Secondary | ICD-10-CM | POA: Insufficient documentation

## 2016-12-18 DIAGNOSIS — Y939 Activity, unspecified: Secondary | ICD-10-CM | POA: Insufficient documentation

## 2016-12-18 DIAGNOSIS — Y929 Unspecified place or not applicable: Secondary | ICD-10-CM | POA: Insufficient documentation

## 2016-12-18 DIAGNOSIS — Z91012 Allergy to eggs: Secondary | ICD-10-CM | POA: Insufficient documentation

## 2016-12-18 DIAGNOSIS — J449 Chronic obstructive pulmonary disease, unspecified: Secondary | ICD-10-CM | POA: Insufficient documentation

## 2016-12-18 DIAGNOSIS — Y999 Unspecified external cause status: Secondary | ICD-10-CM | POA: Insufficient documentation

## 2016-12-18 DIAGNOSIS — M25511 Pain in right shoulder: Secondary | ICD-10-CM

## 2016-12-18 DIAGNOSIS — G8929 Other chronic pain: Secondary | ICD-10-CM | POA: Insufficient documentation

## 2016-12-18 DIAGNOSIS — F17210 Nicotine dependence, cigarettes, uncomplicated: Secondary | ICD-10-CM | POA: Insufficient documentation

## 2016-12-18 DIAGNOSIS — S62629A Displaced fracture of medial phalanx of unspecified finger, initial encounter for closed fracture: Secondary | ICD-10-CM | POA: Insufficient documentation

## 2016-12-18 DIAGNOSIS — J45909 Unspecified asthma, uncomplicated: Secondary | ICD-10-CM | POA: Insufficient documentation

## 2016-12-18 MED ORDER — KETOROLAC TROMETHAMINE 60 MG/2ML IM SOLN
60.0000 mg | Freq: Once | INTRAMUSCULAR | Status: AC
  Administered 2016-12-18: 60 mg via INTRAMUSCULAR
  Filled 2016-12-18: qty 2

## 2016-12-18 MED ORDER — MELOXICAM 15 MG PO TABS: 15.0000 mg | ORAL_TABLET | Freq: Every day | ORAL | 0 refills | Status: DC

## 2016-12-18 MED ORDER — TRAMADOL HCL 50 MG PO TABS: 50.0000 mg | ORAL_TABLET | Freq: Four times a day (QID) | ORAL | 0 refills | Status: DC | PRN

## 2016-12-18 MED ORDER — BACLOFEN 20 MG PO TABS: 20.0000 mg | ORAL_TABLET | Freq: Three times a day (TID) | ORAL | 0 refills | Status: DC

## 2016-12-18 MED ORDER — ONDANSETRON 4 MG PO TBDP
4.0000 mg | ORAL_TABLET | Freq: Once | ORAL | Status: AC
  Administered 2016-12-18: 4 mg via ORAL
  Filled 2016-12-18: qty 1

## 2016-12-18 MED ORDER — OXYCODONE-ACETAMINOPHEN 5-325 MG PO TABS
1.0000 | ORAL_TABLET | Freq: Once | ORAL | Status: AC
  Administered 2016-12-18: 1 via ORAL
  Filled 2016-12-18: qty 1

## 2016-12-18 NOTE — Discharge Instructions (Signed)
Get help right away if: Your arm, hand, or fingers: Tingle. Become numb. Become swollen. Become painful. Turn white or blue. 
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
## 2016-12-18 NOTE — ED Notes (Signed)
Patient transported to X-ray 
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
## 2016-12-18 NOTE — ED Triage Notes (Signed)
Pt presents for evaluation of R arm pain x 1 month. Reports pain worse with movement and putting clothes on. Pt reports difficulty sleeping at night.
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
## 2017-07-09 IMAGING — CR DG CHEST 2V
2 series · 2 of 2 positions shown · non-contrast
Comparison: Chest radiograph 08/05/2014

CLINICAL DATA: Patient with left chest pain and shortness of
breath.

EXAM:
CHEST  2 VIEW

[chest pa]
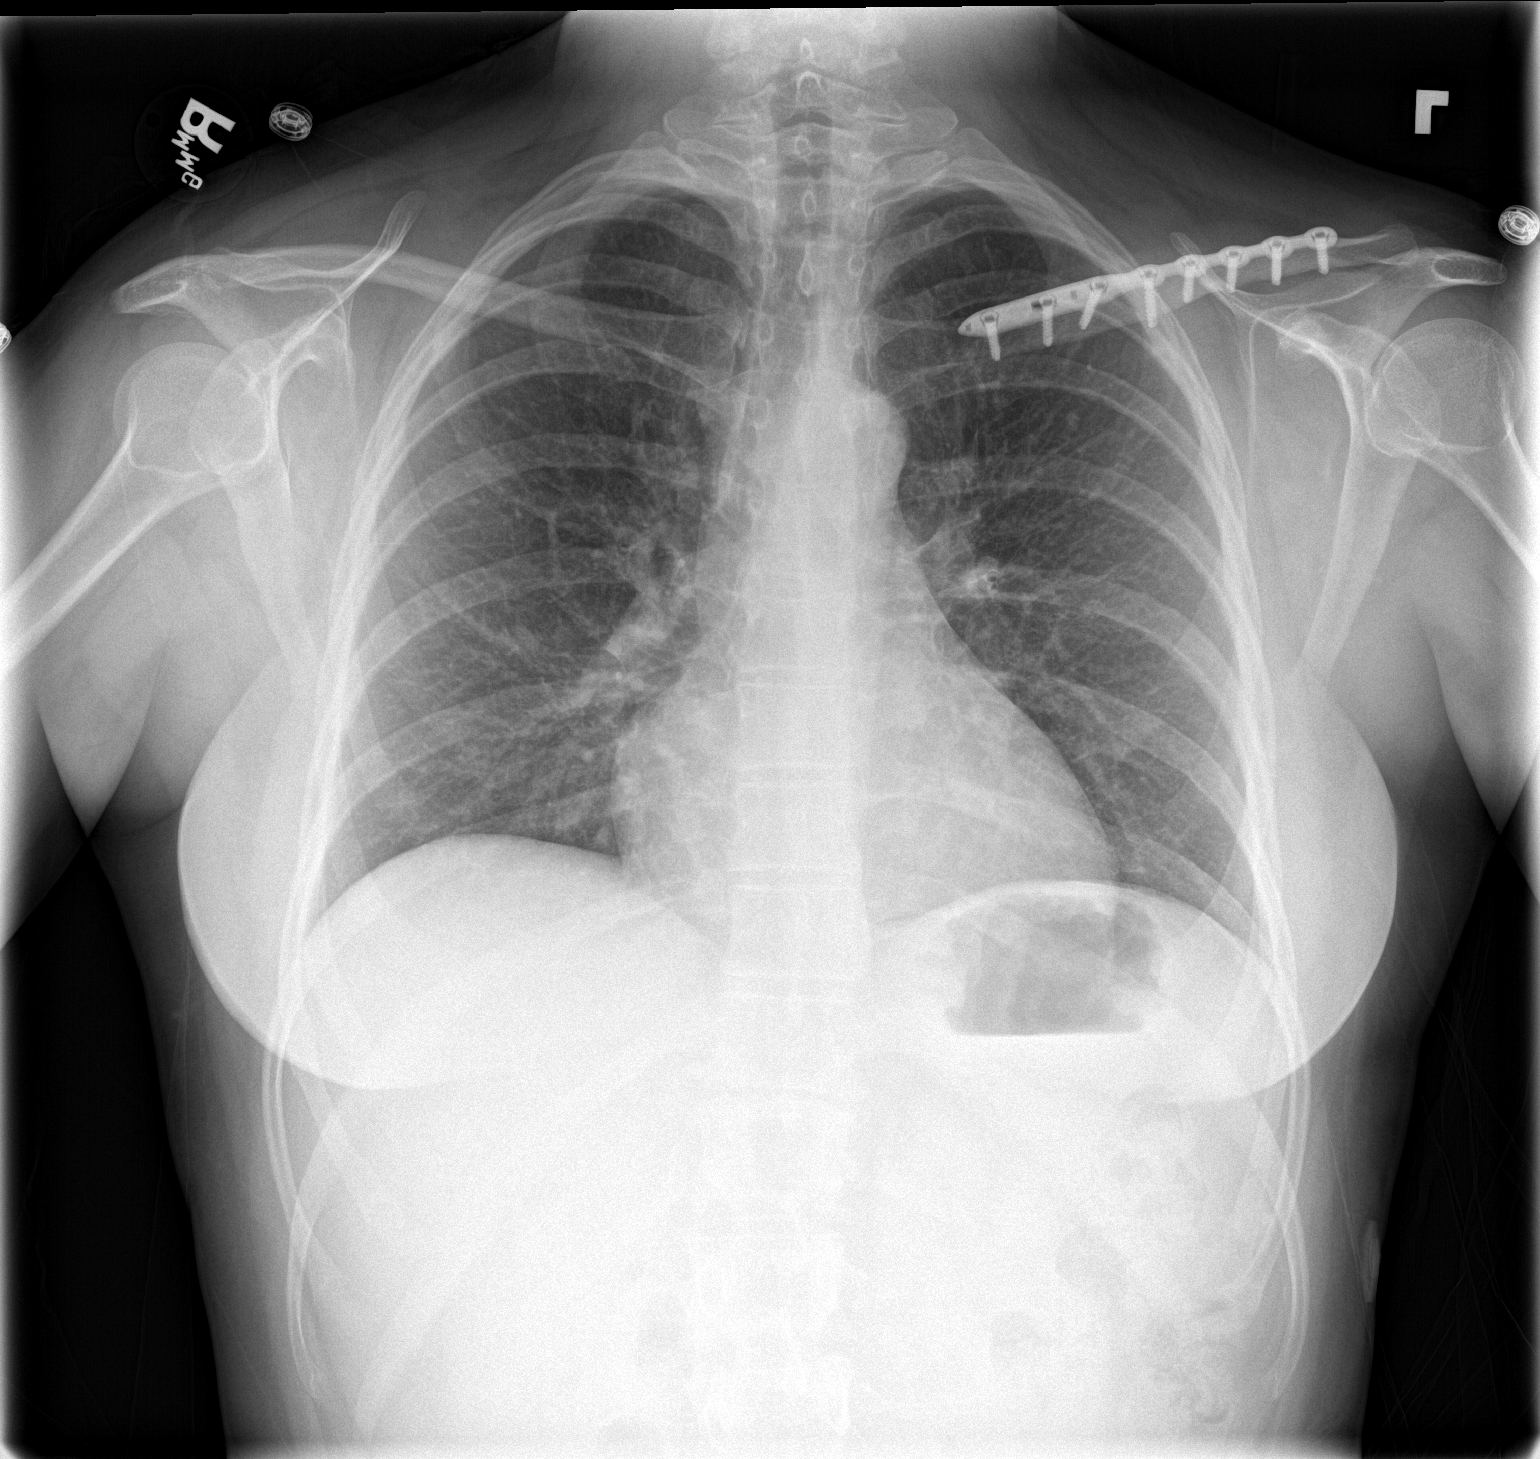

[chest lat]
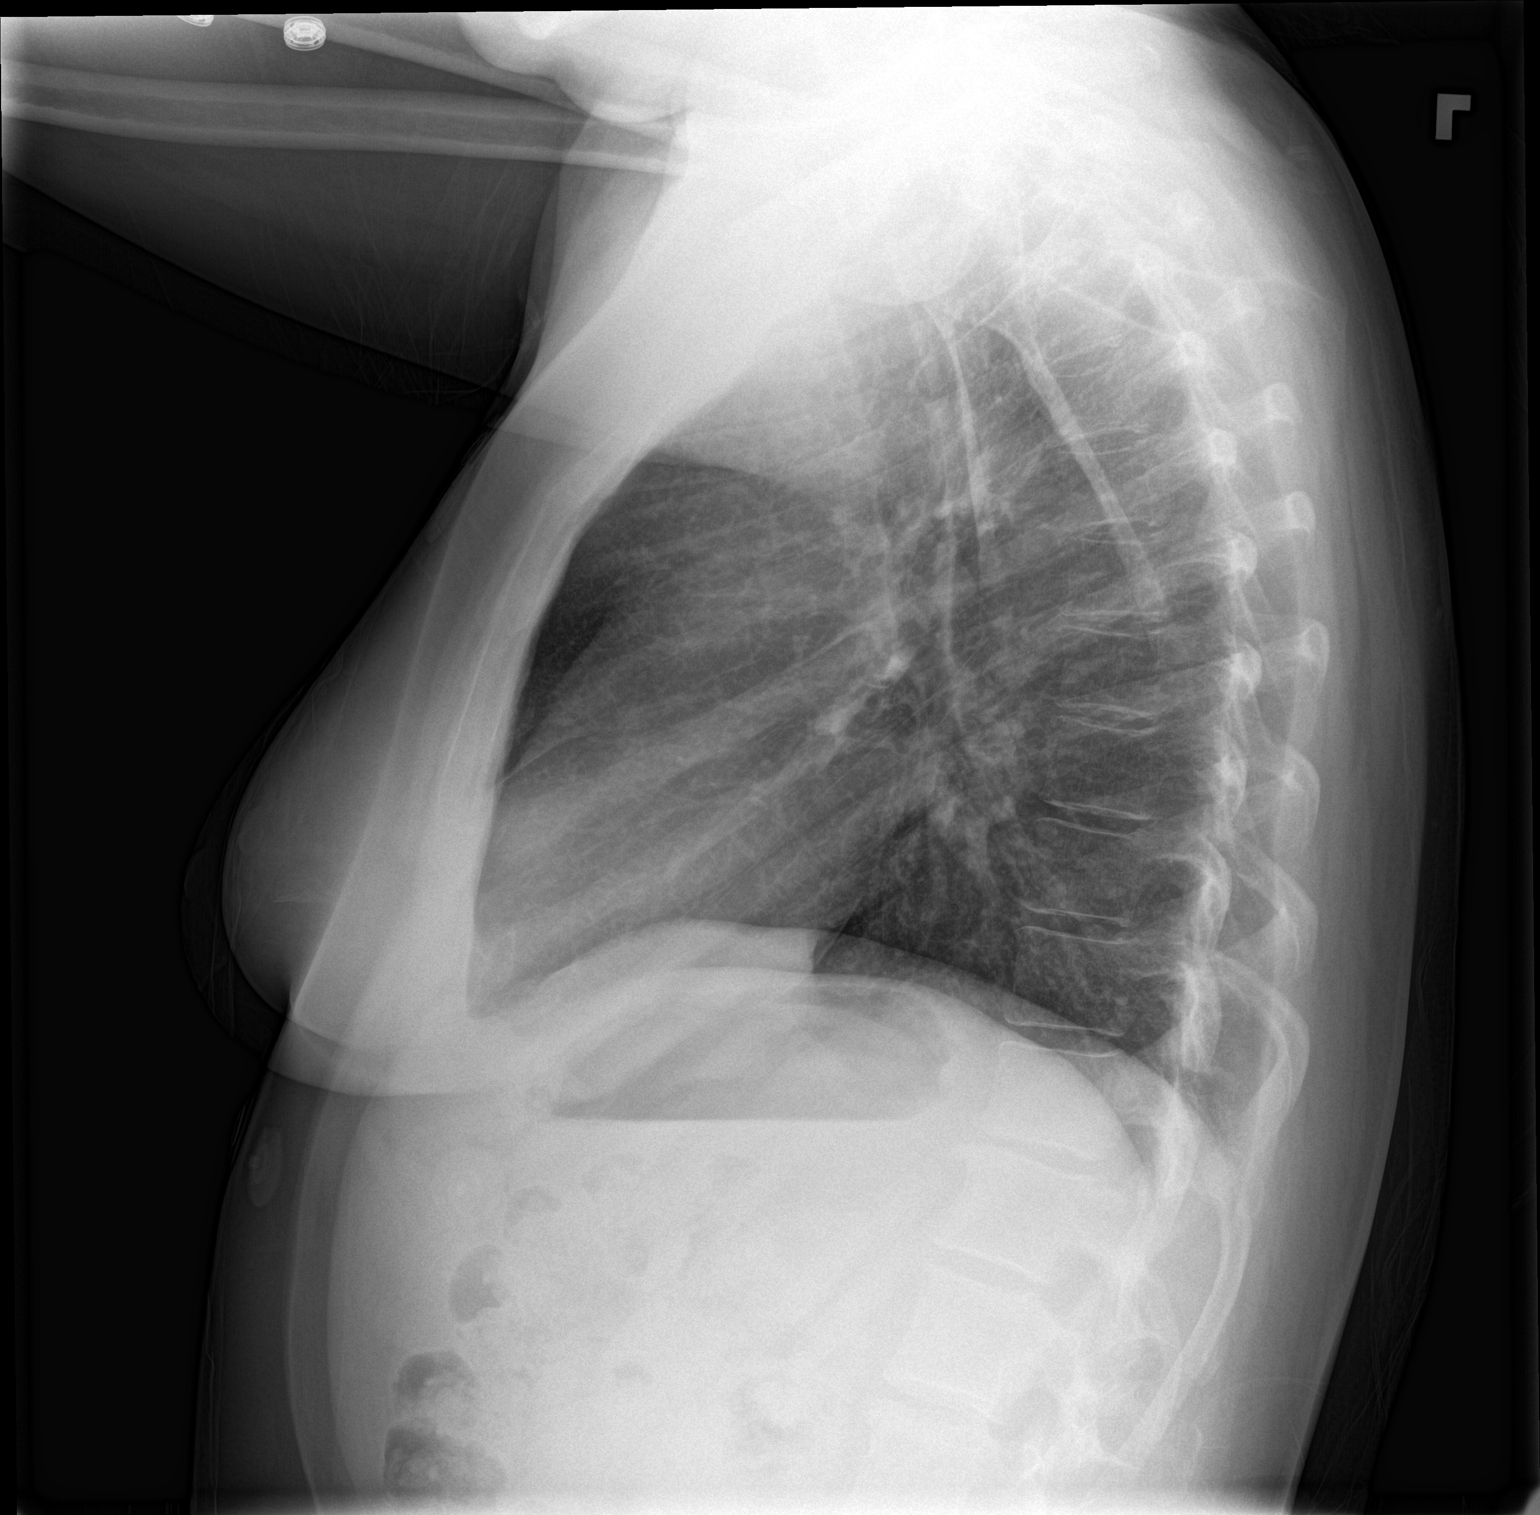

[2 of 2 positions shown; findings below may reference images not displayed]

FINDINGS: Stable cardiac and mediastinal contours. No consolidative pulmonary
opacities. No pleural effusion or pneumothorax. Plate and screw
fixation of the left clavicle.
IMPRESSION: No acute cardiopulmonary process.

## 2017-08-29 ENCOUNTER — Other Ambulatory Visit: Payer: Self-pay

## 2017-08-29 ENCOUNTER — Emergency Department (HOSPITAL_COMMUNITY)
Admission: EM | Admit: 2017-08-29 | Discharge: 2017-08-29 | Disposition: A | Discharge status: Home / Self Care | Payer: Self-pay | Attending: Emergency Medicine | Admitting: Emergency Medicine

## 2017-08-29 ENCOUNTER — Encounter (HOSPITAL_COMMUNITY): Payer: Self-pay | Admitting: Emergency Medicine

## 2017-08-29 DIAGNOSIS — W25XXXA Contact with sharp glass, initial encounter: Secondary | ICD-10-CM | POA: Insufficient documentation

## 2017-08-29 DIAGNOSIS — Y999 Unspecified external cause status: Secondary | ICD-10-CM | POA: Insufficient documentation

## 2017-08-29 DIAGNOSIS — S81812A Laceration without foreign body, left lower leg, initial encounter: Secondary | ICD-10-CM | POA: Insufficient documentation

## 2017-08-29 DIAGNOSIS — Y93E9 Activity, other interior property and clothing maintenance: Secondary | ICD-10-CM | POA: Insufficient documentation

## 2017-08-29 DIAGNOSIS — Y9289 Other specified places as the place of occurrence of the external cause: Secondary | ICD-10-CM | POA: Insufficient documentation

## 2017-08-29 DIAGNOSIS — Z23 Encounter for immunization: Secondary | ICD-10-CM | POA: Insufficient documentation

## 2017-08-29 MED ORDER — LIDOCAINE-EPINEPHRINE (PF) 1 %-1:200000 IJ SOLN
10.0000 mL | Freq: Once | INTRAMUSCULAR | Status: AC
  Administered 2017-08-29: 10 mL
  Filled 2017-08-29: qty 30

## 2017-08-29 MED ORDER — HYDROCODONE-ACETAMINOPHEN 5-325 MG PO TABS
1.0000 | ORAL_TABLET | Freq: Once | ORAL | Status: AC
  Administered 2017-08-29: 1 via ORAL
  Filled 2017-08-29: qty 1

## 2017-08-29 MED ORDER — TETANUS-DIPHTH-ACELL PERTUSSIS 5-2.5-18.5 LF-MCG/0.5 IM SUSP
0.5000 mL | Freq: Once | INTRAMUSCULAR | Status: AC
  Administered 2017-08-29: 0.5 mL via INTRAMUSCULAR
  Filled 2017-08-29: qty 0.5

## 2017-08-29 MED ORDER — POVIDONE-IODINE 10 % EX SOLN
CUTANEOUS | Status: DC | PRN
  Filled 2017-08-29: qty 15

## 2017-08-29 MED ORDER — CEPHALEXIN 500 MG PO CAPS: 500.0000 mg | ORAL_CAPSULE | Freq: Four times a day (QID) | ORAL | 0 refills | Status: DC
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
## 2017-08-29 NOTE — Discharge Instructions (Addendum)
Have your sutures removed in 10 days.  Keep your wound clean and dry,  Until a good scab forms - you may then wash gently twice daily with mild soap and water, but dry completely after.  Get rechecked for any sign of infection (redness,  Swelling,  Increased pain or drainage of purulent fluid). ° °

## 2017-09-01 IMAGING — DX DG FINGER MIDDLE 2+V*R*
3 series · 3 of 3 positions shown · non-contrast
Comparison: None.

CLINICAL DATA: Middle finger pain, jammed finger yesterday

EXAM:
RIGHT MIDDLE FINGER 2+V

[finger ap]
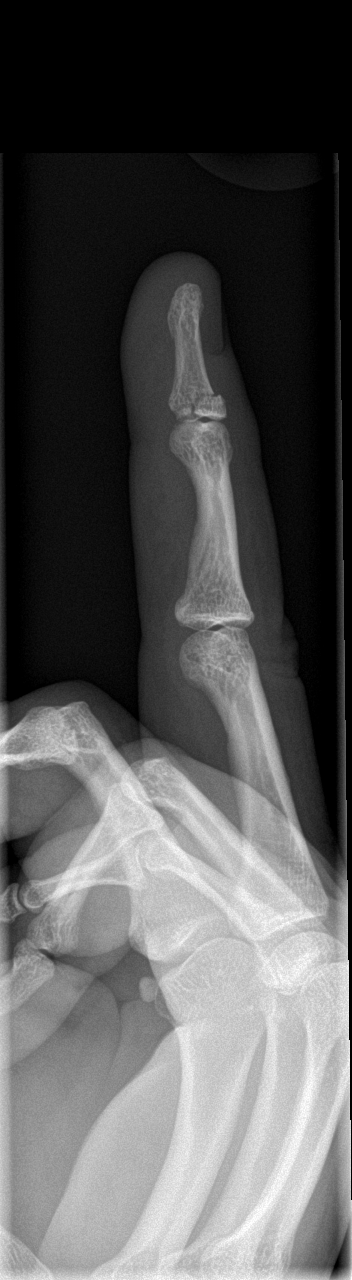

[finger obl]
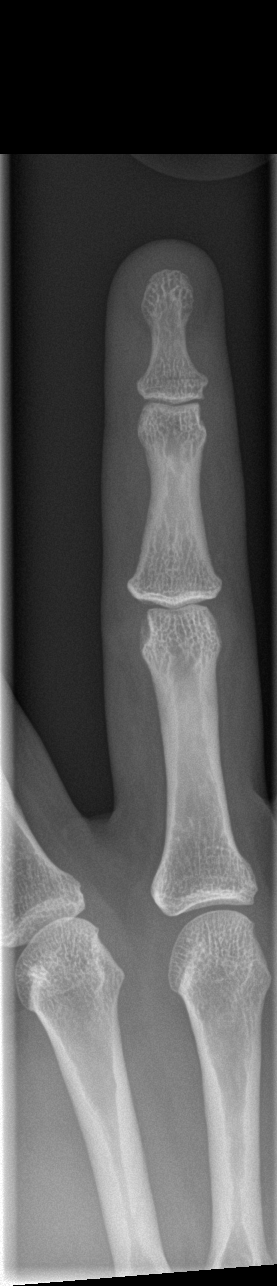

[finger lat]
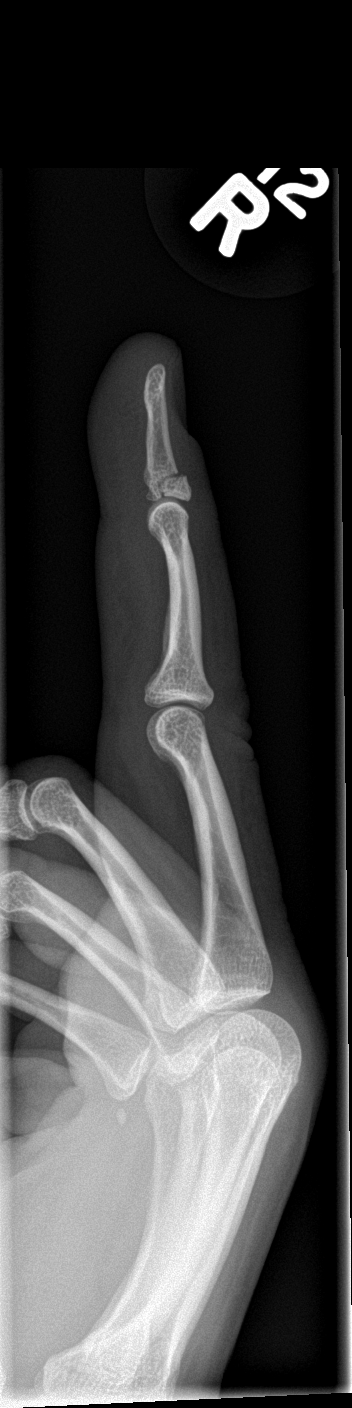

[3 of 3 positions shown; findings below may reference images not displayed]

FINDINGS: Three views of the right third finger submitted. There is minimal
displaced avulsion fracture dorsal aspect at the base of distal
phalanx.
IMPRESSION: Minimal displaced avulsion fracture dorsal aspect at the base of
distal phalanx right third finger.

## 2017-09-02 ENCOUNTER — Encounter (HOSPITAL_BASED_OUTPATIENT_CLINIC_OR_DEPARTMENT_OTHER): Payer: Self-pay | Admitting: Emergency Medicine

## 2017-09-02 ENCOUNTER — Other Ambulatory Visit: Payer: Self-pay

## 2017-09-02 ENCOUNTER — Emergency Department (HOSPITAL_BASED_OUTPATIENT_CLINIC_OR_DEPARTMENT_OTHER)
Admission: EM | Admit: 2017-09-02 | Discharge: 2017-09-02 | Disposition: A | Discharge status: Home / Self Care | Payer: Medicaid Other | Attending: Emergency Medicine | Admitting: Emergency Medicine

## 2017-09-02 DIAGNOSIS — F17210 Nicotine dependence, cigarettes, uncomplicated: Secondary | ICD-10-CM | POA: Insufficient documentation

## 2017-09-02 DIAGNOSIS — Z79899 Other long term (current) drug therapy: Secondary | ICD-10-CM | POA: Insufficient documentation

## 2017-09-02 DIAGNOSIS — J449 Chronic obstructive pulmonary disease, unspecified: Secondary | ICD-10-CM | POA: Insufficient documentation

## 2017-09-02 DIAGNOSIS — I10 Essential (primary) hypertension: Secondary | ICD-10-CM | POA: Insufficient documentation

## 2017-09-02 DIAGNOSIS — L03115 Cellulitis of right lower limb: Secondary | ICD-10-CM

## 2017-09-02 MED ORDER — NAPROXEN 500 MG PO TABS: 500.0000 mg | ORAL_TABLET | Freq: Two times a day (BID) | ORAL | 0 refills | Status: DC

## 2017-09-02 MED ORDER — CEPHALEXIN 250 MG PO CAPS
500.0000 mg | ORAL_CAPSULE | Freq: Once | ORAL | Status: AC
  Administered 2017-09-02: 500 mg via ORAL
  Filled 2017-09-02: qty 2

## 2017-09-02 MED ORDER — OXYCODONE-ACETAMINOPHEN 5-325 MG PO TABS
1.0000 | ORAL_TABLET | Freq: Once | ORAL | Status: AC
  Administered 2017-09-02: 1 via ORAL
  Filled 2017-09-02: qty 1

## 2017-09-02 MED ORDER — CEPHALEXIN 500 MG PO CAPS: 500.0000 mg | ORAL_CAPSULE | Freq: Four times a day (QID) | ORAL | 0 refills | Status: AC

## 2017-09-02 MED ORDER — OXYCODONE-ACETAMINOPHEN 5-325 MG PO TABS: 1.0000 | ORAL_TABLET | Freq: Three times a day (TID) | ORAL | 0 refills | Status: DC | PRN
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
## 2017-09-02 NOTE — Discharge Instructions (Signed)
Please take the antibiotics as directed.  Complete the entire course of this medication regardless of symptom improvement to prevent worsening or recurrence of your infection. Return to the Emergency Department urgently with worsening pain, swelling, expanding erythema especially if it streaks away from the affected area, fever

## 2017-10-08 IMAGING — DX DG THORACIC SPINE 2V
3 series · 3 of 3 positions shown · non-contrast
Comparison: Chest x-ray 11/18/2014

CLINICAL DATA: Right-sided back pain starting last night. Difficult
to breathe

EXAM:
THORACIC SPINE 2 VIEWS

[t-spine ap]
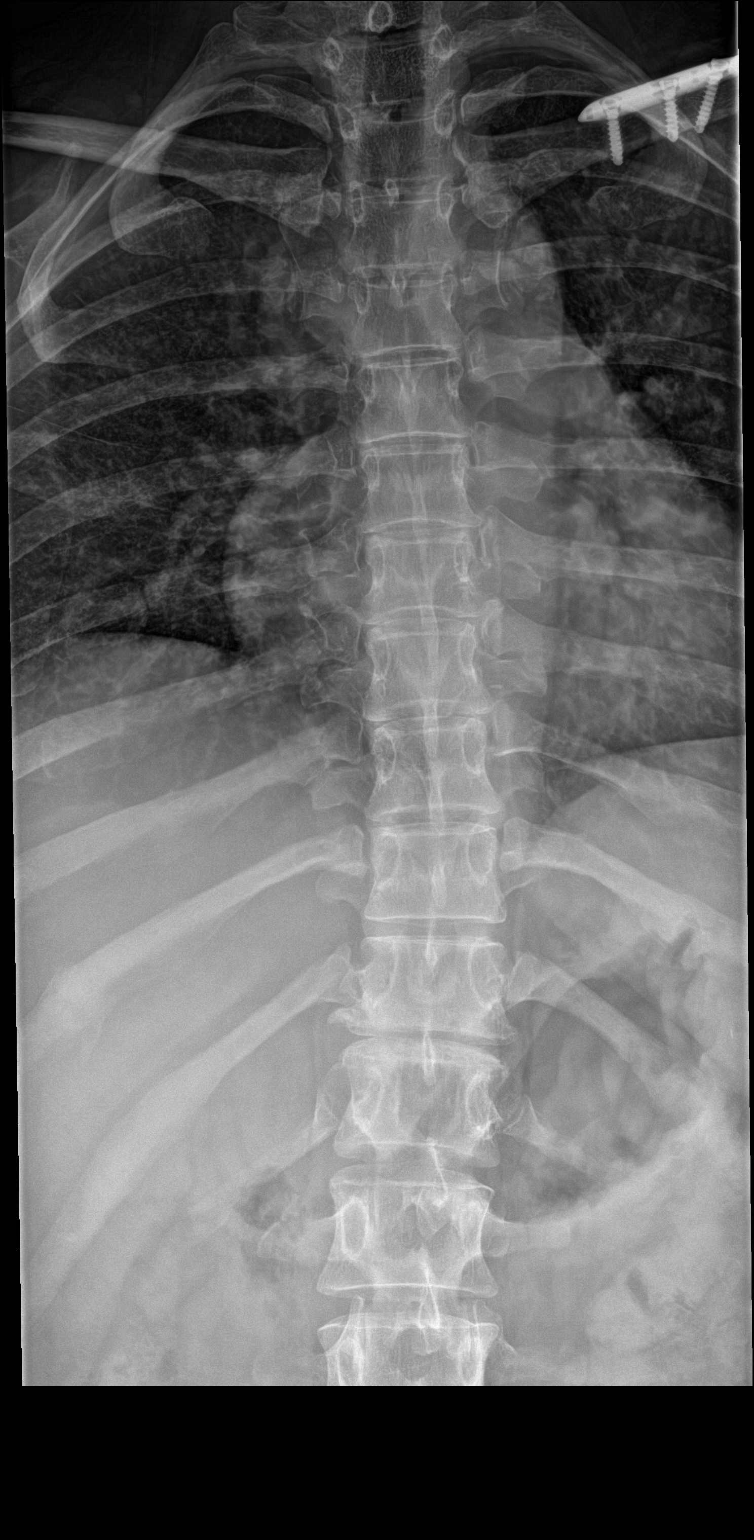

[t-spine lat]
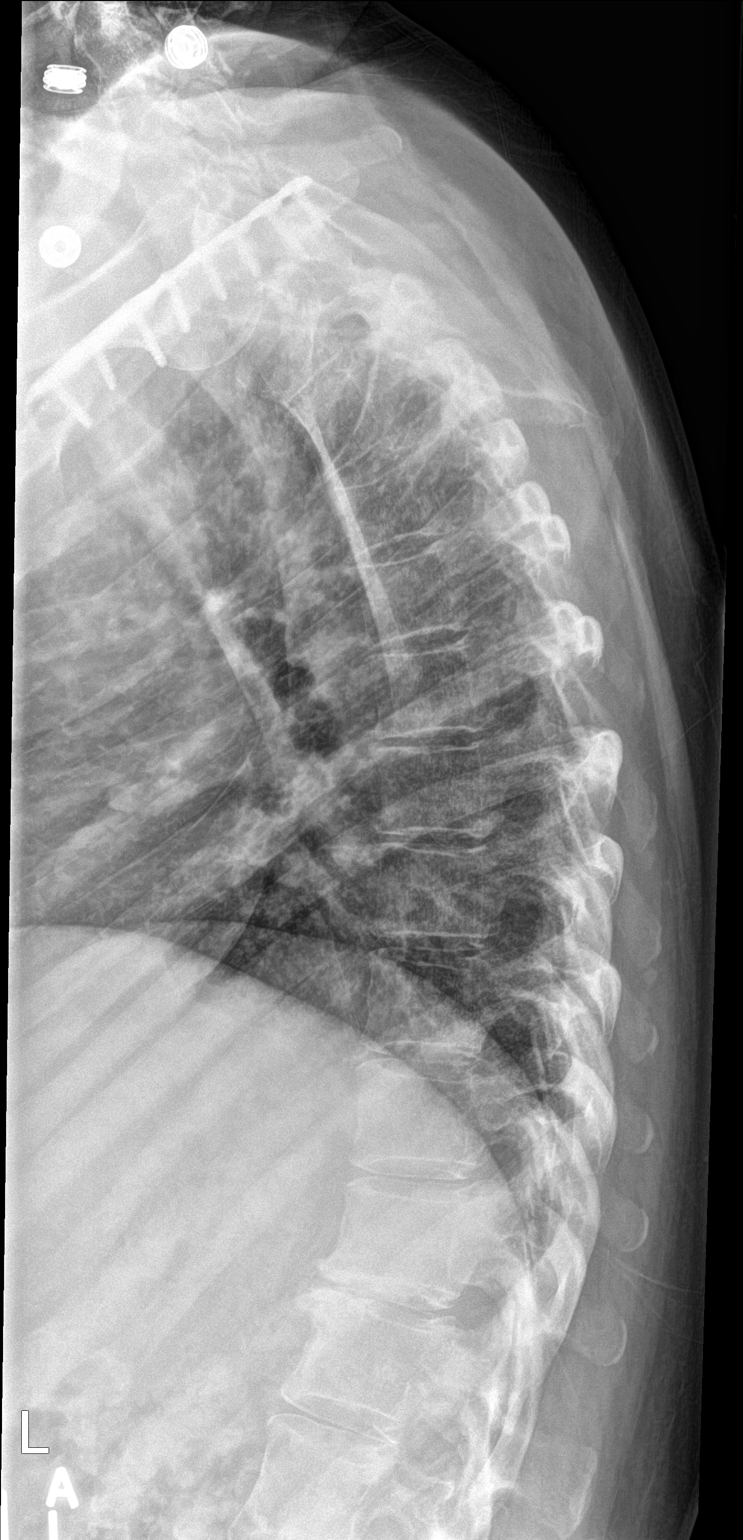

[t-spine swimmers]
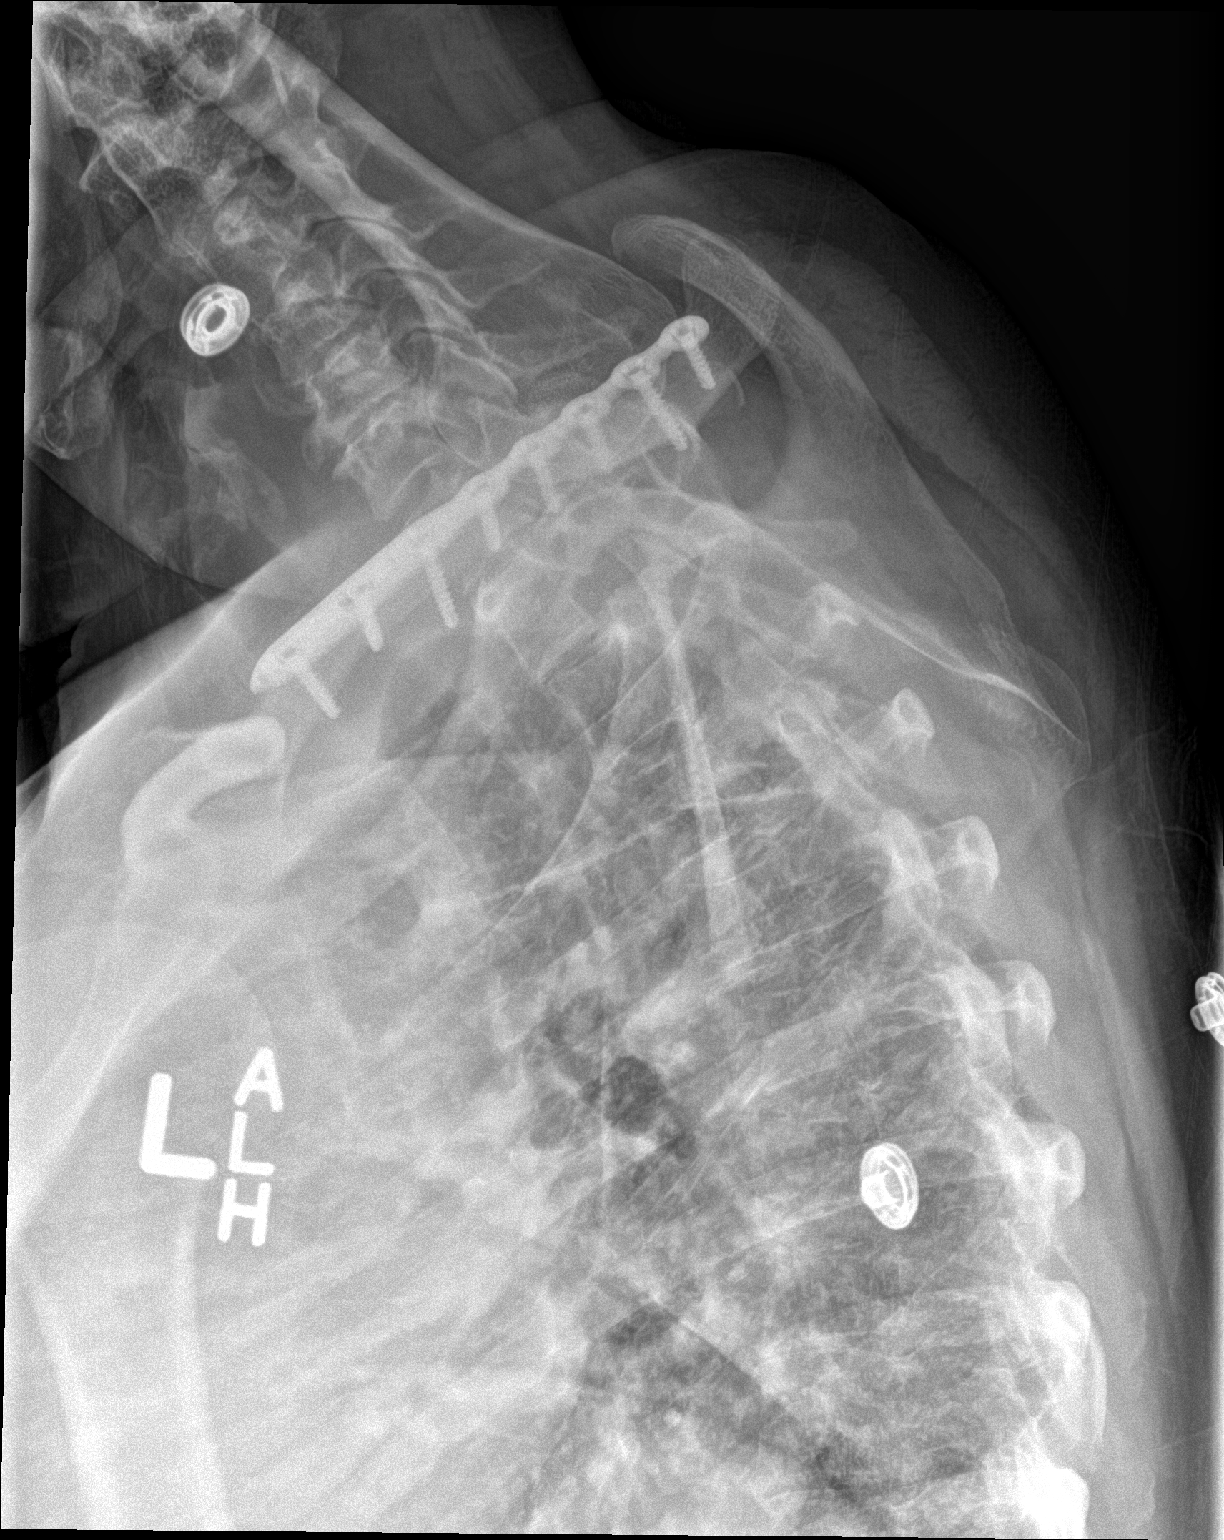

[3 of 3 positions shown; findings below may reference images not displayed]

FINDINGS: A posterior right tenth rib fracture appears chronic given
associated callus, newly developed since comparison however. There
is a lucency through the posterior right seventh rib which could
reflect recent nondisplaced fracture. No evidence of thoracic spine
fracture, subluxation, or endplate erosion. Focal T11-12
degenerative disc narrowing and endplate spurring.
IMPRESSION: 1. No acute thoracic finding.
2. Age indeterminate right seventh rib fracture.

## 2017-10-25 ENCOUNTER — Emergency Department (HOSPITAL_COMMUNITY): Payer: Self-pay

## 2017-10-25 ENCOUNTER — Encounter (HOSPITAL_COMMUNITY): Payer: Self-pay

## 2017-10-25 ENCOUNTER — Emergency Department (HOSPITAL_COMMUNITY)
Admission: EM | Admit: 2017-10-25 | Discharge: 2017-10-25 | Disposition: A | Discharge status: Home / Self Care | Payer: Self-pay | Attending: Emergency Medicine | Admitting: Emergency Medicine

## 2017-10-25 DIAGNOSIS — S42032A Displaced fracture of lateral end of left clavicle, initial encounter for closed fracture: Secondary | ICD-10-CM | POA: Insufficient documentation

## 2017-10-25 DIAGNOSIS — Y999 Unspecified external cause status: Secondary | ICD-10-CM | POA: Insufficient documentation

## 2017-10-25 DIAGNOSIS — W108XXA Fall (on) (from) other stairs and steps, initial encounter: Secondary | ICD-10-CM | POA: Insufficient documentation

## 2017-10-25 DIAGNOSIS — I10 Essential (primary) hypertension: Secondary | ICD-10-CM | POA: Insufficient documentation

## 2017-10-25 DIAGNOSIS — Y929 Unspecified place or not applicable: Secondary | ICD-10-CM | POA: Insufficient documentation

## 2017-10-25 DIAGNOSIS — S43102A Unspecified dislocation of left acromioclavicular joint, initial encounter: Secondary | ICD-10-CM | POA: Insufficient documentation

## 2017-10-25 DIAGNOSIS — Y939 Activity, unspecified: Secondary | ICD-10-CM | POA: Insufficient documentation

## 2017-10-25 DIAGNOSIS — W19XXXA Unspecified fall, initial encounter: Secondary | ICD-10-CM

## 2017-10-25 DIAGNOSIS — F17210 Nicotine dependence, cigarettes, uncomplicated: Secondary | ICD-10-CM | POA: Insufficient documentation

## 2017-10-25 MED ORDER — HYDROMORPHONE HCL 1 MG/ML IJ SOLN
0.5000 mg | Freq: Once | INTRAMUSCULAR | Status: AC
  Administered 2017-10-25: 0.5 mg via INTRAVENOUS
  Filled 2017-10-25: qty 1

## 2017-10-25 MED ORDER — OXYCODONE-ACETAMINOPHEN 5-325 MG PO TABS
1.0000 | ORAL_TABLET | Freq: Once | ORAL | Status: AC
  Administered 2017-10-25: 1 via ORAL
  Filled 2017-10-25: qty 1

## 2017-10-25 MED ORDER — OXYCODONE-ACETAMINOPHEN 5-325 MG PO TABS: 1.0000 | ORAL_TABLET | Freq: Three times a day (TID) | ORAL | 0 refills | Status: DC | PRN

## 2017-10-25 MED ORDER — FENTANYL CITRATE (PF) 100 MCG/2ML IJ SOLN
100.0000 ug | Freq: Once | INTRAMUSCULAR | Status: AC
  Administered 2017-10-25: 100 ug via INTRAVENOUS
  Filled 2017-10-25: qty 2
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
## 2017-10-25 NOTE — ED Notes (Signed)
Pt transported to xray and CT
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
## 2017-10-25 NOTE — Discharge Instructions (Signed)
Follow-up with Dr. Yates °

## 2017-10-26 ENCOUNTER — Encounter (HOSPITAL_COMMUNITY): Payer: Self-pay

## 2017-10-26 ENCOUNTER — Emergency Department (HOSPITAL_COMMUNITY)
Admission: EM | Admit: 2017-10-26 | Discharge: 2017-10-26 | Disposition: A | Discharge status: Home / Self Care | Payer: Self-pay | Attending: Emergency Medicine | Admitting: Emergency Medicine

## 2017-10-26 ENCOUNTER — Emergency Department (HOSPITAL_COMMUNITY): Payer: Self-pay

## 2017-10-26 DIAGNOSIS — S8002XA Contusion of left knee, initial encounter: Secondary | ICD-10-CM | POA: Insufficient documentation

## 2017-10-26 DIAGNOSIS — Y9289 Other specified places as the place of occurrence of the external cause: Secondary | ICD-10-CM | POA: Insufficient documentation

## 2017-10-26 DIAGNOSIS — S42032S Displaced fracture of lateral end of left clavicle, sequela: Secondary | ICD-10-CM

## 2017-10-26 DIAGNOSIS — Y999 Unspecified external cause status: Secondary | ICD-10-CM | POA: Insufficient documentation

## 2017-10-26 DIAGNOSIS — Y9389 Activity, other specified: Secondary | ICD-10-CM | POA: Insufficient documentation

## 2017-10-26 DIAGNOSIS — S42032D Displaced fracture of lateral end of left clavicle, subsequent encounter for fracture with routine healing: Secondary | ICD-10-CM | POA: Insufficient documentation

## 2017-10-26 MED ORDER — OXYCODONE-ACETAMINOPHEN 5-325 MG PO TABS
2.0000 | ORAL_TABLET | Freq: Once | ORAL | Status: AC
  Administered 2017-10-26: 2 via ORAL
  Filled 2017-10-26: qty 2

## 2017-10-26 NOTE — Discharge Instructions (Addendum)
Please use ice packs Get pain medicine filled Take ibuprofen for pain
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
## 2017-10-26 NOTE — ED Notes (Signed)
Patient transported to X-ray 
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
## 2017-10-26 NOTE — ED Notes (Signed)
Given warm blanket 

## 2017-11-07 ENCOUNTER — Emergency Department (HOSPITAL_COMMUNITY)
Admission: EM | Admit: 2017-11-07 | Discharge: 2017-11-08 | Disposition: A | Discharge status: Other Acute Inpatient Hospital | Payer: Federal, State, Local not specified - Other | Attending: Emergency Medicine | Admitting: Emergency Medicine

## 2017-11-07 DIAGNOSIS — R079 Chest pain, unspecified: Secondary | ICD-10-CM

## 2017-11-07 DIAGNOSIS — Z87442 Personal history of urinary calculi: Secondary | ICD-10-CM | POA: Insufficient documentation

## 2017-11-07 DIAGNOSIS — F332 Major depressive disorder, recurrent severe without psychotic features: Secondary | ICD-10-CM | POA: Diagnosis present

## 2017-11-07 DIAGNOSIS — F10920 Alcohol use, unspecified with intoxication, uncomplicated: Secondary | ICD-10-CM | POA: Insufficient documentation

## 2017-11-07 DIAGNOSIS — I10 Essential (primary) hypertension: Secondary | ICD-10-CM | POA: Insufficient documentation

## 2017-11-07 DIAGNOSIS — Y908 Blood alcohol level of 240 mg/100 ml or more: Secondary | ICD-10-CM | POA: Insufficient documentation

## 2017-11-07 DIAGNOSIS — J449 Chronic obstructive pulmonary disease, unspecified: Secondary | ICD-10-CM | POA: Insufficient documentation

## 2017-11-07 DIAGNOSIS — F17210 Nicotine dependence, cigarettes, uncomplicated: Secondary | ICD-10-CM | POA: Insufficient documentation

## 2017-11-07 DIAGNOSIS — T50902A Poisoning by unspecified drugs, medicaments and biological substances, intentional self-harm, initial encounter: Secondary | ICD-10-CM | POA: Insufficient documentation

## 2017-11-07 LAB — CBC
HCT: 41.4 % (ref 36.0–46.0)
Hemoglobin: 13.9 g/dL (ref 12.0–15.0)
MCH: 30.3 pg (ref 26.0–34.0)
MCHC: 33.6 g/dL (ref 30.0–36.0)
MCV: 90.2 fL (ref 78.0–100.0)
Platelets: 221 10*3/uL (ref 150–400)
RBC: 4.59 MIL/uL (ref 3.87–5.11)
RDW: 13.8 % (ref 11.5–15.5)
WBC: 8.9 10*3/uL (ref 4.0–10.5)

## 2017-11-07 LAB — RAPID URINE DRUG SCREEN, HOSP PERFORMED
Amphetamines: NOT DETECTED
BENZODIAZEPINES: NOT DETECTED
Barbiturates: NOT DETECTED
COCAINE: NOT DETECTED
Tetrahydrocannabinol: NOT DETECTED

## 2017-11-07 LAB — COMPREHENSIVE METABOLIC PANEL
ALT: 59 U/L — ABNORMAL HIGH (ref 0–44)
AST: 62 U/L — ABNORMAL HIGH (ref 15–41)
Albumin: 3.6 g/dL (ref 3.5–5.0)
Alkaline Phosphatase: 117 U/L (ref 38–126)
Anion gap: 13 (ref 5–15)
BILIRUBIN TOTAL: 1.5 mg/dL — AB (ref 0.3–1.2)
BUN: 15 mg/dL (ref 6–20)
CHLORIDE: 104 mmol/L (ref 98–111)
CO2: 23 mmol/L (ref 22–32)
CREATININE: 0.9 mg/dL (ref 0.44–1.00)
Calcium: 8.9 mg/dL (ref 8.9–10.3)
Glucose, Bld: 98 mg/dL (ref 70–99)
POTASSIUM: 5.9 mmol/L — AB (ref 3.5–5.1)
Sodium: 140 mmol/L (ref 135–145)
TOTAL PROTEIN: 6.3 g/dL — AB (ref 6.5–8.1)

## 2017-11-07 LAB — ETHANOL: ALCOHOL ETHYL (B): 355 mg/dL — AB (ref <10)

## 2017-11-07 LAB — SALICYLATE LEVEL

## 2017-11-07 LAB — POTASSIUM: POTASSIUM: 3.9 mmol/L (ref 3.5–5.1)

## 2017-11-07 LAB — I-STAT BETA HCG BLOOD, ED (MC, WL, AP ONLY): I-stat hCG, quantitative: <5 m[IU]/mL (ref <5)

## 2017-11-07 LAB — CBG MONITORING, ED: GLUCOSE-CAPILLARY: 86 mg/dL (ref 70–99)

## 2017-11-07 LAB — ACETAMINOPHEN LEVEL

## 2017-11-07 MED ORDER — ONDANSETRON HCL 4 MG PO TABS: 4.0000 mg | ORAL_TABLET | Freq: Three times a day (TID) | ORAL | Status: DC | PRN

## 2017-11-07 MED ORDER — NICOTINE 21 MG/24HR TD PT24
21.0000 mg | MEDICATED_PATCH | Freq: Every day | TRANSDERMAL | Status: DC
  Administered 2017-11-08: 21 mg via TRANSDERMAL
  Filled 2017-11-07: qty 1

## 2017-11-07 MED ORDER — ZOLPIDEM TARTRATE 5 MG PO TABS: 5.0000 mg | ORAL_TABLET | Freq: Every evening | ORAL | Status: DC | PRN

## 2017-11-07 MED ORDER — LORAZEPAM 1 MG PO TABS
0.0000 mg | ORAL_TABLET | Freq: Two times a day (BID) | ORAL | Status: DC
Start: 2017-11-10 — End: 2017-11-08

## 2017-11-07 MED ORDER — LORAZEPAM 1 MG PO TABS
0.0000 mg | ORAL_TABLET | Freq: Four times a day (QID) | ORAL | Status: DC
  Administered 2017-11-07: 2 mg via ORAL
  Administered 2017-11-08 (×2): 1 mg via ORAL
  Filled 2017-11-07: qty 1
  Filled 2017-11-07: qty 2
  Filled 2017-11-07: qty 1

## 2017-11-07 MED ORDER — LORAZEPAM 2 MG/ML IJ SOLN: 0.0000 mg | Freq: Four times a day (QID) | INTRAMUSCULAR | Status: DC

## 2017-11-07 MED ORDER — ACETAMINOPHEN 325 MG PO TABS
650.0000 mg | ORAL_TABLET | ORAL | Status: DC | PRN
  Administered 2017-11-08: 650 mg via ORAL
  Filled 2017-11-07: qty 2

## 2017-11-07 MED ORDER — NALOXONE HCL 0.4 MG/ML IJ SOLN
0.4000 mg | Freq: Once | INTRAMUSCULAR | Status: AC
  Administered 2017-11-07: 0.4 mg via INTRAVENOUS
  Filled 2017-11-07: qty 1

## 2017-11-07 MED ORDER — VITAMIN B-1 100 MG PO TABS
100.0000 mg | ORAL_TABLET | Freq: Every day | ORAL | Status: DC
  Administered 2017-11-08: 100 mg via ORAL
  Filled 2017-11-07: qty 1

## 2017-11-07 MED ORDER — LORAZEPAM 2 MG/ML IJ SOLN: 0.0000 mg | Freq: Two times a day (BID) | INTRAMUSCULAR | Status: DC

## 2017-11-07 MED ORDER — THIAMINE HCL 100 MG/ML IJ SOLN: 100.0000 mg | Freq: Every day | INTRAMUSCULAR | Status: DC

## 2017-11-07 NOTE — ED Provider Notes (Addendum)
[facility]-EMERGENCY DEPT Provider Note   CSN: [id number] Arrival date & time: 11/07/17  1708     History   Chief Complaint Chief Complaint  Patient presents with  . Drug Overdose    HPI [name] is a 42 y.o. female. level5 caveat HPI 42 yo female ho anxiety, asthma, copd presents via ems with report that she drank alcohol, then passed out and was given narcan.  She awoke after the narcan but has been uncommunicative.   Past Medical History:  Diagnosis Date  . Anxiety   . Arthritis   . Asthma    last flareup was yr or so with bronchitis  . Carpal tunnel syndrome   . COPD (chronic obstructive pulmonary disease) (HCC)   . Depressed   . Dysrhythmia    'skipped beats every now and then"  . Emphysema    does smoke, and has slowed down  . Fibromyalgia   . Fibromyalgia, primary   . GERD (gastroesophageal reflux disease)    periodic indigestion  . Hepatitis C   . Hypertension    dx a couple of months ago  . Kidney stone   . Nephrolithiasis   . UTI (lower urinary tract infection)     Patient Active Problem List   Diagnosis Date Noted  . Right thigh pain 11/21/2015  . Finger injury 11/21/2015  . 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 Abdominal pain, epigastric 12/10/2014  . Tachycardia 11/17/2014  . HTN (hypertension) 11/17/2014  . 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 Facial droop 10/22/2013  . Fracture of clavicle, left, closed 08/30/2012  . Left shoulder pain 06/18/2012  . Bipolar disorder (HCC) 03/24/2012  . Nausea 03/23/2012  . Chronic hepatitis C without hepatic coma (HCC) 02/21/2012  . Elevated liver enzymes 02/20/2012  . Asthma 02/20/2012  . Right knee pain 05/17/2011  . Emphysema     Past Surgical History:  Procedure Laterality Date  . ABDOMINAL HYSTERECTOMY    . ANTERIOR CERVICAL DECOMP/DISCECTOMY FUSION N/A 04/22/2015   Procedure: Cervical 4-5, Cervical 5-6 Anterior Cervical Discectomy and Fusion, Allograft, Plate;   Surgeon: Eldred Manges, MD;  [facility]: [facility] OR;  Service: Orthopedics;  Laterality: N/A;  . CARPAL TUNNEL RELEASE     right hand  . ORIF ANKLE FRACTURE  02/03/2012   Procedure: OPEN REDUCTION INTERNAL FIXATION (ORIF) ANKLE FRACTURE;  Surgeon: Nadara Mustard, MD;  Location: [facility] OR;  Service: Orthopedics;  Laterality: Right;  Open Reduction Internal Fixation Right Ankle  . ORIF CLAVICULAR FRACTURE Left 08/29/2012   Dr Ophelia Charter  . ORIF CLAVICULAR FRACTURE Left 08/29/2012   Procedure: OPEN REDUCTION INTERNAL FIXATION (ORIF) CLAVICULAR FRACTURE;  Surgeon: Eldred Manges, MD;  Location: [facility] OR;  Service: Orthopedics;  Laterality: Left;  Open Reduction Internal Fixation Left Clavicle Fracture  . TUBAL LIGATION    . WISDOM TOOTH EXTRACTION       OB History    Gravida      Para      Term      Preterm      AB      Living  4     SAB      TAB      Ectopic      Multiple      Live Births           Obstetric Comments  S/p hysterectomy         Home Medications    Prior to Admission medications   Medication Sig  Start Date End Date Taking? Authorizing Provider  naproxen (NAPROSYN) 500 MG tablet Take 1 tablet (500 mg total) by mouth 2 (two) times daily. Patient not taking: Reported on 10/25/2017 09/02/17   Dietrich PatesKhatri, Hina, PA-C  oxyCODONE-acetaminophen (PERCOCET/ROXICET) 5-325 MG tablet Take 1-2 tablets by mouth every 8 (eight) hours as needed for severe pain. Patient not taking: Reported on 10/26/2017 10/25/17   Benjiman CorePickering, Nathan, MD    Family History Family History  Problem Relation Age of Onset  . Alcohol abuse Father   . Cirrhosis Father   . Cancer Father   . Heart attack Mother   . Hypertension Mother   . Hyperthyroidism Sister   . Bipolar disorder Sister   . Kidney disease Brother     Social History Social History   Tobacco Use  . Smoking status: Current Every Day Smoker    Packs/day: 0.40    Years: 10.00    Pack years: 4.00    Types: Cigarettes    Start date: 03/28/1990  .  Smokeless tobacco: Never Used  . Tobacco comment: cutting back  Substance Use Topics  . Alcohol use: Never    Alcohol/week: 0.0 standard drinks    Frequency: Never    Comment: on occasion  . Drug use: No     Allergies   Sulfa antibiotics; Eggs or egg-derived products; and Milk-related compounds   Review of Systems Review of Systems  Unable to perform ROS: Other     Physical Exam Updated Vital Signs BP (!) 89/60   Pulse 94   Temp 97.8 F (36.6 C) (Rectal)   Resp 18   SpO2 97%   Physical Exam  Constitutional: She appears well-developed and well-nourished.  HENT:  Head: Normocephalic and atraumatic.  Right Ear: External ear normal.  Left Ear: External ear normal.  Nose: Nose normal.  Mouth/Throat: Oropharynx is clear and moist.  Eyes: Pupils are equal, round, and reactive to light. Conjunctivae and EOM are normal.  Neck: Normal range of motion. Neck supple.  Cardiovascular: Normal rate, regular rhythm and normal heart sounds.  Pulmonary/Chest: Effort normal and breath sounds normal.  Abdominal: Soft. Bowel sounds are normal.  Musculoskeletal:  Contusion left upper arm Well healed scar left upper chest Contusion lle No point ttp on extremities or spine  Neurological: She is alert. She displays normal reflexes. No cranial nerve deficit or sensory deficit. She exhibits normal muscle tone. Coordination normal.  Patient nods her head in response when questioned  Skin: Skin is warm and dry. Capillary refill takes less than 2 seconds.  Psychiatric: Her affect is blunt. She is noncommunicative.  Nursing note and vitals reviewed.    ED Treatments / Results  Labs (all labs ordered are listed, but only abnormal results are displayed) Labs Reviewed  CBC  COMPREHENSIVE METABOLIC PANEL  ETHANOL  RAPID URINE DRUG SCREEN, HOSP PERFORMED  ACETAMINOPHEN LEVEL  SALICYLATE LEVEL  I-STAT BETA HCG BLOOD, ED ([facility], [facility], [facility] ONLY)    EKG EKG  Interpretation  Date/Time:  Tuesday November 07 2017 17:20:50 EDT Ventricular Rate:  92 PR Interval:    QRS Duration: 94 QT Interval:  370 QTC Calculation: 458 R Axis:   81 Text Interpretation:  Sinus rhythm Consider left ventricular hypertrophy Confirmed by Margarita Grizzleay, Fronia Depass [phone]([id number]) on 11/07/2017 6:05:30 PM   Radiology No results found.  Procedures Procedures (including critical care time)  Medications Ordered in ED Medications - No data to display   Initial Impression / Assessment and Plan / ED Course  I have  reviewed the triage vital signs and the nursing notes.  Pertinent labs & imaging results that were available during my care of the patient were reviewed by me and considered in my medical decision making (see chart for details).     7:49 PM Patient less responsive than previous Narcan ordered 8:23 PM Patient awake and conversant Patient now states that she is depressed.  She currently denies suicidal ideation.  Medically she is cleared.  Plan consult to TTS. Final Clinical Impressions(s) / ED Diagnoses   Final diagnoses:  Depression, unspecified depression type  Intentional drug overdose, initial encounter [facility](HCC)    ED Discharge Orders    None       Margarita Grizzleay, Dwayne Bulkley, MD 11/07/17 2024    Margarita Grizzleay, Ayron Fillinger, MD 11/07/17 2211
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
## 2017-11-07 NOTE — ED Notes (Signed)
Bed: WBH36 Expected date:  Expected time:  Means of arrival:  Comments: Rm 23 

## 2017-11-07 NOTE — ED Notes (Addendum)
Pt stated that she wanted to see her doctor because that is the only person that has been nice to her since she tried to kill herself earlier today. Pt also called out stating that she was suicidal.

## 2017-11-07 NOTE — ED Notes (Signed)
Critical value. Alcohol level 355.
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
## 2017-11-07 NOTE — ED Notes (Signed)
Sent light green tube down to main lab.

## 2017-11-08 ENCOUNTER — Emergency Department (HOSPITAL_COMMUNITY): Payer: Self-pay

## 2017-11-08 ENCOUNTER — Encounter (HOSPITAL_COMMUNITY): Payer: Self-pay | Admitting: *Deleted

## 2017-11-08 ENCOUNTER — Other Ambulatory Visit: Payer: Self-pay

## 2017-11-08 ENCOUNTER — Inpatient Hospital Stay (HOSPITAL_COMMUNITY)
Admission: AD | Admit: 2017-11-08 | Discharge: 2017-11-10 | DRG: 885 | Disposition: A | Discharge status: Home / Self Care | Payer: Federal, State, Local not specified - Other | Source: Intra-hospital | Attending: Psychiatry | Admitting: Psychiatry

## 2017-11-08 DIAGNOSIS — M797 Fibromyalgia: Secondary | ICD-10-CM | POA: Diagnosis present

## 2017-11-08 DIAGNOSIS — K219 Gastro-esophageal reflux disease without esophagitis: Secondary | ICD-10-CM | POA: Diagnosis present

## 2017-11-08 DIAGNOSIS — Z91410 Personal history of adult physical and sexual abuse: Secondary | ICD-10-CM

## 2017-11-08 DIAGNOSIS — F332 Major depressive disorder, recurrent severe without psychotic features: Secondary | ICD-10-CM | POA: Diagnosis not present

## 2017-11-08 DIAGNOSIS — F1020 Alcohol dependence, uncomplicated: Secondary | ICD-10-CM | POA: Diagnosis present

## 2017-11-08 DIAGNOSIS — Y908 Blood alcohol level of 240 mg/100 ml or more: Secondary | ICD-10-CM | POA: Diagnosis present

## 2017-11-08 DIAGNOSIS — Z8249 Family history of ischemic heart disease and other diseases of the circulatory system: Secondary | ICD-10-CM

## 2017-11-08 DIAGNOSIS — Z811 Family history of alcohol abuse and dependence: Secondary | ICD-10-CM | POA: Diagnosis not present

## 2017-11-08 DIAGNOSIS — R45851 Suicidal ideations: Secondary | ICD-10-CM

## 2017-11-08 DIAGNOSIS — Z79899 Other long term (current) drug therapy: Secondary | ICD-10-CM | POA: Diagnosis not present

## 2017-11-08 DIAGNOSIS — Z87442 Personal history of urinary calculi: Secondary | ICD-10-CM | POA: Diagnosis not present

## 2017-11-08 DIAGNOSIS — Z62810 Personal history of physical and sexual abuse in childhood: Secondary | ICD-10-CM | POA: Diagnosis present

## 2017-11-08 DIAGNOSIS — F4310 Post-traumatic stress disorder, unspecified: Secondary | ICD-10-CM | POA: Diagnosis present

## 2017-11-08 DIAGNOSIS — Z91018 Allergy to other foods: Secondary | ICD-10-CM

## 2017-11-08 DIAGNOSIS — F1010 Alcohol abuse, uncomplicated: Secondary | ICD-10-CM

## 2017-11-08 DIAGNOSIS — F333 Major depressive disorder, recurrent, severe with psychotic symptoms: Secondary | ICD-10-CM | POA: Diagnosis present

## 2017-11-08 DIAGNOSIS — J439 Emphysema, unspecified: Secondary | ICD-10-CM | POA: Diagnosis present

## 2017-11-08 DIAGNOSIS — Z882 Allergy status to sulfonamides status: Secondary | ICD-10-CM

## 2017-11-08 DIAGNOSIS — I10 Essential (primary) hypertension: Secondary | ICD-10-CM | POA: Diagnosis present

## 2017-11-08 DIAGNOSIS — Z23 Encounter for immunization: Secondary | ICD-10-CM

## 2017-11-08 DIAGNOSIS — Z79891 Long term (current) use of opiate analgesic: Secondary | ICD-10-CM

## 2017-11-08 DIAGNOSIS — B182 Chronic viral hepatitis C: Secondary | ICD-10-CM | POA: Diagnosis present

## 2017-11-08 DIAGNOSIS — F17210 Nicotine dependence, cigarettes, uncomplicated: Secondary | ICD-10-CM | POA: Diagnosis present

## 2017-11-08 MED ORDER — ALUM & MAG HYDROXIDE-SIMETH 200-200-20 MG/5ML PO SUSP
30.0000 mL | ORAL | Status: DC | PRN
  Administered 2017-11-08 – 2017-11-10 (×2): 30 mL via ORAL
  Filled 2017-11-08 (×2): qty 30

## 2017-11-08 MED ORDER — LORAZEPAM 1 MG PO TABS: 1.0000 mg | ORAL_TABLET | Freq: Two times a day (BID) | ORAL | Status: DC

## 2017-11-08 MED ORDER — LORAZEPAM 1 MG PO TABS
1.0000 mg | ORAL_TABLET | Freq: Three times a day (TID) | ORAL | Status: AC
  Administered 2017-11-09 – 2017-11-10 (×3): 1 mg via ORAL
  Filled 2017-11-08 (×3): qty 1

## 2017-11-08 MED ORDER — TRAZODONE HCL 50 MG PO TABS
50.0000 mg | ORAL_TABLET | Freq: Every evening | ORAL | Status: DC | PRN
  Administered 2017-11-09: 50 mg via ORAL
  Filled 2017-11-08: qty 7
  Filled 2017-11-08: qty 1

## 2017-11-08 MED ORDER — LORAZEPAM 1 MG PO TABS
1.0000 mg | ORAL_TABLET | Freq: Four times a day (QID) | ORAL | Status: AC
  Administered 2017-11-08 – 2017-11-09 (×3): 1 mg via ORAL
  Filled 2017-11-08 (×3): qty 1

## 2017-11-08 MED ORDER — PNEUMOCOCCAL VAC POLYVALENT 25 MCG/0.5ML IJ INJ
0.5000 mL | INJECTION | INTRAMUSCULAR | Status: AC
  Administered 2017-11-09: 0.5 mL via INTRAMUSCULAR

## 2017-11-08 MED ORDER — LORAZEPAM 1 MG PO TABS: 1.0000 mg | ORAL_TABLET | Freq: Every day | ORAL | Status: DC

## 2017-11-08 MED ORDER — HYDROXYZINE HCL 25 MG PO TABS: 25.0000 mg | ORAL_TABLET | Freq: Four times a day (QID) | ORAL | Status: DC | PRN

## 2017-11-08 MED ORDER — IBUPROFEN 800 MG PO TABS
800.0000 mg | ORAL_TABLET | Freq: Four times a day (QID) | ORAL | Status: DC | PRN
  Administered 2017-11-08 – 2017-11-10 (×5): 800 mg via ORAL
  Filled 2017-11-08 (×6): qty 1

## 2017-11-08 MED ORDER — ADULT MULTIVITAMIN W/MINERALS CH
1.0000 | ORAL_TABLET | Freq: Every day | ORAL | Status: DC
  Administered 2017-11-08 – 2017-11-10 (×3): 1 via ORAL
  Filled 2017-11-08 (×7): qty 1

## 2017-11-08 MED ORDER — LOPERAMIDE HCL 2 MG PO CAPS: 2.0000 mg | ORAL_CAPSULE | ORAL | Status: DC | PRN

## 2017-11-08 MED ORDER — GABAPENTIN 300 MG PO CAPS
300.0000 mg | ORAL_CAPSULE | Freq: Three times a day (TID) | ORAL | Status: DC
  Administered 2017-11-08: 300 mg via ORAL
  Filled 2017-11-08: qty 1

## 2017-11-08 MED ORDER — VENLAFAXINE HCL ER 75 MG PO CP24
75.0000 mg | ORAL_CAPSULE | Freq: Every day | ORAL | Status: DC
  Administered 2017-11-08: 75 mg via ORAL
  Filled 2017-11-08: qty 1

## 2017-11-08 MED ORDER — IBUPROFEN 800 MG PO TABS
800.0000 mg | ORAL_TABLET | Freq: Once | ORAL | Status: AC
  Administered 2017-11-08: 800 mg via ORAL
  Filled 2017-11-08: qty 1

## 2017-11-08 MED ORDER — VITAMIN B-1 100 MG PO TABS
100.0000 mg | ORAL_TABLET | Freq: Every day | ORAL | Status: DC
  Administered 2017-11-09 – 2017-11-10 (×2): 100 mg via ORAL
  Filled 2017-11-08 (×5): qty 1

## 2017-11-08 MED ORDER — ACETAMINOPHEN 325 MG PO TABS: 650.0000 mg | ORAL_TABLET | Freq: Four times a day (QID) | ORAL | Status: DC | PRN

## 2017-11-08 MED ORDER — MAGNESIUM HYDROXIDE 400 MG/5ML PO SUSP: 30.0000 mL | Freq: Every day | ORAL | Status: DC | PRN

## 2017-11-08 MED ORDER — ONDANSETRON 4 MG PO TBDP: 4.0000 mg | ORAL_TABLET | Freq: Four times a day (QID) | ORAL | Status: DC | PRN

## 2017-11-08 MED ORDER — LORAZEPAM 1 MG PO TABS: 1.0000 mg | ORAL_TABLET | Freq: Four times a day (QID) | ORAL | Status: DC | PRN

## 2017-11-08 NOTE — ED Notes (Signed)
[facility] RN not available at this time.
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
## 2017-11-08 NOTE — ED Notes (Signed)
[facility]. All belongings returned. Pt calm and cooeprative.
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
## 2017-11-08 NOTE — ED Notes (Signed)
[facility]. [facility] called for transport.

## 2017-11-08 NOTE — BH Assessment (Signed)
[facility]  Per Juanetta BeetsJacqueline Norman, DO, this pt requires psychiatric hospitalization at this time.  Malva LimesLinsey Strader, RN, [id number]; [facility] will be ready to receive pt between 11:30 and 12:00.  Pt has signed Voluntary Admission and Consent for Treatment, as well as Consent to Release Information to no one, and signed forms have [facility].  Pt's nurse, Diane, has been notified, and agrees to send original paperwork along with pt via [facility] [place], and to call report to [phone].  Doylene Canninghomas Marietta Sikkema, [place] Behavioral Health Coordinator [phone]

## 2017-11-08 NOTE — Progress Notes (Signed)
[facility] Group Notes:  (Nursing/MHT/Case Management/Adjunct)  Date:  11/08/2017  Time:  4:00 PM   Type of Therapy:  Psychoeducational Skills  Participation Level:  Did Not Attend    Summary of Progress/Problems: Patient was invided but did not attend.  [name] 11/08/2017, 5:10 PM 

## 2017-11-09 ENCOUNTER — Encounter (HOSPITAL_COMMUNITY): Payer: Self-pay | Admitting: Behavioral Health

## 2017-11-09 DIAGNOSIS — F333 Major depressive disorder, recurrent, severe with psychotic symptoms: Principal | ICD-10-CM

## 2017-11-09 DIAGNOSIS — F1010 Alcohol abuse, uncomplicated: Secondary | ICD-10-CM

## 2017-11-09 DIAGNOSIS — R45851 Suicidal ideations: Secondary | ICD-10-CM

## 2017-11-09 DIAGNOSIS — Z811 Family history of alcohol abuse and dependence: Secondary | ICD-10-CM

## 2017-11-09 DIAGNOSIS — Z818 Family history of other mental and behavioral disorders: Secondary | ICD-10-CM

## 2017-11-09 MED ORDER — GABAPENTIN 300 MG PO CAPS
300.0000 mg | ORAL_CAPSULE | Freq: Three times a day (TID) | ORAL | Status: DC
  Administered 2017-11-09 – 2017-11-10 (×2): 300 mg via ORAL
  Filled 2017-11-09 (×2): qty 1
  Filled 2017-11-09 (×3): qty 21
  Filled 2017-11-09 (×4): qty 1

## 2017-11-09 MED ORDER — VENLAFAXINE HCL ER 75 MG PO CP24
75.0000 mg | ORAL_CAPSULE | Freq: Every day | ORAL | Status: DC
Start: 2017-11-10 — End: 2017-11-10
  Administered 2017-11-10: 75 mg via ORAL
  Filled 2017-11-09: qty 7
  Filled 2017-11-09 (×3): qty 1

## 2017-11-09 MED ORDER — HYDROXYZINE HCL 25 MG PO TABS
25.0000 mg | ORAL_TABLET | Freq: Four times a day (QID) | ORAL | Status: DC | PRN
  Administered 2017-11-09: 25 mg via ORAL
  Filled 2017-11-09: qty 1
  Filled 2017-11-09: qty 10
  Filled 2017-11-09: qty 1

## 2017-11-09 MED ORDER — GABAPENTIN 600 MG PO TABS
300.0000 mg | ORAL_TABLET | Freq: Once | ORAL | Status: AC
  Administered 2017-11-09: 300 mg via ORAL
  Filled 2017-11-09: qty 0.5

## 2017-11-09 MED ORDER — NICOTINE 21 MG/24HR TD PT24
21.0000 mg | MEDICATED_PATCH | Freq: Every day | TRANSDERMAL | Status: DC
  Administered 2017-11-09 – 2017-11-10 (×2): 21 mg via TRANSDERMAL
  Filled 2017-11-09 (×4): qty 1

## 2017-11-09 MED ORDER — NICOTINE POLACRILEX 2 MG MT GUM
2.0000 mg | CHEWING_GUM | OROMUCOSAL | Status: DC | PRN
  Administered 2017-11-09: 2 mg via ORAL

## 2017-11-09 MED ORDER — GABAPENTIN 100 MG PO CAPS
100.0000 mg | ORAL_CAPSULE | Freq: Three times a day (TID) | ORAL | Status: DC
  Administered 2017-11-09: 100 mg via ORAL
  Filled 2017-11-09 (×3): qty 1

## 2017-11-09 MED ORDER — BOOST / RESOURCE BREEZE PO LIQD CUSTOM
1.0000 | Freq: Three times a day (TID) | ORAL | Status: DC
  Administered 2017-11-09 (×3): 1 via ORAL
  Filled 2017-11-09 (×10): qty 1

## 2017-11-09 MED ORDER — VENLAFAXINE HCL ER 37.5 MG PO CP24
37.5000 mg | ORAL_CAPSULE | Freq: Every day | ORAL | Status: DC
  Administered 2017-11-09: 37.5 mg via ORAL
  Filled 2017-11-09 (×3): qty 1
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
## 2017-11-09 NOTE — BHH Group Notes (Signed)
LCSW Group Therapy Note   11/09/2017 1:15pm   Type of Therapy and Topic:  Group Therapy:  Overcoming Obstacles   Participation Level:  Did Not Attend--pt invited. Chose to remain in bed.    Description of Group:    In this group patients will be encouraged to explore what they see as obstacles to their own wellness and recovery. They will be guided to discuss their thoughts, feelings, and behaviors related to these obstacles. The group will process together ways to cope with barriers, with attention given to specific choices patients can make. Each patient will be challenged to identify changes they are motivated to make in order to overcome their obstacles. This group will be process-oriented, with patients participating in exploration of their own experiences as well as giving and receiving support and challenge from other group members.   Therapeutic Goals: 1. Patient will identify personal and current obstacles as they relate to admission. 2. Patient will identify barriers that currently interfere with their wellness or overcoming obstacles.  3. Patient will identify feelings, thought process and behaviors related to these barriers. 4. Patient will identify two changes they are willing to make to overcome these obstacles:      Summary of Patient Progress x     Therapeutic Modalities:   Cognitive Behavioral Therapy Solution Focused Therapy Motivational Interviewing Relapse Prevention Therapy  Rona RavensHeather S Krisy Dix, LCSW 11/09/2017 1:00 PM

## 2017-11-09 NOTE — Progress Notes (Signed)
D: Pt was in bed in her room upon initial approach.  Pt presents with depressed affect and mood.  She describes her mood as "depressed, hopeless."  She reports she is "looking for a rehab center to help me get a job and get on the right track."  Pt denies SI/HI and hallucinations.  She reports L clavicle pain of 8/10.  Pt has been in her room for the majority of the evening.  Few peer interactions.  A: Introduced self to pt.  Actively listened to pt and provided support and encouragement.  Medication administered per order.  PRN medication administered for indigestion.  Q15 minute safety checks maintained.  R: Pt is compliant with medications.  She verbally contracts for safety.  Will continue to monitor and assess.
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
## 2017-11-09 NOTE — [facility] Group Notes (Signed)
Pt did not attend wrap up group this evening. Pt stayed in bed instead.  

## 2017-11-09 NOTE — Plan of Care (Signed)
  Problem: Education: Goal: Verbalization of understanding the information provided will improve Outcome: Progressing   Problem: Coping: Goal: Ability to demonstrate self-control will improve Outcome: Progressing   Problem: Health Behavior/Discharge Planning: Goal: Ability to identify changes in lifestyle to reduce recurrence of condition will improve Outcome: Not Progressing   Problem: Education: Goal: Knowledge of disease or condition will improve Outcome: Not Progressing

## 2017-11-09 NOTE — [facility] Suicide Risk Assessment (Signed)
[facility] INPATIENT:  Family/Significant Other Suicide Prevention Education  Suicide Prevention Education:  Patient Refusal for Family/Significant Other Suicide Prevention Education: The patient [name] has refused to provide written consent for family/significant other to be provided Family/Significant Other Suicide Prevention Education during admission and/or prior to discharge.  Physician notified.  SPE completed with pt, as pt refused to consent to family contact. SPI pamphlet provided to pt and pt was encouraged to share information with support network, ask questions, and talk about any concerns relating to SPE. Pt denies access to guns/firearms and verbalized understanding of information provided. Mobile Crisis information also provided to pt.   Ethel RanaHeather S Jilian West LCSW 11/09/2017, 10:20 AM

## 2017-11-10 DIAGNOSIS — R45851 Suicidal ideations: Secondary | ICD-10-CM

## 2017-11-10 DIAGNOSIS — F1010 Alcohol abuse, uncomplicated: Secondary | ICD-10-CM

## 2017-11-10 DIAGNOSIS — F332 Major depressive disorder, recurrent severe without psychotic features: Secondary | ICD-10-CM

## 2017-11-10 LAB — HEMOGLOBIN A1C
HEMOGLOBIN A1C: 5.3 % (ref 4.8–5.6)
Mean Plasma Glucose: 105.41 mg/dL

## 2017-11-10 LAB — TSH: TSH: 1.633 u[IU]/mL (ref 0.350–4.500)

## 2017-11-10 LAB — LIPID PANEL
CHOL/HDL RATIO: 3.7 ratio
CHOLESTEROL: 131 mg/dL (ref 0–200)
HDL: 35 mg/dL — ABNORMAL LOW (ref >40)
LDL Cholesterol: 64 mg/dL (ref 0–99)
Triglycerides: 159 mg/dL — ABNORMAL HIGH (ref <150)
VLDL: 32 mg/dL (ref 0–40)

## 2017-11-10 MED ORDER — VENLAFAXINE HCL ER 75 MG PO CP24: 75.0000 mg | ORAL_CAPSULE | Freq: Every day | ORAL | 0 refills | Status: DC

## 2017-11-10 MED ORDER — TRAZODONE HCL 50 MG PO TABS: 50.0000 mg | ORAL_TABLET | Freq: Every evening | ORAL | 0 refills | Status: DC | PRN

## 2017-11-10 MED ORDER — HYDROXYZINE HCL 25 MG PO TABS: 25.0000 mg | ORAL_TABLET | Freq: Four times a day (QID) | ORAL | 0 refills | Status: DC | PRN

## 2017-11-10 MED ORDER — NICOTINE 21 MG/24HR TD PT24: 21.0000 mg | MEDICATED_PATCH | Freq: Every day | TRANSDERMAL | 0 refills | Status: DC

## 2017-11-10 MED ORDER — GABAPENTIN 300 MG PO CAPS: 300.0000 mg | ORAL_CAPSULE | Freq: Three times a day (TID) | ORAL | 0 refills | Status: DC

## 2017-11-10 NOTE — Progress Notes (Signed)
  [facility] :  Will you be returning to the same living situation after discharge:  Yes,  home At discharge, do you have transportation home?: Yes,  pt is arranging her own transpotation and is calling various people requesting transport home at discharge Do you have the ability to pay for your medications: Yes,  mental health  Release of information consent forms completed and submitted to medical records by CSW.  Patient to Follow up at: Follow-up Information    Services, [facility] Follow up on 11/14/2017.   Why:  Hospital follow-up on Tuesday, 8/20 at 8:40AM. Please bring the following if you have them: Photo ID, social security card, any proof of income if you have it, and hospital discharge paperwork. Thank you. Contact information: [address] [place] [id number] [phone]           Next level of care provider [facility] Link:no  Safety Planning and Suicide Prevention discussed: Yes,  SPE completed with pt; pt declined to consent to collateral contact. SPi pamphlet and Mobile Crisis information provided.   Have you used any form of tobacco in the last 30 days? (Cigarettes, Smokeless Tobacco, Cigars, and/or Pipes): Yes  Has patient been referred to the Quitline?: Patient refused referral  Patient has been referred for addiction treatment: Yes  Rona RavensHeather S Jailani Hogans, LCSW 11/10/2017, 9:30 AM
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
## 2017-11-10 NOTE — Progress Notes (Signed)
Patient ID: [name], female   DOB: 1976/02/20, [age] y.o.   MRN: [id number]  Pt. Discharged per MD orders;  PT. Currently denies any HI/SI or AVH.  Pt. Was given education regarding follow up appointments and medications by RN.  Pt. Denies any questions or concerns about the medications.  Pt. Was escorted to the search room to retrieve her belongings by RN before being discharged to the hospital lobby.

## 2017-11-10 NOTE — [facility] Suicide Risk Assessment (Signed)
[facility]   Principal Problem: Severe recurrent major depression with psychotic features [facility](HCC) Discharge Diagnoses:  Patient Active Problem List   Diagnosis Date Noted  . Alcohol abuse [F10.10] 11/09/2017  . Suicidal thoughts [R45.851] 11/09/2017  . Major depressive disorder, recurrent severe without psychotic features (HCC) [F33.2] 11/08/2017  . Severe recurrent major depression with psychotic features (HCC) [F33.3] 11/08/2017  . Right thigh pain [M79.651] 11/21/2015  . Finger injury [S69.90XA] 11/21/2015  . 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 Abdominal pain, epigastric [R10.13] 12/10/2014  . Tachycardia [R00.0] 11/17/2014  . HTN (hypertension) [I10] 11/17/2014  . 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 Facial droop [R29.810] 10/22/2013  . Fracture of clavicle, left, closed [S42.002A] 08/30/2012  . Left shoulder pain [M25.512] 06/18/2012  . Bipolar disorder (HCC) [F31.9] 03/24/2012  . Nausea [R11.0] 03/23/2012  . Chronic hepatitis C without hepatic coma (HCC) [B18.2] 02/21/2012  . Elevated liver enzymes [R74.8] 02/20/2012  . Asthma [J45.909] 02/20/2012  . Right knee pain [M25.561] 05/17/2011  . Emphysema [J43.9]     Total Time spent with patient: 15 minutes  Musculoskeletal: Strength & Muscle Tone: within normal limits Gait & Station: normal Patient leans: N/A  Psychiatric Specialty Exam: Review of Systems  All other systems reviewed and are negative.   Blood pressure 112/71, pulse 91, temperature 97.9 F (36.6 C), temperature source Oral, resp. rate 16, height 5\' 6"  (1.676 m), weight 56.7 kg, SpO2 98 %.Body mass index is 20.18 kg/m.  General Appearance: Casual  Eye Contact::  Good  Speech:  Normal Rate409  Volume:  Normal  Mood:  Anxious  Affect:  Congruent  Thought Process:  Coherent  Orientation:  Full (Time, Place, and Person)  Thought Content:  Logical  Suicidal Thoughts:  No  Homicidal Thoughts:  No  Memory:   Immediate;   Fair Recent;   Fair Remote;   Fair  Judgement:  Intact  Insight:  Lacking  Psychomotor Activity:  Increased  Concentration:  Fair  Recall:  FiservFair  Fund of Knowledge:Fair  Language: Fair  Akathisia:  Negative  Handed:  Right  AIMS (if indicated):     Assets:  Desire for Improvement Housing Physical Health Resilience  Sleep:  Number of Hours: 6.75  Cognition: WNL  ADL's:  Intact   Mental Status Per Nursing Assessment::   On Admission:  Suicidal ideation indicated by patient, Suicide plan, Plan includes specific time, place, or method, Self-harm thoughts, Self-harm behaviors, Intention to act on suicide plan  Demographic Factors:  Divorced or widowed, Caucasian, Low socioeconomic status and Unemployed  Loss Factors: NA  Historical Factors: Impulsivity  Risk Reduction Factors:   Living with another person, especially a relative  Continued Clinical Symptoms:  Depression:   Impulsivity Alcohol/Substance Abuse/Dependencies  Cognitive Features That Contribute To Risk:  None    Suicide Risk:  Minimal: No identifiable suicidal ideation.  Patients presenting with no risk factors but with morbid ruminations; may be classified as minimal risk based on the severity of the depressive symptoms  Follow-up Information    [facility] Follow up on 11/14/2017.   Why:  Hospital follow-up on Tuesday, 8/20 at 8:40AM. Please bring the following if you have them: Photo ID, social security card, any proof of income if you have it, and hospital discharge paperwork. Thank you. Contact information: [address] [place] [address] [phone]           Plan Of Care/Follow-up recommendations:  Activity:  ad lib  Marlane MingleGreg Lawson  Jola Babinskilary, MD 11/10/2017, 9:20 AM

## 2017-12-11 IMAGING — RF DG CERVICAL SPINE 2 OR 3 VIEWS
1 series · 2 of 2 positions shown · non-contrast
Comparison: MRI of cervical spine February 17, 2015

CLINICAL DATA: Cervical 4 through 6 ACDF.

EXAM:
DG C-ARM 61-120 MIN; CERVICAL SPINE - 2-3 VIEW fluoroscopic time 13
seconds

[Series 1: run · 2 of 2 slices shown]
[im 1/2]
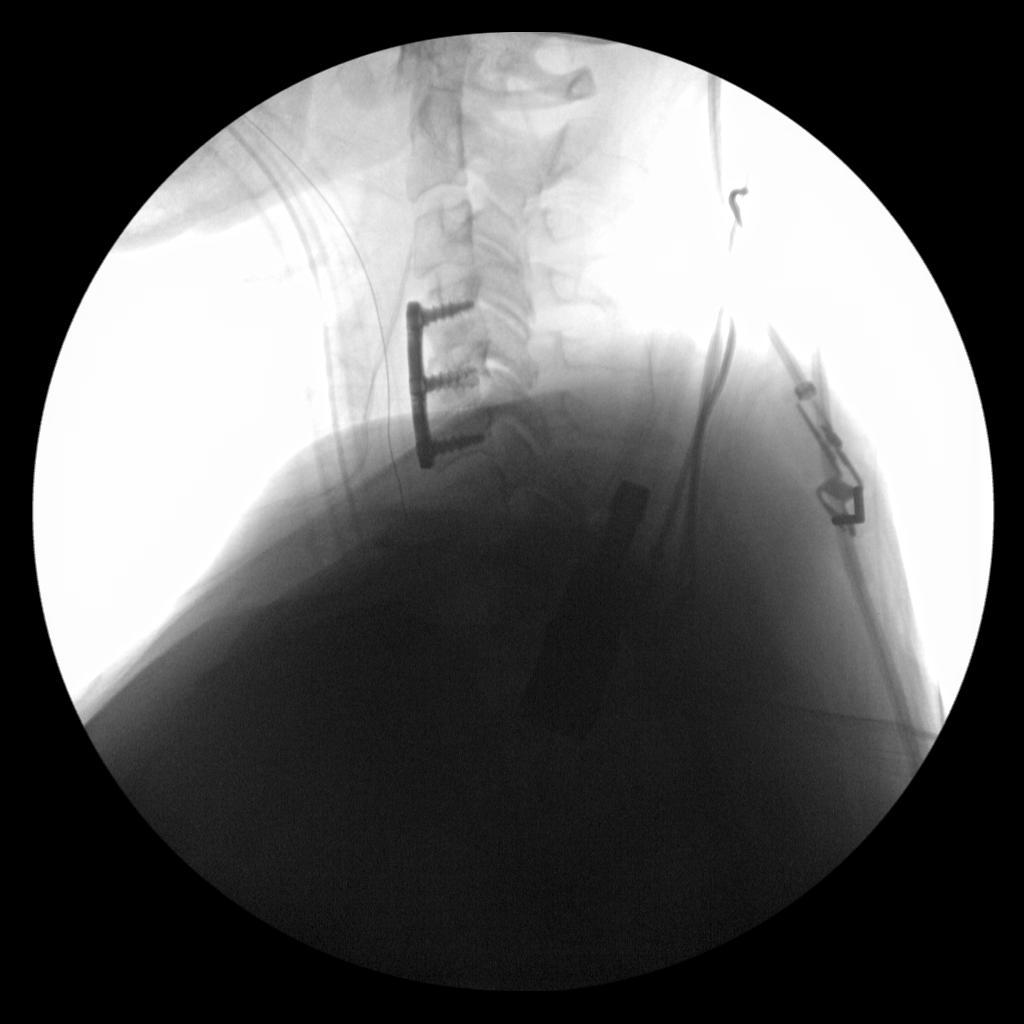
[im 2/2]
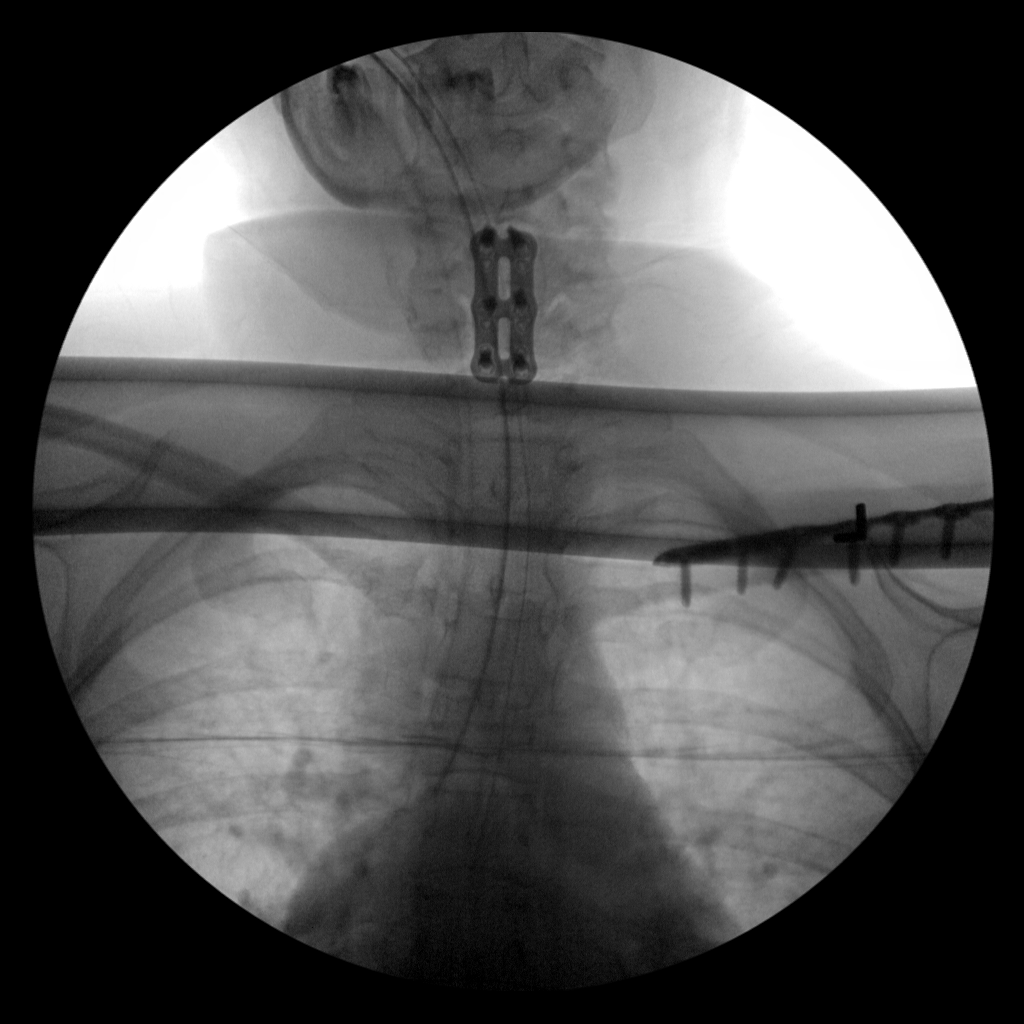

[2 of 2 positions shown; findings below may reference images not displayed]

FINDINGS: Fluoroscopic images demonstrate anterior fusion of C4 through C6
with anterior side plate fixated by horizontal screws without
malalignment.
IMPRESSION: Anterior fusion of C4 through C6 without malalignment.

## 2018-01-14 IMAGING — CR DG RIBS 2V*R*
3 series · 4 of 4 positions shown · non-contrast
Comparison: PA and lateral chest x-ray November 18, 2014

CLINICAL DATA: Syncopal episode 1 week ago landing on the right
side with persistent right lower posterior rib pain; history of
previous right posterior seventh rib fracture twice as well as left
clavicular fracture which was treated with ORIF, history of asthma
-COPD.

EXAM:
RIGHT RIBS - 2 VIEW

[rib pa]
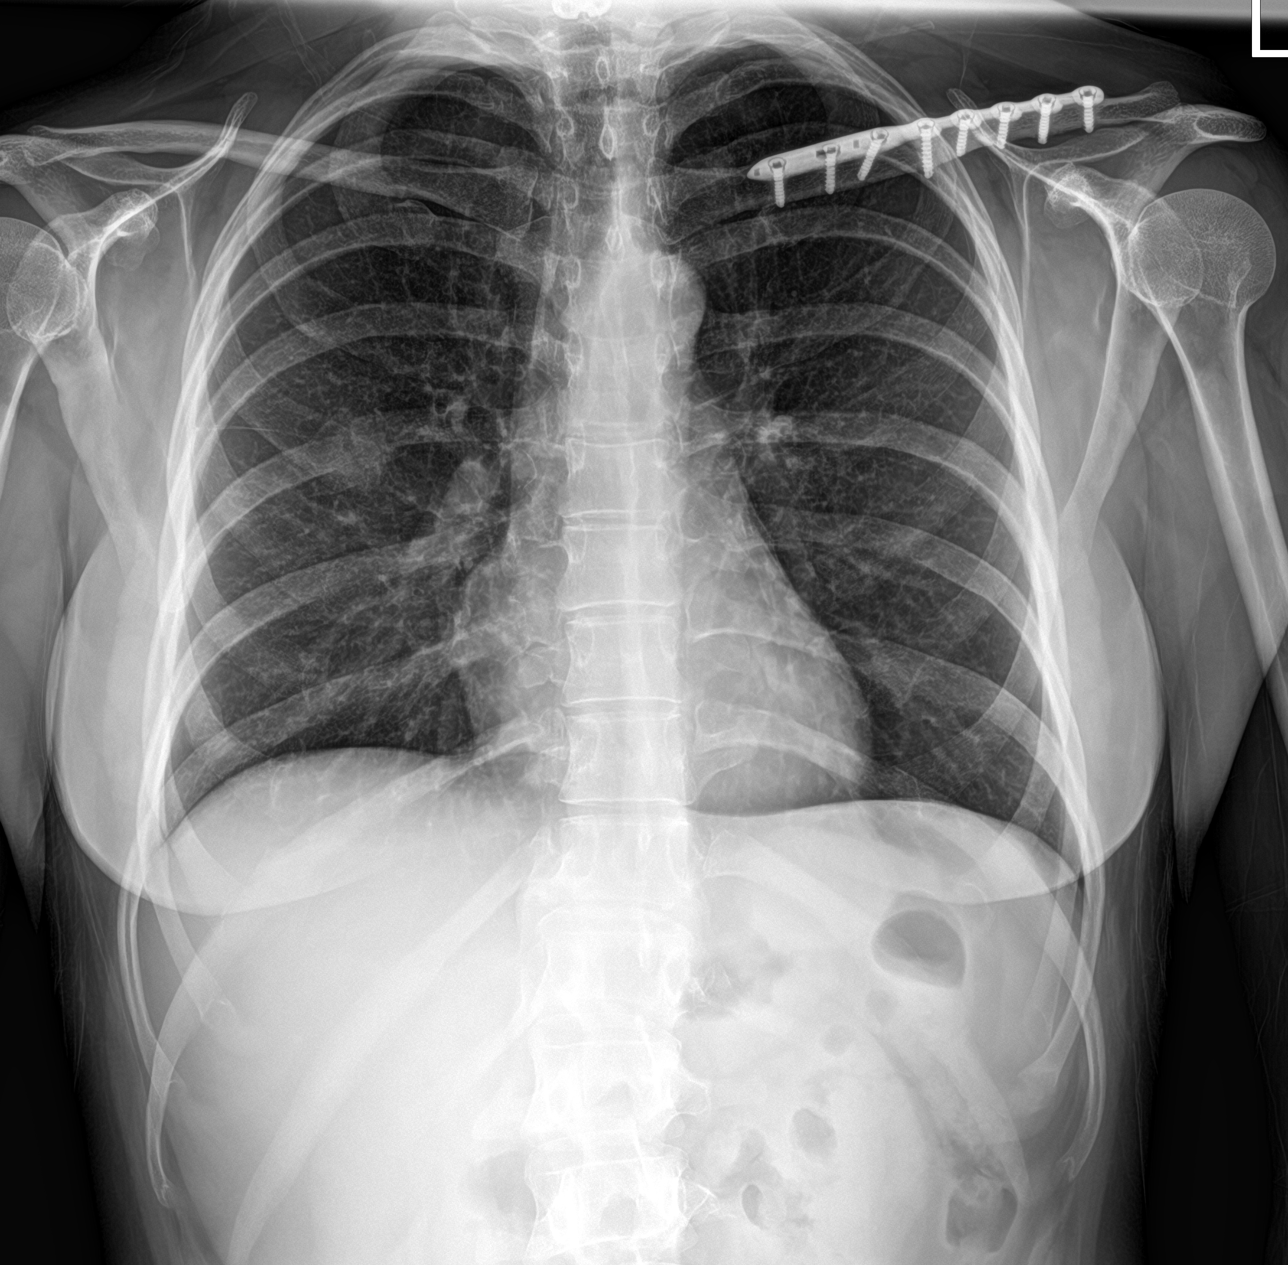

[rib ap]
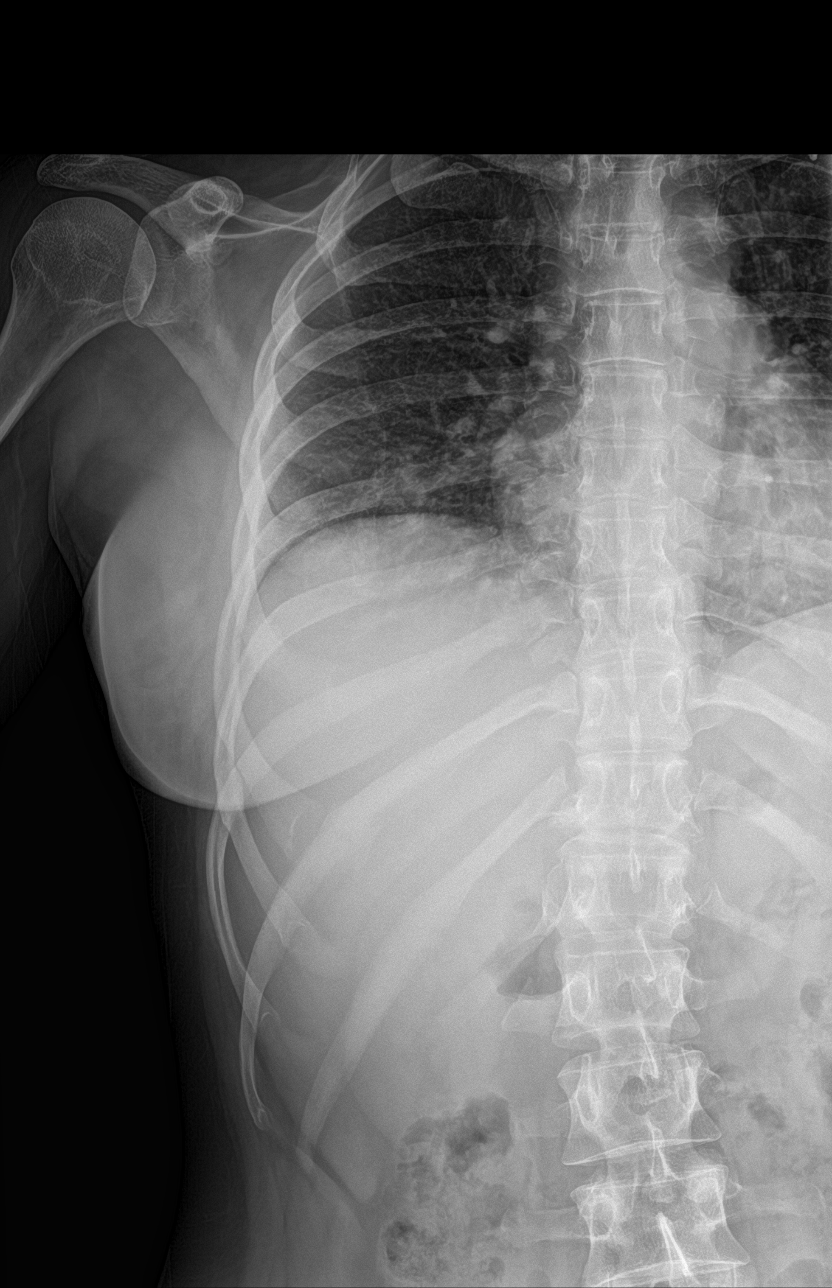

[Series 5: rib ap obl · 0.14mm/px · 2 of 2 slices shown]
[im 1/2]
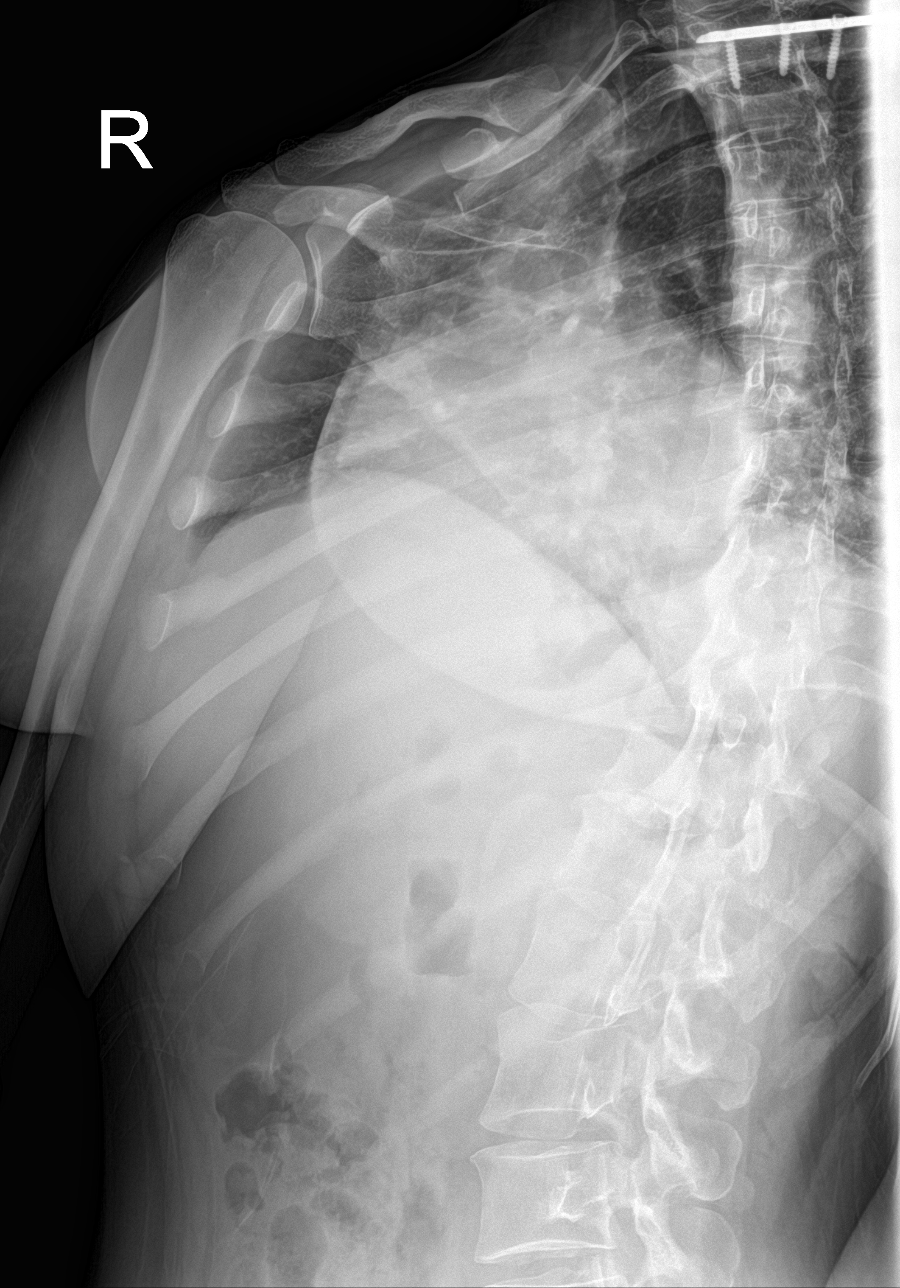
[im 2/2]
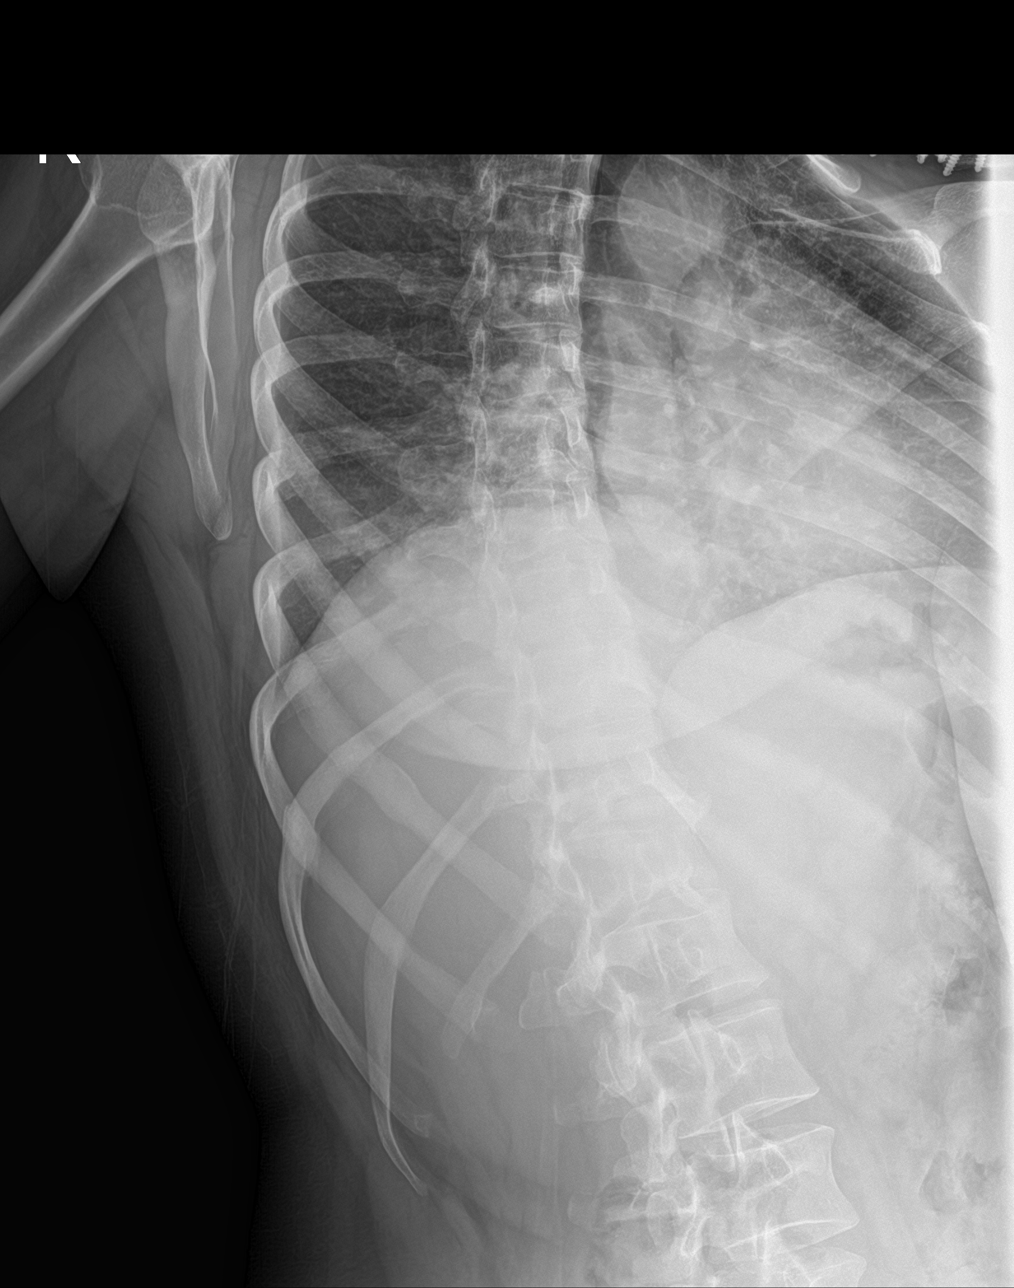

[4 of 4 positions shown; findings below may reference images not displayed]

FINDINGS: The lungs are well-expanded. There is periosteal reaction cloaking a
fracture of the posterior aspect of the right seventh rib. This is
new since the November 18, 2014 exam. There are old fractures of the
lateral aspects of the right eighth and tenth ribs. The heart is
normal in size. The pulmonary vascularity is not engorged. There is
no pleural effusion or pneumothorax. A left clavicular fracture
treated with ORIF is in good anatomic alignment.
IMPRESSION: 1. Acute healing fracture of the posterior aspect of the right
seventh rib. Old fractures of the lateral aspects of the right
eighth and tenth ribs are present.
2. Mild stable interstitial prominence consistent with known
reactive airway disease. There is no pneumonia nor CHF.

## 2018-06-06 IMAGING — CT CT CERVICAL SPINE W/O CM
3 series · 8 of 14 positions shown, 9 images · non-contrast
Comparison: Radiography 10/05/2015.  MRI 02/17/2015.

CLINICAL DATA: Previous ACDF Sunday March, 2015. Neck pain and
right-sided extremity pain, numbness in weakness.

EXAM:
CT CERVICAL SPINE WITHOUT CONTRAST
TECHNIQUE: Multidetector CT imaging of the cervical spine was performed without
intravenous contrast. Multiplanar CT image reconstructions were also
generated.

[Series 2: cspine soft (person_name) · axial · 0.23mm/px · z∈[-188,-122]mm · 2 of 100 slices shown]
[im 34/100  soft-tissue]
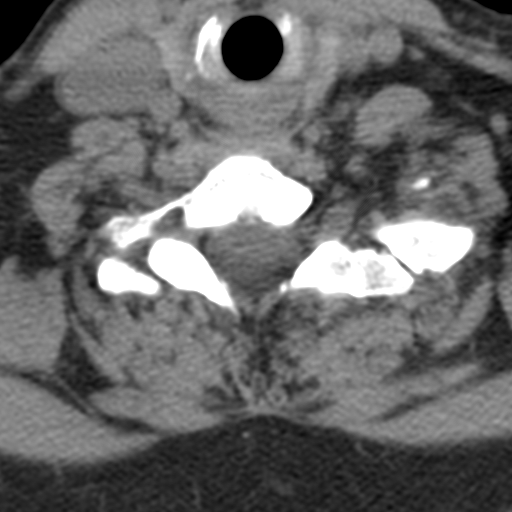
[im 67/100  soft-tissue]
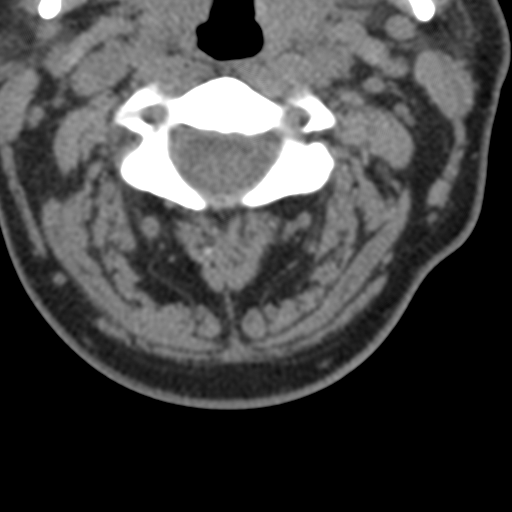

[Series 3: c spine bone (person_name) · axial · 0.23mm/px · z∈[-206,-106]mm · 3 of 100 slices shown, 4 images]
[im 25/100  soft-tissue]
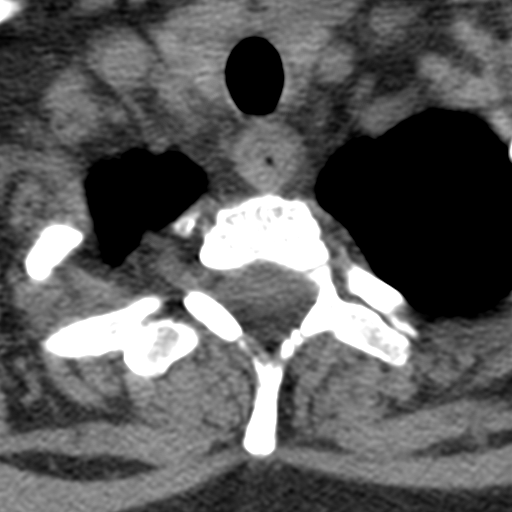
[im 25/100  bone]
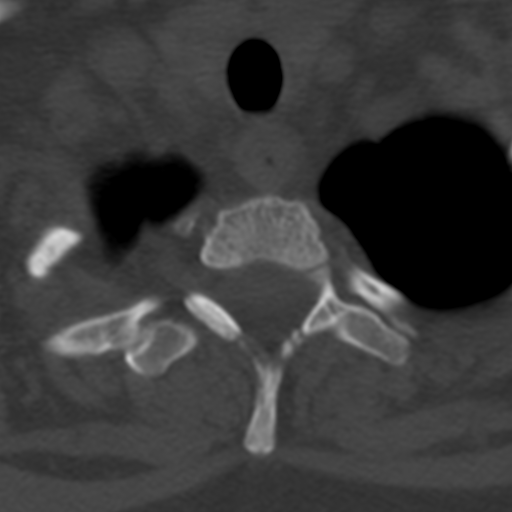
[im 50/100  bone]
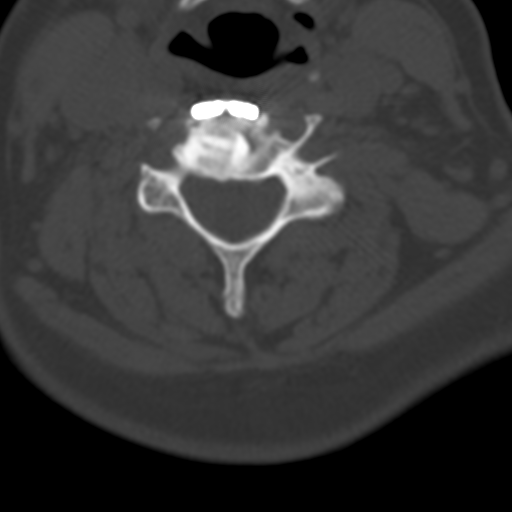
[im 75/100  bone]
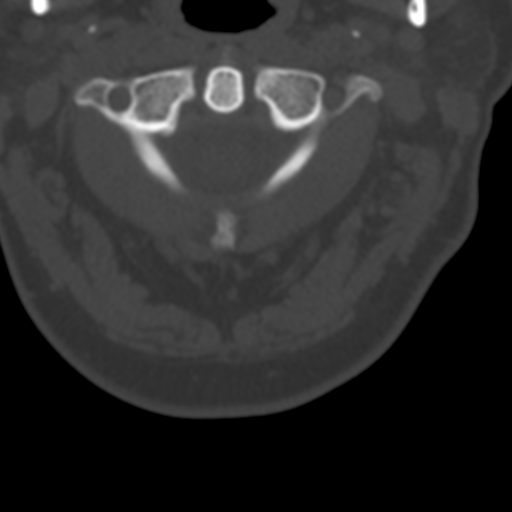

[Series 8: angled axial (person_name) · axial · 0.23mm/px · z∈[-222,-124]mm · 3 of 101 slices shown]
[im 26/101  bone]
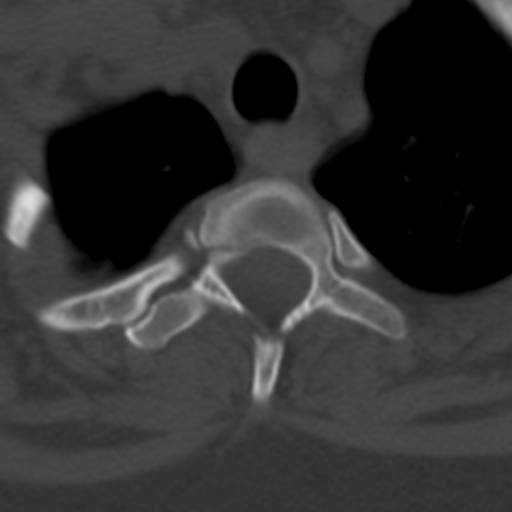
[im 51/101  bone]
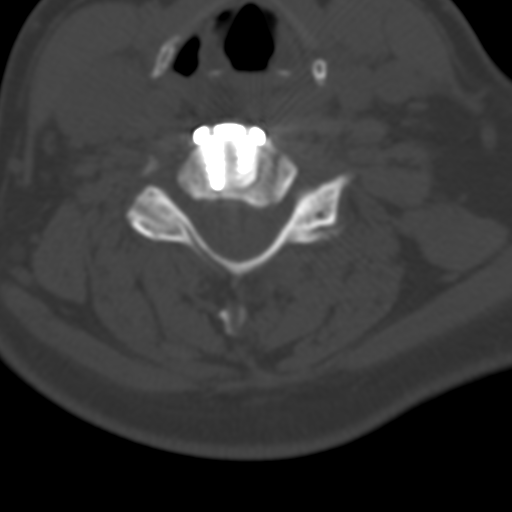
[im 76/101  bone]
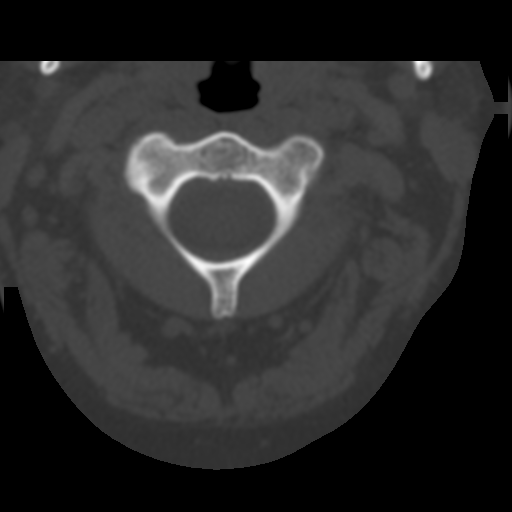

[8 of 14 positions shown; findings below may reference images not displayed]

FINDINGS: Foramen magnum is widely patent.  C1-2 shows mild osteoarthritis.

C2-3:  Normal interspace.

C3-4: Mild uncovertebral hypertrophy. Facet degeneration on the
left. No central canal stenosis. Foraminal narrowing on the left
that could affect the C4 nerve root.

C4 through C6: Previous ACDF. Fusion is probably solid. No evidence
of hardware loosening. Wide patency of the canal. Mild foraminal
encroachment by osteophytes, not likely significant.

C6-7:  Normal interspace.

C7-T1:  Normal interspace
IMPRESSION: Satisfactory appearance in the fusion segment from C4 through C6.

Adjacent segment degenerative spondylosis at C3-4 with uncovertebral
and facet osteophytes on the left cause some narrowing of the
intervertebral foramen and could possibly affect the left C4 nerve
root. However, the described symptoms are on the right.

No evidence of central canal compromise in the region.
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
## 2018-10-25 ENCOUNTER — Encounter (HOSPITAL_BASED_OUTPATIENT_CLINIC_OR_DEPARTMENT_OTHER): Payer: Self-pay

## 2018-10-25 ENCOUNTER — Emergency Department (HOSPITAL_BASED_OUTPATIENT_CLINIC_OR_DEPARTMENT_OTHER)
Admission: EM | Admit: 2018-10-25 | Discharge: 2018-10-25 | Disposition: A | Discharge status: Home / Self Care | Payer: Medicaid Other | Attending: Emergency Medicine | Admitting: Emergency Medicine

## 2018-10-25 ENCOUNTER — Other Ambulatory Visit: Payer: Self-pay

## 2018-10-25 DIAGNOSIS — Z113 Encounter for screening for infections with a predominantly sexual mode of transmission: Secondary | ICD-10-CM | POA: Insufficient documentation

## 2018-10-25 DIAGNOSIS — Z5321 Procedure and treatment not carried out due to patient leaving prior to being seen by health care provider: Secondary | ICD-10-CM | POA: Insufficient documentation

## 2018-10-25 NOTE — ED Triage Notes (Addendum)
Pt states she feels she is having genital herpes outbreak-requesting rx-NAD-steady gait

## 2019-01-20 ENCOUNTER — Emergency Department (HOSPITAL_BASED_OUTPATIENT_CLINIC_OR_DEPARTMENT_OTHER): Payer: Self-pay

## 2019-01-20 ENCOUNTER — Other Ambulatory Visit: Payer: Self-pay

## 2019-01-20 ENCOUNTER — Encounter (HOSPITAL_BASED_OUTPATIENT_CLINIC_OR_DEPARTMENT_OTHER): Payer: Self-pay | Admitting: Emergency Medicine

## 2019-01-20 ENCOUNTER — Emergency Department (HOSPITAL_BASED_OUTPATIENT_CLINIC_OR_DEPARTMENT_OTHER)
Admission: EM | Admit: 2019-01-20 | Discharge: 2019-01-20 | Disposition: A | Discharge status: Home / Self Care | Payer: Self-pay | Attending: Emergency Medicine | Admitting: Emergency Medicine

## 2019-01-20 DIAGNOSIS — Z79899 Other long term (current) drug therapy: Secondary | ICD-10-CM | POA: Insufficient documentation

## 2019-01-20 DIAGNOSIS — F17210 Nicotine dependence, cigarettes, uncomplicated: Secondary | ICD-10-CM | POA: Insufficient documentation

## 2019-01-20 DIAGNOSIS — J449 Chronic obstructive pulmonary disease, unspecified: Secondary | ICD-10-CM | POA: Insufficient documentation

## 2019-01-20 DIAGNOSIS — Z882 Allergy status to sulfonamides status: Secondary | ICD-10-CM | POA: Insufficient documentation

## 2019-01-20 DIAGNOSIS — M7918 Myalgia, other site: Secondary | ICD-10-CM

## 2019-01-20 DIAGNOSIS — Y939 Activity, unspecified: Secondary | ICD-10-CM | POA: Insufficient documentation

## 2019-01-20 DIAGNOSIS — M797 Fibromyalgia: Secondary | ICD-10-CM | POA: Insufficient documentation

## 2019-01-20 DIAGNOSIS — Y999 Unspecified external cause status: Secondary | ICD-10-CM | POA: Insufficient documentation

## 2019-01-20 DIAGNOSIS — Y929 Unspecified place or not applicable: Secondary | ICD-10-CM | POA: Insufficient documentation

## 2019-01-20 DIAGNOSIS — S0083XA Contusion of other part of head, initial encounter: Secondary | ICD-10-CM

## 2019-01-20 DIAGNOSIS — I10 Essential (primary) hypertension: Secondary | ICD-10-CM | POA: Insufficient documentation

## 2019-01-20 MED ORDER — HYDROCODONE-ACETAMINOPHEN 5-325 MG PO TABS
1.0000 | ORAL_TABLET | Freq: Once | ORAL | Status: AC
  Administered 2019-01-20: 1 via ORAL
  Filled 2019-01-20: qty 1

## 2019-01-20 MED ORDER — LORAZEPAM 1 MG PO TABS
0.5000 mg | ORAL_TABLET | Freq: Once | ORAL | Status: AC
  Administered 2019-01-20: 17:00:00 0.5 mg via ORAL
  Filled 2019-01-20: qty 1

## 2019-01-20 MED ORDER — HYDROCODONE-ACETAMINOPHEN 5-325 MG PO TABS: 1.0000 | ORAL_TABLET | Freq: Four times a day (QID) | ORAL | 0 refills | Status: DC | PRN

## 2019-01-20 MED ORDER — METHOCARBAMOL 500 MG PO TABS: 1000.0000 mg | ORAL_TABLET | Freq: Four times a day (QID) | ORAL | 0 refills | Status: DC
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
## 2019-01-20 NOTE — ED Notes (Signed)
ED Provider at bedside. 
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
## 2019-03-03 ENCOUNTER — Emergency Department (HOSPITAL_BASED_OUTPATIENT_CLINIC_OR_DEPARTMENT_OTHER)
Admission: EM | Admit: 2019-03-03 | Discharge: 2019-03-03 | Disposition: A | Discharge status: Home / Self Care | Payer: Self-pay | Attending: Emergency Medicine | Admitting: Emergency Medicine

## 2019-03-03 ENCOUNTER — Emergency Department (HOSPITAL_BASED_OUTPATIENT_CLINIC_OR_DEPARTMENT_OTHER): Payer: Self-pay

## 2019-03-03 ENCOUNTER — Other Ambulatory Visit: Payer: Self-pay

## 2019-03-03 ENCOUNTER — Encounter (HOSPITAL_BASED_OUTPATIENT_CLINIC_OR_DEPARTMENT_OTHER): Payer: Self-pay | Admitting: Emergency Medicine

## 2019-03-03 DIAGNOSIS — N760 Acute vaginitis: Secondary | ICD-10-CM | POA: Insufficient documentation

## 2019-03-03 DIAGNOSIS — B9689 Other specified bacterial agents as the cause of diseases classified elsewhere: Secondary | ICD-10-CM

## 2019-03-03 DIAGNOSIS — Z91012 Allergy to eggs: Secondary | ICD-10-CM | POA: Insufficient documentation

## 2019-03-03 DIAGNOSIS — Z79899 Other long term (current) drug therapy: Secondary | ICD-10-CM | POA: Insufficient documentation

## 2019-03-03 DIAGNOSIS — N3000 Acute cystitis without hematuria: Secondary | ICD-10-CM | POA: Insufficient documentation

## 2019-03-03 DIAGNOSIS — M542 Cervicalgia: Secondary | ICD-10-CM | POA: Insufficient documentation

## 2019-03-03 DIAGNOSIS — J449 Chronic obstructive pulmonary disease, unspecified: Secondary | ICD-10-CM | POA: Insufficient documentation

## 2019-03-03 DIAGNOSIS — R42 Dizziness and giddiness: Secondary | ICD-10-CM | POA: Insufficient documentation

## 2019-03-03 DIAGNOSIS — F17210 Nicotine dependence, cigarettes, uncomplicated: Secondary | ICD-10-CM | POA: Insufficient documentation

## 2019-03-03 DIAGNOSIS — Z91011 Allergy to milk products: Secondary | ICD-10-CM | POA: Insufficient documentation

## 2019-03-03 DIAGNOSIS — I10 Essential (primary) hypertension: Secondary | ICD-10-CM | POA: Insufficient documentation

## 2019-03-03 DIAGNOSIS — M797 Fibromyalgia: Secondary | ICD-10-CM | POA: Insufficient documentation

## 2019-03-03 LAB — URINALYSIS, ROUTINE W REFLEX MICROSCOPIC
Bilirubin Urine: NEGATIVE
Glucose, UA: NEGATIVE mg/dL
Hgb urine dipstick: NEGATIVE
Ketones, ur: NEGATIVE mg/dL
Nitrite: NEGATIVE
Protein, ur: 30 mg/dL — AB
Specific Gravity, Urine: 1.02 (ref 1.005–1.030)
pH: 7 (ref 5.0–8.0)

## 2019-03-03 LAB — WET PREP, GENITAL
Sperm: NONE SEEN
Trich, Wet Prep: NONE SEEN
WBC, Wet Prep HPF POC: NONE SEEN
Yeast Wet Prep HPF POC: NONE SEEN

## 2019-03-03 LAB — URINALYSIS, MICROSCOPIC (REFLEX): WBC, UA: >50 WBC/hpf (ref 0–5)

## 2019-03-03 LAB — HIV ANTIBODY (ROUTINE TESTING W REFLEX): HIV Screen 4th Generation wRfx: NONREACTIVE

## 2019-03-03 MED ORDER — AZITHROMYCIN 250 MG PO TABS
1000.0000 mg | ORAL_TABLET | Freq: Once | ORAL | Status: AC
  Administered 2019-03-03: 1000 mg via ORAL
  Filled 2019-03-03: qty 4

## 2019-03-03 MED ORDER — HYDROCODONE-ACETAMINOPHEN 5-325 MG PO TABS: 1.0000 | ORAL_TABLET | ORAL | 0 refills | Status: DC | PRN

## 2019-03-03 MED ORDER — LIDOCAINE HCL (PF) 1 % IJ SOLN
INTRAMUSCULAR | Status: AC
Start: 2019-03-03 — End: 2019-03-03
  Administered 2019-03-03: 18:00:00 0.9 mL
  Filled 2019-03-03: qty 5

## 2019-03-03 MED ORDER — NITROFURANTOIN MONOHYD MACRO 100 MG PO CAPS: 100.0000 mg | ORAL_CAPSULE | Freq: Two times a day (BID) | ORAL | 0 refills | Status: DC

## 2019-03-03 MED ORDER — METRONIDAZOLE 500 MG PO TABS: 500.0000 mg | ORAL_TABLET | Freq: Two times a day (BID) | ORAL | 0 refills | Status: AC

## 2019-03-03 MED ORDER — HYDROCODONE-ACETAMINOPHEN 5-325 MG PO TABS
1.0000 | ORAL_TABLET | Freq: Once | ORAL | Status: AC
  Administered 2019-03-03: 1 via ORAL
  Filled 2019-03-03: qty 1

## 2019-03-03 MED ORDER — METRONIDAZOLE 500 MG PO TABS
500.0000 mg | ORAL_TABLET | Freq: Once | ORAL | Status: AC
  Administered 2019-03-03: 17:00:00 500 mg via ORAL
  Filled 2019-03-03: qty 1

## 2019-03-03 MED ORDER — CEFTRIAXONE SODIUM 250 MG IJ SOLR
250.0000 mg | Freq: Once | INTRAMUSCULAR | Status: AC
  Administered 2019-03-03: 250 mg via INTRAMUSCULAR
  Filled 2019-03-03: qty 250
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
## 2019-03-03 NOTE — ED Notes (Signed)
Patient transported to CT 
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
## 2019-03-03 NOTE — ED Notes (Signed)
Pt verbalized understanding d/c instructions. 

## 2019-03-03 NOTE — ED Notes (Signed)
Pt and family given information for [facility]

## 2019-03-03 NOTE — Discharge Instructions (Signed)
Given a short course of pain medicine.  Do not drive or operate machinery if taking this medicine.  I am also giving you an antibiotic for UTI near atrial vaginosis.  You do not drink alcohol taking this medicine.  Follow-up up with [facility] for your neck pain.  Have also given you resources for outpatient follow-up.

## 2019-03-04 LAB — RPR: RPR Ser Ql: NONREACTIVE

## 2019-03-05 LAB — GC/CHLAMYDIA PROBE AMP (~~LOC~~) NOT AT ARMC
Chlamydia: NEGATIVE
Neisseria Gonorrhea: NEGATIVE
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
## 2019-03-14 ENCOUNTER — Inpatient Hospital Stay: Payer: Medicaid Other
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
## 2019-05-26 ENCOUNTER — Encounter (HOSPITAL_COMMUNITY): Payer: Self-pay | Admitting: Emergency Medicine

## 2019-05-26 ENCOUNTER — Inpatient Hospital Stay (HOSPITAL_COMMUNITY)
Admission: EM | Admit: 2019-05-26 | Discharge: 2019-05-30 | DRG: 800 | Disposition: A | Discharge status: Home Health | Payer: Self-pay | Attending: General Surgery | Admitting: General Surgery

## 2019-05-26 ENCOUNTER — Emergency Department (HOSPITAL_COMMUNITY): Payer: Self-pay | Admitting: Certified Registered"

## 2019-05-26 ENCOUNTER — Emergency Department (HOSPITAL_COMMUNITY): Payer: Self-pay

## 2019-05-26 ENCOUNTER — Encounter (HOSPITAL_COMMUNITY): Admission: EM | Disposition: A | Discharge status: Home / Self Care | Payer: Self-pay | Source: Home / Self Care

## 2019-05-26 ENCOUNTER — Other Ambulatory Visit: Payer: Self-pay

## 2019-05-26 DIAGNOSIS — Y92410 Unspecified street and highway as the place of occurrence of the external cause: Secondary | ICD-10-CM

## 2019-05-26 DIAGNOSIS — S36039A Unspecified laceration of spleen, initial encounter: Principal | ICD-10-CM

## 2019-05-26 DIAGNOSIS — M79602 Pain in left arm: Secondary | ICD-10-CM

## 2019-05-26 DIAGNOSIS — I9589 Other hypotension: Secondary | ICD-10-CM | POA: Diagnosis present

## 2019-05-26 DIAGNOSIS — D62 Acute posthemorrhagic anemia: Secondary | ICD-10-CM | POA: Diagnosis present

## 2019-05-26 DIAGNOSIS — E861 Hypovolemia: Secondary | ICD-10-CM

## 2019-05-26 DIAGNOSIS — R339 Retention of urine, unspecified: Secondary | ICD-10-CM | POA: Diagnosis not present

## 2019-05-26 DIAGNOSIS — Z91011 Allergy to milk products: Secondary | ICD-10-CM

## 2019-05-26 DIAGNOSIS — Z20822 Contact with and (suspected) exposure to covid-19: Secondary | ICD-10-CM | POA: Diagnosis present

## 2019-05-26 DIAGNOSIS — Z91012 Allergy to eggs: Secondary | ICD-10-CM

## 2019-05-26 DIAGNOSIS — S42032A Displaced fracture of lateral end of left clavicle, initial encounter for closed fracture: Secondary | ICD-10-CM | POA: Diagnosis present

## 2019-05-26 DIAGNOSIS — Z882 Allergy status to sulfonamides status: Secondary | ICD-10-CM

## 2019-05-26 DIAGNOSIS — S3609XA Other injury of spleen, initial encounter: Secondary | ICD-10-CM | POA: Diagnosis present

## 2019-05-26 DIAGNOSIS — R4182 Altered mental status, unspecified: Secondary | ICD-10-CM

## 2019-05-26 HISTORY — PX: SPLENECTOMY, TOTAL: SHX788

## 2019-05-26 LAB — POCT I-STAT 7, (LYTES, BLD GAS, ICA,H+H)
Acid-base deficit: 7 mmol/L — ABNORMAL HIGH (ref 0.0–2.0)
Bicarbonate: 20.1 mmol/L (ref 20.0–28.0)
Calcium, Ion: 1.06 mmol/L — ABNORMAL LOW (ref 1.15–1.40)
HCT: 19 % — ABNORMAL LOW (ref 36.0–46.0)
Hemoglobin: 6.5 g/dL — CL (ref 12.0–15.0)
O2 Saturation: 100 %
Potassium: 4.3 mmol/L (ref 3.5–5.1)
Sodium: 141 mmol/L (ref 135–145)
TCO2: 22 mmol/L (ref 22–32)
pCO2 arterial: 46.5 mmHg (ref 32.0–48.0)
pH, Arterial: 7.244 — ABNORMAL LOW (ref 7.350–7.450)
pO2, Arterial: 561 mmHg — ABNORMAL HIGH (ref 83.0–108.0)

## 2019-05-26 LAB — LACTIC ACID, PLASMA: Lactic Acid, Venous: 3.7 mmol/L (ref 0.5–1.9)

## 2019-05-26 LAB — I-STAT CHEM 8, ED
BUN: 19 mg/dL (ref 6–20)
Calcium, Ion: 1.13 mmol/L — ABNORMAL LOW (ref 1.15–1.40)
Chloride: 105 mmol/L (ref 98–111)
Creatinine, Ser: 0.8 mg/dL (ref 0.44–1.00)
Glucose, Bld: 149 mg/dL — ABNORMAL HIGH (ref 70–99)
HCT: 27 % — ABNORMAL LOW (ref 36.0–46.0)
Hemoglobin: 9.2 g/dL — ABNORMAL LOW (ref 12.0–15.0)
Potassium: 3.6 mmol/L (ref 3.5–5.1)
Sodium: 140 mmol/L (ref 135–145)
TCO2: 23 mmol/L (ref 22–32)

## 2019-05-26 LAB — COMPREHENSIVE METABOLIC PANEL
ALT: 92 U/L — ABNORMAL HIGH (ref 0–44)
AST: 62 U/L — ABNORMAL HIGH (ref 15–41)
Albumin: 3 g/dL — ABNORMAL LOW (ref 3.5–5.0)
Alkaline Phosphatase: 63 U/L (ref 38–126)
Anion gap: 10 (ref 5–15)
BUN: 19 mg/dL (ref 6–20)
CO2: 21 mmol/L — ABNORMAL LOW (ref 22–32)
Calcium: 8.2 mg/dL — ABNORMAL LOW (ref 8.9–10.3)
Chloride: 108 mmol/L (ref 98–111)
Creatinine, Ser: 0.94 mg/dL (ref 0.44–1.00)
GFR calc Af Amer: >60 mL/min (ref >60)
GFR calc non Af Amer: >60 mL/min (ref >60)
Glucose, Bld: 161 mg/dL — ABNORMAL HIGH (ref 70–99)
Potassium: 3.5 mmol/L (ref 3.5–5.1)
Sodium: 139 mmol/L (ref 135–145)
Total Bilirubin: 0.7 mg/dL (ref 0.3–1.2)
Total Protein: 5.2 g/dL — ABNORMAL LOW (ref 6.5–8.1)

## 2019-05-26 LAB — CDS SEROLOGY

## 2019-05-26 LAB — CBC
HCT: 29 % — ABNORMAL LOW (ref 36.0–46.0)
Hemoglobin: 9.4 g/dL — ABNORMAL LOW (ref 12.0–15.0)
MCH: 31.2 pg (ref 26.0–34.0)
MCHC: 32.4 g/dL (ref 30.0–36.0)
MCV: 96.3 fL (ref 80.0–100.0)
Platelets: 242 10*3/uL (ref 150–400)
RBC: 3.01 MIL/uL — ABNORMAL LOW (ref 3.87–5.11)
RDW: 12.5 % (ref 11.5–15.5)
WBC: 5.2 10*3/uL (ref 4.0–10.5)
nRBC: 0 % (ref 0.0–0.2)

## 2019-05-26 LAB — ETHANOL: Alcohol, Ethyl (B): <10 mg/dL (ref <10)

## 2019-05-26 LAB — RESPIRATORY PANEL BY RT PCR (FLU A&B, COVID)
Influenza A by PCR: NEGATIVE
Influenza B by PCR: NEGATIVE
SARS Coronavirus 2 by RT PCR: NEGATIVE

## 2019-05-26 LAB — SAMPLE TO BLOOD BANK

## 2019-05-26 LAB — ACETAMINOPHEN LEVEL: Acetaminophen (Tylenol), Serum: <10 ug/mL — ABNORMAL LOW (ref 10–30)

## 2019-05-26 LAB — SALICYLATE LEVEL: Salicylate Lvl: <7 mg/dL — ABNORMAL LOW (ref 7.0–30.0)

## 2019-05-26 LAB — ABO/RH: ABO/RH(D): A POS

## 2019-05-26 LAB — PREPARE RBC (CROSSMATCH)

## 2019-05-26 LAB — PROTIME-INR
INR: 1.2 (ref 0.8–1.2)
Prothrombin Time: 14.6 seconds (ref 11.4–15.2)

## 2019-05-26 SURGERY — SPLENECTOMY
Anesthesia: General | Site: Abdomen

## 2019-05-26 MED ORDER — ROCURONIUM BROMIDE 10 MG/ML (PF) SYRINGE
PREFILLED_SYRINGE | INTRAVENOUS | Status: DC | PRN
  Administered 2019-05-26: 60 mg via INTRAVENOUS

## 2019-05-26 MED ORDER — ONDANSETRON 4 MG PO TBDP: 4.0000 mg | ORAL_TABLET | Freq: Four times a day (QID) | ORAL | Status: DC | PRN

## 2019-05-26 MED ORDER — ONDANSETRON HCL 4 MG/2ML IJ SOLN: 4.0000 mg | Freq: Four times a day (QID) | INTRAMUSCULAR | Status: DC | PRN

## 2019-05-26 MED ORDER — LIDOCAINE 2% (20 MG/ML) 5 ML SYRINGE
INTRAMUSCULAR | Status: DC | PRN
  Administered 2019-05-26: 60 mg via INTRAVENOUS

## 2019-05-26 MED ORDER — MIDAZOLAM HCL 2 MG/2ML IJ SOLN
INTRAMUSCULAR | Status: AC
  Filled 2019-05-26: qty 2

## 2019-05-26 MED ORDER — PANTOPRAZOLE SODIUM 40 MG IV SOLR
40.0000 mg | Freq: Every day | INTRAVENOUS | Status: DC
  Administered 2019-05-26: 40 mg via INTRAVENOUS
  Filled 2019-05-26: qty 40

## 2019-05-26 MED ORDER — SUGAMMADEX SODIUM 200 MG/2ML IV SOLN
INTRAVENOUS | Status: DC | PRN
  Administered 2019-05-26: 300 mg via INTRAVENOUS

## 2019-05-26 MED ORDER — FENTANYL CITRATE (PF) 250 MCG/5ML IJ SOLN
INTRAMUSCULAR | Status: DC | PRN
  Administered 2019-05-26: 100 ug via INTRAVENOUS

## 2019-05-26 MED ORDER — PHENYLEPHRINE HCL (PRESSORS) 10 MG/ML IV SOLN
INTRAVENOUS | Status: DC | PRN
  Administered 2019-05-26 (×2): 120 ug via INTRAVENOUS
  Administered 2019-05-26: 80 ug via INTRAVENOUS

## 2019-05-26 MED ORDER — CEFAZOLIN SODIUM-DEXTROSE 2-4 GM/100ML-% IV SOLN
2.0000 g | Freq: Once | INTRAVENOUS | Status: AC
  Administered 2019-05-26: 2 g via INTRAVENOUS
  Filled 2019-05-26: qty 100

## 2019-05-26 MED ORDER — SODIUM CHLORIDE 0.9% IV SOLUTION: Freq: Once | INTRAVENOUS | Status: DC

## 2019-05-26 MED ORDER — FENTANYL CITRATE (PF) 100 MCG/2ML IJ SOLN
25.0000 ug | INTRAMUSCULAR | Status: DC | PRN
  Administered 2019-05-26 (×2): 25 ug via INTRAVENOUS
  Administered 2019-05-26: 50 ug via INTRAVENOUS

## 2019-05-26 MED ORDER — PANTOPRAZOLE SODIUM 40 MG PO TBEC
40.0000 mg | DELAYED_RELEASE_TABLET | Freq: Every day | ORAL | Status: DC
  Administered 2019-05-27 – 2019-05-30 (×4): 40 mg via ORAL
  Filled 2019-05-26 (×4): qty 1

## 2019-05-26 MED ORDER — PROPOFOL 10 MG/ML IV BOLUS
INTRAVENOUS | Status: DC | PRN
  Administered 2019-05-26: 60 mg via INTRAVENOUS

## 2019-05-26 MED ORDER — LACTATED RINGERS IV SOLN: INTRAVENOUS | Status: DC | PRN

## 2019-05-26 MED ORDER — MORPHINE SULFATE (PF) 2 MG/ML IV SOLN
2.0000 mg | INTRAVENOUS | Status: DC | PRN
  Administered 2019-05-26 – 2019-05-27 (×3): 2 mg via INTRAVENOUS
  Administered 2019-05-27: 4 mg via INTRAVENOUS
  Administered 2019-05-27: 2 mg via INTRAVENOUS
  Administered 2019-05-27: 4 mg via INTRAVENOUS
  Administered 2019-05-27 – 2019-05-28 (×2): 2 mg via INTRAVENOUS
  Administered 2019-05-28: 4 mg via INTRAVENOUS
  Administered 2019-05-29: 2 mg via INTRAVENOUS
  Filled 2019-05-26 (×6): qty 1
  Filled 2019-05-26: qty 2
  Filled 2019-05-26: qty 1
  Filled 2019-05-26 (×2): qty 2

## 2019-05-26 MED ORDER — ACETAMINOPHEN 325 MG PO TABS: 650.0000 mg | ORAL_TABLET | ORAL | Status: DC | PRN

## 2019-05-26 MED ORDER — PHENYLEPHRINE HCL-NACL 10-0.9 MG/250ML-% IV SOLN
INTRAVENOUS | Status: DC | PRN
  Administered 2019-05-26: 50 ug/min via INTRAVENOUS

## 2019-05-26 MED ORDER — DOCUSATE SODIUM 100 MG PO CAPS
100.0000 mg | ORAL_CAPSULE | Freq: Two times a day (BID) | ORAL | Status: DC
  Administered 2019-05-26 – 2019-05-30 (×8): 100 mg via ORAL
  Filled 2019-05-26 (×8): qty 1

## 2019-05-26 MED ORDER — MIDAZOLAM HCL 2 MG/2ML IJ SOLN
INTRAMUSCULAR | Status: DC | PRN
  Administered 2019-05-26: 2 mg via INTRAVENOUS

## 2019-05-26 MED ORDER — SODIUM CHLORIDE 0.9 % IV SOLN
INTRAVENOUS | Status: AC | PRN
  Administered 2019-05-26 (×2): 1000 mL via INTRAVENOUS

## 2019-05-26 MED ORDER — METOPROLOL TARTRATE 5 MG/5ML IV SOLN: 5.0000 mg | Freq: Four times a day (QID) | INTRAVENOUS | Status: DC | PRN

## 2019-05-26 MED ORDER — SUCCINYLCHOLINE CHLORIDE 200 MG/10ML IV SOSY
PREFILLED_SYRINGE | INTRAVENOUS | Status: DC | PRN
  Administered 2019-05-26: 140 mg via INTRAVENOUS

## 2019-05-26 MED ORDER — 0.9 % SODIUM CHLORIDE (POUR BTL) OPTIME
TOPICAL | Status: DC | PRN
  Administered 2019-05-26 (×2): 2000 mL

## 2019-05-26 MED ORDER — ONDANSETRON HCL 4 MG/2ML IJ SOLN
INTRAMUSCULAR | Status: DC | PRN
  Administered 2019-05-26: 4 mg via INTRAVENOUS

## 2019-05-26 MED ORDER — OXYCODONE HCL 5 MG PO TABS
5.0000 mg | ORAL_TABLET | ORAL | Status: DC | PRN
  Administered 2019-05-26 – 2019-05-27 (×3): 5 mg via ORAL
  Filled 2019-05-26 (×3): qty 1

## 2019-05-26 MED ORDER — IOHEXOL 300 MG/ML  SOLN
100.0000 mL | Freq: Once | INTRAMUSCULAR | Status: AC | PRN
  Administered 2019-05-26: 100 mL via INTRAVENOUS

## 2019-05-26 MED ORDER — SODIUM CHLORIDE 0.9 % IV SOLN: INTRAVENOUS | Status: DC

## 2019-05-26 MED ORDER — ENOXAPARIN SODIUM 40 MG/0.4ML ~~LOC~~ SOLN
40.0000 mg | SUBCUTANEOUS | Status: DC
  Administered 2019-05-27 – 2019-05-30 (×4): 40 mg via SUBCUTANEOUS
  Filled 2019-05-26 (×4): qty 0.4

## 2019-05-26 MED ORDER — DEXAMETHASONE SODIUM PHOSPHATE 10 MG/ML IJ SOLN
INTRAMUSCULAR | Status: DC | PRN
  Administered 2019-05-26: 10 mg via INTRAVENOUS

## 2019-05-26 MED ORDER — FENTANYL CITRATE (PF) 100 MCG/2ML IJ SOLN
INTRAMUSCULAR | Status: AC
  Filled 2019-05-26: qty 2

## 2019-05-26 MED ORDER — TETANUS-DIPHTH-ACELL PERTUSSIS 5-2.5-18.5 LF-MCG/0.5 IM SUSP
INTRAMUSCULAR | Status: AC
  Administered 2019-05-26: 1 mL via INTRAMUSCULAR
  Filled 2019-05-26: qty 0.5

## 2019-05-26 MED ORDER — FENTANYL CITRATE (PF) 250 MCG/5ML IJ SOLN
INTRAMUSCULAR | Status: AC
  Filled 2019-05-26: qty 5

## 2019-05-26 MED ORDER — ALBUMIN HUMAN 5 % IV SOLN: INTRAVENOUS | Status: DC | PRN

## 2019-05-26 SURGICAL SUPPLY — 49 items
APL PRP STRL LF DISP 70% ISPRP (MISCELLANEOUS) ×2
CHLORAPREP W/TINT 26 (MISCELLANEOUS) ×4 IMPLANT
CLIP VESOCCLUDE LG 6/CT (CLIP) ×4 IMPLANT
CLIP VESOCCLUDE MED 6/CT (CLIP) ×4 IMPLANT
CLIP VESOCCLUDE SM WIDE 6/CT (CLIP) ×4 IMPLANT
COVER SURGICAL LIGHT HANDLE (MISCELLANEOUS) ×4 IMPLANT
COVER WAND RF STERILE (DRAPES) ×4 IMPLANT
DRAIN CHANNEL 19F RND (DRAIN) ×3 IMPLANT
DRAPE LAPAROSCOPIC ABDOMINAL (DRAPES) ×4 IMPLANT
DRAPE WARM FLUID 44X44 (DRAPES) ×4 IMPLANT
DRSG COVADERM 4X10 (GAUZE/BANDAGES/DRESSINGS) ×3 IMPLANT
DRSG OPSITE POSTOP 4X10 (GAUZE/BANDAGES/DRESSINGS) IMPLANT
DRSG OPSITE POSTOP 4X8 (GAUZE/BANDAGES/DRESSINGS) IMPLANT
ELECT BLADE 6.5 EXT (BLADE) IMPLANT
ELECT CAUTERY BLADE 6.4 (BLADE) ×4 IMPLANT
ELECT REM PT RETURN 9FT ADLT (ELECTROSURGICAL) ×4
ELECTRODE REM PT RTRN 9FT ADLT (ELECTROSURGICAL) ×2 IMPLANT
EVACUATOR SILICONE 100CC (DRAIN) ×3 IMPLANT
GLOVE BIOGEL PI IND STRL 7.0 (GLOVE) ×3 IMPLANT
GLOVE BIOGEL PI IND STRL 7.5 (GLOVE) ×1 IMPLANT
GLOVE BIOGEL PI INDICATOR 7.0 (GLOVE) ×4
GLOVE BIOGEL PI INDICATOR 7.5 (GLOVE) ×2
GLOVE SURG SS PI 7.0 STRL IVOR (GLOVE) ×4 IMPLANT
GOWN STRL REUS W/ TWL LRG LVL3 (GOWN DISPOSABLE) ×7 IMPLANT
GOWN STRL REUS W/TWL LRG LVL3 (GOWN DISPOSABLE) ×20
HANDLE SUCTION POOLE (INSTRUMENTS) ×2 IMPLANT
KIT BASIN OR (CUSTOM PROCEDURE TRAY) ×4 IMPLANT
LIGASURE IMPACT 36 18CM CVD LR (INSTRUMENTS) IMPLANT
LOOP VESSEL MAXI BLUE (MISCELLANEOUS) IMPLANT
LOOP VESSEL MINI RED (MISCELLANEOUS) IMPLANT
NEEDLE 22X1 1/2 (OR ONLY) (NEEDLE) ×4 IMPLANT
NS IRRIG 1000ML POUR BTL (IV SOLUTION) ×15 IMPLANT
PACK GENERAL/GYN (CUSTOM PROCEDURE TRAY) ×4 IMPLANT
PENCIL SMOKE EVACUATOR (MISCELLANEOUS) ×4 IMPLANT
SPONGE LAP 18X18 RF (DISPOSABLE) IMPLANT
STAPLER VISISTAT 35W (STAPLE) ×4 IMPLANT
SUCTION POOLE HANDLE (INSTRUMENTS) ×4
SUT ETHILON 2 0 FS 18 (SUTURE) ×3 IMPLANT
SUT PDS AB 0 CT 36 (SUTURE) ×6 IMPLANT
SUT PDS AB 1 TP1 96 (SUTURE) IMPLANT
SUT SILK 2 0 (SUTURE) ×4
SUT SILK 2 0 SH CR/8 (SUTURE) ×4 IMPLANT
SUT SILK 2-0 18XBRD TIE 12 (SUTURE) ×2 IMPLANT
SUT SILK 3 0 (SUTURE) ×4
SUT SILK 3 0 SH CR/8 (SUTURE) ×4 IMPLANT
SUT SILK 3-0 18XBRD TIE 12 (SUTURE) ×2 IMPLANT
TOWEL GREEN STERILE (TOWEL DISPOSABLE) ×4 IMPLANT
TRAY FOLEY MTR SLVR 16FR STAT (SET/KITS/TRAYS/PACK) ×4 IMPLANT
YANKAUER SUCT BULB TIP NO VENT (SUCTIONS) IMPLANT
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
## 2019-05-26 NOTE — OR Nursing (Signed)
Sent Patient belongings in a patient bag with patient to PACU.
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
## 2019-05-26 NOTE — ED Notes (Signed)
No blood admin at this time per MD
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
## 2019-05-26 NOTE — Transfer of Care (Signed)
Immediate Anesthesia Transfer of Care Note  Patient: [name]  Procedure(s) Performed: Splenectomy (Abdomen)  Patient Location: PACU  Anesthesia Type:General  Level of Consciousness: awake  Airway & Oxygen Therapy: Patient Spontanous Breathing and Patient connected to nasal cannula oxygen  Post-op Assessment: Report given to RN and Post -op Vital signs reviewed and stable  Post vital signs: Reviewed and stable  Last Vitals:  Vitals Value Taken Time  BP    Temp    Pulse    Resp    SpO2      Last Pain:  Vitals:   05/26/19 1535  TempSrc:   PainSc: (P) Asleep         Complications: No apparent anesthesia complications
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
## 2019-05-26 NOTE — Progress Notes (Signed)
Pt pulled foley out.  Dr. Sheliah Hatch notified and he advised not to replace.  Pt also pulling at JP drain.  Green safety mitts applied.
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
## 2019-05-26 NOTE — Anesthesia Preprocedure Evaluation (Signed)
Anesthesia Evaluation    Airway Mallampati: I  TM Distance: >3 FB     Dental   Pulmonary    breath sounds clear to auscultation       Cardiovascular  Rhythm:Regular     Neuro/Psych    GI/Hepatic   Endo/Other    Renal/GU      Musculoskeletal   Abdominal   Peds  Hematology   Anesthesia Other Findings   Reproductive/Obstetrics                             Anesthesia Physical Anesthesia Plan  ASA: III  Anesthesia Plan: General   Post-op Pain Management:    Induction: Intravenous  PONV Risk Score and Plan: 3 and Ondansetron and Midazolam  Airway Management Planned: Oral ETT  Additional Equipment:   Intra-op Plan:   Post-operative Plan: Extubation in OR  Informed Consent: I have reviewed the patients History and Physical, chart, labs and discussed the procedure including the risks, benefits and alternatives for the proposed anesthesia with the patient or authorized representative who has indicated his/her understanding and acceptance.       Plan Discussed with: CRNA and Anesthesiologist  Anesthesia Plan Comments:         Anesthesia Quick Evaluation

## 2019-05-26 NOTE — Anesthesia Procedure Notes (Signed)
Arterial Line Insertion Start/End2/28/2021 2:25 PM, 05/26/2019 2:30 PM Performed by: Dairl Ponder, CRNA, CRNA  Patient location: OR. Preanesthetic checklist: patient identified, IV checked and monitors and equipment checked Left, radial was placed Catheter size: 20 G Maximum sterile barriers used   Attempts: 2 Procedure performed without using ultrasound guided technique. Ultrasound Notes:anatomy identified Following insertion, dressing applied and Biopatch.

## 2019-05-26 NOTE — Anesthesia Procedure Notes (Signed)
Procedure Name: Intubation Date/Time: 05/26/2019 2:15 PM Performed by: Clearnce Sorrel, CRNA Pre-anesthesia Checklist: Patient identified, Emergency Drugs available, Suction available and Patient being monitored Patient Re-evaluated:Patient Re-evaluated prior to induction Oxygen Delivery Method: Circle System Utilized Preoxygenation: Pre-oxygenation with 100% oxygen Induction Type: IV induction Ventilation: Mask ventilation without difficulty Laryngoscope Size: Mac and 3 Grade View: Grade I Tube type: Oral Tube size: 7.0 mm Number of attempts: 1 Airway Equipment and Method: Stylet and Oral airway Placement Confirmation: ETT inserted through vocal cords under direct vision,  positive ETCO2 and breath sounds checked- equal and bilateral Secured at: 21 cm Tube secured with: Tape Dental Injury: Teeth and Oropharynx as per pre-operative assessment

## 2019-05-26 NOTE — Anesthesia Postprocedure Evaluation (Signed)
Anesthesia Post Note  Patient: [name]  Procedure(s) Performed: Splenectomy (Abdomen)     Patient location during evaluation: PACU Anesthesia Type: General Level of consciousness: awake Pain management: pain level controlled Vital Signs Assessment: post-procedure vital signs reviewed and stable Respiratory status: spontaneous breathing Cardiovascular status: stable Postop Assessment: no apparent nausea or vomiting Anesthetic complications: no    Last Vitals:  Vitals:   05/26/19 1635 05/26/19 1654  BP: (!) 115/91 113/90  Pulse: 83 80  Resp: (!) 23 (!) 27  Temp: 36.7 C 36.9 C  SpO2: 100% 99%    Last Pain:  Vitals:   05/26/19 1654  TempSrc: Oral  PainSc:                  Delrick Dehart

## 2019-05-27 ENCOUNTER — Inpatient Hospital Stay (HOSPITAL_COMMUNITY): Payer: Self-pay

## 2019-05-27 LAB — CBC
HCT: 28.8 % — ABNORMAL LOW (ref 36.0–46.0)
Hemoglobin: 9.5 g/dL — ABNORMAL LOW (ref 12.0–15.0)
MCH: 30.3 pg (ref 26.0–34.0)
MCHC: 33 g/dL (ref 30.0–36.0)
MCV: 91.7 fL (ref 80.0–100.0)
Platelets: 142 10*3/uL — ABNORMAL LOW (ref 150–400)
RBC: 3.14 MIL/uL — ABNORMAL LOW (ref 3.87–5.11)
RDW: 15.4 % (ref 11.5–15.5)
WBC: 12.6 10*3/uL — ABNORMAL HIGH (ref 4.0–10.5)
nRBC: 0 % (ref 0.0–0.2)

## 2019-05-27 LAB — URINALYSIS, ROUTINE W REFLEX MICROSCOPIC
Bacteria, UA: NONE SEEN
Bilirubin Urine: NEGATIVE
Glucose, UA: NEGATIVE mg/dL
Ketones, ur: NEGATIVE mg/dL
Leukocytes,Ua: NEGATIVE
Nitrite: NEGATIVE
Protein, ur: NEGATIVE mg/dL
Specific Gravity, Urine: 1.036 — ABNORMAL HIGH (ref 1.005–1.030)
pH: 5 (ref 5.0–8.0)

## 2019-05-27 LAB — BASIC METABOLIC PANEL WITH GFR
Anion gap: 8 (ref 5–15)
BUN: 12 mg/dL (ref 6–20)
CO2: 20 mmol/L — ABNORMAL LOW (ref 22–32)
Calcium: 7.5 mg/dL — ABNORMAL LOW (ref 8.9–10.3)
Chloride: 111 mmol/L (ref 98–111)
Creatinine, Ser: 0.56 mg/dL (ref 0.44–1.00)
GFR calc Af Amer: >60 mL/min
GFR calc non Af Amer: >60 mL/min
Glucose, Bld: 124 mg/dL — ABNORMAL HIGH (ref 70–99)
Potassium: 4.1 mmol/L (ref 3.5–5.1)
Sodium: 139 mmol/L (ref 135–145)

## 2019-05-27 LAB — HIV ANTIBODY (ROUTINE TESTING W REFLEX): HIV Screen 4th Generation wRfx: NONREACTIVE

## 2019-05-27 MED ORDER — OXYCODONE HCL 5 MG PO TABS
5.0000 mg | ORAL_TABLET | ORAL | Status: DC | PRN
  Administered 2019-05-28 – 2019-05-30 (×13): 10 mg via ORAL
  Filled 2019-05-27 (×13): qty 2

## 2019-05-27 MED ORDER — METHOCARBAMOL 750 MG PO TABS
750.0000 mg | ORAL_TABLET | Freq: Three times a day (TID) | ORAL | Status: DC
  Administered 2019-05-27 – 2019-05-30 (×11): 750 mg via ORAL
  Filled 2019-05-27 (×11): qty 1

## 2019-05-27 MED ORDER — CHLORHEXIDINE GLUCONATE CLOTH 2 % EX PADS
6.0000 | MEDICATED_PAD | Freq: Every day | CUTANEOUS | Status: DC
  Administered 2019-05-27 – 2019-05-30 (×4): 6 via TOPICAL

## 2019-05-27 MED ORDER — ACETAMINOPHEN 500 MG PO TABS
1000.0000 mg | ORAL_TABLET | Freq: Four times a day (QID) | ORAL | Status: DC
  Administered 2019-05-27 – 2019-05-30 (×12): 1000 mg via ORAL
  Filled 2019-05-27 (×12): qty 2
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
## 2019-05-27 NOTE — Progress Notes (Signed)
Orthopedic Tech Progress Note Patient Details:  [name] 12/29/1975 [id number]  Ortho Devices Type of Ortho Device: Sling immobilizer Ortho Device/Splint Location: left       Saul Fordyce 05/27/2019, 2:42 PM

## 2019-05-27 NOTE — Progress Notes (Signed)
OT Cancellation Note  Patient Details Name: [name] MRN: [id number] DOB: [birth year]   Cancelled Treatment:    Reason Eval/Treat Not Completed: Pain limiting ability to participate. Pt stating she is in too much pain for OOB activity. Will return as schedule allows. Thank you.  Jaryn Hocutt M Krimson Massmann Kaiyden Simkin MSOT, OTR/L Acute Rehab Pager: [phone] Office: [phone] 05/27/2019, 2:32 PM
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
## 2019-05-27 NOTE — Progress Notes (Signed)
PT Cancellation Note  Patient Details Name: [name] MRN: [id number] DOB: 1975-11-17   Cancelled Treatment:    Reason Eval/Treat Not Completed: Patient declined, too much pain 05/27/2019  Jacinto Halim., PT Acute Rehabilitation Services [phone]  (pager) [phone]  (office).   Eliseo Gum Alonzo Owczarzak 05/27/2019, 2:36 PM

## 2019-05-28 LAB — CBC
HCT: 23.9 % — ABNORMAL LOW (ref 36.0–46.0)
HCT: 24.2 % — ABNORMAL LOW (ref 36.0–46.0)
Hemoglobin: 8.1 g/dL — ABNORMAL LOW (ref 12.0–15.0)
Hemoglobin: 8.1 g/dL — ABNORMAL LOW (ref 12.0–15.0)
MCH: 30.7 pg (ref 26.0–34.0)
MCH: 30.5 pg (ref 26.0–34.0)
MCHC: 33.9 g/dL (ref 30.0–36.0)
MCHC: 33.5 g/dL (ref 30.0–36.0)
MCV: 90.5 fL (ref 80.0–100.0)
MCV: 91 fL (ref 80.0–100.0)
Platelets: 179 10*3/uL (ref 150–400)
Platelets: 202 10*3/uL (ref 150–400)
RBC: 2.64 MIL/uL — ABNORMAL LOW (ref 3.87–5.11)
RBC: 2.66 MIL/uL — ABNORMAL LOW (ref 3.87–5.11)
RDW: 15.5 % (ref 11.5–15.5)
RDW: 15.3 % (ref 11.5–15.5)
WBC: 10.5 10*3/uL (ref 4.0–10.5)
WBC: 11.8 10*3/uL — ABNORMAL HIGH (ref 4.0–10.5)
nRBC: 0 % (ref 0.0–0.2)
nRBC: 0 % (ref 0.0–0.2)

## 2019-05-28 LAB — BASIC METABOLIC PANEL
Anion gap: 6 (ref 5–15)
BUN: 5 mg/dL — ABNORMAL LOW (ref 6–20)
CO2: 22 mmol/L (ref 22–32)
Calcium: 7.7 mg/dL — ABNORMAL LOW (ref 8.9–10.3)
Chloride: 110 mmol/L (ref 98–111)
Creatinine, Ser: 0.46 mg/dL (ref 0.44–1.00)
GFR calc Af Amer: >60 mL/min (ref >60)
GFR calc non Af Amer: >60 mL/min (ref >60)
Glucose, Bld: 103 mg/dL — ABNORMAL HIGH (ref 70–99)
Potassium: 3.5 mmol/L (ref 3.5–5.1)
Sodium: 138 mmol/L (ref 135–145)

## 2019-05-28 LAB — SURGICAL PATHOLOGY

## 2019-05-28 MED ORDER — BETHANECHOL CHLORIDE 25 MG PO TABS
25.0000 mg | ORAL_TABLET | Freq: Three times a day (TID) | ORAL | Status: DC
  Administered 2019-05-28 – 2019-05-30 (×7): 25 mg via ORAL
  Filled 2019-05-28 (×7): qty 1
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
## 2019-05-28 NOTE — Progress Notes (Signed)
PT Cancellation Note  Patient Details Name: [name] MRN: [id number] DOB: 12/13/1975   Cancelled Treatment:    Reason Eval/Treat Not Completed: Patient declined in the am and pm due to "pain to much".  Will attempt to see pt for evaluation as able 3/3. 05/28/2019  Jacinto Halim., PT Acute Rehabilitation Services [phone]  (pager) [phone]  (office)    Eliseo Gum Shervon Kerwin 05/28/2019, 6:31 PM
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
## 2019-05-28 NOTE — Progress Notes (Signed)
VO was received from [place] [place], [place] to insert foley cath on 05/27/2019 due to urinary retention. I failed to completely enter order yesterday.  Order has now been entered.

## 2019-05-29 LAB — CBC
HCT: 23 % — ABNORMAL LOW (ref 36.0–46.0)
Hemoglobin: 7.7 g/dL — ABNORMAL LOW (ref 12.0–15.0)
MCH: 30.9 pg (ref 26.0–34.0)
MCHC: 33.5 g/dL (ref 30.0–36.0)
MCV: 92.4 fL (ref 80.0–100.0)
Platelets: 240 10*3/uL (ref 150–400)
RBC: 2.49 MIL/uL — ABNORMAL LOW (ref 3.87–5.11)
RDW: 14.8 % (ref 11.5–15.5)
WBC: 9.5 10*3/uL (ref 4.0–10.5)
nRBC: 0.2 % (ref 0.0–0.2)

## 2019-05-29 LAB — BASIC METABOLIC PANEL
Anion gap: 7 (ref 5–15)
BUN: 5 mg/dL — ABNORMAL LOW (ref 6–20)
CO2: 23 mmol/L (ref 22–32)
Calcium: 8 mg/dL — ABNORMAL LOW (ref 8.9–10.3)
Chloride: 107 mmol/L (ref 98–111)
Creatinine, Ser: 0.45 mg/dL (ref 0.44–1.00)
GFR calc Af Amer: >60 mL/min (ref >60)
GFR calc non Af Amer: >60 mL/min (ref >60)
Glucose, Bld: 100 mg/dL — ABNORMAL HIGH (ref 70–99)
Potassium: 3.3 mmol/L — ABNORMAL LOW (ref 3.5–5.1)
Sodium: 137 mmol/L (ref 135–145)

## 2019-05-29 MED ORDER — GABAPENTIN 300 MG PO CAPS
300.0000 mg | ORAL_CAPSULE | Freq: Three times a day (TID) | ORAL | Status: DC
  Administered 2019-05-29 – 2019-05-30 (×5): 300 mg via ORAL
  Filled 2019-05-29 (×5): qty 1
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
## 2019-05-30 LAB — TYPE AND SCREEN
ABO/RH(D): A POS
Antibody Screen: NEGATIVE
Unit division: 0
Unit division: 0
Unit division: 0
Unit division: 0
Unit division: 0
Unit division: 0
Unit division: 0
Unit division: 0

## 2019-05-30 LAB — CBC
HCT: 25 % — ABNORMAL LOW (ref 36.0–46.0)
Hemoglobin: 8.4 g/dL — ABNORMAL LOW (ref 12.0–15.0)
MCH: 30.7 pg (ref 26.0–34.0)
MCHC: 33.6 g/dL (ref 30.0–36.0)
MCV: 91.2 fL (ref 80.0–100.0)
Platelets: 344 10*3/uL (ref 150–400)
RBC: 2.74 MIL/uL — ABNORMAL LOW (ref 3.87–5.11)
RDW: 14.9 % (ref 11.5–15.5)
WBC: 7.7 10*3/uL (ref 4.0–10.5)
nRBC: 0.8 % — ABNORMAL HIGH (ref 0.0–0.2)

## 2019-05-30 LAB — BPAM RBC
Blood Product Expiration Date: 202103052359
Blood Product Expiration Date: 202103052359
Blood Product Expiration Date: 202103262359
Blood Product Expiration Date: 202103262359
Blood Product Expiration Date: 202103262359
Blood Product Expiration Date: 202103262359
Blood Product Expiration Date: 202103262359
Blood Product Expiration Date: 202103262359
ISSUE DATE / TIME: 202102281343
ISSUE DATE / TIME: 202102281349
ISSUE DATE / TIME: 202102281439
ISSUE DATE / TIME: 202102281439
ISSUE DATE / TIME: 202102281449
ISSUE DATE / TIME: 202102281449
Unit Type and Rh: 6200
Unit Type and Rh: 6200
Unit Type and Rh: 6200
Unit Type and Rh: 6200
Unit Type and Rh: 6200
Unit Type and Rh: 6200
Unit Type and Rh: 9500
Unit Type and Rh: 9500

## 2019-05-30 MED ORDER — HAEMOPHILUS B POLYSAC CONJ VAC IM SOLR
0.5000 mL | Freq: Once | INTRAMUSCULAR | Status: AC
  Administered 2019-05-30: 0.5 mL via INTRAMUSCULAR
  Filled 2019-05-30: qty 0.5

## 2019-05-30 MED ORDER — PNEUMOCOCCAL 13-VAL CONJ VACC IM SUSP
0.5000 mL | Freq: Once | INTRAMUSCULAR | Status: AC
  Administered 2019-05-30: 0.5 mL via INTRAMUSCULAR
  Filled 2019-05-30: qty 0.5

## 2019-05-30 MED ORDER — OXYCODONE HCL 5 MG PO TABS: 5.0000 mg | ORAL_TABLET | Freq: Four times a day (QID) | ORAL | 0 refills | Status: DC | PRN

## 2019-05-30 MED ORDER — APAP 325 MG PO TABS: 650.0000 mg | ORAL_TABLET | Freq: Four times a day (QID) | ORAL | Status: DC | PRN

## 2019-05-30 MED ORDER — DOCUSATE SODIUM 100 MG PO CAPS: 100.0000 mg | ORAL_CAPSULE | Freq: Two times a day (BID) | ORAL | 0 refills | Status: DC | PRN

## 2019-05-30 MED ORDER — CLONAZEPAM 0.5 MG PO TABS
0.5000 mg | ORAL_TABLET | Freq: Once | ORAL | Status: AC
  Administered 2019-05-30: 0.5 mg via ORAL
  Filled 2019-05-30: qty 1

## 2019-05-30 MED ORDER — MENINGOCOCCAL A C Y&W-135 OLIG IM SOLR
0.5000 mL | Freq: Once | INTRAMUSCULAR | Status: AC
  Administered 2019-05-30: 0.5 mL via INTRAMUSCULAR
  Filled 2019-05-30: qty 0.5

## 2019-05-30 MED ORDER — GABAPENTIN 300 MG PO CAPS: 300.0000 mg | ORAL_CAPSULE | Freq: Three times a day (TID) | ORAL | 0 refills | Status: DC | PRN

## 2019-05-30 MED ORDER — METHOCARBAMOL 750 MG PO TABS: 750.0000 mg | ORAL_TABLET | Freq: Three times a day (TID) | ORAL | 0 refills | Status: DC | PRN

## 2019-05-30 MED FILL — GABAPENTIN 300 MG CAPSULE: 300 | 20 days supply | Qty: 60 | Fill #0

## 2019-05-30 MED FILL — METHOCARBAMOL 750 MG TABS: 750 | 13 days supply | Qty: 40 | Fill #0

## 2019-05-30 MED FILL — oxyCODONE HCL 5 MG TABS: 5 | 3 days supply | Qty: 15 | Fill #0
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
## 2019-05-30 NOTE — Progress Notes (Signed)
Pt and family given D/C education and all questions answered. Prescriptions delivered to room by [facility] and equipment delivered prior to D/C. IV's removed. Pt taken to car with all belongings.

## 2019-05-30 NOTE — Discharge Instructions (Signed)
[facility]      [facility] [phone]  OPEN ABDOMINAL SURGERY: POST OP INSTRUCTIONS  Always review your discharge instruction sheet given to you by the facility where your surgery was performed.  IF YOU HAVE DISABILITY OR FAMILY LEAVE FORMS, YOU MUST BRING THEM TO THE OFFICE FOR PROCESSING.  PLEASE DO NOT GIVE THEM TO YOUR DOCTOR.  1. A prescription for pain medication may be given to you upon discharge.  Take your pain medication as prescribed, if needed.  If narcotic pain medicine is not needed, then you may take acetaminophen (Tylenol) or ibuprofen (Advil) as needed. 2. Take your usually prescribed medications unless otherwise directed. 3. If you need a refill on your pain medication, please contact your pharmacy. They will contact our office to request authorization.  Prescriptions will not be filled after 5pm or on week-ends. 4. You should follow a light diet the first few days after arrival home, such as soup and crackers, pudding, etc.unless your doctor has advised otherwise. A high-fiber, low fat diet can be resumed as tolerated.   Be sure to include lots of fluids daily. Most patients will experience some swelling and bruising on the chest and neck area.  Ice packs will help.  Swelling and bruising can take several days to resolve 5. Most patients will experience some swelling and bruising in the area of the incision. Ice pack will help. Swelling and bruising can take several days to resolve..  6. It is common to experience some constipation if taking pain medication after surgery.  Increasing fluid intake and taking a stool softener will usually help or prevent this problem from occurring.  A mild laxative (Milk of Magnesia or Miralax) should be taken according to package directions if there are no bowel movements after 48 hours. 7.  You may have steri-strips (small skin tapes) in place directly over the incision.  These strips should be left on the skin for 7-10 days.  If your  surgeon used skin glue on the incision, you may shower in 24 hours.  The glue will flake off over the next 2-3 weeks.  Any sutures or staples will be removed at the office during your follow-up visit. You may find that a light gauze bandage over your incision may keep your staples from being rubbed or pulled. You may shower and replace the bandage daily. 8. ACTIVITIES:  You may resume regular (light) daily activities beginning the next day--such as daily self-care, walking, climbing stairs--gradually increasing activities as tolerated.  You may have sexual intercourse when it is comfortable.  Refrain from any heavy lifting or straining until approved by your doctor. a. You may drive when you no longer are taking prescription pain medication, you can comfortably wear a seatbelt, and you can safely maneuver your car and apply brakes b. Return to Work: ___________________________________ 9. You should see your doctor in the office for a follow-up appointment approximately two weeks after your surgery.  Make sure that you call for this appointment within a day or two after you arrive home to insure a convenient appointment time. OTHER INSTRUCTIONS:  _____________________________________________________________ _____________________________________________________________  WHEN TO CALL YOUR DOCTOR: 1. Fever over 101.0 2. Inability to urinate 3. Nausea and/or vomiting 4. Extreme swelling or bruising 5. Continued bleeding from incision. 6. Increased pain, redness, or drainage from the incision. 7. Difficulty swallowing or breathing 8. Muscle cramping or spasms. 9. Numbness or tingling in hands or feet or around lips.  The clinic staff is available to   answer your questions during regular business hours.  Please don't hesitate to call and ask to speak to one of the nurses if you have concerns.  For further questions, please visit [email or web address]   Open Splenectomy  An open  splenectomy is a surgery to remove the spleen. The spleen is an organ that is located in the upper left part of the abdomen, just under the ribs. The spleen filters and cleans the blood. It also stores blood cells and destroys cells that are old. The spleen is also important for fighting disease. The spleen may need to be taken out if it is damaged from an injury or certain diseases or conditions, such as:  A ruptured spleen due to an injury or enlarged spleen.  A blood disease, such as idiopathic thrombocytopenic purpura (ITP).  A tumor.  Cancer.  An enlarged spleen.  A blood clot (thrombosis) in the blood vessels of the spleen.  Scarring of the liver (cirrhosis).  A collection of fluid or pus (cyst or abscess) in the spleen. In some cases, spleen removal is done as an emergency procedure. Tell a health care provider about:  Any allergies you have.  All medicines you are taking, including vitamins, herbs, eye drops, creams, and over-the-counter medicines.  Any problems you or family members have had with anesthetic medicines.  Any blood disorders you have.  Any surgeries you have had.  Any medical conditions you have.  Whether you are pregnant or may be pregnant. What are the risks? Generally, this is a safe procedure. However, problems may occur, including:  Infection.  Bleeding.  Allergic reactions to medicines.  Damage to other structures or organs.  A blood clot in the legs, arms, or other big veins. In rare cases, blood clots may travel to the lungs.  A hernia. This may occur if the incision does not heal correctly, causing a bulge near the incision. What happens before the procedure? Medicines Ask your health care provider about:  Changing or stopping your regular medicines. This is especially important if you are taking diabetes medicines or blood thinners.  Taking medicines such as aspirin and ibuprofen. These medicines can thin your blood. Do not take  these medicines unless your health care provider tells you to take them.  Taking over-the-counter medicines, vitamins, herbs, and supplements. Eating and drinking Follow instructions from your health care provider about eating and drinking, which may include:  8 hours before the procedure - stop eating heavy meals or foods, such as meat, fried foods, or fatty foods.  6 hours before the procedure - stop eating light meals or foods, such as toast or cereal.  6 hours before the procedure - stop drinking milk or drinks that contain milk.  2 hours before the procedure - stop drinking clear liquids. Staying hydrated Follow instructions from your health care provider about hydration, which may include:  Up to 2 hours before the procedure - you may continue to drink clear liquids, such as water, clear fruit juice, black coffee, and plain tea.  General instructions  You may have an exam or testing.  In some cases, you may receive donated blood or platelets through an IV (transfusions).  You may be given vaccinations to help prevent infections after the procedure. These are needed because not having a spleen may affect your body's ability to fight infections and can make certain infections more dangerous.  Ask your health care provider: ? How your surgery site will be marked. ? What steps will be  taken to help prevent infection. These may include:  Removing hair at the surgery site.  Washing skin with a germ-killing soap.  Receiving antibiotic medicine.  Do not use any products that contain nicotine or tobacco for at least 4 weeks before the procedure. These products include cigarettes, e-cigarettes, and chewing tobacco. If you need help quitting, ask your health care provider. What happens during the procedure?  An IV will be inserted into one of your veins.  You will be given one or more of the following: ? A medicine to help you relax (sedative). ? A medicine to make you fall  asleep (general anesthetic).  The surgeon will make an incision in the middle of your abdomen or under your rib cage.  The surgeon will remove your spleen and any extra spleen tissue that is found.  The surgeon may leave a drain in place. This is a small tube that comes out of the skin, near the incision.  The incision will be closed with stitches (sutures), staples, or skin adhesive strips.  A bandage (dressing) will be placed on your skin over the incision. The procedure may vary among health care providers and hospitals. What happens after the procedure?  Your blood pressure, heart rate, breathing rate, and blood oxygen level will be monitored until you leave the hospital.  You will be given pain medicine as needed.  You will slowly start to drink fluids and have a soft diet at first. You will most likely return to your usual diet the day after surgery.  You will be encouraged to walk as soon as possible. You will also use a device or do breathing exercises to keep your lungs clear. Summary  An open splenectomy is a surgery to remove the spleen. It may be needed if the spleen is damaged from an injury or certain diseases or conditions.  Follow instructions from your health care provider about taking medicines and about eating and drinking before the procedure.  Before the procedure, you may be given vaccinations to help prevent infections. These are needed because not having a spleen may affect your body's ability to fight infections. This information is not intended to replace advice given to you by your health care provider. Make sure you discuss any questions you have with your health care provider. Document Revised: 01/11/2018 Document Reviewed: 01/11/2018 Elsevier Patient Education  2020 [employer].  ........Marland Kitchen   Managing Your Pain After Surgery Without Opioids    Thank you for participating in our program to help patients manage their pain after surgery without  opioids. This is part of our effort to provide you with the best care possible, without exposing you or your family to the risk that opioids pose.  What pain can I expect after surgery? You can expect to have some pain after surgery. This is normal. The pain is typically worse the day after surgery, and quickly begins to get better. Many studies have found that many patients are able to manage their pain after surgery with Over-the-Counter (OTC) medications such as Tylenol and Motrin. If you have a condition that does not allow you to take Tylenol or Motrin, notify your surgical team.  How will I manage my pain? The best strategy for controlling your pain after surgery is around the clock pain control with Tylenol (acetaminophen) and Motrin (ibuprofen or Advil). Alternating these medications with each other allows you to maximize your pain control. In addition to Tylenol and Motrin, you can use heating pads or  ice packs on your incisions to help reduce your pain.  How will I alternate your regular strength over-the-counter pain medication? You will take a dose of pain medication every three hours. ; Start by taking 650 mg of Tylenol (2 pills of 325 mg) ; 3 hours later take 600 mg of Motrin (3 pills of 200 mg) ; 3 hours after taking the Motrin take 650 mg of Tylenol ; 3 hours after that take 600 mg of Motrin.   - 1 -  See example - if your first dose of Tylenol is at 12:00 PM   12:00 PM Tylenol 650 mg (2 pills of 325 mg)  3:00 PM Motrin 600 mg (3 pills of 200 mg)  6:00 PM Tylenol 650 mg (2 pills of 325 mg)  9:00 PM Motrin 600 mg (3 pills of 200 mg)  Continue alternating every 3 hours   We recommend that you follow this schedule around-the-clock for at least 3 days after surgery, or until you feel that it is no longer needed. Use the table on the last page of this handout to keep track of the medications you are taking. Important: Do not take more than 3000mg  of Tylenol or 3200mg  of  Motrin in a 24-hour period. Do not take ibuprofen/Motrin if you have a history of bleeding stomach ulcers, severe kidney disease, &/or actively taking a blood thinner  What if I still have pain? If you have pain that is not controlled with the over-the-counter pain medications (Tylenol and Motrin or Advil) you might have what we call "breakthrough" pain. You will receive a prescription for a small amount of an opioid pain medication such as Oxycodone, Tramadol, or Tylenol with Codeine. Use these opioid pills in the first 24 hours after surgery if you have breakthrough pain. Do not take more than 1 pill every 4-6 hours.  If you still have uncontrolled pain after using all opioid pills, don't hesitate to call our staff using the number provided. We will help make sure you are managing your pain in the best way possible, and if necessary, we can provide a prescription for additional pain medication.   Day 1    Time  Name of Medication Number of pills taken  Amount of Acetaminophen  Pain Level   Comments  AM PM       AM PM       AM PM       AM PM       AM PM       AM PM       AM PM       AM PM       Total Daily amount of Acetaminophen Do not take more than  3,000 mg per day      Day 2    Time  Name of Medication Number of pills taken  Amount of Acetaminophen  Pain Level   Comments  AM PM       AM PM       AM PM       AM PM       AM PM       AM PM       AM PM       AM PM       Total Daily amount of Acetaminophen Do not take more than  3,000 mg per day      Day 3    Time  Name of Medication Number of pills  taken  Amount of Acetaminophen  Pain Level   Comments  AM PM       AM PM       AM PM       AM PM          AM PM       AM PM       AM PM       AM PM       Total Daily amount of Acetaminophen Do not take more than  3,000 mg per day      Day 4    Time  Name of Medication Number of pills taken  Amount of Acetaminophen  Pain Level   Comments  AM  PM       AM PM       AM PM       AM PM       AM PM       AM PM       AM PM       AM PM       Total Daily amount of Acetaminophen Do not take more than  3,000 mg per day      Day 5    Time  Name of Medication Number of pills taken  Amount of Acetaminophen  Pain Level   Comments  AM PM       AM PM       AM PM       AM PM       AM PM       AM PM       AM PM       AM PM       Total Daily amount of Acetaminophen Do not take more than  3,000 mg per day       Day 6    Time  Name of Medication Number of pills taken  Amount of Acetaminophen  Pain Level  Comments  AM PM       AM PM       AM PM       AM PM       AM PM       AM PM       AM PM       AM PM       Total Daily amount of Acetaminophen Do not take more than  3,000 mg per day      Day 7    Time  Name of Medication Number of pills taken  Amount of Acetaminophen  Pain Level   Comments  AM PM       AM PM       AM PM       AM PM       AM PM       AM PM       AM PM       AM PM       Total Daily amount of Acetaminophen Do not take more than  3,000 mg per day        For additional information about how and where to safely dispose of unused opioid medications - [email or web address]  Disclaimer: This document contains information and/or instructional materials adapted from [facility] for the typical patient with your condition. It does not replace medical advice from your health care provider because your experience may differ from that of the typical patient. Talk to your health  care provider if you have any questions about this document, your condition or your treatment plan. Adapted from [facility]

## 2019-06-03 ENCOUNTER — Encounter (HOSPITAL_BASED_OUTPATIENT_CLINIC_OR_DEPARTMENT_OTHER): Payer: Self-pay | Admitting: Emergency Medicine
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
## 2019-06-11 ENCOUNTER — Other Ambulatory Visit: Payer: Self-pay | Admitting: Student

## 2019-06-11 DIAGNOSIS — M25512 Pain in left shoulder: Secondary | ICD-10-CM

## 2019-06-13 ENCOUNTER — Ambulatory Visit
Admission: RE | Admit: 2019-06-13 | Discharge: 2019-06-13 | Disposition: A | Discharge status: Home / Self Care | Payer: Self-pay | Source: Ambulatory Visit | Attending: Student | Admitting: Student

## 2019-06-13 ENCOUNTER — Ambulatory Visit: Payer: Medicaid Other | Admitting: Physician Assistant

## 2019-06-13 ENCOUNTER — Other Ambulatory Visit: Payer: Self-pay

## 2019-06-13 ENCOUNTER — Encounter: Payer: Self-pay | Admitting: Physician Assistant

## 2019-06-13 VITALS — BP 116/80 | HR 95 | Temp 97.7°F | Wt 133.0 lb

## 2019-06-13 DIAGNOSIS — D500 Iron deficiency anemia secondary to blood loss (chronic): Secondary | ICD-10-CM

## 2019-06-13 DIAGNOSIS — M25512 Pain in left shoulder: Secondary | ICD-10-CM

## 2019-06-13 DIAGNOSIS — E876 Hypokalemia: Secondary | ICD-10-CM

## 2019-06-13 DIAGNOSIS — Z9081 Acquired absence of spleen: Secondary | ICD-10-CM

## 2019-06-13 DIAGNOSIS — Z7689 Persons encountering health services in other specified circumstances: Secondary | ICD-10-CM

## 2019-06-13 DIAGNOSIS — F489 Nonpsychotic mental disorder, unspecified: Secondary | ICD-10-CM

## 2019-06-13 DIAGNOSIS — F17200 Nicotine dependence, unspecified, uncomplicated: Secondary | ICD-10-CM
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
## 2019-07-01 ENCOUNTER — Ambulatory Visit: Payer: Self-pay | Admitting: Physician Assistant

## 2019-07-19 ENCOUNTER — Encounter (HOSPITAL_COMMUNITY): Payer: Self-pay | Admitting: *Deleted

## 2019-07-19 ENCOUNTER — Other Ambulatory Visit: Payer: Self-pay

## 2019-07-19 ENCOUNTER — Emergency Department (HOSPITAL_COMMUNITY)
Admission: EM | Admit: 2019-07-19 | Discharge: 2019-07-19 | Disposition: A | Discharge status: Home / Self Care | Payer: Medicaid Other | Attending: Emergency Medicine | Admitting: Emergency Medicine

## 2019-07-19 ENCOUNTER — Emergency Department (HOSPITAL_COMMUNITY): Payer: Medicaid Other

## 2019-07-19 DIAGNOSIS — J449 Chronic obstructive pulmonary disease, unspecified: Secondary | ICD-10-CM | POA: Insufficient documentation

## 2019-07-19 DIAGNOSIS — F17210 Nicotine dependence, cigarettes, uncomplicated: Secondary | ICD-10-CM | POA: Insufficient documentation

## 2019-07-19 DIAGNOSIS — I10 Essential (primary) hypertension: Secondary | ICD-10-CM | POA: Insufficient documentation

## 2019-07-19 DIAGNOSIS — Z79899 Other long term (current) drug therapy: Secondary | ICD-10-CM | POA: Insufficient documentation

## 2019-07-19 DIAGNOSIS — R1084 Generalized abdominal pain: Secondary | ICD-10-CM

## 2019-07-19 DIAGNOSIS — F419 Anxiety disorder, unspecified: Secondary | ICD-10-CM | POA: Insufficient documentation

## 2019-07-19 LAB — COMPREHENSIVE METABOLIC PANEL
ALT: 64 U/L — ABNORMAL HIGH (ref 0–44)
AST: 53 U/L — ABNORMAL HIGH (ref 15–41)
Albumin: 3.4 g/dL — ABNORMAL LOW (ref 3.5–5.0)
Alkaline Phosphatase: 116 U/L (ref 38–126)
Anion gap: 8 (ref 5–15)
BUN: 20 mg/dL (ref 6–20)
CO2: 25 mmol/L (ref 22–32)
Calcium: 8.3 mg/dL — ABNORMAL LOW (ref 8.9–10.3)
Chloride: 104 mmol/L (ref 98–111)
Creatinine, Ser: 0.59 mg/dL (ref 0.44–1.00)
GFR calc Af Amer: >60 mL/min (ref >60)
GFR calc non Af Amer: >60 mL/min (ref >60)
Glucose, Bld: 100 mg/dL — ABNORMAL HIGH (ref 70–99)
Potassium: 4.4 mmol/L (ref 3.5–5.1)
Sodium: 137 mmol/L (ref 135–145)
Total Bilirubin: 0.3 mg/dL (ref 0.3–1.2)
Total Protein: 6.7 g/dL (ref 6.5–8.1)

## 2019-07-19 LAB — COOXEMETRY PANEL
Carboxyhemoglobin: 2.6 % — ABNORMAL HIGH (ref 0.5–1.5)
Methemoglobin: 1.1 % (ref 0.0–1.5)
O2 Saturation: 97.1 %
Total hemoglobin: 10.3 g/dL — ABNORMAL LOW (ref 12.0–16.0)

## 2019-07-19 LAB — CBC WITH DIFFERENTIAL/PLATELET
Abs Immature Granulocytes: 0.01 10*3/uL (ref 0.00–0.07)
Basophils Absolute: 0 10*3/uL (ref 0.0–0.1)
Basophils Relative: 1 %
Eosinophils Absolute: 0.1 10*3/uL (ref 0.0–0.5)
Eosinophils Relative: 2 %
HCT: 35.2 % — ABNORMAL LOW (ref 36.0–46.0)
Hemoglobin: 11.1 g/dL — ABNORMAL LOW (ref 12.0–15.0)
Immature Granulocytes: 0 %
Lymphocytes Relative: 50 %
Lymphs Abs: 2.9 10*3/uL (ref 0.7–4.0)
MCH: 29.2 pg (ref 26.0–34.0)
MCHC: 31.5 g/dL (ref 30.0–36.0)
MCV: 92.6 fL (ref 80.0–100.0)
Monocytes Absolute: 0.7 10*3/uL (ref 0.1–1.0)
Monocytes Relative: 12 %
Neutro Abs: 2 10*3/uL (ref 1.7–7.7)
Neutrophils Relative %: 35 %
Platelets: 576 10*3/uL — ABNORMAL HIGH (ref 150–400)
RBC: 3.8 MIL/uL — ABNORMAL LOW (ref 3.87–5.11)
RDW: 14.5 % (ref 11.5–15.5)
WBC: 5.8 10*3/uL (ref 4.0–10.5)
nRBC: 0 % (ref 0.0–0.2)

## 2019-07-19 LAB — URINALYSIS, ROUTINE W REFLEX MICROSCOPIC
Bacteria, UA: NONE SEEN
Bilirubin Urine: NEGATIVE
Glucose, UA: NEGATIVE mg/dL
Hgb urine dipstick: NEGATIVE
Ketones, ur: NEGATIVE mg/dL
Nitrite: NEGATIVE
Protein, ur: NEGATIVE mg/dL
Specific Gravity, Urine: 1.02 (ref 1.005–1.030)
pH: 6 (ref 5.0–8.0)

## 2019-07-19 LAB — RAPID URINE DRUG SCREEN, HOSP PERFORMED
Amphetamines: POSITIVE — AB
Barbiturates: NOT DETECTED
Benzodiazepines: NOT DETECTED
Cocaine: NOT DETECTED
Opiates: NOT DETECTED
Tetrahydrocannabinol: NOT DETECTED

## 2019-07-19 LAB — LIPASE, BLOOD: Lipase: 86 U/L — ABNORMAL HIGH (ref 11–51)

## 2019-07-19 LAB — ETHANOL: Alcohol, Ethyl (B): <10 mg/dL (ref <10)

## 2019-07-19 MED ORDER — LORAZEPAM 1 MG PO TABS
1.0000 mg | ORAL_TABLET | Freq: Once | ORAL | Status: AC
  Administered 2019-07-19: 11:00:00 1 mg via ORAL
  Filled 2019-07-19: qty 1

## 2019-07-19 MED ORDER — SODIUM CHLORIDE 0.9 % IV BOLUS
500.0000 mL | Freq: Once | INTRAVENOUS | Status: AC
  Administered 2019-07-19: 11:00:00 500 mL via INTRAVENOUS

## 2019-07-19 MED ORDER — ONDANSETRON HCL 4 MG/2ML IJ SOLN
4.0000 mg | Freq: Once | INTRAMUSCULAR | Status: AC
  Administered 2019-07-19: 14:00:00 4 mg via INTRAVENOUS
  Filled 2019-07-19: qty 2

## 2019-07-19 MED ORDER — IOHEXOL 300 MG/ML  SOLN
80.0000 mL | Freq: Once | INTRAMUSCULAR | Status: AC | PRN
  Administered 2019-07-19: 13:00:00 80 mL via INTRAVENOUS
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
## 2019-07-19 NOTE — ED Triage Notes (Signed)
Patient presents to the ED with chest pain and abdominal pain for one day.  Patient states she has been "sick" for one week and believe she is "delusional".  Patient is tearful and gets off track with her speech.
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
## 2019-08-09 IMAGING — DX DG SHOULDER 2+V*R*
4 series · 4 of 4 positions shown · non-contrast
Comparison: None.

CLINICAL DATA: Right shoulder pain for 3 months, no known injury,
initial encounter

EXAM:
RIGHT SHOULDER - 2+ VIEW

[shoulder grashey]
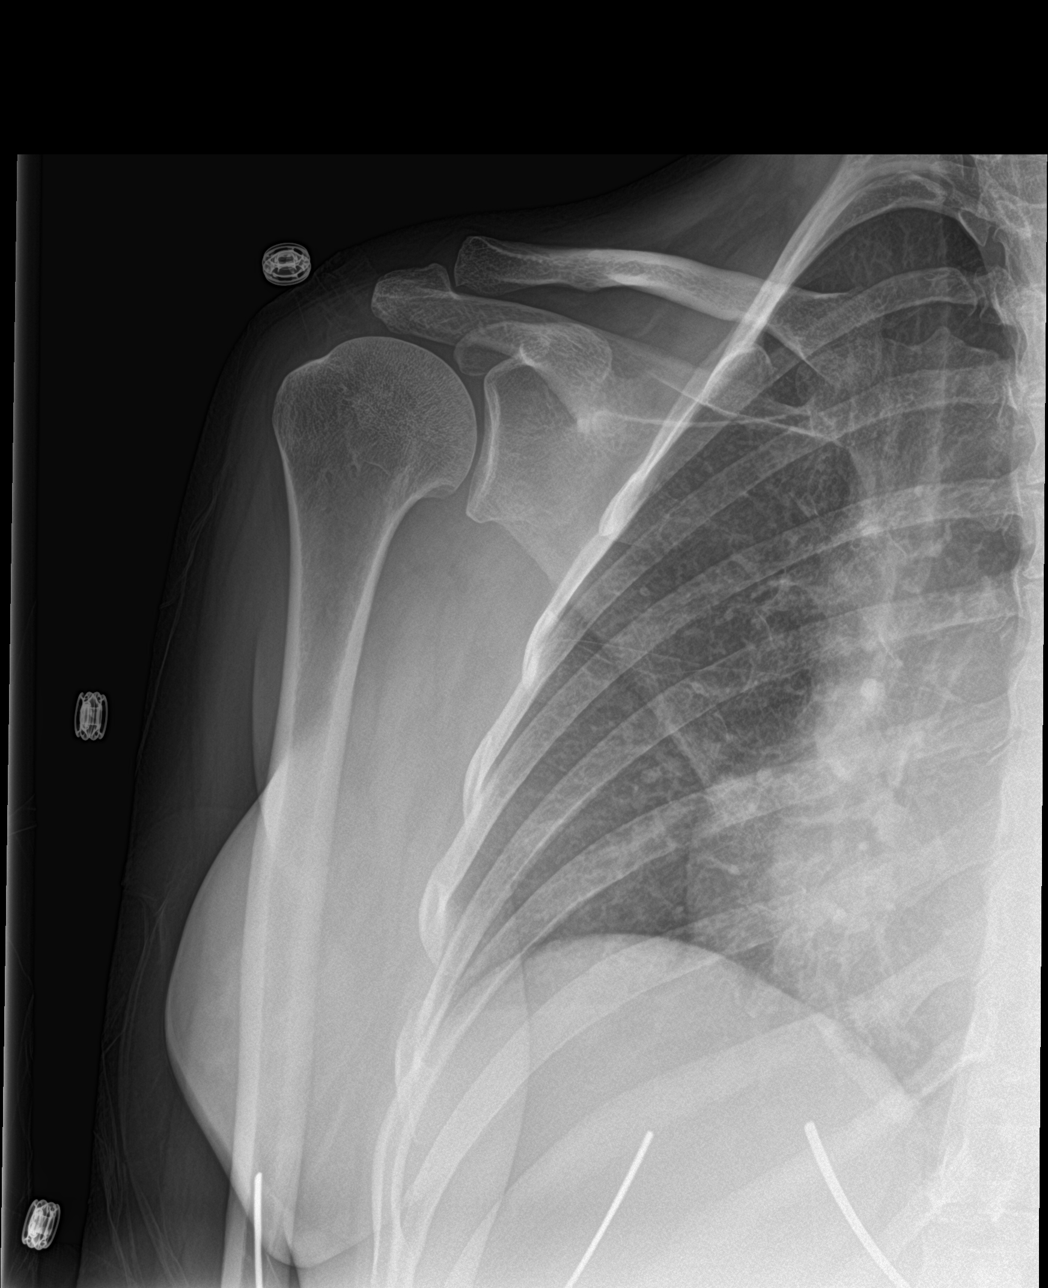

[shoulder y view (1 of 2)]
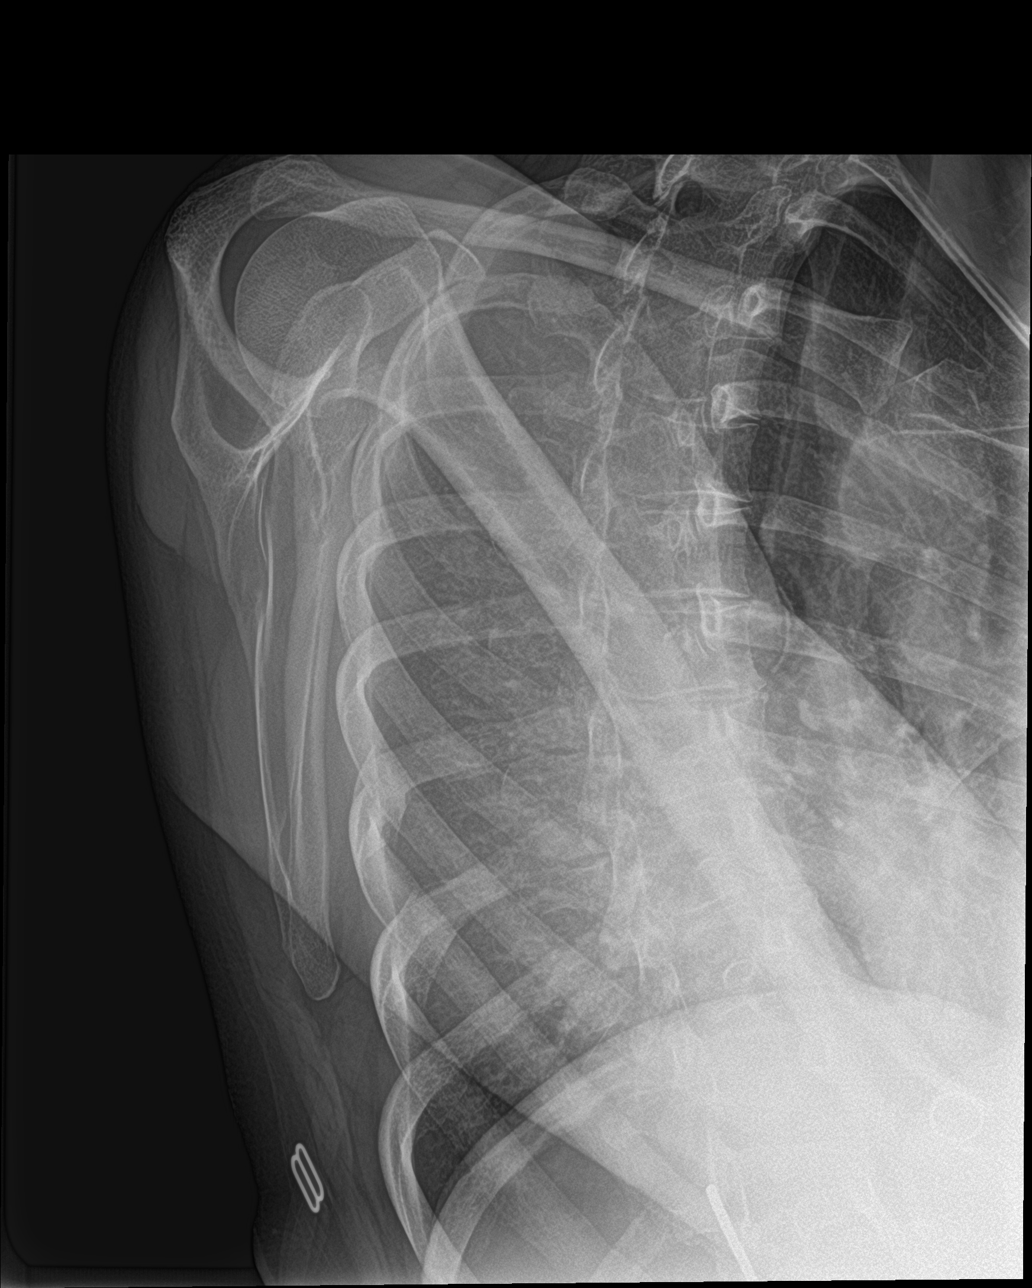

[shoulder axillary]
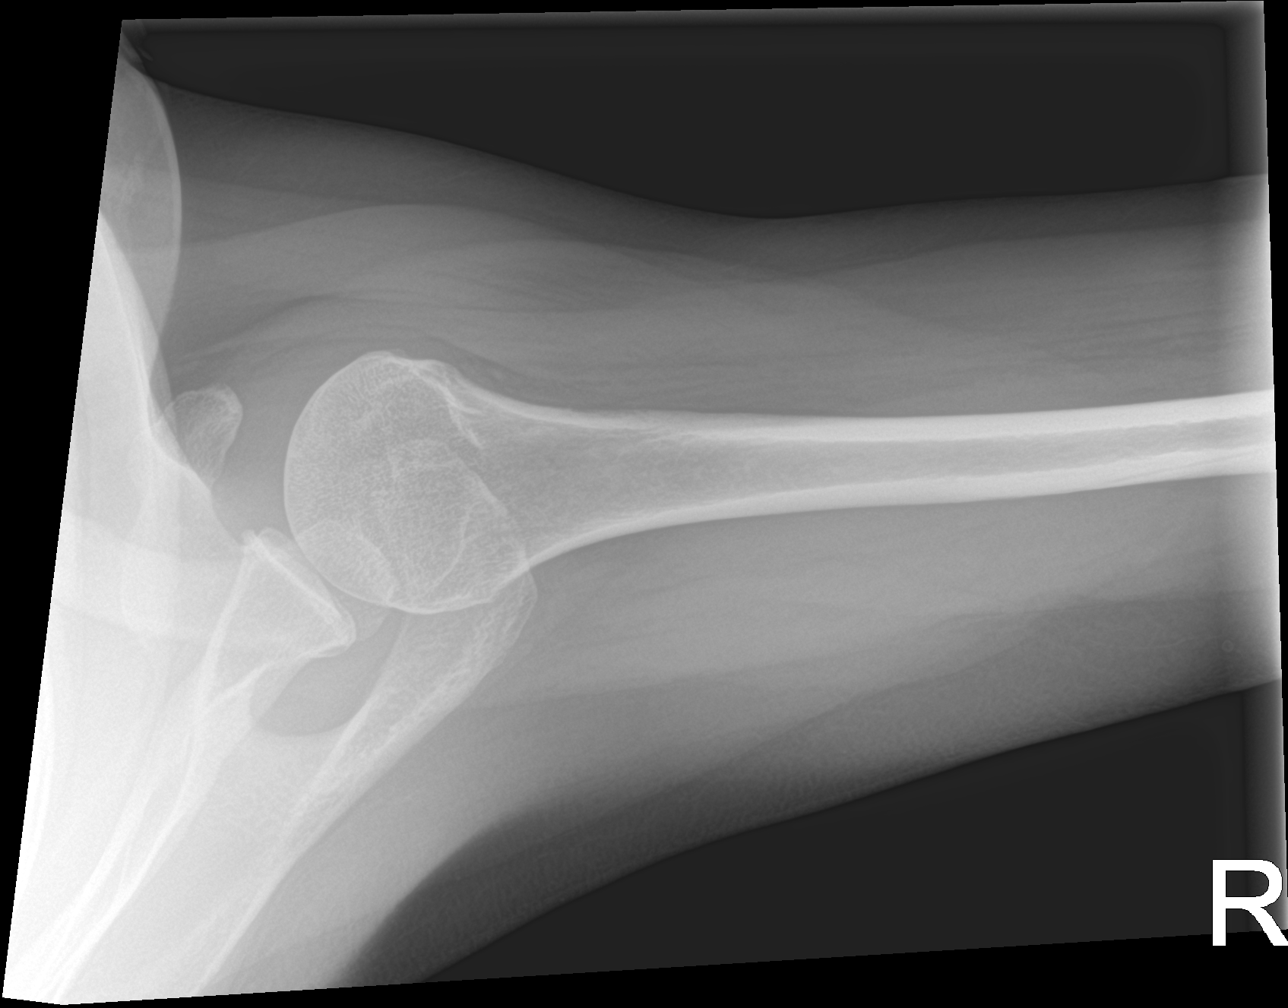

[shoulder y view (2 of 2)]
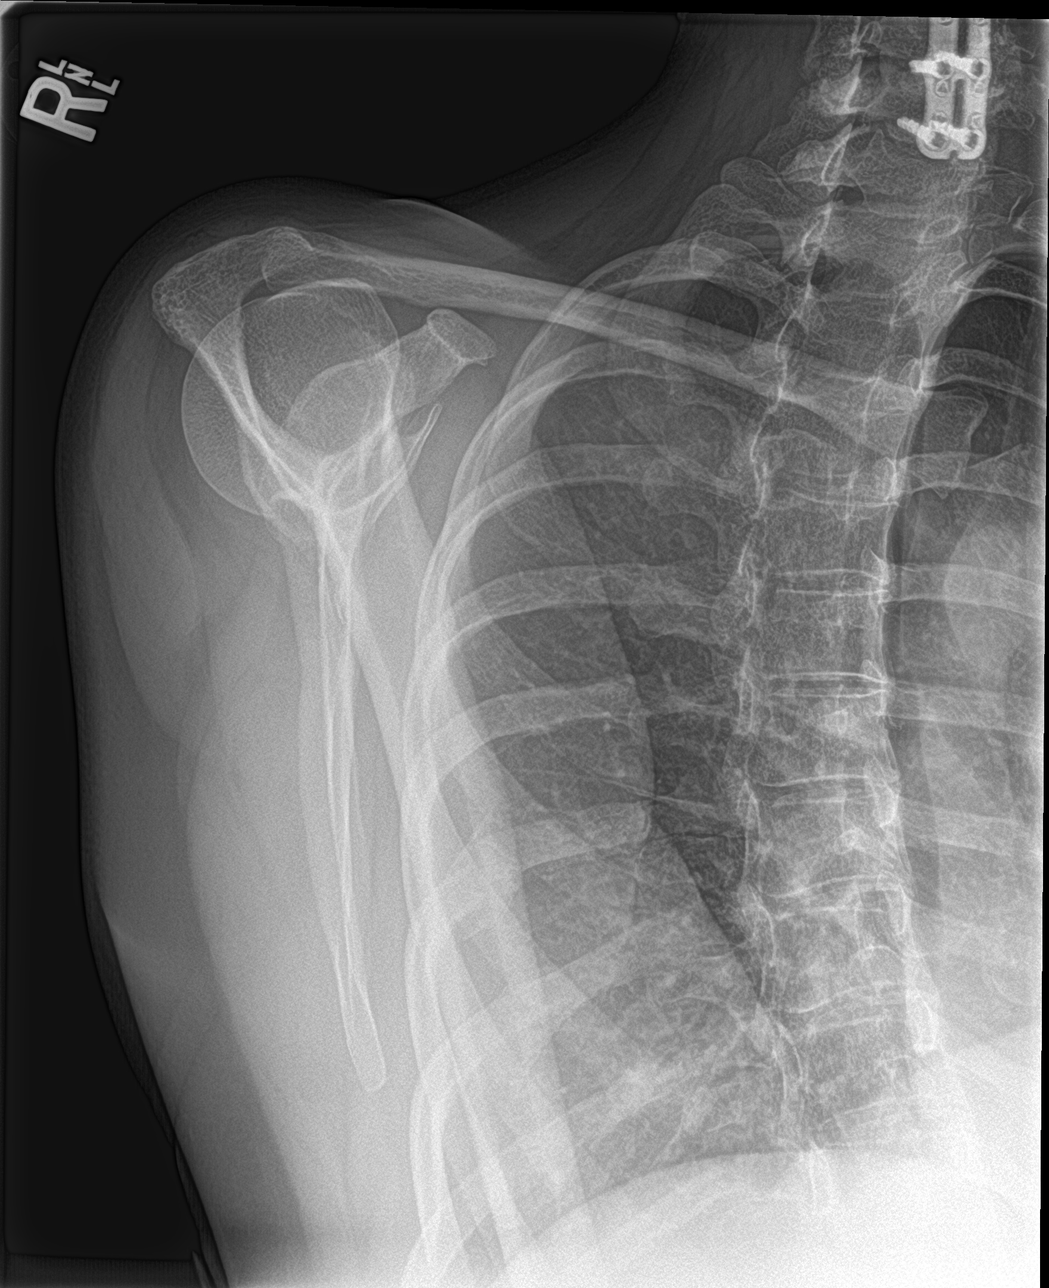

[4 of 4 positions shown; findings below may reference images not displayed]

FINDINGS: There is no evidence of fracture or dislocation. There is no
evidence of arthropathy or other focal bone abnormality. Soft
tissues are unremarkable.
IMPRESSION: No acute abnormality noted.

## 2019-08-13 ENCOUNTER — Ambulatory Visit: Payer: Medicaid Other | Admitting: Physician Assistant

## 2019-12-11 ENCOUNTER — Telehealth: Payer: Self-pay | Admitting: Physician Assistant

## 2019-12-11 NOTE — Telephone Encounter (Signed)
[name] called.  Reports she feels terrible, has SOB, can't do anything but sleep all the time, her feet and hands are turning blue, she is swelling and her teeth are falling out.   Discussed with pt that she needed to go to the ER now due to worrisome signs of sob and cyanosis.  She agrees.

## 2020-02-05 ENCOUNTER — Ambulatory Visit (HOSPITAL_COMMUNITY)
Admission: EM | Admit: 2020-02-05 | Discharge: 2020-02-05 | Payer: No Payment, Other | Attending: Psychiatry | Admitting: Psychiatry

## 2020-02-05 ENCOUNTER — Other Ambulatory Visit: Payer: Self-pay

## 2020-02-05 DIAGNOSIS — Z91410 Personal history of adult physical and sexual abuse: Secondary | ICD-10-CM | POA: Insufficient documentation

## 2020-02-05 DIAGNOSIS — F1520 Other stimulant dependence, uncomplicated: Secondary | ICD-10-CM | POA: Insufficient documentation

## 2020-02-05 DIAGNOSIS — F1910 Other psychoactive substance abuse, uncomplicated: Secondary | ICD-10-CM | POA: Insufficient documentation

## 2020-02-05 DIAGNOSIS — F4310 Post-traumatic stress disorder, unspecified: Secondary | ICD-10-CM | POA: Diagnosis not present
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
## 2020-06-15 IMAGING — DX DG RIBS W/ CHEST 3+V*L*
5 series · 5 of 5 positions shown · non-contrast
Comparison: Concurrent left shoulder radiographs. 11/18/2014 chest
radiograph.

CLINICAL DATA: 42 y/o F; left shoulder and axillary rib pain after
fall.

EXAM:
LEFT RIBS AND CHEST - 3+ VIEW

[chest pa]
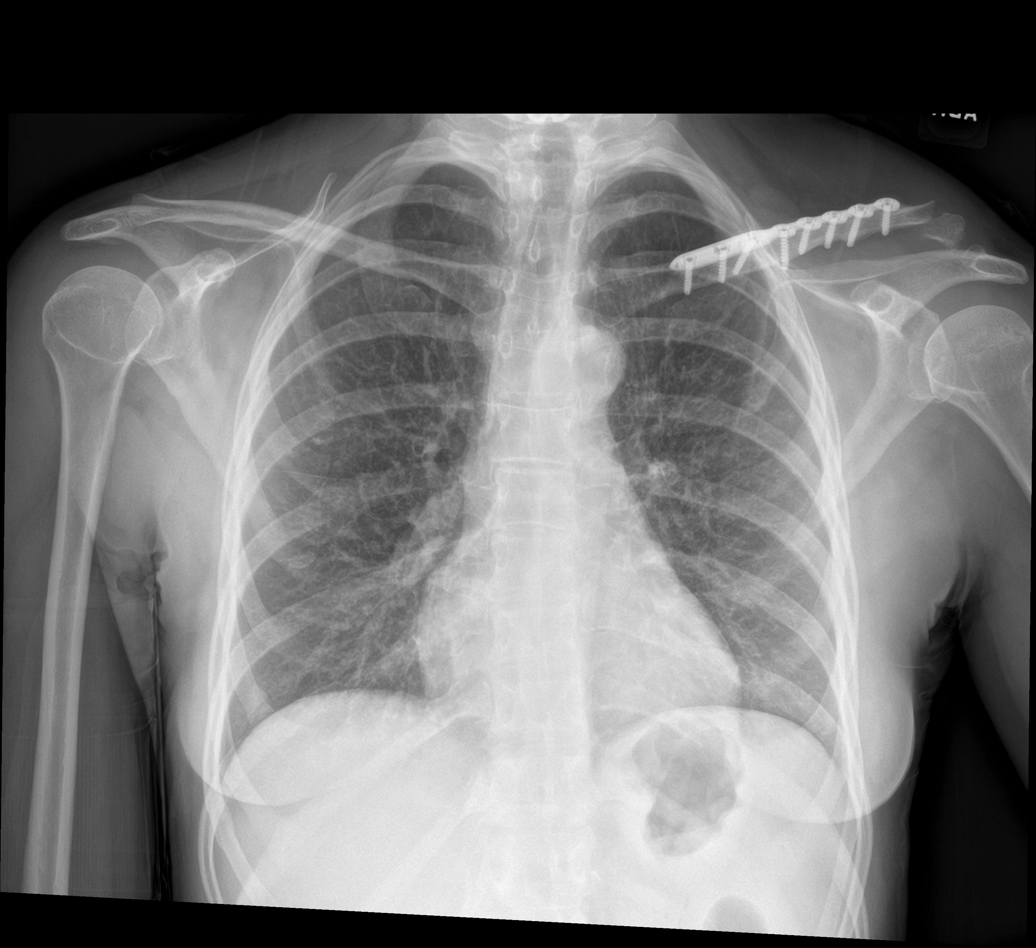

[rib pa obl (1 of 2)]
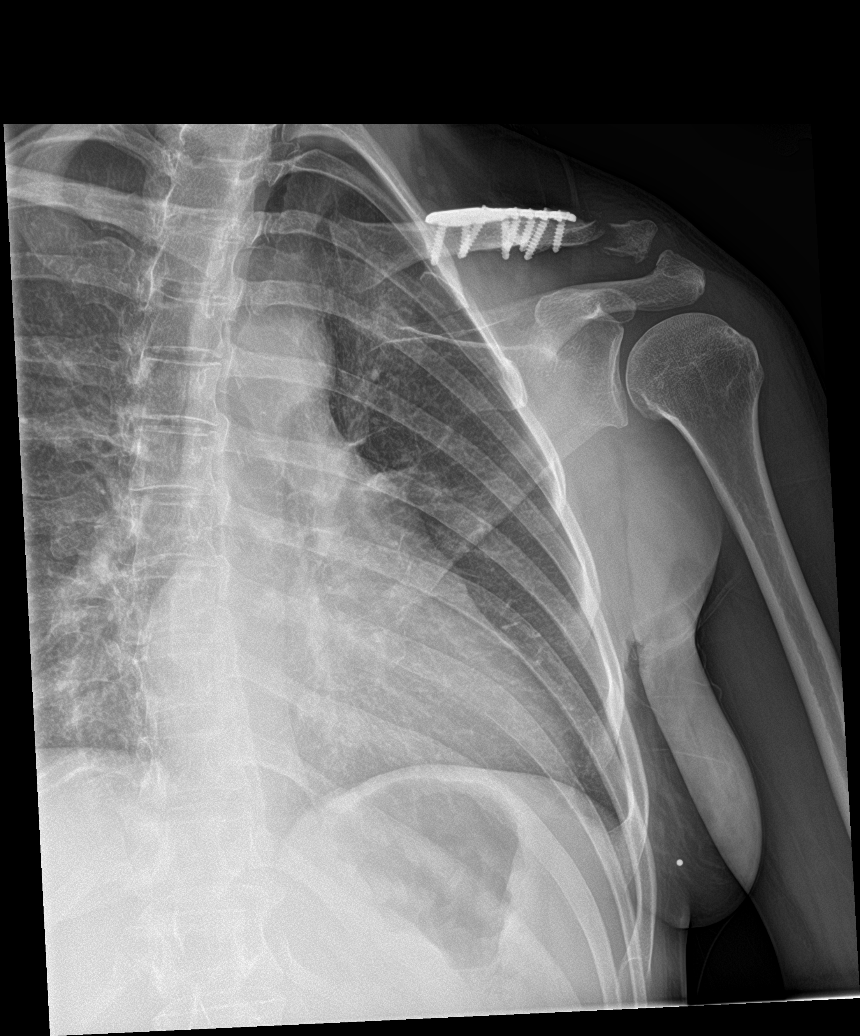

[rib pa obl (2 of 2)]
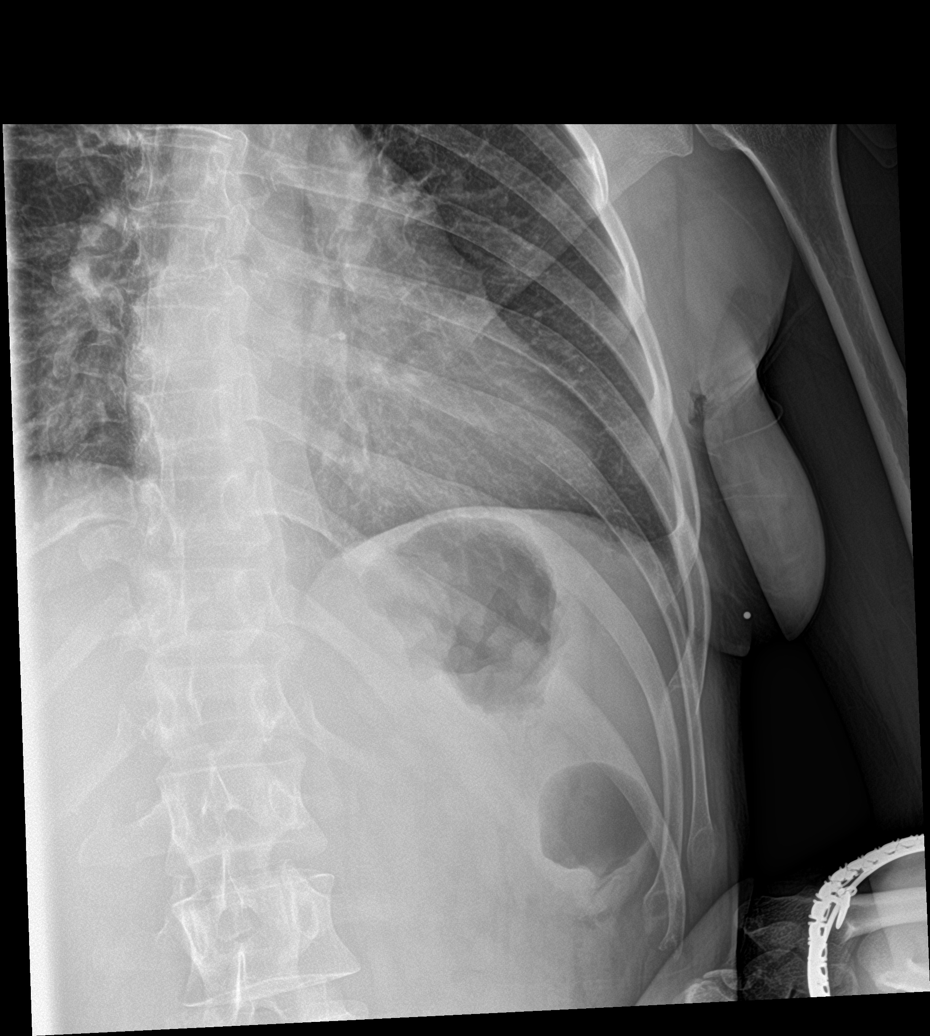

[rib pa (1 of 2)]
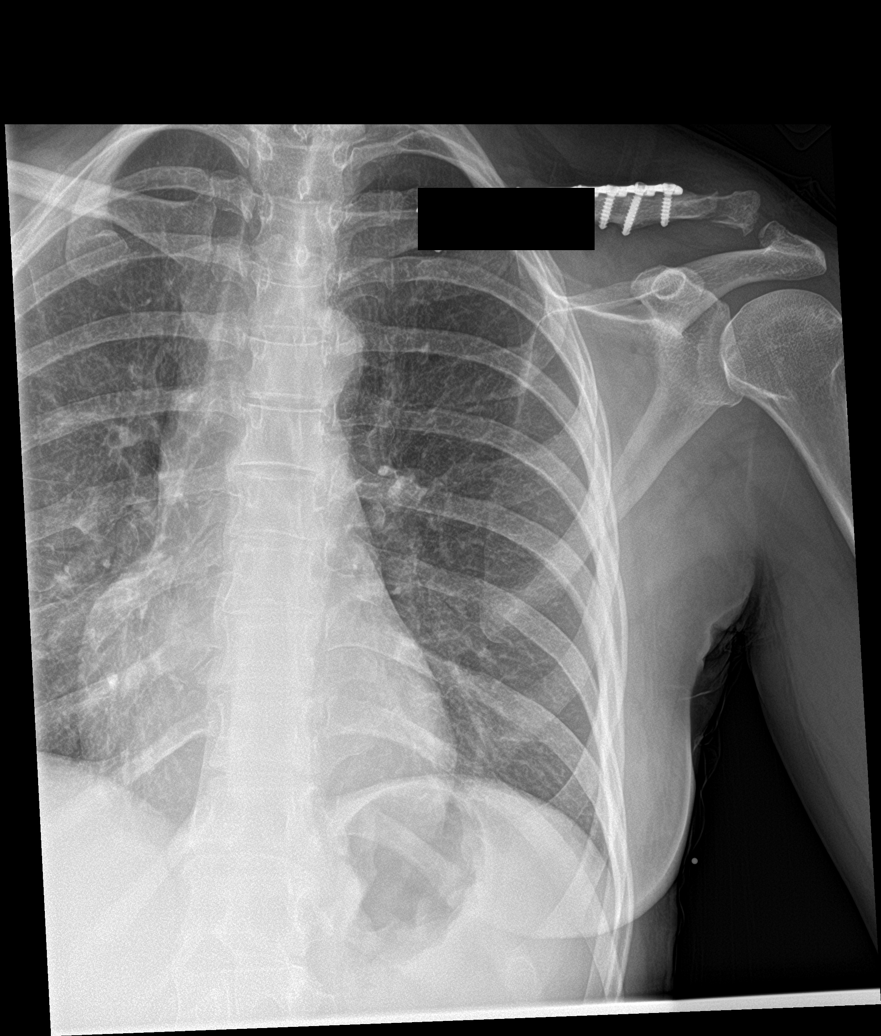

[rib pa (2 of 2)]
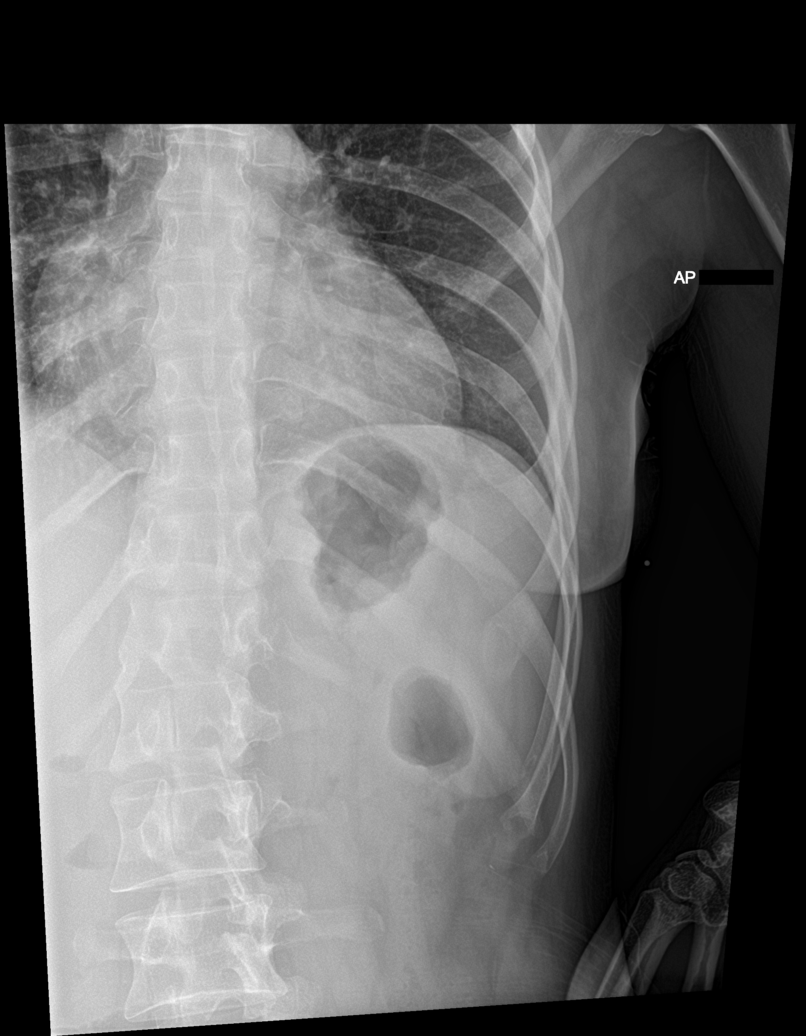

[5 of 5 positions shown; findings below may reference images not displayed]

FINDINGS: Slight cortical irregularity of the left anterolateral second rib
may represent a nondisplaced fracture. No other fracture identified.
No pneumothorax.

Left lateral clavicle acute displaced fracture better characterized
on left shoulder radiographs. Plate fixation of chronic mid left
clavicle fracture.
IMPRESSION: 1. Slight cortical irregularity of left anterolateral second rib may
represent nondisplaced fracture.
2. Left lateral clavicle acute displaced fracture better
characterized on left shoulder radiographs.

By: Eisha Mortensen M.D.

## 2020-06-15 IMAGING — DX DG SHOULDER 2+V*L*
2 series · 2 of 2 positions shown · non-contrast
Comparison: 08/26/2012 left shoulder radiographs.

CLINICAL DATA: 42 y/o F; left shoulder and axillary rib pain. Fall
down stairs.

EXAM:
LEFT SHOULDER - 2+ VIEW

[shoulder grashey]
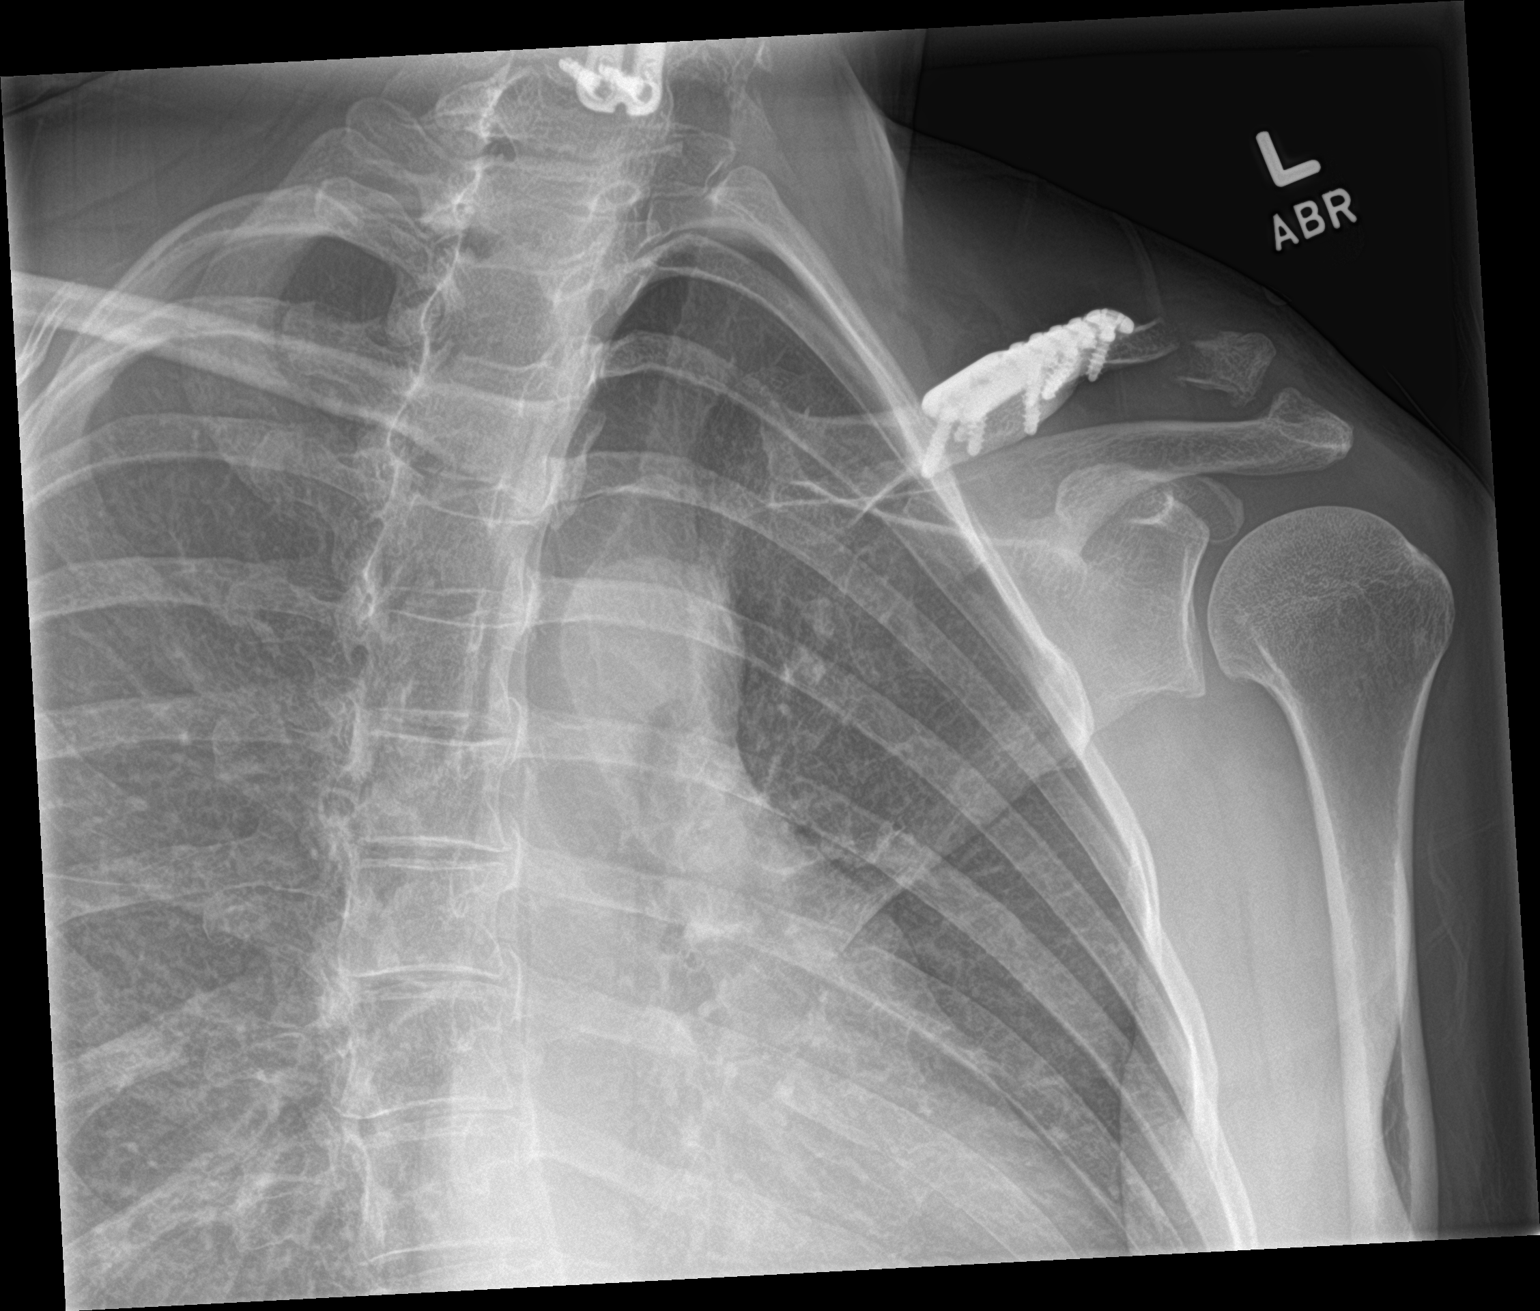

[shoulder y view]
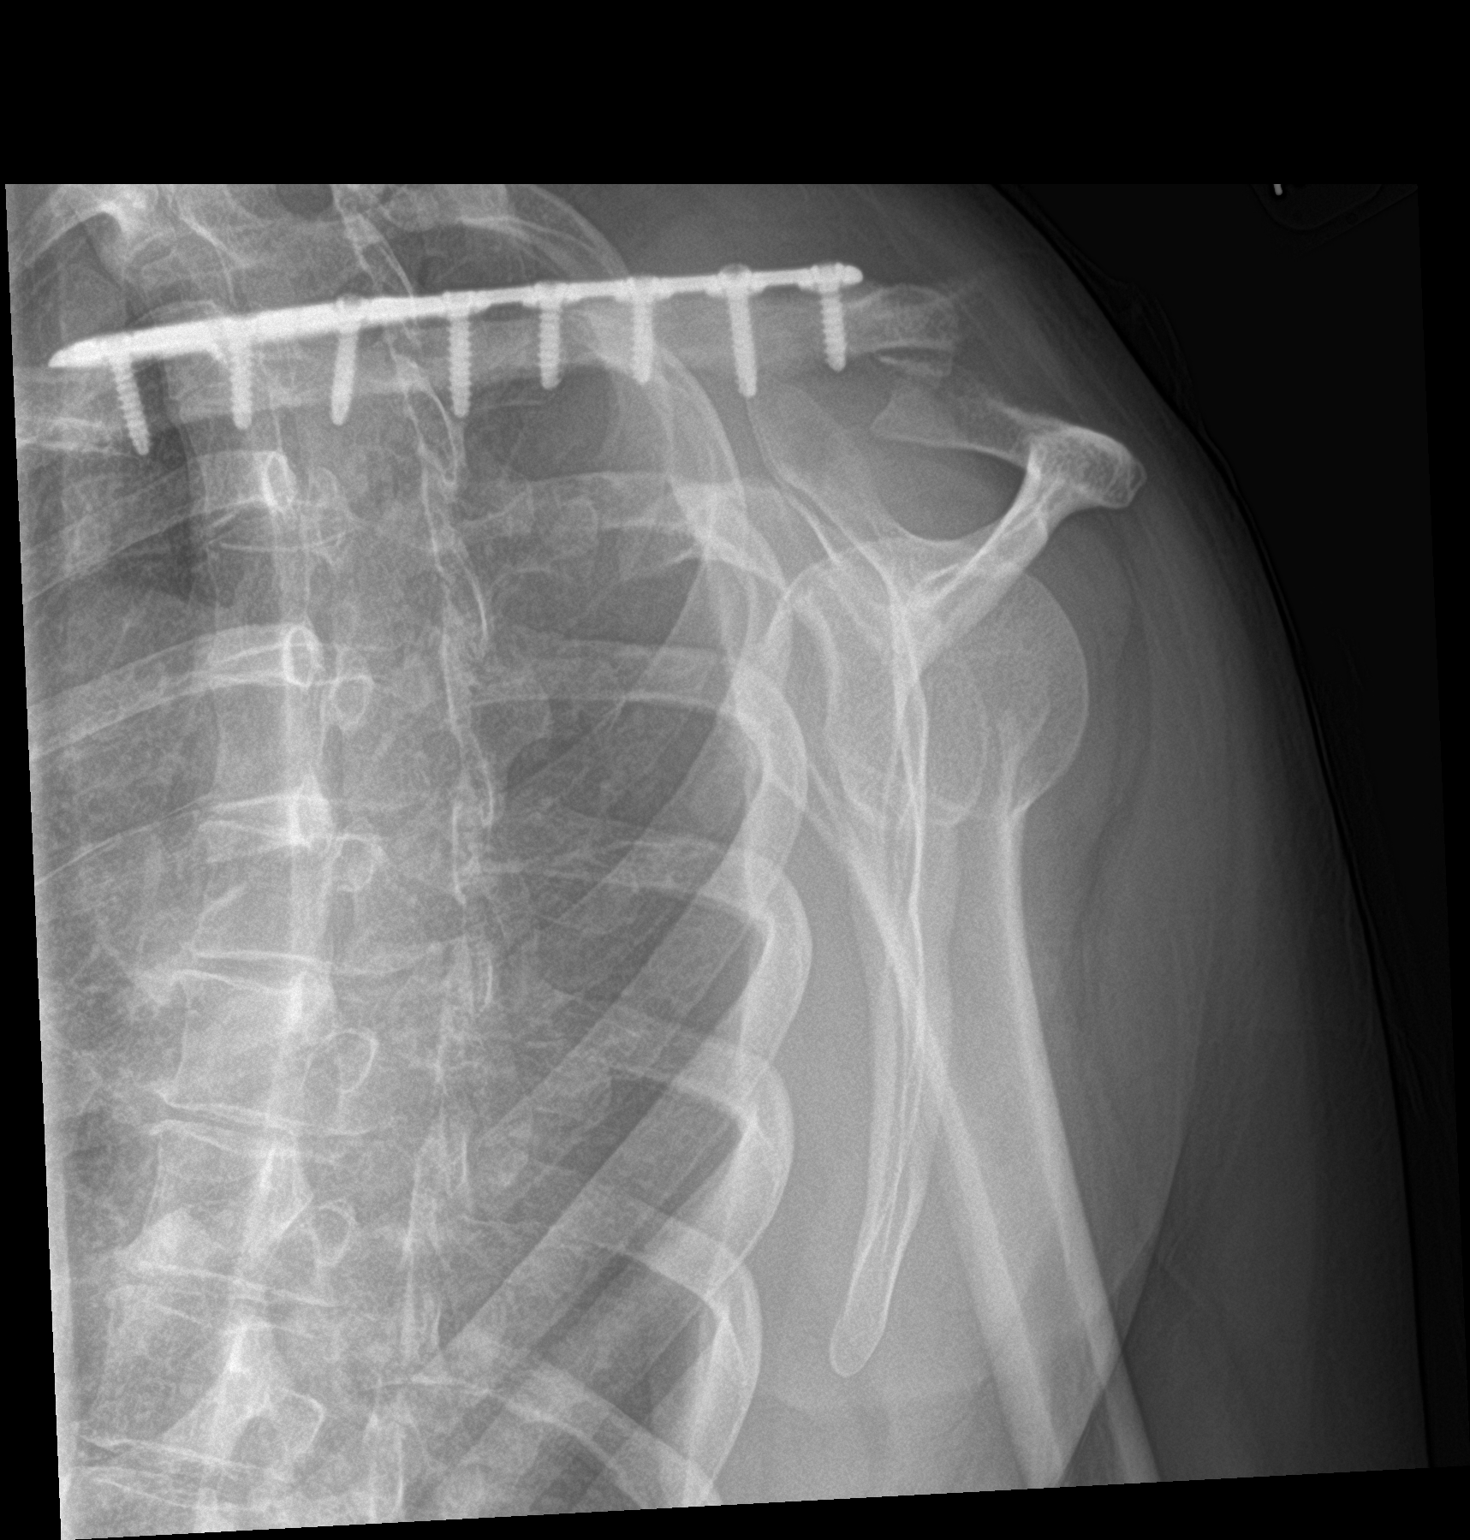

[2 of 2 positions shown; findings below may reference images not displayed]

FINDINGS: Acute displaced fracture of the lateral shaft of the left clavicle
with mild superior migration of the medial shaft component. Widening
of the coracoclavicular interval may represent ligament injury.
Normal acromioclavicular interval. No shoulder joint dislocation.
Chronic fracture deformity of left clavicle midshaft with intact
plate fixation hardware.
IMPRESSION: Acute displaced fracture of the lateral shaft of the left clavicle
with mild superior migration. Widening of the coracoclavicular
interval may represent ligament injury. No left shoulder joint
dislocation.

By: Faustina Hu M.D.
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
## 2020-06-29 IMAGING — CR DG CHEST 2V
2 series · 2 of 2 positions shown · non-contrast
Comparison: Chest radiograph October 25, 2017

CLINICAL DATA: Chest pain

EXAM:
CHEST - 2 VIEW

[w chest pa]
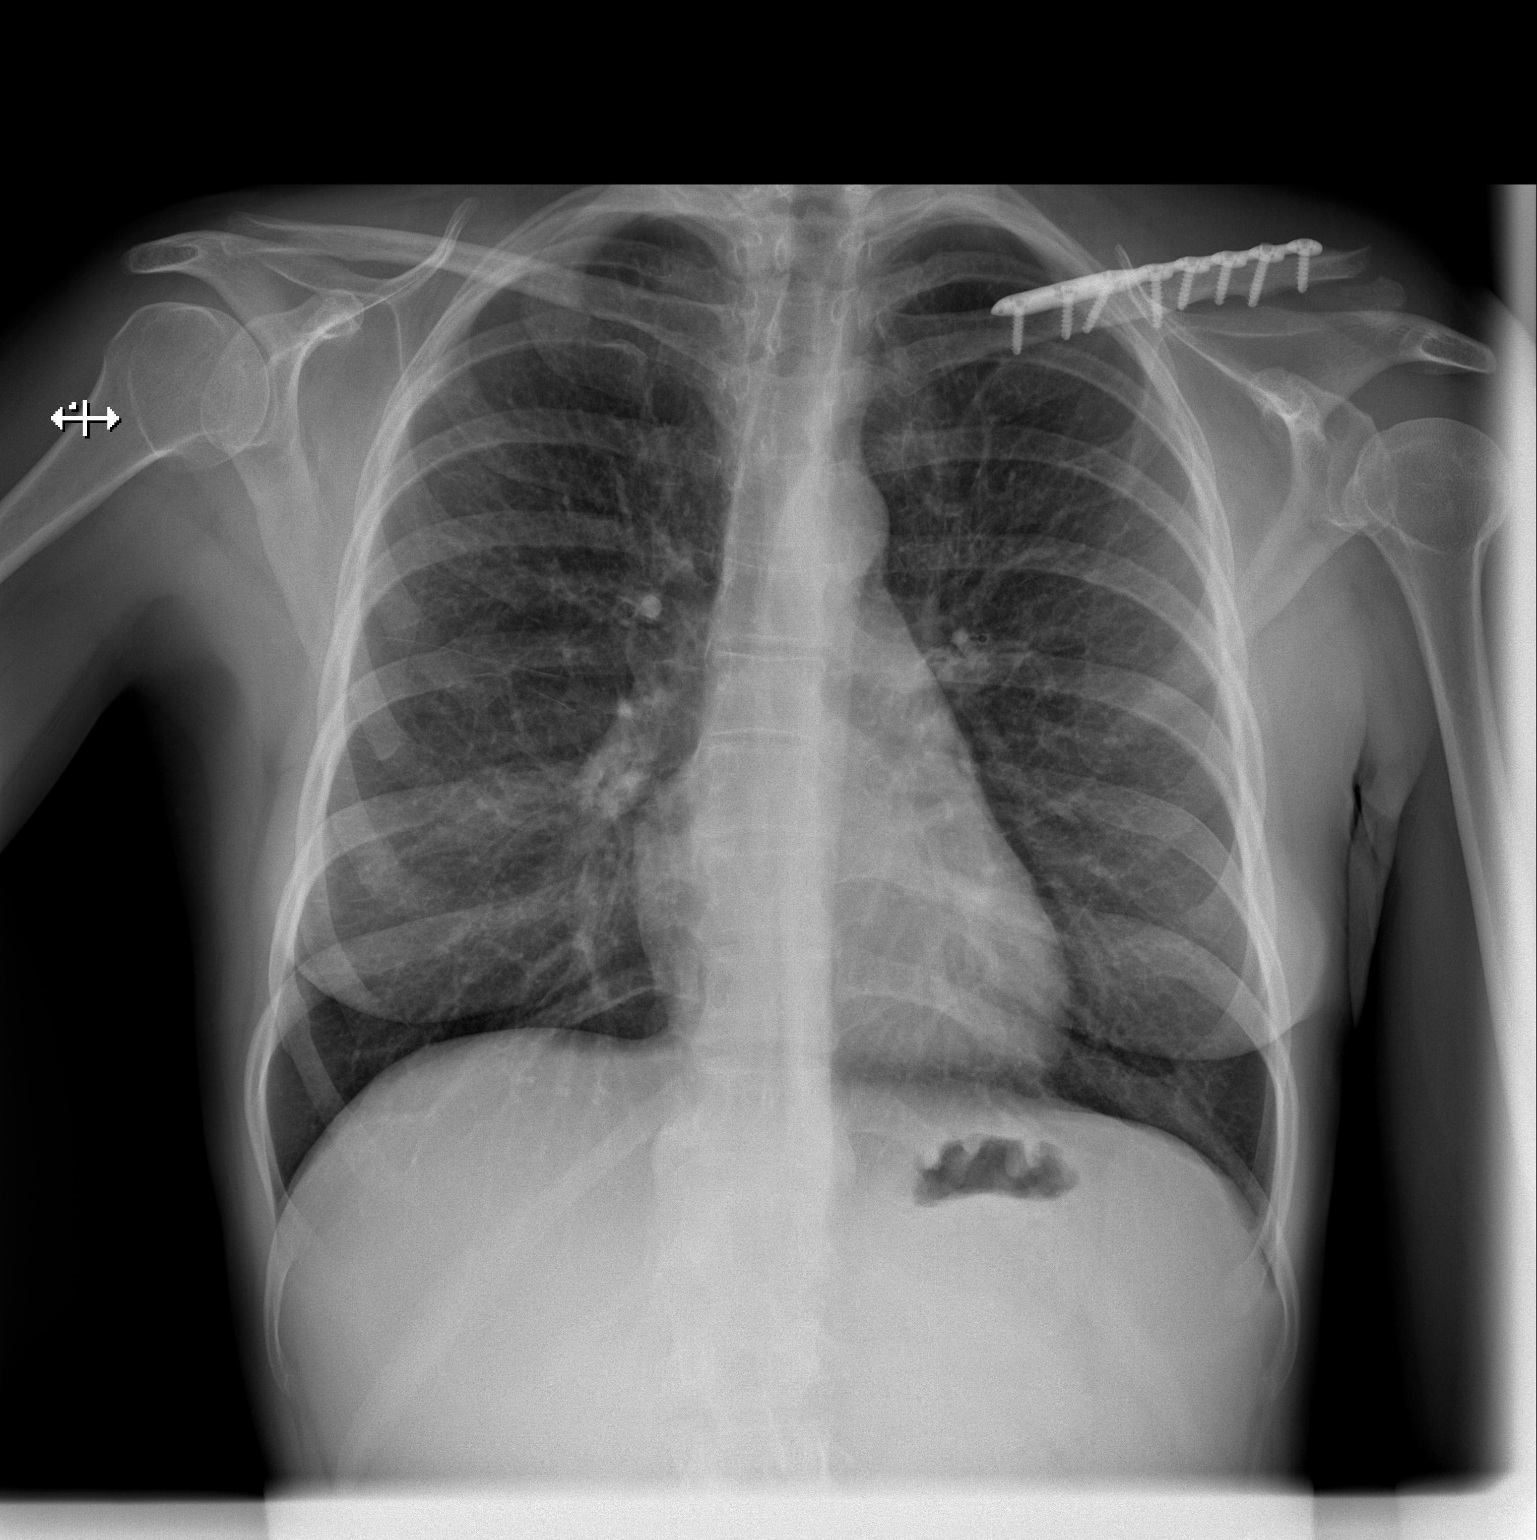

[w chest lat]
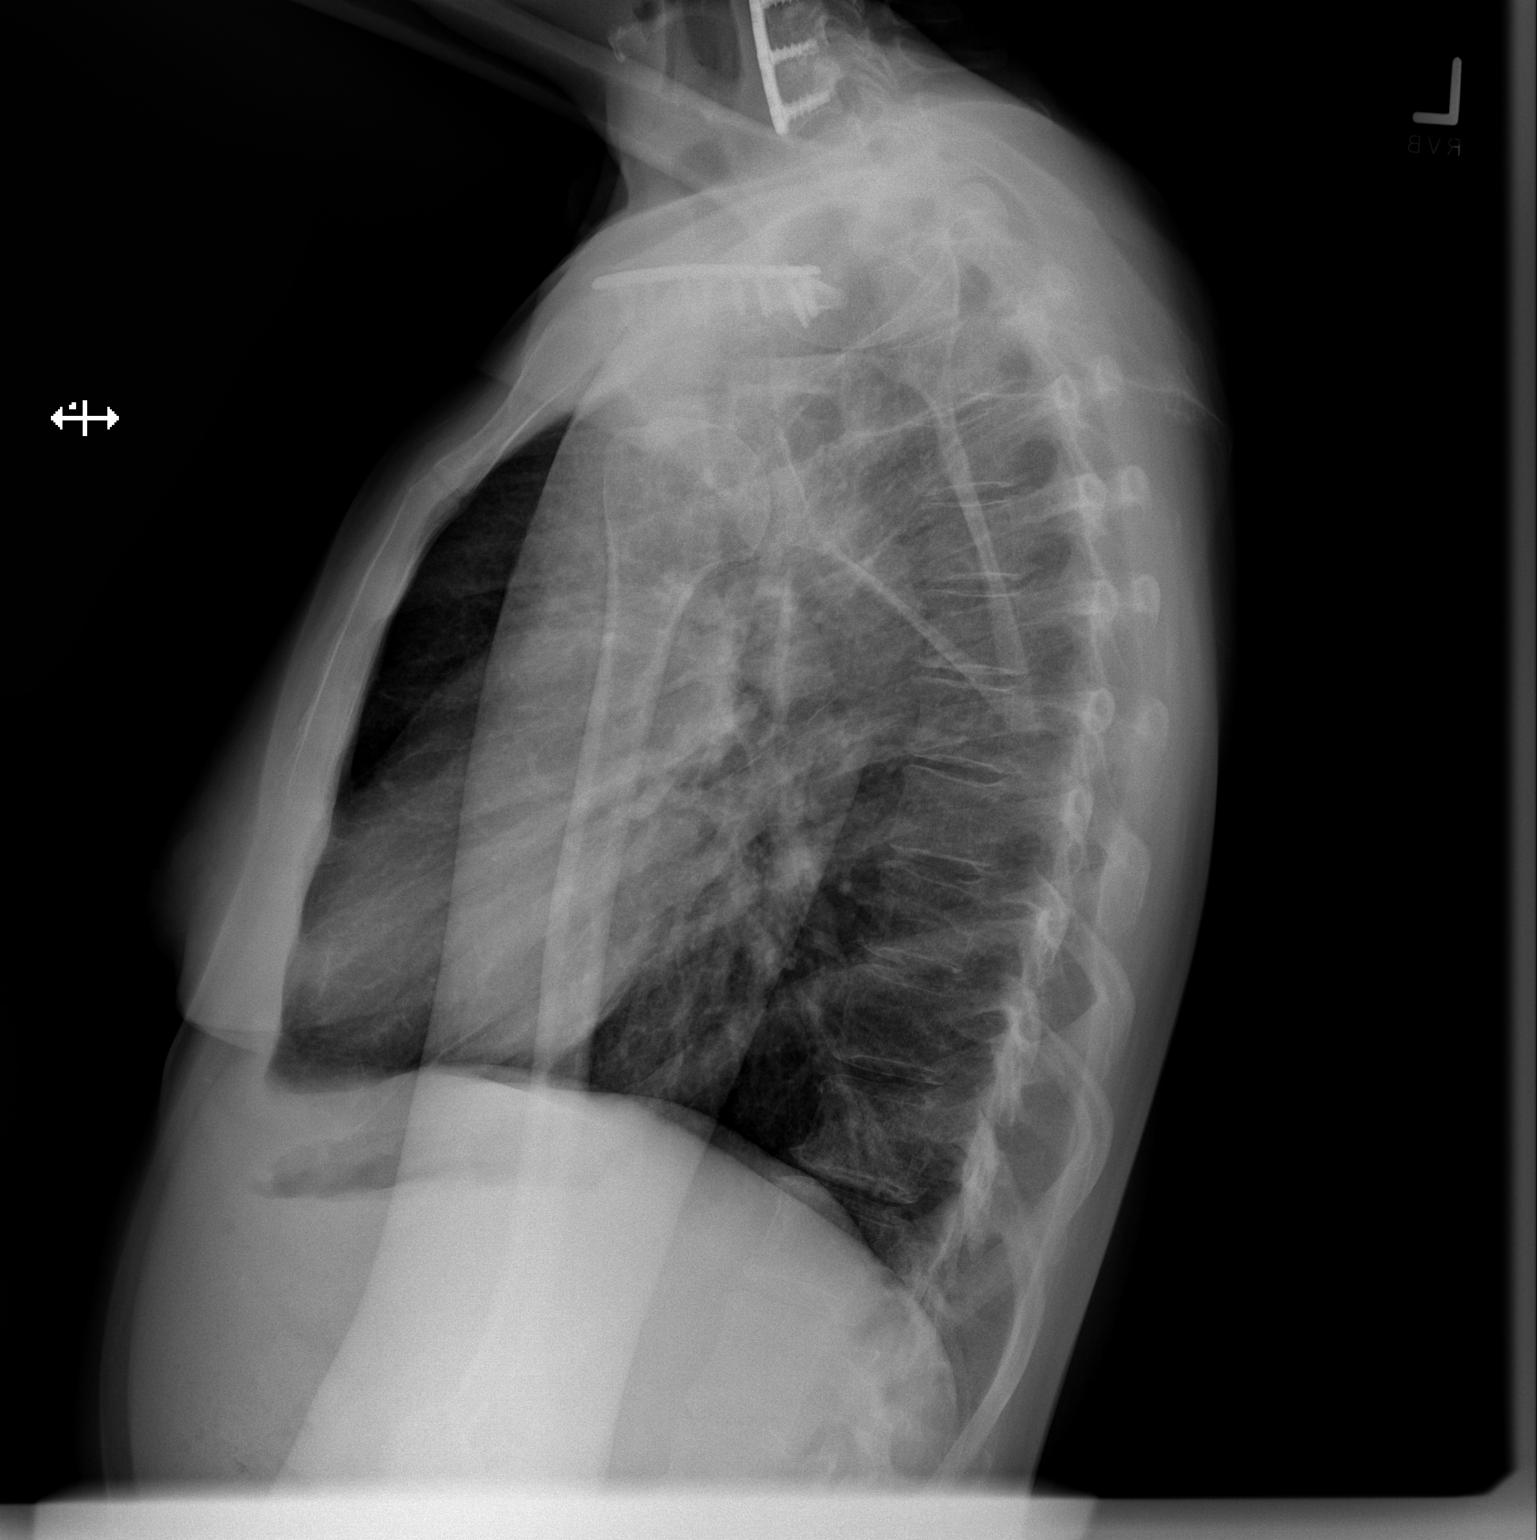

[2 of 2 positions shown; findings below may reference images not displayed]

FINDINGS: Lungs are clear. Heart size and pulmonary vascularity are normal. No
adenopathy. No pneumothorax. There is postoperative change in the
left clavicle. There is chronic acromioclavicular and
coracoclavicular separation on the left. There is postoperative
change in the lower cervical spine.
IMPRESSION: No edema or consolidation. Postoperative change left clavicle.
Chronic coracoclavicular and acromioclavicular separation on the
left, unchanged. Stable cardiac silhouette.

## 2020-07-07 ENCOUNTER — Other Ambulatory Visit: Payer: Self-pay

## 2020-07-07 ENCOUNTER — Encounter (HOSPITAL_COMMUNITY): Payer: Self-pay

## 2020-07-07 ENCOUNTER — Emergency Department (HOSPITAL_COMMUNITY)
Admission: EM | Admit: 2020-07-07 | Discharge: 2020-07-08 | Disposition: A | Discharge status: Home / Self Care | Payer: Self-pay | Attending: Emergency Medicine | Admitting: Emergency Medicine

## 2020-07-07 DIAGNOSIS — K439 Ventral hernia without obstruction or gangrene: Secondary | ICD-10-CM

## 2020-07-07 DIAGNOSIS — I10 Essential (primary) hypertension: Secondary | ICD-10-CM | POA: Insufficient documentation

## 2020-07-07 DIAGNOSIS — F17210 Nicotine dependence, cigarettes, uncomplicated: Secondary | ICD-10-CM | POA: Insufficient documentation

## 2020-07-07 DIAGNOSIS — J45909 Unspecified asthma, uncomplicated: Secondary | ICD-10-CM | POA: Insufficient documentation

## 2020-07-07 DIAGNOSIS — J449 Chronic obstructive pulmonary disease, unspecified: Secondary | ICD-10-CM | POA: Insufficient documentation

## 2020-07-07 DIAGNOSIS — R1033 Periumbilical pain: Secondary | ICD-10-CM | POA: Insufficient documentation

## 2020-07-07 LAB — I-STAT BETA HCG BLOOD, ED (MC, WL, AP ONLY): I-stat hCG, quantitative: <5 m[IU]/mL (ref <5)

## 2020-07-07 NOTE — ED Triage Notes (Signed)
Pt states she had surgery on abdomen a year ago and started having pain 6 months that has worsened over past 2 months. Pt states she has a knot that feels like it is pushing on her insides. Pt c/o feet swelling as well.

## 2020-07-08 ENCOUNTER — Emergency Department (HOSPITAL_COMMUNITY): Payer: Self-pay

## 2020-07-08 LAB — COMPREHENSIVE METABOLIC PANEL
ALT: 47 U/L — ABNORMAL HIGH (ref 0–44)
AST: 46 U/L — ABNORMAL HIGH (ref 15–41)
Albumin: 3.5 g/dL (ref 3.5–5.0)
Alkaline Phosphatase: 75 U/L (ref 38–126)
Anion gap: 10 (ref 5–15)
BUN: 11 mg/dL (ref 6–20)
CO2: 27 mmol/L (ref 22–32)
Calcium: 9.1 mg/dL (ref 8.9–10.3)
Chloride: 102 mmol/L (ref 98–111)
Creatinine, Ser: 0.72 mg/dL (ref 0.44–1.00)
GFR, Estimated: >60 mL/min (ref >60)
Glucose, Bld: 96 mg/dL (ref 70–99)
Potassium: 3.7 mmol/L (ref 3.5–5.1)
Sodium: 139 mmol/L (ref 135–145)
Total Bilirubin: 0.6 mg/dL (ref 0.3–1.2)
Total Protein: 7.1 g/dL (ref 6.5–8.1)

## 2020-07-08 LAB — URINALYSIS, ROUTINE W REFLEX MICROSCOPIC
Bilirubin Urine: NEGATIVE
Glucose, UA: NEGATIVE mg/dL
Hgb urine dipstick: NEGATIVE
Ketones, ur: NEGATIVE mg/dL
Leukocytes,Ua: NEGATIVE
Nitrite: NEGATIVE
Protein, ur: NEGATIVE mg/dL
Specific Gravity, Urine: 1.028 (ref 1.005–1.030)
pH: 9 — ABNORMAL HIGH (ref 5.0–8.0)

## 2020-07-08 LAB — LIPASE, BLOOD: Lipase: 30 U/L (ref 11–51)

## 2020-07-08 LAB — CBC
HCT: 37.3 % (ref 36.0–46.0)
Hemoglobin: 12.1 g/dL (ref 12.0–15.0)
MCH: 30.3 pg (ref 26.0–34.0)
MCHC: 32.4 g/dL (ref 30.0–36.0)
MCV: 93.5 fL (ref 80.0–100.0)
Platelets: 465 10*3/uL — ABNORMAL HIGH (ref 150–400)
RBC: 3.99 MIL/uL (ref 3.87–5.11)
RDW: 15.4 % (ref 11.5–15.5)
WBC: 9.5 10*3/uL (ref 4.0–10.5)
nRBC: 0 % (ref 0.0–0.2)

## 2020-07-08 MED ORDER — TRAMADOL HCL 50 MG PO TABS: 50.0000 mg | ORAL_TABLET | Freq: Four times a day (QID) | ORAL | 0 refills | Status: DC | PRN

## 2020-07-08 MED ORDER — ONDANSETRON HCL 4 MG/2ML IJ SOLN
4.0000 mg | Freq: Once | INTRAMUSCULAR | Status: AC
  Administered 2020-07-08: 4 mg via INTRAVENOUS
  Filled 2020-07-08: qty 2

## 2020-07-08 MED ORDER — IOHEXOL 300 MG/ML  SOLN
100.0000 mL | Freq: Once | INTRAMUSCULAR | Status: AC | PRN
  Administered 2020-07-08: 100 mL via INTRAVENOUS

## 2020-07-08 MED ORDER — MORPHINE SULFATE (PF) 4 MG/ML IV SOLN
4.0000 mg | Freq: Once | INTRAVENOUS | Status: AC
  Administered 2020-07-08: 4 mg via INTRAVENOUS
  Filled 2020-07-08: qty 1

## 2020-07-08 MED ORDER — SODIUM CHLORIDE 0.9 % IV BOLUS
1000.0000 mL | Freq: Once | INTRAVENOUS | Status: AC
  Administered 2020-07-08: 1000 mL via INTRAVENOUS

## 2020-07-08 NOTE — Discharge Instructions (Signed)
Begin taking tramadol as prescribed.  Follow-up with your general surgeon if pain persists.  Return to the ER in the meantime if you develop worsening pain, intractable vomiting, high fevers, or other new and concerning symptoms.
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
## 2021-03-25 ENCOUNTER — Emergency Department (HOSPITAL_COMMUNITY)
Admission: EM | Admit: 2021-03-25 | Discharge: 2021-03-26 | Disposition: A | Discharge status: Other Acute Inpatient Hospital | Payer: Medicaid Other | Attending: Emergency Medicine | Admitting: Emergency Medicine

## 2021-03-25 ENCOUNTER — Encounter (HOSPITAL_COMMUNITY): Payer: Self-pay

## 2021-03-25 ENCOUNTER — Other Ambulatory Visit: Payer: Self-pay

## 2021-03-25 DIAGNOSIS — Z79899 Other long term (current) drug therapy: Secondary | ICD-10-CM | POA: Insufficient documentation

## 2021-03-25 DIAGNOSIS — J449 Chronic obstructive pulmonary disease, unspecified: Secondary | ICD-10-CM | POA: Insufficient documentation

## 2021-03-25 DIAGNOSIS — F17210 Nicotine dependence, cigarettes, uncomplicated: Secondary | ICD-10-CM | POA: Insufficient documentation

## 2021-03-25 DIAGNOSIS — J45909 Unspecified asthma, uncomplicated: Secondary | ICD-10-CM | POA: Insufficient documentation

## 2021-03-25 DIAGNOSIS — F1994 Other psychoactive substance use, unspecified with psychoactive substance-induced mood disorder: Secondary | ICD-10-CM | POA: Insufficient documentation

## 2021-03-25 DIAGNOSIS — F329 Major depressive disorder, single episode, unspecified: Secondary | ICD-10-CM | POA: Insufficient documentation

## 2021-03-25 DIAGNOSIS — I10 Essential (primary) hypertension: Secondary | ICD-10-CM | POA: Insufficient documentation

## 2021-03-25 DIAGNOSIS — F32A Depression, unspecified: Secondary | ICD-10-CM

## 2021-03-25 DIAGNOSIS — Z20822 Contact with and (suspected) exposure to covid-19: Secondary | ICD-10-CM | POA: Insufficient documentation

## 2021-03-25 LAB — CBC
HCT: 43.2 % (ref 36.0–46.0)
Hemoglobin: 14.6 g/dL (ref 12.0–15.0)
MCH: 32.8 pg (ref 26.0–34.0)
MCHC: 33.8 g/dL (ref 30.0–36.0)
MCV: 97.1 fL (ref 80.0–100.0)
Platelets: 542 10*3/uL — ABNORMAL HIGH (ref 150–400)
RBC: 4.45 MIL/uL (ref 3.87–5.11)
RDW: 13.7 % (ref 11.5–15.5)
WBC: 10.6 10*3/uL — ABNORMAL HIGH (ref 4.0–10.5)
nRBC: 0 % (ref 0.0–0.2)

## 2021-03-25 LAB — COMPREHENSIVE METABOLIC PANEL
ALT: 72 U/L — ABNORMAL HIGH (ref 0–44)
AST: 62 U/L — ABNORMAL HIGH (ref 15–41)
Albumin: 4.6 g/dL (ref 3.5–5.0)
Alkaline Phosphatase: 90 U/L (ref 38–126)
Anion gap: 9 (ref 5–15)
BUN: 23 mg/dL — ABNORMAL HIGH (ref 6–20)
CO2: 24 mmol/L (ref 22–32)
Calcium: 9.3 mg/dL (ref 8.9–10.3)
Chloride: 103 mmol/L (ref 98–111)
Creatinine, Ser: 0.85 mg/dL (ref 0.44–1.00)
GFR, Estimated: >60 mL/min (ref >60)
Glucose, Bld: 111 mg/dL — ABNORMAL HIGH (ref 70–99)
Potassium: 3.3 mmol/L — ABNORMAL LOW (ref 3.5–5.1)
Sodium: 136 mmol/L (ref 135–145)
Total Bilirubin: 0.4 mg/dL (ref 0.3–1.2)
Total Protein: 8.3 g/dL — ABNORMAL HIGH (ref 6.5–8.1)

## 2021-03-25 LAB — ETHANOL: Alcohol, Ethyl (B): <10 mg/dL (ref <10)

## 2021-03-25 LAB — SALICYLATE LEVEL: Salicylate Lvl: <7 mg/dL — ABNORMAL LOW (ref 7.0–30.0)

## 2021-03-25 LAB — ACETAMINOPHEN LEVEL: Acetaminophen (Tylenol), Serum: <10 ug/mL — ABNORMAL LOW (ref 10–30)

## 2021-03-25 MED ORDER — IBUPROFEN 400 MG PO TABS
400.0000 mg | ORAL_TABLET | Freq: Once | ORAL | Status: AC
  Administered 2021-03-25: 19:00:00 400 mg via ORAL
  Filled 2021-03-25: qty 1

## 2021-03-25 NOTE — ED Triage Notes (Signed)
Reports needs to go to a psych hospital.  Reports feels like the whole world is against her and she is sad and doesn't have anything to live for.  Reports her meds are not working.  Denies SI

## 2021-03-25 NOTE — ED Notes (Signed)
Pt refused resp panel

## 2021-03-25 NOTE — ED Notes (Signed)
Pt changed into burgundy scrubs, Pt belongings placed in personal belongings bag with pt name visible and placed in locker room.

## 2021-03-25 NOTE — ED Notes (Signed)
Pt noted to be currently having auditory hallucinations

## 2021-03-25 NOTE — Progress Notes (Addendum)
Update  [facility] is reviewing pt.   Inpatient Behavioral Health Placement  Pt meets inpatient criteria per Nira Conn, NP. There are no appropriate beds at [facility] Per Rosey Bath, RN   Referral was sent to the following facilities;   Destination Service Provider Address Phone Fax Patient Preferred  [facility]  [address], [place] [address] [phone] --  [facility] [address] [facility]  [address] [place] [address] [phone] --  [facility] [address]  [address] [place] [address] [phone] --  [facility]  [address] [place], [place] [address] [phone] --  [facility] [address] [place] [address] [phone] --  [facility]  [address] [place] [address] [phone] --  [facility]  [address] [place] [address] [phone] --  [facility]  [phone] [place] [address] [phone] --  [facility]  [address] [place] [address] [phone] --  [facility]  [address], [place] [address] [phone] --  [facility]  [address] [place] [address] [phone] --  [facility]  [address], [place] [address] [phone] --  [facility]  [address], [place] [address] [phone] --  [facility]  [address] [place] [address] [phone] --  [facility]  [address], [place] [address] [phone] --  [facility]  [address] [place] [address] [phone] --  [facility]  [address] [place], [place] [address] [phone] --  [facility]  [address] [place] [address]       Situation ongoing,  CSW will follow up.   Maryjean Ka, MSW, LCSWA 03/25/2021  @ 10:45 PM

## 2021-03-25 NOTE — ED Notes (Signed)
Informed pt asked about pain medication for headache, pt states "well it is too late now, they are here to get me." When asked who was here to get her pt responds "don't you see them? They are coming to get me. I am leaving." EDP made aware.

## 2021-03-25 NOTE — ED Notes (Signed)
Pt refused to give a urine specimen. She stated that she had given [place] blood and she was not going to give [place] urine also.

## 2021-03-25 NOTE — ED Provider Notes (Signed)
[facility] EMERGENCY DEPARTMENT Provider Note   CSN: [id number] Arrival date & time: 03/25/21  1526     History Chief Complaint  Patient presents with   Psychiatric Evaluation    [name] is a 45 y.o. female.  HPI   Pt has long hx of mental health disorder, she has known bipolar disorder per the medical record, she also has a history of anxiety posttraumatic stress disorder and self admits to substance abuse disorder as well.  She states that she is "uppers".  She states that it is mostly methamphetamine.  She presents today stating for the last several days she has had worsening depression and feelings of emptiness, loneliness and worthlessness.  Though she denies having suicidal thoughts she becomes extremely tearful with any discussion regarding suicide.  She does not want to talk much more and closes up very quickly.  It is unclear exactly how long this has been going on as the patient is not very forthcoming with information, level 5 caveat applies secondary to the patient's mental health disturbance and lack of information  Past Medical History:  Diagnosis Date   Anxiety    Arthritis    Asthma    last flareup was yr or so with bronchitis   Bipolar disorder (HCC)    Carpal tunnel syndrome    COPD (chronic obstructive pulmonary disease) (HCC)    Depressed    Dysrhythmia    'skipped beats every now and then"   Emphysema    does smoke, and has slowed down   Fibromyalgia    Fibromyalgia, primary    GERD (gastroesophageal reflux disease)    periodic indigestion   Hepatitis C    Hypertension    dx a couple of months ago   Kidney stone    Manic depressive disorder (HCC)    Nephrolithiasis    PTSD (post-traumatic stress disorder)    UTI (lower urinary tract infection)     Patient Active Problem List   Diagnosis Date Noted   Substance induced mood disorder (HCC)    PTSD (post-traumatic stress disorder)    Methamphetamine use disorder, severe (HCC)    Splenic  rupture 05/26/2019   Alcohol abuse 11/09/2017   Suicidal thoughts 11/09/2017   Major depressive disorder, recurrent severe without psychotic features (HCC) 11/08/2017   Severe recurrent major depression with psychotic features (HCC) 11/08/2017   Right thigh pain 11/21/2015   Finger injury 11/21/2015   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     Past Surgical History:  Procedure Laterality Date   ABDOMINAL HYSTERECTOMY     ANTERIOR CERVICAL DECOMP/DISCECTOMY FUSION N/A 04/22/2015   Procedure: Cervical 4-5, Cervical 5-6 Anterior Cervical Discectomy and Fusion, Allograft, Plate;  Surgeon: Eldred Manges, MD;  Location: [facility] OR;  Service: Orthopedics;  Laterality: N/A;   CARPAL TUNNEL RELEASE     right hand   ORIF ANKLE FRACTURE  02/03/2012   Procedure: OPEN REDUCTION INTERNAL FIXATION (ORIF) ANKLE FRACTURE;  Surgeon: Nadara Mustard, MD;  Location: [facility] OR;  Service: Orthopedics;  Laterality:  Right;  Open Reduction Internal Fixation Right Ankle   ORIF CLAVICULAR FRACTURE Left 08/29/2012   Dr Ophelia Charter   ORIF CLAVICULAR FRACTURE Left 08/29/2012   Procedure: OPEN REDUCTION INTERNAL FIXATION (ORIF) CLAVICULAR FRACTURE;  Surgeon: Eldred Manges, MD;  Location: [facility] OR;  Service: Orthopedics;  Laterality: Left;  Open Reduction Internal Fixation Left Clavicle Fracture   SPLENECTOMY, 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   Procedure: Splenectomy;  Surgeon: Kinsinger, De Blanch, MD;  Location: [facility] OR;  Service: General;;   TUBAL LIGATION     WISDOM TOOTH EXTRACTION       OB History   No  obstetric history on file.    Obstetric Comments  S/p hysterectomy         Family History  Problem Relation Age of Onset   Alcohol abuse Father    Cirrhosis Father    Cancer Father    Heart attack Mother    Hypertension Mother    Hyperthyroidism Sister    Bipolar disorder Sister    Bipolar disorder Sister    Kidney disease Brother    Bipolar disorder Brother     Social History   Tobacco Use   Smoking status: Every Day    Packs/day: 1.00    Years: 28.00    Pack years: 28.00    Types: Cigarettes    Start date: 03/28/1990   Smokeless tobacco: Never  Vaping Use   Vaping Use: Never used  Substance Use Topics   Alcohol use: Yes    Comment: occ mixed drink or liquor   Drug use: Not Currently    Types: Marijuana    Comment: last marijuana use 05/27/2019    Home Medications Prior to Admission medications   Medication Sig Start Date End Date Taking? Authorizing Provider  gabapentin (NEURONTIN) 300 MG capsule Take 1 capsule (300 mg total) by mouth 3 (three) times daily as needed. 05/30/19  Yes Maczis, Elmer Sow, PA-C  ibuprofen (ADVIL) 200 MG tablet Take 200 mg by mouth every 6 (six) hours as needed.   Yes [provider]  acetaminophen 325 MG tablet Take 2 tablets (650 mg total) by mouth every 6 (six) hours as needed. Patient not taking: Reported on 03/25/2021 05/30/19   Maczis, Elmer Sow, PA-C  traMADol (ULTRAM) 50 MG tablet Take 1 tablet (50 mg total) by mouth every 6 (six) hours as needed. Patient not taking: Reported on 03/25/2021 07/08/20   Geoffery Lyons, MD    Allergies    Sulfa antibiotics, Eggs or egg-derived products, Milk-related compounds, Eggs or egg-derived products, and Milk-related compounds  Review of Systems   Review of Systems  Unable to perform ROS: Psychiatric disorder   Physical Exam Updated Vital Signs BP 120/75 (BP Location: Left Arm)    Pulse 90    Temp 98.9 F (37.2 C) (Oral)    Resp 16    Ht 1.676 m (5\' 6" )    Wt 56.7 kg    SpO2 99%     BMI 20.18 kg/m   Physical Exam Vitals and nursing note reviewed.  Constitutional:      General: She is not in acute distress.    Appearance: She is well-developed.  HENT:     Head: Normocephalic and atraumatic.     Mouth/Throat:     Pharynx: No oropharyngeal exudate.  Eyes:     General: No scleral icterus.       Right eye: No discharge.        Left eye: No discharge.  Conjunctiva/sclera: Conjunctivae normal.     Pupils: Pupils are equal, round, and reactive to light.  Neck:     Thyroid: No thyromegaly.     Vascular: No JVD.  Cardiovascular:     Rate and Rhythm: Regular rhythm. Tachycardia present.     Heart sounds: Normal heart sounds. No murmur heard.   No friction rub. No gallop.  Pulmonary:     Effort: Pulmonary effort is normal. No respiratory distress.     Breath sounds: Normal breath sounds. No wheezing or rales.  Abdominal:     General: Bowel sounds are normal. There is no distension.     Palpations: Abdomen is soft. There is no mass.     Tenderness: There is no abdominal tenderness.  Musculoskeletal:        General: No tenderness. Normal range of motion.     Cervical back: Normal range of motion and neck supple.  Lymphadenopathy:     Cervical: No cervical adenopathy.  Skin:    General: Skin is warm and dry.     Findings: No erythema or rash.  Neurological:     Mental Status: She is alert.     Coordination: Coordination normal.  Psychiatric:     Comments: Affect is tearful, judgment is impaired, insight is poor, the patient is making poor eye contact, she mumbles under her breath, she denies having active hallucinations, she denies active suicidality    ED Results / Procedures / Treatments   Labs (all labs ordered are listed, but only abnormal results are displayed) Labs Reviewed  COMPREHENSIVE METABOLIC PANEL - Abnormal; Notable for the following components:      Result Value   Potassium 3.3 (*)    Glucose, Bld 111 (*)    BUN 23 (*)    Total Protein  8.3 (*)    AST 62 (*)    ALT 72 (*)    All other components within normal limits  SALICYLATE LEVEL - Abnormal; Notable for the following components:   Salicylate Lvl <7.0 (*)    All other components within normal limits  ACETAMINOPHEN LEVEL - Abnormal; Notable for the following components:   Acetaminophen (Tylenol), Serum <10 (*)    All other components within normal limits  CBC - Abnormal; Notable for the following components:   WBC 10.6 (*)    Platelets 542 (*)    All other components within normal limits  RAPID URINE DRUG SCREEN, HOSP PERFORMED - Abnormal; Notable for the following components:   Cocaine POSITIVE (*)    Amphetamines POSITIVE (*)    Tetrahydrocannabinol POSITIVE (*)    All other components within normal limits  URINALYSIS, ROUTINE W REFLEX MICROSCOPIC - Abnormal; Notable for the following components:   Color, Urine AMBER (*)    APPearance TURBID (*)    Ketones, ur 20 (*)    Bacteria, UA RARE (*)    All other components within normal limits  RESP PANEL BY RT-PCR (FLU A&B, COVID) ARPGX2  ETHANOL  PREGNANCY, URINE  POC URINE PREG, ED    EKG None  Radiology No results found.  Procedures Procedures   Medications Ordered in ED Medications  ibuprofen (ADVIL) tablet 400 mg (400 mg Oral Given 03/25/21 1833)    ED Course  I have reviewed the triage vital signs and the nursing notes.  Pertinent labs & imaging results that were available during my care of the patient were reviewed by me and considered in my medical decision making (see chart for details).  MDM Rules/Calculators/A&P                          Certainly this patient appears to be decompensated with regards to her mental health, whether this is drug-induced or related to worsening depression and situational homelessness it is unclear.  She has not been admitted to a psychiatric hospital as far as we can tell in her medical record for quite some time.  We will consult with psychiatry, labs to  clear the patient medically  Labs reassuring, patient has been cleared for transfer, vital signs remained stable.  She is definitely not stable for discharge into the community and thankfully has been accepted at a psychiatric institution.     Final Clinical Impression(s) / ED Diagnoses Final diagnoses:  Depression, unspecified depression type     Eber Hong, MD 03/26/21 [phone]
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
## 2021-03-25 NOTE — ED Notes (Signed)
IVC paperwork faxed to [facility] @  Hospital

## 2021-03-26 LAB — URINALYSIS, ROUTINE W REFLEX MICROSCOPIC
Bilirubin Urine: NEGATIVE
Glucose, UA: NEGATIVE mg/dL
Hgb urine dipstick: NEGATIVE
Ketones, ur: 20 mg/dL — AB
Leukocytes,Ua: NEGATIVE
Nitrite: NEGATIVE
Protein, ur: NEGATIVE mg/dL
Specific Gravity, Urine: 1.023 (ref 1.005–1.030)
pH: 5 (ref 5.0–8.0)

## 2021-03-26 LAB — PREGNANCY, URINE: Preg Test, Ur: NEGATIVE

## 2021-03-26 LAB — RAPID URINE DRUG SCREEN, HOSP PERFORMED
Amphetamines: POSITIVE — AB
Barbiturates: NOT DETECTED
Benzodiazepines: NOT DETECTED
Cocaine: POSITIVE — AB
Opiates: NOT DETECTED
Tetrahydrocannabinol: POSITIVE — AB

## 2021-03-26 LAB — RESP PANEL BY RT-PCR (FLU A&B, COVID) ARPGX2
Influenza A by PCR: NEGATIVE
Influenza B by PCR: NEGATIVE
SARS Coronavirus 2 by RT PCR: NEGATIVE

## 2021-03-26 NOTE — ED Provider Notes (Signed)
Received at shift change from Dr. Hyacinth Meeker please see his note for full detail  In short patient presents with worsening depression as well as polysubstance abuse, known history of bipolar, anxiety, posttraumatic stress syndrome.  Patient states she has no complaints this time, would like to be treated for her mental health. Physical Exam  BP 120/75 (BP Location: Left Arm)    Pulse 90    Temp 98.9 F (37.2 C) (Oral)    Resp 16    Ht 5\' 6"  (1.676 m)    Wt 56.7 kg    SpO2 99%    BMI 20.18 kg/m   Physical Exam Vitals and nursing note reviewed.  Constitutional:      General: She is not in acute distress.    Appearance: Normal appearance. She is not ill-appearing or diaphoretic.  HENT:     Head: Normocephalic and atraumatic.     Nose: No congestion or rhinorrhea.  Eyes:     Conjunctiva/sclera: Conjunctivae normal.  Pulmonary:     Effort: Pulmonary effort is normal.  Musculoskeletal:     Cervical back: Neck supple.     Right lower leg: No edema.     Left lower leg: No edema.  Skin:    General: Skin is warm and dry.     Coloration: Skin is not jaundiced or pale.  Neurological:     Mental Status: She is alert and oriented to person, place, and time.  Psychiatric:        Mood and Affect: Mood normal.    ED Course/Procedures     Procedures  MDM  Initial impression-presents with worsening depression.  Alert, no acute stress, vital signs reassuring.  Med clearance work-up was obtained  Work-up-CBC shows slight leukocytosis 10.6, CMP shows potassium 3.3 glucose 111, BUN of 23, slightly elevated liver enzymes, acetaminophen less than 10, Saadat level less than 7 ethanol less than 10, UA shows white blood cells rare bacteria, rapid urine drug screen shows cocaine amphetamines tetra cannabinoids.  Urine pregnancy negative  Reassessment-reassessed has no complaints at this time, patient agreeable for transfer.  Consult Per , NP recommends inpatient treatment.  Patient has been  accepted at a psychiatric  facility and will be transferred there today.  Rule out-low suspicion for ssystemic infection patient nontoxic-appearing, vital signs reassuring.  Low suspicion for metabolic abnormality  as CMP is negative for acute findings.  Low Suspicion for CVA or intracranial head bleed is no focal deficit present my exam, presentation atypical etiology.  Plan-patient is medically clear at this time she is under IVC and she will proceed to inpatient psychiatric facility for further treatment.       Arvil Persons, PA-C 03/26/21 1422    03/28/21, MD 03/26/21 2038

## 2021-03-26 NOTE — ED Notes (Signed)
Pt refused COVID test, PD at bedside to discuss the reasoning and explain the IVC order.  Pt tearful and does not understand the process.  Staff and PD explained process.  Pt states to this nurse "why do you have to be so mean?"

## 2021-03-26 NOTE — ED Notes (Signed)
Pt calm and cooperative watching TV in her room at this time.

## 2021-03-26 NOTE — ED Notes (Signed)
Requested UA from pt for urinalysis

## 2021-03-26 NOTE — ED Notes (Addendum)
RN spoke with Kem Kays [phone])   at [facility] [address] [place], [place] [address]. Pt has been accepted and accepting provider is Durwin Nora, MD. [facility] contacted to provide transport for Pt.

## 2021-03-26 NOTE — ED Notes (Signed)
Information sent to facility

## 2021-03-26 NOTE — Progress Notes (Addendum)
Pt accepted to [facility]   CSW received phone call back from Kem Kays with [facility] reporting pt has been accepted. Accepting physician is Dr. Lorelee Cover.Bed Assignment-[place] unit. Phone number for report [phone].  CSW confirmed with nursing, Luana Shu, RN that pt has been transported to the facility.  Maryjean Ka, MSW, LCSWA 03/26/2021 9:30 PM

## 2021-03-26 NOTE — Progress Notes (Signed)
Patient has been faxed out due to no beds available at [facility]. Patient meets BH inpatient criteria per Nira Conn, NP. Patient has been faxed out to the following facilities:    [facility]  [address] [place] [address] [phone]  [facility]  [address] [place] [address] [phone]  [facility] [address]  [address], [place] [address] [phone]  [facility]  [address] [place], [place] [address] [phone]  [facility] [place] [address] [phone]  [facility]  [address], [place] [address] [phone]  [facility]  [address] [place] [address] [phone]  [facility]  [address] [place] [address] [phone]  [facility]  [address] [place] [address] [phone]  [facility]  [address] [place] [address] [phone]  [facility]  [address], [place] [address] [phone]  [facility]  [address], [place] [address] [phone]  [facility]  [address], [place] [address] [phone]  [facility]  [address], [place] [address] [phone]  [facility]  [address], [place] [address] [phone]  [facility]  [address], [place] [address] [phone]  [facility]  [address] [place], [place] [address] [phone]  [facility]  [address] [place] [address] [phone]   Damita Dunnings, MSW, LCSW-A  9:46 AM 03/26/2021

## 2021-04-21 ENCOUNTER — Emergency Department (HOSPITAL_COMMUNITY): Payer: Self-pay

## 2021-04-21 ENCOUNTER — Encounter (HOSPITAL_COMMUNITY): Payer: Self-pay | Admitting: Emergency Medicine

## 2021-04-21 ENCOUNTER — Other Ambulatory Visit: Payer: Self-pay

## 2021-04-21 ENCOUNTER — Emergency Department (HOSPITAL_COMMUNITY)
Admission: EM | Admit: 2021-04-21 | Discharge: 2021-04-22 | Disposition: A | Discharge status: Home / Self Care | Payer: Self-pay | Attending: Emergency Medicine | Admitting: Emergency Medicine

## 2021-04-21 DIAGNOSIS — F1910 Other psychoactive substance abuse, uncomplicated: Secondary | ICD-10-CM | POA: Insufficient documentation

## 2021-04-21 DIAGNOSIS — Z5900 Homelessness unspecified: Secondary | ICD-10-CM | POA: Insufficient documentation

## 2021-04-21 DIAGNOSIS — M25562 Pain in left knee: Secondary | ICD-10-CM | POA: Insufficient documentation

## 2021-04-21 DIAGNOSIS — F1520 Other stimulant dependence, uncomplicated: Secondary | ICD-10-CM | POA: Insufficient documentation

## 2021-04-21 DIAGNOSIS — F99 Mental disorder, not otherwise specified: Secondary | ICD-10-CM | POA: Insufficient documentation

## 2021-04-21 DIAGNOSIS — F323 Major depressive disorder, single episode, severe with psychotic features: Secondary | ICD-10-CM | POA: Insufficient documentation

## 2021-04-21 DIAGNOSIS — F1994 Other psychoactive substance use, unspecified with psychoactive substance-induced mood disorder: Secondary | ICD-10-CM | POA: Diagnosis present

## 2021-04-21 DIAGNOSIS — Y900 Blood alcohol level of less than 20 mg/100 ml: Secondary | ICD-10-CM | POA: Insufficient documentation

## 2021-04-21 DIAGNOSIS — Z20822 Contact with and (suspected) exposure to covid-19: Secondary | ICD-10-CM | POA: Insufficient documentation

## 2021-04-21 DIAGNOSIS — R45851 Suicidal ideations: Secondary | ICD-10-CM | POA: Insufficient documentation

## 2021-04-21 LAB — CBC WITH DIFFERENTIAL/PLATELET
Abs Immature Granulocytes: 0.02 10*3/uL (ref 0.00–0.07)
Basophils Absolute: 0.1 10*3/uL (ref 0.0–0.1)
Basophils Relative: 0 %
Eosinophils Absolute: 0.1 10*3/uL (ref 0.0–0.5)
Eosinophils Relative: 1 %
HCT: 36.2 % (ref 36.0–46.0)
Hemoglobin: 12.4 g/dL (ref 12.0–15.0)
Immature Granulocytes: 0 %
Lymphocytes Relative: 39 %
Lymphs Abs: 4.4 10*3/uL — ABNORMAL HIGH (ref 0.7–4.0)
MCH: 31.4 pg (ref 26.0–34.0)
MCHC: 34.3 g/dL (ref 30.0–36.0)
MCV: 91.6 fL (ref 80.0–100.0)
Monocytes Absolute: 0.9 10*3/uL (ref 0.1–1.0)
Monocytes Relative: 8 %
Neutro Abs: 5.8 10*3/uL (ref 1.7–7.7)
Neutrophils Relative %: 52 %
Platelets: 472 10*3/uL — ABNORMAL HIGH (ref 150–400)
RBC: 3.95 MIL/uL (ref 3.87–5.11)
RDW: 13.4 % (ref 11.5–15.5)
WBC: 11.3 10*3/uL — ABNORMAL HIGH (ref 4.0–10.5)
nRBC: 0 % (ref 0.0–0.2)

## 2021-04-21 LAB — RAPID URINE DRUG SCREEN, HOSP PERFORMED
Amphetamines: POSITIVE — AB
Barbiturates: NOT DETECTED
Benzodiazepines: POSITIVE — AB
Cocaine: NOT DETECTED
Opiates: NOT DETECTED
Tetrahydrocannabinol: NOT DETECTED

## 2021-04-21 LAB — COMPREHENSIVE METABOLIC PANEL
ALT: 58 U/L — ABNORMAL HIGH (ref 0–44)
AST: 76 U/L — ABNORMAL HIGH (ref 15–41)
Albumin: 4.4 g/dL (ref 3.5–5.0)
Alkaline Phosphatase: 81 U/L (ref 38–126)
Anion gap: 9 (ref 5–15)
BUN: 33 mg/dL — ABNORMAL HIGH (ref 6–20)
CO2: 24 mmol/L (ref 22–32)
Calcium: 10.4 mg/dL — ABNORMAL HIGH (ref 8.9–10.3)
Chloride: 105 mmol/L (ref 98–111)
Creatinine, Ser: 1.08 mg/dL — ABNORMAL HIGH (ref 0.44–1.00)
GFR, Estimated: >60 mL/min (ref >60)
Glucose, Bld: 103 mg/dL — ABNORMAL HIGH (ref 70–99)
Potassium: 4.2 mmol/L (ref 3.5–5.1)
Sodium: 138 mmol/L (ref 135–145)
Total Bilirubin: 1.1 mg/dL (ref 0.3–1.2)
Total Protein: 7.7 g/dL (ref 6.5–8.1)

## 2021-04-21 LAB — RESP PANEL BY RT-PCR (FLU A&B, COVID) ARPGX2
Influenza A by PCR: NEGATIVE
Influenza B by PCR: NEGATIVE
SARS Coronavirus 2 by RT PCR: NEGATIVE

## 2021-04-21 LAB — ETHANOL: Alcohol, Ethyl (B): <10 mg/dL (ref <10)

## 2021-04-21 MED ORDER — LORAZEPAM 2 MG/ML IJ SOLN
1.0000 mg | Freq: Once | INTRAMUSCULAR | Status: AC
  Administered 2021-04-21: 19:00:00 1 mg via INTRAMUSCULAR
  Filled 2021-04-21: qty 1

## 2021-04-21 MED ORDER — LORAZEPAM 1 MG PO TABS
2.0000 mg | ORAL_TABLET | Freq: Once | ORAL | Status: AC
  Administered 2021-04-21: 13:00:00 2 mg via ORAL
  Filled 2021-04-21: qty 2

## 2021-04-21 NOTE — ED Notes (Signed)
Patient changed into burgundy scrubs. Pt wanded by security. 1 pt bag in pt belongings cabinet in triage.

## 2021-04-21 NOTE — ED Notes (Signed)
Attempted to dress pt into burgundy scrubs. Pt refused until she sees paperwork saying she is not IVC or suicidal.
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
## 2021-04-21 NOTE — BH Assessment (Signed)
TTS is ready to assess pt.

## 2021-04-21 NOTE — ED Notes (Signed)
Pt is having auditory and visual hallucinations. Ativan given to pt at this time.

## 2021-04-21 NOTE — ED Notes (Signed)
Patient restless.   Hallucinating.  Reassured and redirected.
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
## 2021-04-21 NOTE — BH Assessment (Signed)
Per RN, pt is sleep. TTS will assess at a later time

## 2021-04-22 ENCOUNTER — Ambulatory Visit (HOSPITAL_COMMUNITY)
Admission: RE | Admit: 2021-04-22 | Discharge: 2021-04-22 | Disposition: A | Discharge status: Home / Self Care | Payer: No Payment, Other | Attending: Psychiatry | Admitting: Psychiatry

## 2021-04-22 ENCOUNTER — Other Ambulatory Visit (HOSPITAL_COMMUNITY)
Admission: EM | Admit: 2021-04-22 | Discharge: 2021-04-28 | Disposition: A | Discharge status: Other Acute Inpatient Hospital | Payer: No Payment, Other | Attending: Psychiatry | Admitting: Psychiatry

## 2021-04-22 DIAGNOSIS — Z5900 Homelessness unspecified: Secondary | ICD-10-CM

## 2021-04-22 DIAGNOSIS — F333 Major depressive disorder, recurrent, severe with psychotic symptoms: Secondary | ICD-10-CM

## 2021-04-22 DIAGNOSIS — F1510 Other stimulant abuse, uncomplicated: Secondary | ICD-10-CM | POA: Insufficient documentation

## 2021-04-22 DIAGNOSIS — F19951 Other psychoactive substance use, unspecified with psychoactive substance-induced psychotic disorder with hallucinations: Secondary | ICD-10-CM | POA: Insufficient documentation

## 2021-04-22 DIAGNOSIS — Z1330 Encounter for screening examination for mental health and behavioral disorders, unspecified: Secondary | ICD-10-CM

## 2021-04-22 DIAGNOSIS — F4310 Post-traumatic stress disorder, unspecified: Secondary | ICD-10-CM | POA: Insufficient documentation

## 2021-04-22 DIAGNOSIS — Z79899 Other long term (current) drug therapy: Secondary | ICD-10-CM | POA: Insufficient documentation

## 2021-04-22 DIAGNOSIS — F1994 Other psychoactive substance use, unspecified with psychoactive substance-induced mood disorder: Secondary | ICD-10-CM | POA: Diagnosis present

## 2021-04-22 DIAGNOSIS — Z20822 Contact with and (suspected) exposure to covid-19: Secondary | ICD-10-CM | POA: Insufficient documentation

## 2021-04-22 LAB — POC URINE PREG, ED: Preg Test, Ur: NEGATIVE

## 2021-04-22 LAB — POCT PREGNANCY, URINE: Preg Test, Ur: NEGATIVE

## 2021-04-22 LAB — POC SARS CORONAVIRUS 2 AG -  ED: SARS Coronavirus 2 Ag: NEGATIVE

## 2021-04-22 LAB — POCT URINE DRUG SCREEN - MANUAL ENTRY (I-SCREEN)
POC Amphetamine UR: POSITIVE — AB
POC Buprenorphine (BUP): NOT DETECTED
POC Cocaine UR: NOT DETECTED
POC Marijuana UR: NOT DETECTED
POC Methadone UR: NOT DETECTED
POC Methamphetamine UR: POSITIVE — AB
POC Morphine: NOT DETECTED
POC Oxazepam (BZO): POSITIVE — AB
POC Oxycodone UR: NOT DETECTED
POC Secobarbital (BAR): NOT DETECTED

## 2021-04-22 LAB — POC SARS CORONAVIRUS 2 AG: SARSCOV2ONAVIRUS 2 AG: NEGATIVE

## 2021-04-22 MED ORDER — ALUM & MAG HYDROXIDE-SIMETH 200-200-20 MG/5ML PO SUSP: 30.0000 mL | ORAL | Status: DC | PRN

## 2021-04-22 MED ORDER — GABAPENTIN 300 MG PO CAPS
300.0000 mg | ORAL_CAPSULE | Freq: Three times a day (TID) | ORAL | Status: DC
  Administered 2021-04-22 – 2021-04-28 (×17): 300 mg via ORAL
  Filled 2021-04-22 (×5): qty 1
  Filled 2021-04-22: qty 21
  Filled 2021-04-22 (×12): qty 1

## 2021-04-22 MED ORDER — ESCITALOPRAM OXALATE 10 MG PO TABS
10.0000 mg | ORAL_TABLET | Freq: Every day | ORAL | Status: DC
  Administered 2021-04-22 – 2021-04-26 (×5): 10 mg via ORAL
  Filled 2021-04-22 (×5): qty 1

## 2021-04-22 MED ORDER — OLANZAPINE 5 MG PO TABS
5.0000 mg | ORAL_TABLET | Freq: Every day | ORAL | Status: DC
  Administered 2021-04-22 – 2021-04-26 (×5): 5 mg via ORAL
  Filled 2021-04-22 (×4): qty 1
  Filled 2021-04-22: qty 7
  Filled 2021-04-22: qty 1

## 2021-04-22 MED ORDER — TRAZODONE HCL 50 MG PO TABS: 50.0000 mg | ORAL_TABLET | Freq: Every evening | ORAL | Status: DC | PRN

## 2021-04-22 MED ORDER — ZIPRASIDONE MESYLATE 20 MG IM SOLR
10.0000 mg | Freq: Once | INTRAMUSCULAR | Status: AC
  Administered 2021-04-22: 10 mg via INTRAMUSCULAR
  Filled 2021-04-22: qty 20

## 2021-04-22 MED ORDER — ACETAMINOPHEN 325 MG PO TABS
650.0000 mg | ORAL_TABLET | Freq: Four times a day (QID) | ORAL | Status: DC | PRN
  Administered 2021-04-26: 650 mg via ORAL
  Filled 2021-04-22: qty 2

## 2021-04-22 MED ORDER — MAGNESIUM HYDROXIDE 400 MG/5ML PO SUSP: 30.0000 mL | Freq: Every day | ORAL | Status: DC | PRN

## 2021-04-22 MED ORDER — STERILE WATER FOR INJECTION IJ SOLN
INTRAMUSCULAR | Status: AC
  Administered 2021-04-22: 10 mL
  Filled 2021-04-22: qty 10

## 2021-04-22 MED ORDER — HYDROXYZINE HCL 25 MG PO TABS
25.0000 mg | ORAL_TABLET | Freq: Three times a day (TID) | ORAL | Status: DC | PRN
  Administered 2021-04-24 – 2021-04-28 (×5): 25 mg via ORAL
  Filled 2021-04-22 (×3): qty 1
  Filled 2021-04-22: qty 10
  Filled 2021-04-22 (×2): qty 1

## 2021-04-22 MED ORDER — ACETAMINOPHEN 325 MG PO TABS: 650.0000 mg | ORAL_TABLET | Freq: Four times a day (QID) | ORAL | Status: DC | PRN

## 2021-04-22 NOTE — Progress Notes (Signed)
TOC CSW spoke with pt and informed her about [facility] and [facility] tonight. Shelter list have been attached to pt's AVS. Pt provided with bus pass. TOC sign off Valentina Shaggy.Denisha Hoel, MSW, LCSWA [facility]   Transitions of Care Clinical Social Worker I Direct Dial: [phone]   Fax: [phone] [email or web address]

## 2021-04-22 NOTE — BH Assessment (Addendum)
[facility] Assessment Progress Note   Per Elta Guadeloupe, NP, this pt does not require psychiatric hospitalization at this time.  Pt presents under IVC initiated by law enforcement and upheld by EDP Norman Clay, MD, which has been rescinded by Nelly Rout, MD.  Pt is psychiatrically cleared.  Discharge instructions advise pt to follow up with [facility].  A TOC consult has been ordered to address pt's psychosocial needs.  EDP Linwood Dibbles, MD and pt's nurse, Waynetta Sandy, have been notified.  Doylene Canning, MA Triage Specialist [phone]

## 2021-04-22 NOTE — ED Notes (Signed)
After being medicated per order with geodon [name] slept the remainder of the night

## 2021-04-22 NOTE — ED Notes (Signed)
Patient DC d off unit to home per provider. Patient alert. Patient anxious. Patient given DC information and resources. Patient ambulatory off unit, escorted by nurse. Patient given bus pass for transport.

## 2021-04-22 NOTE — BH Assessment (Signed)
[name] dc from [place] today. Reports passive SI, AH, presenting disorganized, stating she need help with her mental health. Pt is routine.

## 2021-04-22 NOTE — ED Notes (Addendum)
error 

## 2021-04-22 NOTE — ED Notes (Signed)
Pt repeatedly attempting to get oob, pt gait is very unsteady sitter at bedside. [name] is attempting to undress herself states she is going to church and is putting on her church clothes. Dr. Eudelia Bunch contacted and orders obtained

## 2021-04-22 NOTE — ED Notes (Signed)
Frequent attempts get OOB requiring multiple redirections from staff and multiple assist back in bed d/t unsteady gait.

## 2021-04-22 NOTE — ED Notes (Signed)
Sleeping geodon effective for anxiety

## 2021-04-22 NOTE — ED Provider Notes (Addendum)
Emergency Medicine Observation Re-evaluation Note  [name] is a 46 y.o. female, seen on rounds today.  Pt initially presented to the ED for complaints of Medical Clearance and Psychiatric Evaluation Currently, the patient is resting.  Physical Exam  BP 111/81 (BP Location: Left Arm)    Pulse 71    Temp (!) 97.3 F (36.3 C) (Oral)    Resp 18    Ht 1.676 m (5\' 6" )    Wt 63.5 kg    SpO2 100%    BMI 22.60 kg/m  Physical Exam General: Sleeping Cardiac: Regular rate Lungs: Breathing easily, normal respiratory effort Psych: Somnolent  ED Course / MDM  EKG:EKG Interpretation  Date/Time:  Wednesday April 21 2021 10:07:21 EST Ventricular Rate:  108 PR Interval:  129 QRS Duration: 88 QT Interval:  327 QTC Calculation: 439 R Axis:   80 Text Interpretation: Sinus tachycardia Right atrial enlargement Consider left ventricular hypertrophy ST depr, consider ischemia, inferior leads Baseline wander in lead(s) V3 V4 Confirmed by Thamas Jaegers ([id number]) on 04/21/2021 12:18:59 PM  I have reviewed the labs performed to date as well as medications administered while in observation.  Recent changes in the last 24 hours include evaluation by psychiatry.  Inpatient treatment recommended.  Plan  Current plan is for inpatient treatment.  Patient is medically cleared for psychiatric treatment. [name] is under involuntary commitment.      Dorie Rank, MD 04/22/21 [phone] Patient has been cleared by psychiatry.  She is stable for discharge   Dorie Rank, MD 04/22/21 1259
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
## 2021-04-22 NOTE — Consult Note (Signed)
[facility] Face-to-Face Psychiatry Consult   Reason for Consult:  hallucinations and IVC Referring Physician:  Norman Clay, MD Patient Identification: [name] MRN:  [id number] Principal Diagnosis: Substance induced mood disorder (HCC) Diagnosis:  Principal Problem:   Substance induced mood disorder (HCC) Active Problems:   Methamphetamine use disorder, severe (HCC)   Homelessness   Total Time spent with patient: 30 minutes  Subjective:   Patient was seen, chart reviewed and case discussed with Dr Lucianne Muss. [name] is a 46 y.o. female patient admitted with after being found in a parking lot by police, hallucinating. Patient is known to [facility] emergency rooms. She was last seen at [facility] on 12/30 and then admitted to [facility] for psychiatric care. Patient is a poor historian and can not say when she was discharged from the hospital and stated they did not have her on any medications while she was there. Patient stated her daughter handed her some money yesterday and it had marijuana on it, that is what caused her to "have a mental breakdown" in the parking lot.  Patient's UDS was positive for amphetamines and benzodiazepines (hospital give). Patient has a history of methamphetamine use. When told the result of her drug screen she stated "my whole family is on Adderall and I get it from them."  She is irritable with answering questions. She denies SI/HI/AVH, paranoia and delusions. When asked what psychiatry can do to best help her she answered "I don't know, I am homeless and have nowhere to go." Patient is agreeable for [facility] to speak with her about her options for shelters in the area. Patient is psychiatrically clear.     Past Psychiatric History: MDD, recurrent severe without psychosis, Substance induced mood disorder, homelessness,   Risk to Self:  No  Risk to Others:  No Prior Inpatient Therapy:  Yes Prior Outpatient Therapy:  Unknown  Past Medical History:  Past  Medical History:  Diagnosis Date   Anxiety    Arthritis    Asthma    last flareup was yr or so with bronchitis   Bipolar disorder (HCC)    Carpal tunnel syndrome    COPD (chronic obstructive pulmonary disease) (HCC)    Depressed    Dysrhythmia    'skipped beats every now and then"   Emphysema    does smoke, and has slowed down   Fibromyalgia    Fibromyalgia, primary    GERD (gastroesophageal reflux disease)    periodic indigestion   Hepatitis C    Hypertension    dx a couple of months ago   Kidney stone    Manic depressive disorder (HCC)    Nephrolithiasis    PTSD (post-traumatic stress disorder)    UTI (lower urinary tract infection)     Past Surgical History:  Procedure Laterality Date   ABDOMINAL HYSTERECTOMY     ANTERIOR CERVICAL DECOMP/DISCECTOMY FUSION N/A 04/22/2015   Procedure: Cervical 4-5, Cervical 5-6 Anterior Cervical Discectomy and Fusion, Allograft, Plate;  Surgeon: Eldred Manges, MD;  Location: [facility] OR;  Service: Orthopedics;  Laterality: N/A;   CARPAL TUNNEL RELEASE     right hand   ORIF ANKLE FRACTURE  02/03/2012   Procedure: OPEN REDUCTION INTERNAL FIXATION (ORIF) ANKLE FRACTURE;  Surgeon: Nadara Mustard, MD;  Location: [facility] OR;  Service: Orthopedics;  Laterality: Right;  Open Reduction Internal Fixation Right Ankle   ORIF CLAVICULAR FRACTURE Left 08/29/2012   Dr Ophelia Charter   ORIF CLAVICULAR FRACTURE Left 08/29/2012  Procedure: OPEN REDUCTION INTERNAL FIXATION (ORIF) CLAVICULAR FRACTURE;  Surgeon: Eldred MangesMark C Yates, MD;  Location: [facility] OR;  Service: Orthopedics;  Laterality: Left;  Open Reduction Internal Fixation Left Clavicle Fracture   SPLENECTOMY, 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   Procedure: Splenectomy;  Surgeon: Kinsinger, De BlanchLuke Aaron, MD;  [facility];  Service: General;;   TUBAL LIGATION     WISDOM TOOTH EXTRACTION     Family History:  Family History  Problem Relation Age of Onset   Alcohol abuse Father    Cirrhosis Father    Cancer Father    Heart attack Mother     Hypertension Mother    Hyperthyroidism Sister    Bipolar disorder Sister    Bipolar disorder Sister    Kidney disease Brother    Bipolar disorder Brother    Family Psychiatric  History: Unknown Social History:  Social History   Substance and Sexual Activity  Alcohol Use Yes   Comment: occ mixed drink or liquor     Social History   Substance and Sexual Activity  Drug Use Not Currently   Types: Marijuana   Comment: last marijuana use 05/27/2019    Social History   Socioeconomic History   Marital status: Single    Spouse name: Not on file   Number of children: 4   Years of education: 8th   Highest education level: Not on file  Occupational History    Employer: [employer]    Comment: [employer]  Tobacco Use   Smoking status: Every Day    Packs/day: 1.00    Years: 28.00    Pack years: 28.00    Types: Cigarettes    Start date: 03/28/1990   Smokeless tobacco: Never  Vaping Use   Vaping Use: Never used  Substance and Sexual Activity   Alcohol use: Yes    Comment: occ mixed drink or liquor   Drug use: Not Currently    Types: Marijuana    Comment: last marijuana use 05/27/2019   Sexual activity: Not on file  Other Topics Concern   Not on file  Social History Narrative   ** Merged History Encounter **       As of 02/20/12:  Completed some high school. Has a fiance Conley Rolls([place]) who brings her to appointments. Unemployed. No regular exercise. Feels safe in her relationships  Has smoked cigarettes since she was 46 years old, currently doing 1/2 to 1 pack    every two days. Denies recreational drug use currently or a history of ever using IV drugs. Occasionally drinks a couple drinks about twice per week. There was a time where she drank 2-3 drinks either every day or every other day for about three years du   ring 2009-2012.  Note: after reviewing prior records, patient has had multiple admissions to behavioral health, several of which mention alcohol abuse, and  at least one of which was for alcohol detoxification. Patient did not share this information duri   ng our visit.  Has four children (ages 3820, 4318, [address]15, 6113). The 45 year old lives [place] sister [place] so that they can continue to go to school at the same place. The 45 year old recently moved in with her two months ago, but had previously    been living with pt's sister as well (moved in as there was conflict between child & pt's sister). The other two kids are adults and live on their own.  Caffeine Use: 1-2 cups daily  Social Determinants of Health   Financial Resource Strain: Not on file  Food Insecurity: Not on file  Transportation Needs: Not on file  Physical Activity: Not on file  Stress: Not on file  Social Connections: Not on file   Additional Social History:    Allergies:   Allergies  Allergen Reactions   Sulfa Antibiotics Anaphylaxis and Rash   Eggs Or Egg-Derived Products Hives   Milk-Related Compounds Hives   Eggs Or Egg-Derived Products Hives   Milk-Related Compounds Hives    Labs:  Results for orders placed or performed during the hospital encounter of 04/21/21 (from the past 48 hour(s))  Comprehensive metabolic panel     Status: Abnormal   Collection Time: 04/21/21 10:50 AM  Result Value Ref Range   Sodium 138 135 - 145 mmol/L   Potassium 4.2 3.5 - 5.1 mmol/L   Chloride 105 98 - 111 mmol/L   CO2 24 22 - 32 mmol/L   Glucose, Bld 103 (H) 70 - 99 mg/dL    Comment: Glucose reference range applies only to samples taken after fasting for at least 8 hours.   BUN 33 (H) 6 - 20 mg/dL   Creatinine, Ser 8.11 (H) 0.44 - 1.00 mg/dL   Calcium 91.4 (H) 8.9 - 10.3 mg/dL   Total Protein 7.7 6.5 - 8.1 g/dL   Albumin 4.4 3.5 - 5.0 g/dL   AST 76 (H) 15 - 41 U/L   ALT 58 (H) 0 - 44 U/L   Alkaline Phosphatase 81 38 - 126 U/L   Total Bilirubin 1.1 0.3 - 1.2 mg/dL   GFR, Estimated >78 >29 mL/min    Comment: (NOTE) Calculated using the CKD-EPI Creatinine  Equation (2021)    Anion gap 9 5 - 15    Comment: Performed at [facility], [address], [place], [place] [address]  Ethanol     Status: None   Collection Time: 04/21/21 10:50 AM  Result Value Ref Range   Alcohol, Ethyl (B) <10 <10 mg/dL    Comment: (NOTE) Lowest detectable limit for serum alcohol is 10 mg/dL.  For medical purposes only. Performed at [facility], [address] [place], [place] [address]   CBC with Diff     Status: Abnormal   Collection Time: 04/21/21 10:50 AM  Result Value Ref Range   WBC 11.3 (H) 4.0 - 10.5 K/uL   RBC 3.95 3.87 - 5.11 MIL/uL   Hemoglobin 12.4 12.0 - 15.0 g/dL   HCT 84.6 96.2 - 95.2 %   MCV 91.6 80.0 - 100.0 fL   MCH 31.4 26.0 - 34.0 pg   MCHC 34.3 30.0 - 36.0 g/dL   RDW 84.1 32.4 - 40.1 %   Platelets 472 (H) 150 - 400 K/uL   nRBC 0.0 0.0 - 0.2 %   Neutrophils Relative % 52 %   Neutro Abs 5.8 1.7 - 7.7 K/uL   Lymphocytes Relative 39 %   Lymphs Abs 4.4 (H) 0.7 - 4.0 K/uL   Monocytes Relative 8 %   Monocytes Absolute 0.9 0.1 - 1.0 K/uL   Eosinophils Relative 1 %   Eosinophils Absolute 0.1 0.0 - 0.5 K/uL   Basophils Relative 0 %   Basophils Absolute 0.1 0.0 - 0.1 K/uL   Immature Granulocytes 0 %   Abs Immature Granulocytes 0.02 0.00 - 0.07 K/uL   Reactive, Benign Lymphocytes PRESENT     Comment: Performed at [facility], [address] [place], [place]  [id number]  Resp Panel by RT-PCR (Flu A&B, Covid) Nasopharyngeal Swab     Status: None   Collection Time: 04/21/21  3:16 PM   Specimen: Nasopharyngeal Swab; Nasopharyngeal(NP) swabs in vial transport medium  Result Value Ref Range   SARS Coronavirus 2 by RT PCR NEGATIVE NEGATIVE    Comment: (NOTE) SARS-CoV-2 target nucleic acids are NOT DETECTED.  The SARS-CoV-2 RNA is generally detectable in upper respiratory specimens during the acute phase of infection. The lowest concentration of SARS-CoV-2 viral copies this assay can  detect is 138 copies/mL. A negative result does not preclude SARS-Cov-2 infection and should not be used as the sole basis for treatment or other patient management decisions. A negative result may occur with  improper specimen collection/handling, submission of specimen other than nasopharyngeal swab, presence of viral mutation(s) within the areas targeted by this assay, and inadequate number of viral copies(<138 copies/mL). A negative result must be combined with clinical observations, patient history, and epidemiological information. The expected result is Negative.  Fact Sheet for Patients:  BloggerCourse.comhttps://www.fda.gov/media/152166/download  Fact Sheet for Healthcare Providers:  SeriousBroker.ithttps://www.fda.gov/media/152162/download  This test is no t yet approved or cleared by the [place] FDA and  has been authorized for detection and/or diagnosis of SARS-CoV-2 by FDA under an Emergency Use Authorization (EUA). This EUA will remain  in effect (meaning this test can be used) for the duration of the COVID-19 declaration under Section 564(b)(1) of the Act, 21 U.S.C.section 360bbb-3(b)(1), unless the authorization is terminated  or revoked sooner.       Influenza A by PCR NEGATIVE NEGATIVE   Influenza B by PCR NEGATIVE NEGATIVE    Comment: (NOTE) The Xpert Xpress SARS-CoV-2/FLU/RSV plus assay is intended as an aid in the diagnosis of influenza from Nasopharyngeal swab specimens and should not be used as a sole basis for treatment. Nasal washings and aspirates are unacceptable for Xpert Xpress SARS-CoV-2/FLU/RSV testing.  Fact Sheet for Patients: BloggerCourse.comhttps://www.fda.gov/media/152166/download  Fact Sheet for Healthcare Providers: SeriousBroker.ithttps://www.fda.gov/media/152162/download  This test is not yet approved or cleared by the [place] FDA and has been authorized for detection and/or diagnosis of SARS-CoV-2 by FDA under an Emergency Use Authorization (EUA). This EUA will remain in effect  (meaning this test can be used) for the duration of the COVID-19 declaration under Section 564(b)(1) of the Act, 21 U.S.C. section 360bbb-3(b)(1), unless the authorization is terminated or revoked.  Performed at [facility], [address] [place], [place] [id number]   Urine rapid drug screen (hosp performed)     Status: Abnormal   Collection Time: 04/21/21 11:00 PM  Result Value Ref Range   Opiates NONE DETECTED NONE DETECTED   Cocaine NONE DETECTED NONE DETECTED   Benzodiazepines POSITIVE (A) NONE DETECTED   Amphetamines POSITIVE (A) NONE DETECTED   Tetrahydrocannabinol NONE DETECTED NONE DETECTED   Barbiturates NONE DETECTED NONE DETECTED    Comment: (NOTE) DRUG SCREEN FOR MEDICAL PURPOSES ONLY.  IF CONFIRMATION IS NEEDED FOR ANY PURPOSE, NOTIFY LAB WITHIN 5 DAYS.  LOWEST DETECTABLE LIMITS FOR URINE DRUG SCREEN Drug Class                     Cutoff (ng/mL) Amphetamine and metabolites    1000 Barbiturate and metabolites    200 Benzodiazepine                 200 Tricyclics and metabolites     300 Opiates and metabolites        300 Cocaine and  metabolites        300 THC                            50 Performed at [facility], [address] [place], [place] [address]     No current facility-administered medications for this encounter.   Current Outpatient Medications  Medication Sig Dispense Refill   acetaminophen 325 MG tablet Take 2 tablets (650 mg total) by mouth every 6 (six) hours as needed. (Patient not taking: Reported on 03/25/2021)     gabapentin (NEURONTIN) 300 MG capsule Take 1 capsule (300 mg total) by mouth 3 (three) times daily as needed. 60 capsule 0   ibuprofen (ADVIL) 200 MG tablet Take 200 mg by mouth every 6 (six) hours as needed.     traMADol (ULTRAM) 50 MG tablet Take 1 tablet (50 mg total) by mouth every 6 (six) hours as needed. (Patient not taking: Reported on 03/25/2021) 15 tablet 0     Musculoskeletal: Strength & Muscle Tone: within normal limits Gait & Station: normal Patient leans: N/A  Psychiatric Specialty Exam:  Presentation  General Appearance: Disheveled  Eye Contact:Fleeting  Speech:Normal Rate  Speech Volume:Normal  Handedness:Right   Mood and Affect  Mood:Irritable  Affect:Congruent  Thought Process  Thought Processes:Coherent  Descriptions of Associations:Intact  Orientation:Full (Time, Place and Person)  Thought Content:Logical  History of Schizophrenia/Schizoaffective disorder:No  Duration of Psychotic Symptoms:N/A  Hallucinations:Hallucinations: None  Ideas of Reference:None  Suicidal Thoughts:Suicidal Thoughts: No  Homicidal Thoughts:Homicidal Thoughts: No  Sensorium  Memory:Immediate Fair; Recent Fair; Remote Fair  Judgment:Poor  Insight:Shallow   Executive Functions  Concentration:Fair  Attention Span:Fair  Recall:Fair  Fund of Knowledge:Fair  Language:Fair  Psychomotor Activity  Psychomotor Activity:Psychomotor Activity: Normal   Assets  Assets:Resilience; Social Support; Communication Skills  Sleep  Sleep:Sleep: Fair  Physical Exam: Physical Exam Vitals and nursing note reviewed.  HENT:     Head: Normocephalic.     Nose: Nose normal.  Eyes:     Pupils: Pupils are equal, round, and reactive to light.  Pulmonary:     Effort: Pulmonary effort is normal.  Musculoskeletal:        General: Normal range of motion.     Cervical back: Normal range of motion.  Neurological:     General: No focal deficit present.     Mental Status: She is alert and oriented to person, place, and time.  Psychiatric:        Attention and Perception: Attention normal. She does not perceive auditory or visual hallucinations.        Mood and Affect: Mood is depressed.        Speech: Speech normal.        Behavior: Behavior normal. Behavior is cooperative.        Thought Content: Thought content is not paranoid  or delusional. Thought content does not include homicidal or suicidal ideation. Thought content does not include homicidal or suicidal plan.   Review of Systems  Constitutional: Negative.  Negative for fever.  HENT: Negative.  Negative for congestion, sinus pain and sore throat.   Respiratory: Negative.  Negative for cough and shortness of breath.   Cardiovascular: Negative.  Negative for chest pain.  Musculoskeletal: Negative.   Neurological: Negative.   Psychiatric/Behavioral:  Positive for depression and substance abuse.    Blood pressure 111/81, pulse 71, temperature (!) 97.3 F (36.3 C), temperature source Oral, resp. rate 18, height 5'  6" (1.676 m), weight 63.5 kg, SpO2 100 %. Body mass index is 22.6 kg/m.  Treatment Plan Summary: Daily contact with patient to assess and evaluate symptoms and progress in treatment, Medication management, and Plan Patient is psychiatrically clear. Homeless resources and a bus pass were provided. Patient declined substance abuse rehabilitation options offered to her.   Disposition: No evidence of imminent risk to self or others at present.   Patient does not meet criteria for psychiatric inpatient admission. Supportive therapy provided about ongoing stressors. Discussed crisis plan, support from social network, calling 911, coming to the Emergency Department, and calling Suicide Hotline.  Laveda Abbe, NP 04/22/2021 2:54 PM

## 2021-04-22 NOTE — ED Notes (Signed)
Note appears to be resting quietly at this time , respirations even and unlabored , no distress noted , will continue to monitor for safety

## 2021-04-22 NOTE — H&P (Signed)
Behavioral Health Medical Screening Exam  [name] is a 46 y.o. female who presented to [facility] after being discharged from [facility] at 64 today by this provider (see note under that encounter). Patient was seen, chart reviewed and case discussed with  Dr Lucianne Muss. She presented looking for "mental health" help, however did not indicate to this provider or on her registration sheet exactly what type of help she needed. When questioned further patient stated she needs a place to stay and asked if we have any beds. Patient is homeless and was provided with a bus pass and shelter resources when she was discharged from [facility].   Patient asked if there are any beds at [facility] and when told there are not, she stated "I don't have any clothes, everything smells like cat pee." Patient is wearing jeans, a sweatshirt and the yellow socks from the emergency room. She is disheveled and anxious. Her shoes are in the locker in the lobby. Patient denies SI/HI/AVH, paranoia and delusions. She stated she has no place to go and needs a place to stay. Patient had been staying with her daughter until yesterday. Patient was found by police in a parking lot, hallucinating prior to being taken to [facility] on 1/25. She has a history of amphetamine abuse, her UDS at [facility] was positive for amphetamines. Patient was shown the shelter resource packet from the emergency room and also provided with substance abuse resources for [facility], [employer], [employer], and [employer]. Patient was encouraged to call the substance abuse resources and start the intake process. She was provided with another bus pass. Patient was given strict return precautions; call 911, go to the nearest emergency room, return to [facility] or [facility] or call the national suicide hotline.   Patient does not meet criteria for inpatient admission.   Total Time spent with patient: 20 minutes  Psychiatric Specialty Exam:  Presentation  General Appearance:  Disheveled  Eye Contact:Fair  Speech:Clear and Coherent  Speech Volume:Normal  Handedness:Right   Mood and Affect  Mood:Anxious; Irritable  Affect:Congruent   Thought Process  Thought Processes:Goal Directed  Descriptions of Associations:Intact  Orientation:Full (Time, Place and Person)  Thought Content:Logical; Rumination  History of Schizophrenia/Schizoaffective disorder:No  Duration of Psychotic Symptoms:N/A  Hallucinations:Hallucinations: None  Ideas of Reference:None  Suicidal Thoughts:Suicidal Thoughts: No  Homicidal Thoughts:Homicidal Thoughts: No   Sensorium  Memory:Immediate Fair; Recent Fair; Remote Fair  Judgment:Poor  Insight:Shallow  Executive Functions  Concentration:Fair  Attention Span:Fair  Recall:Fair  Fund of Knowledge:Fair  Language:Fair  Psychomotor Activity  Psychomotor Activity:Psychomotor Activity: Normal  Assets  Assets:Communication Skills; Resilience  Sleep  Sleep:Sleep: Fair  Physical Exam: Physical Exam Vitals reviewed.  HENT:     Head: Normocephalic.     Nose: Nose normal.  Eyes:     Pupils: Pupils are equal, round, and reactive to light.  Musculoskeletal:        General: Normal range of motion.     Cervical back: Normal range of motion.  Neurological:     General: No focal deficit present.     Mental Status: She is alert and oriented to person, place, and time.  Psychiatric:        Attention and Perception: Attention normal. She does not perceive auditory or visual hallucinations.        Mood and Affect: Mood is anxious.        Speech: Speech normal.        Behavior: Behavior normal. Behavior is cooperative.  Thought Content: Thought content normal. Thought content is not paranoid or delusional. Thought content does not include homicidal or suicidal ideation. Thought content does not include homicidal or suicidal plan.   Review of Systems  Constitutional: Negative.  Negative for fever.  HENT:  Negative.  Negative for congestion and sore throat.   Respiratory: Negative.  Negative for cough and shortness of breath.   Musculoskeletal: Negative.   Neurological: Negative.   Psychiatric/Behavioral:  Positive for substance abuse. The patient is nervous/anxious.    Blood pressure (!) 133/99, pulse (!) 114, temperature 98.4 F (36.9 C), temperature source Oral, resp. rate 18, SpO2 99 %. There is no height or weight on file to calculate BMI.  Musculoskeletal: Strength & Muscle Tone: within normal limits Gait & Station: normal Patient leans: N/A  Recommendations:  Based on my evaluation the patient does not appear to have an emergency medical condition. Patient provided with several substance abuse resources, a bus pass, shelter resources and strict return precautions (see above).   Laveda Abbe, NP 04/22/2021, 4:58 PM

## 2021-04-22 NOTE — ED Provider Notes (Signed)
Behavioral Health Admission [facility] OBS)  Date: 04/23/21 Patient Name: [name] MRN: [id number] Chief Complaint:  Chief Complaint  Patient presents with   Suicidal      Diagnoses:  Final diagnoses:  PTSD (post-traumatic stress disorder)  Methamphetamine abuse (HCC)  Substance induced mood disorder (HCC)  Substance-induced psychotic disorder with hallucinations (HCC)    HPI: [name] is a 46 y.o. female with a history of depression, anxiety, PTSD, substance induced mood disorder and methamphetamine abuse who presents voluntarily to [facility] with law enforcement due to SI and AVH. Patient was evaluated at [facility] today and again at [facility] as a walk-in. Patient denies SI/HI/AVH during those assessments.   On evaluation at [facility], the patient is alert and oriented x 4. She is restless, but cooperative with assessment. Speech is clear and coherent. Patient reports that she is hearing voices. She has a difficult time describing the voices. She states "they ain't' telling me to hurt myself or other people." She reports VH of orbs and lights. Patient does not appear that she is responding to internal stimuli. Patient reports paranoia. She is unable to describe what she is worrying about or what may be causing her fear. She states "I am just always paranoid." Patient reports suicidal ideations without a plan. She denies HI. She reports that she has been abusing adderall. States last uses was 2 days ago. She reports occasional use of marijuana. Denies use of alcohol and other substances. UDS positve for methamphetamines/amphetamines and benzodiazepines (benzo given in the ED).  PHQ 2-9:  Flowsheet Row Office Visit from 08/26/2015 in [facility]  Thoughts that you would be better off dead, or of hurting yourself in some way Not at all  PHQ-9 Total Score 13       Flowsheet Row ED from 04/21/2021 in [facility] from  03/25/2021 in [facility] EMERGENCY DEPARTMENT ED from 07/07/2020 in [facility] EMERGENCY DEPARTMENT  C-SSRS RISK CATEGORY No Risk Moderate Risk Error: Question 6 not populated        Total Time spent with patient: 20 minutes  Musculoskeletal  Strength & Muscle Tone: within normal limits Gait & Station: normal Patient leans: N/A  Psychiatric Specialty Exam  Presentation General Appearance: Disheveled  Eye Contact:Fair  Speech:Clear and Coherent  Speech Volume:Normal  Handedness:Right   Mood and Affect  Mood:Anxious; Depressed; Hopeless; Worthless  Affect:Congruent; Depressed   Thought Process  Thought Processes:Coherent; Goal Directed  Descriptions of Associations:Intact  Orientation:Full (Time, Place and Person)  Thought Content:Logical  Diagnosis of Schizophrenia or Schizoaffective disorder in past: No  Duration of Psychotic Symptoms: Less than six months  Hallucinations:Hallucinations: Auditory Description of Auditory Hallucinations: hears voices that she has difficulty describing. denies command hallucinations.  Ideas of Reference:Other (comment) (patient reprots that she is constatntly paranoid, but unable to describe)  Suicidal Thoughts:Suicidal Thoughts: Yes, Active SI Active Intent and/or Plan: Without Intent; Without Plan  Homicidal Thoughts:Homicidal Thoughts: No   Sensorium  Memory:Immediate Fair; Recent Fair; Remote Fair  Judgment:Impaired  Insight:Shallow   Executive Functions  Concentration:Fair  Attention Span:Fair  Recall:Fair  Fund of Knowledge:Fair  Language:Fair   Psychomotor Activity  Psychomotor Activity:Psychomotor Activity: Restlessness   Assets  Assets:Communication Skills; Desire for Improvement; Resilience; Physical Health   Sleep  Sleep:Sleep: Poor Number of Hours of Sleep: 5   Nutritional Assessment (For OBS and FBC admissions only) Has the patient had a weight loss or gain of 10 pounds  or more in the last 3 months?: No Has the patient had a decrease in food intake/or appetite?: No Does the patient have dental problems?: No Does the patient have eating habits or behaviors that may be indicators of an eating disorder including binging or inducing vomiting?: No Has the patient recently lost weight without trying?: 0 Has the patient been eating poorly because of a decreased appetite?: 0 Malnutrition Screening Tool Score: 0    Physical Exam Constitutional:      General: She is not in acute distress.    Appearance: She is not ill-appearing, toxic-appearing or diaphoretic.  HENT:     Head: Normocephalic.     Right Ear: External ear normal.     Left Ear: External ear normal.  Eyes:     Conjunctiva/sclera: Conjunctivae normal.     Pupils: Pupils are equal, round, and reactive to light.  Cardiovascular:     Rate and Rhythm: Normal rate.  Pulmonary:     Effort: Pulmonary effort is normal. No respiratory distress.  Musculoskeletal:        General: Normal range of motion.  Skin:    General: Skin is warm and dry.  Neurological:     Mental Status: She is alert and oriented to person, place, and time.  Psychiatric:        Mood and Affect: Mood is anxious and depressed.        Behavior: Behavior is cooperative.        Thought Content: Thought content is not delusional. Thought content includes suicidal ideation. Thought content does not include homicidal ideation. Thought content does not include suicidal plan.   Review of Systems  Constitutional:  Negative for chills, diaphoresis, fever, malaise/fatigue and weight loss.  HENT:  Negative for congestion.   Respiratory:  Negative for cough and shortness of breath.   Cardiovascular:  Negative for chest pain and palpitations.  Gastrointestinal:  Negative for diarrhea, nausea and vomiting.  Neurological:  Negative for dizziness and seizures.  Psychiatric/Behavioral:  Positive for depression, hallucinations, substance abuse and  suicidal ideas. Negative for memory loss. The patient is nervous/anxious and has insomnia.   All other systems reviewed and are negative.  Blood pressure (!) 136/103, pulse 90, temperature 98.4 F (36.9 C), temperature source Oral, resp. rate 16, SpO2 100 %. There is no height or weight on file to calculate BMI.  Past Psychiatric History: epression, anxiety, PTSD, substance induced mood disorder and methamphetamine abuse  Is the patient at risk to self? Yes  Has the patient been a risk to self in the past 6 months? No .    Has the patient been a risk to self within the distant past? Yes   Is the patient a risk to others? No   Has the patient been a risk to others in the past 6 months? No   Has the patient been a risk to others within the distant past? No   Past Medical History:  Past Medical History:  Diagnosis Date   Anxiety    Arthritis    Asthma    last flareup was yr or so with bronchitis   Bipolar disorder (HCC)    Carpal tunnel syndrome    COPD (chronic obstructive pulmonary disease) (HCC)    Depressed    Dysrhythmia    'skipped beats every now and then"   Emphysema    does smoke, and has slowed down   Fibromyalgia    Fibromyalgia, primary    GERD (gastroesophageal reflux disease)  periodic indigestion   Hepatitis C    Hypertension    dx a couple of months ago   Kidney stone    Manic depressive disorder (HCC)    Nephrolithiasis    PTSD (post-traumatic stress disorder)    UTI (lower urinary tract infection)     Past Surgical History:  Procedure Laterality Date   ABDOMINAL HYSTERECTOMY     ANTERIOR CERVICAL DECOMP/DISCECTOMY FUSION N/A 04/22/2015   Procedure: Cervical 4-5, Cervical 5-6 Anterior Cervical Discectomy and Fusion, Allograft, Plate;  Surgeon: Eldred Manges, MD;  Location: [facility] OR;  Service: Orthopedics;  Laterality: N/A;   CARPAL TUNNEL RELEASE     right hand   ORIF ANKLE FRACTURE  02/03/2012   Procedure: OPEN REDUCTION INTERNAL FIXATION (ORIF) ANKLE  FRACTURE;  Surgeon: Nadara Mustard, MD;  Location: [facility] OR;  Service: Orthopedics;  Laterality: Right;  Open Reduction Internal Fixation Right Ankle   ORIF CLAVICULAR FRACTURE Left 08/29/2012   Dr Ophelia Charter   ORIF CLAVICULAR FRACTURE Left 08/29/2012   Procedure: OPEN REDUCTION INTERNAL FIXATION (ORIF) CLAVICULAR FRACTURE;  Surgeon: Eldred Manges, MD;  Location: [facility] OR;  Service: Orthopedics;  Laterality: Left;  Open Reduction Internal Fixation Left Clavicle Fracture   SPLENECTOMY, 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   Procedure: Splenectomy;  Surgeon: Kinsinger, De Blanch, MD;  Location: [facility] OR;  Service: General;;   TUBAL LIGATION     WISDOM TOOTH EXTRACTION      Family History:  Family History  Problem Relation Age of Onset   Alcohol abuse Father    Cirrhosis Father    Cancer Father    Heart attack Mother    Hypertension Mother    Hyperthyroidism Sister    Bipolar disorder Sister    Bipolar disorder Sister    Kidney disease Brother    Bipolar disorder Brother     Social History:  Social History   Socioeconomic History   Marital status: Single    Spouse name: Not on file   Number of children: 4   Years of education: 8th   Highest education level: Not on file  Occupational History    Employer: [employer]    Comment: [employer]  Tobacco Use   Smoking status: Every Day    Packs/day: 1.00    Years: 28.00    Pack years: 28.00    Types: Cigarettes    Start date: 03/28/1990   Smokeless tobacco: Never  Vaping Use   Vaping Use: Never used  Substance and Sexual Activity   Alcohol use: Yes    Comment: occ mixed drink or liquor   Drug use: Not Currently    Types: Marijuana    Comment: last marijuana use 05/27/2019   Sexual activity: Not on file  Other Topics Concern   Not on file  Social History Narrative   ** Merged History Encounter **       As of 02/20/12:  Completed some high school. Has a fiance Conley Rolls) who brings her to appointments. Unemployed. No regular exercise. Feels safe  in her relationships  Has smoked cigarettes since she was 46 years old, currently doing 1/2 to 1 pack    every two days. Denies recreational drug use currently or a history of ever using IV drugs. Occasionally drinks a couple drinks about twice per week. There was a time where she drank 2-3 drinks either every day or every other day for about three years du   ring 2009-2012.  Note: after reviewing prior records, patient has  had multiple admissions to behavioral health, several of which mention alcohol abuse, and at least one of which was for alcohol detoxification. Patient did not share this information duri   ng our visit.  Has four children (ages 35, 46, 36, 47). The 16 year old lives with pt's sister in [place] so that they can continue to go to school at the same place. The 45 year old recently moved in with her two months ago, but had previously    been living with pt's sister as well (moved in as there was conflict between child & pt's sister). The other two kids are adults and live on their own.  Caffeine Use: 1-2 cups daily   Social Determinants of Health   Financial Resource Strain: Not on file  Food Insecurity: Not on file  Transportation Needs: Not on file  Physical Activity: Not on file  Stress: Not on file  Social Connections: Not on file  Intimate Partner Violence: Not on file    SDOH:  SDOH Screenings   Alcohol Screen: Not on file  Depression (PHQ2-9): Not on file  Financial Resource Strain: Not on file  Food Insecurity: Not on file  Housing: Not on file  Physical Activity: Not on file  Social Connections: Not on file  Stress: Not on file  Tobacco Use: High Risk   Smoking Tobacco Use: Every Day   Smokeless Tobacco Use: Never   Passive Exposure: Not on file  Transportation Needs: Not on file    Last Labs:  Admission on 04/22/2021  Component Date Value Ref Range Status   SARS Coronavirus 2 by RT PCR 04/22/2021 NEGATIVE  NEGATIVE Final   Comment:  (NOTE) SARS-CoV-2 target nucleic acids are NOT DETECTED.  The SARS-CoV-2 RNA is generally detectable in upper respiratory specimens during the acute phase of infection. The lowest concentration of SARS-CoV-2 viral copies this assay can detect is 138 copies/mL. A negative result does not preclude SARS-Cov-2 infection and should not be used as the sole basis for treatment or other patient management decisions. A negative result may occur with  improper specimen collection/handling, submission of specimen other than nasopharyngeal swab, presence of viral mutation(s) within the areas targeted by this assay, and inadequate number of viral copies(<138 copies/mL). A negative result must be combined with clinical observations, patient history, and epidemiological information. The expected result is Negative.  Fact Sheet for Patients:  BloggerCourse.com  Fact Sheet for Healthcare Providers:  SeriousBroker.it  This test is no                          t yet approved or cleared by the [place] FDA and  has been authorized for detection and/or diagnosis of SARS-CoV-2 by FDA under an Emergency Use Authorization (EUA). This EUA will remain  in effect (meaning this test can be used) for the duration of the COVID-19 declaration under Section 564(b)(1) of the Act, 21 U.S.C.section 360bbb-3(b)(1), unless the authorization is terminated  or revoked sooner.       Influenza A by PCR 04/22/2021 NEGATIVE  NEGATIVE Final   Influenza B by PCR 04/22/2021 NEGATIVE  NEGATIVE Final   Comment: (NOTE) The Xpert Xpress SARS-CoV-2/FLU/RSV plus assay is intended as an aid in the diagnosis of influenza from Nasopharyngeal swab specimens and should not be used as a sole basis for treatment. Nasal washings and aspirates are unacceptable for Xpert Xpress SARS-CoV-2/FLU/RSV testing.  Fact Sheet for Patients: BloggerCourse.com  Fact  Sheet  for Healthcare Providers: SeriousBroker.ithttps://www.fda.gov/media/152162/download  This test is not yet approved or cleared by the [place] FDA and has been authorized for detection and/or diagnosis of SARS-CoV-2 by FDA under an Emergency Use Authorization (EUA). This EUA will remain in effect (meaning this test can be used) for the duration of the COVID-19 declaration under Section 564(b)(1) of the Act, 21 U.S.C. section 360bbb-3(b)(1), unless the authorization is terminated or revoked.  Performed at [facility], [address] [place], [place] [address]    SARS Coronavirus 2 Ag 04/22/2021 Negative  Negative Preliminary   WBC 04/22/2021 10.9 (H)  4.0 - 10.5 K/uL Final   RBC 04/22/2021 3.84 (L)  3.87 - 5.11 MIL/uL Final   Hemoglobin 04/22/2021 11.9 (L)  12.0 - 15.0 g/dL Final   HCT 60/45/409801/26/2023 35.7 (L)  36.0 - 46.0 % Final   MCV 04/22/2021 93.0  80.0 - 100.0 fL Final   MCH 04/22/2021 31.0  26.0 - 34.0 pg Final   MCHC 04/22/2021 33.3  30.0 - 36.0 g/dL Final   RDW 11/91/478201/26/2023 13.2  11.5 - 15.5 % Final   Platelets 04/22/2021 471 (H)  150 - 400 K/uL Final   nRBC 04/22/2021 0.0  0.0 - 0.2 % Final   Neutrophils Relative % 04/22/2021 43  % Final   Neutro Abs 04/22/2021 4.7  1.7 - 7.7 K/uL Final   Lymphocytes Relative 04/22/2021 46  % Final   Lymphs Abs 04/22/2021 5.0 (H)  0.7 - 4.0 K/uL Final   Monocytes Relative 04/22/2021 7  % Final   Monocytes Absolute 04/22/2021 0.8  0.1 - 1.0 K/uL Final   Eosinophils Relative 04/22/2021 4  % Final   Eosinophils Absolute 04/22/2021 0.4  0.0 - 0.5 K/uL Final   Basophils Relative 04/22/2021 0  % Final   Basophils Absolute 04/22/2021 0.0  0.0 - 0.1 K/uL Final   nRBC 04/22/2021 0  0 /100 WBC Final   Abs Immature Granulocytes 04/22/2021 0.00  0.00 - 0.07 K/uL Final   Performed at [facility], [address] [place], [place] [address]   Sodium 04/22/2021 138  135 - 145 mmol/L Final   Potassium 04/22/2021 4.3  3.5 - 5.1 mmol/L Final    Chloride 04/22/2021 104  98 - 111 mmol/L Final   CO2 04/22/2021 26  22 - 32 mmol/L Final   Glucose, Bld 04/22/2021 117 (H)  70 - 99 mg/dL Final   Glucose reference range applies only to samples taken after fasting for at least 8 hours.   BUN 04/22/2021 25 (H)  6 - 20 mg/dL Final   Creatinine, Ser 04/22/2021 0.63  0.44 - 1.00 mg/dL Final   Calcium 30/86/578401/26/2023 9.1  8.9 - 10.3 mg/dL Final   Total Protein 69/62/952801/26/2023 6.6  6.5 - 8.1 g/dL Final   Albumin 41/32/440101/26/2023 3.6  3.5 - 5.0 g/dL Final   AST 02/72/536601/26/2023 51 (H)  15 - 41 U/L Final   ALT 04/22/2021 46 (H)  0 - 44 U/L Final   Alkaline Phosphatase 04/22/2021 67  38 - 126 U/L Final   Total Bilirubin 04/22/2021 0.8  0.3 - 1.2 mg/dL Final   GFR, Estimated 04/22/2021 >60  >60 mL/min Final   Comment: (NOTE) Calculated using the CKD-EPI Creatinine Equation (2021)    Anion gap 04/22/2021 8  5 - 15 Final   Performed at [facility], [address] [place], [place] [address]   Hgb A1c MFr Bld 04/22/2021 5.1  4.8 - 5.6 %  Final   Comment: (NOTE) Pre diabetes:          5.7%-6.4%  Diabetes:              >6.4%  Glycemic control for   <7.0% adults with diabetes    Mean Plasma Glucose 04/22/2021 99.67  mg/dL Final   Performed at [facility], [address] [place], [place] [address]   Alcohol, Ethyl (B) 04/22/2021 <10  <10 mg/dL Final   Comment: (NOTE) Lowest detectable limit for serum alcohol is 10 mg/dL.  For medical purposes only. Performed at [facility], [address], [place], [place] [address]    TSH 04/22/2021 0.401  0.350 - 4.500 uIU/mL Final   Comment: Performed by a 3rd Generation assay with a functional sensitivity of <=0.01 uIU/mL. Performed at [facility], [address] [place], [place] [address]    Preg Test, Ur 04/22/2021 NEGATIVE  NEGATIVE Final   Performed at [facility], [address], [place], [place] [address]   POC Amphetamine UR 04/22/2021 Positive (A)  NONE DETECTED (Cut Off Level  1000 ng/mL) Final   POC Secobarbital (BAR) 04/22/2021 None Detected  NONE DETECTED (Cut Off Level 300 ng/mL) Final   POC Buprenorphine (BUP) 04/22/2021 None Detected  NONE DETECTED (Cut Off Level 10 ng/mL) Final   POC Oxazepam (BZO) 04/22/2021 Positive (A)  NONE DETECTED (Cut Off Level 300 ng/mL) Final   POC Cocaine UR 04/22/2021 None Detected  NONE DETECTED (Cut Off Level 300 ng/mL) Final   POC Methamphetamine UR 04/22/2021 Positive (A)  NONE DETECTED (Cut Off Level 1000 ng/mL) Final   POC Morphine 04/22/2021 None Detected  NONE DETECTED (Cut Off Level 300 ng/mL) Final   POC Oxycodone UR 04/22/2021 None Detected  NONE DETECTED (Cut Off Level 100 ng/mL) Final   POC Methadone UR 04/22/2021 None Detected  NONE DETECTED (Cut Off Level 300 ng/mL) Final   POC Marijuana UR 04/22/2021 None Detected  NONE DETECTED (Cut Off Level 50 ng/mL) Final   SARSCOV2ONAVIRUS 2 AG 04/22/2021 NEGATIVE  NEGATIVE Final   Comment: (NOTE) SARS-CoV-2 antigen NOT DETECTED.   Negative results are presumptive.  Negative results do not preclude SARS-CoV-2 infection and should not be used as the sole basis for treatment or other patient management decisions, including infection  control decisions, particularly in the presence of clinical signs and  symptoms consistent with COVID-19, or in those who have been in contact with the virus.  Negative results must be combined with clinical observations, patient history, and epidemiological information. The expected result is Negative.  Fact Sheet for Patients: https://www.jennings-kim.com/  Fact Sheet for Healthcare Providers: https://alexander-rogers.biz/  This test is not yet approved or cleared by the [place] FDA and  has been authorized for detection and/or diagnosis of SARS-CoV-2 by FDA under an Emergency Use Authorization (EUA).  This EUA will remain in effect (meaning this test can be used) for the duration of  the COV                           ID-19 declaration under Section 564(b)(1) of the Act, 21 U.S.C. section 360bbb-3(b)(1), unless the authorization is terminated or revoked sooner.     Preg Test, Ur 04/22/2021 NEGATIVE  NEGATIVE Final   Comment:        THE SENSITIVITY OF THIS METHODOLOGY IS >24 mIU/mL    Cholesterol 04/22/2021 140  0 - 200 mg/dL Final  Triglycerides 04/22/2021 56  <150 mg/dL Final   HDL 93/90/3009 45  >40 mg/dL Final   Total CHOL/HDL Ratio 04/22/2021 3.1  RATIO Final   VLDL 04/22/2021 11  0 - 40 mg/dL Final   LDL Cholesterol 04/22/2021 84  0 - 99 mg/dL Final   Comment:        Total Cholesterol/HDL:CHD Risk Coronary Heart Disease Risk Table                     Men   Women  1/2 Average Risk   3.4   3.3  Average Risk       5.0   4.4  2 X Average Risk   9.6   7.1  3 X Average Risk  23.4   11.0        Use the calculated Patient Ratio above and the CHD Risk Table to determine the patient's CHD Risk.        ATP III CLASSIFICATION (LDL):  <100     mg/dL   Optimal  233-007  mg/dL   Near or Above                    Optimal  130-159  mg/dL   Borderline  622-633  mg/dL   High  >354     mg/dL   Very High Performed at [facility], [address], [place], [place] [address]   Admission on 04/21/2021, Discharged on 04/22/2021  Component Date Value Ref Range Status   SARS Coronavirus 2 by RT PCR 04/21/2021 NEGATIVE  NEGATIVE Final   Comment: (NOTE) SARS-CoV-2 target nucleic acids are NOT DETECTED.  The SARS-CoV-2 RNA is generally detectable in upper respiratory specimens during the acute phase of infection. The lowest concentration of SARS-CoV-2 viral copies this assay can detect is 138 copies/mL. A negative result does not preclude SARS-Cov-2 infection and should not be used as the sole basis for treatment or other patient management decisions. A negative result may occur with  improper specimen collection/handling, submission of specimen other than nasopharyngeal swab, presence of  viral mutation(s) within the areas targeted by this assay, and inadequate number of viral copies(<138 copies/mL). A negative result must be combined with clinical observations, patient history, and epidemiological information. The expected result is Negative.  Fact Sheet for Patients:  BloggerCourse.com  Fact Sheet for Healthcare Providers:  SeriousBroker.it  This test is no                          t yet approved or cleared by the [place] FDA and  has been authorized for detection and/or diagnosis of SARS-CoV-2 by FDA under an Emergency Use Authorization (EUA). This EUA will remain  in effect (meaning this test can be used) for the duration of the COVID-19 declaration under Section 564(b)(1) of the Act, 21 U.S.C.section 360bbb-3(b)(1), unless the authorization is terminated  or revoked sooner.       Influenza A by PCR 04/21/2021 NEGATIVE  NEGATIVE Final   Influenza B by PCR 04/21/2021 NEGATIVE  NEGATIVE Final   Comment: (NOTE) The Xpert Xpress SARS-CoV-2/FLU/RSV plus assay is intended as an aid in the diagnosis of influenza from Nasopharyngeal swab specimens and should not be used as a sole basis for treatment. Nasal washings and aspirates are unacceptable for Xpert Xpress SARS-CoV-2/FLU/RSV testing.  Fact Sheet for Patients: BloggerCourse.com  Fact Sheet for Healthcare Providers: SeriousBroker.it  This test is not yet approved  or cleared by the [place] and has been authorized for detection and/or diagnosis of SARS-CoV-2 by FDA under an Emergency Use Authorization (EUA). This EUA will remain in effect (meaning this test can be used) for the duration of the COVID-19 declaration under Section 564(b)(1) of the Act, 21 U.S.C. section 360bbb-3(b)(1), unless the authorization is terminated or revoked.  Performed at [facility], [address], [place], [place] [address]    Sodium 04/21/2021 138  135 - 145 mmol/L Final   Potassium 04/21/2021 4.2  3.5 - 5.1 mmol/L Final   Chloride 04/21/2021 105  98 - 111 mmol/L Final   CO2 04/21/2021 24  22 - 32 mmol/L Final   Glucose, Bld 04/21/2021 103 (H)  70 - 99 mg/dL Final   Glucose reference range applies only to samples taken after fasting for at least 8 hours.   BUN 04/21/2021 33 (H)  6 - 20 mg/dL Final   Creatinine, Ser 04/21/2021 1.08 (H)  0.44 - 1.00 mg/dL Final   Calcium 13/24/4010 10.4 (H)  8.9 - 10.3 mg/dL Final   Total Protein 27/25/3664 7.7  6.5 - 8.1 g/dL Final   Albumin 40/34/7425 4.4  3.5 - 5.0 g/dL Final   AST 95/63/8756 76 (H)  15 - 41 U/L Final   ALT 04/21/2021 58 (H)  0 - 44 U/L Final   Alkaline Phosphatase 04/21/2021 81  38 - 126 U/L Final   Total Bilirubin 04/21/2021 1.1  0.3 - 1.2 mg/dL Final   GFR, Estimated 04/21/2021 >60  >60 mL/min Final   Comment: (NOTE) Calculated using the CKD-EPI Creatinine Equation (2021)    Anion gap 04/21/2021 9  5 - 15 Final   Performed at [facility], [address], [place], [place] [address]   Alcohol, Ethyl (B) 04/21/2021 <10  <10 mg/dL Final   Comment: (NOTE) Lowest detectable limit for serum alcohol is 10 mg/dL.  For medical purposes only. Performed at [facility], [address] [place], [place] [address]    Opiates 04/21/2021 NONE DETECTED  NONE DETECTED Final   Cocaine 04/21/2021 NONE DETECTED  NONE DETECTED Final   Benzodiazepines 04/21/2021 POSITIVE (A)  NONE DETECTED Final   Amphetamines 04/21/2021 POSITIVE (A)  NONE DETECTED Final   Tetrahydrocannabinol 04/21/2021 NONE DETECTED  NONE DETECTED Final   Barbiturates 04/21/2021 NONE DETECTED  NONE DETECTED Final   Comment: (NOTE) DRUG SCREEN FOR MEDICAL PURPOSES ONLY.  IF CONFIRMATION IS NEEDED FOR ANY PURPOSE, NOTIFY LAB WITHIN 5 DAYS.  LOWEST DETECTABLE LIMITS FOR URINE DRUG SCREEN Drug Class                     Cutoff  (ng/mL) Amphetamine and metabolites    1000 Barbiturate and metabolites    200 Benzodiazepine                 200 Tricyclics and metabolites     300 Opiates and metabolites        300 Cocaine and metabolites        300 THC                            50 Performed at [facility], [address], [place], [place] [address]    WBC 04/21/2021 11.3 (H)  4.0 - 10.5 K/uL Final   RBC 04/21/2021 3.95  3.87 - 5.11 MIL/uL Final   Hemoglobin 04/21/2021 12.4  12.0 - 15.0 g/dL Final   HCT 40/98/119101/25/2023 36.2  36.0 - 46.0 % Final   MCV 04/21/2021 91.6  80.0 - 100.0 fL Final   MCH 04/21/2021 31.4  26.0 - 34.0 pg Final   MCHC 04/21/2021 34.3  30.0 - 36.0 g/dL Final   RDW 47/82/956201/25/2023 13.4  11.5 - 15.5 % Final   Platelets 04/21/2021 472 (H)  150 - 400 K/uL Final   nRBC 04/21/2021 0.0  0.0 - 0.2 % Final   Neutrophils Relative % 04/21/2021 52  % Final   Neutro Abs 04/21/2021 5.8  1.7 - 7.7 K/uL Final   Lymphocytes Relative 04/21/2021 39  % Final   Lymphs Abs 04/21/2021 4.4 (H)  0.7 - 4.0 K/uL Final   Monocytes Relative 04/21/2021 8  % Final   Monocytes Absolute 04/21/2021 0.9  0.1 - 1.0 K/uL Final   Eosinophils Relative 04/21/2021 1  % Final   Eosinophils Absolute 04/21/2021 0.1  0.0 - 0.5 K/uL Final   Basophils Relative 04/21/2021 0  % Final   Basophils Absolute 04/21/2021 0.1  0.0 - 0.1 K/uL Final   Immature Granulocytes 04/21/2021 0  % Final   Abs Immature Granulocytes 04/21/2021 0.02  0.00 - 0.07 K/uL Final   Reactive, Benign Lymphocytes 04/21/2021 PRESENT   Final   Performed at [facility], [address] [place], [place] [address]  Admission on 03/25/2021, Discharged on 03/26/2021  Component Date Value Ref Range Status   Sodium 03/25/2021 136  135 - 145 mmol/L Final   Potassium 03/25/2021 3.3 (L)  3.5 - 5.1 mmol/L Final   Chloride 03/25/2021 103  98 - 111 mmol/L Final   CO2 03/25/2021 24  22 - 32 mmol/L Final   Glucose, Bld 03/25/2021 111 (H)  70 - 99  mg/dL Final   Glucose reference range applies only to samples taken after fasting for at least 8 hours.   BUN 03/25/2021 23 (H)  6 - 20 mg/dL Final   Creatinine, Ser 03/25/2021 0.85  0.44 - 1.00 mg/dL Final   Calcium 57/84/696212/29/2022 9.3  8.9 - 10.3 mg/dL Final   Total Protein 95/28/413212/29/2022 8.3 (H)  6.5 - 8.1 g/dL Final   Albumin 44/01/027212/29/2022 4.6  3.5 - 5.0 g/dL Final   AST 53/66/440312/29/2022 62 (H)  15 - 41 U/L Final   ALT 03/25/2021 72 (H)  0 - 44 U/L Final   Alkaline Phosphatase 03/25/2021 90  38 - 126 U/L Final   Total Bilirubin 03/25/2021 0.4  0.3 - 1.2 mg/dL Final   GFR, Estimated 03/25/2021 >60  >60 mL/min Final   Comment: (NOTE) Calculated using the CKD-EPI Creatinine Equation (2021)    Anion gap 03/25/2021 9  5 - 15 Final   Performed at [facility], [address] [place], [place] [address]   Alcohol, Ethyl (B) 03/25/2021 <10  <10 mg/dL Final   Comment: (NOTE) Lowest detectable limit for serum alcohol is 10 mg/dL.  For medical purposes only. Performed at [facility], [address] [place], [place] [address]    Salicylate Lvl 03/25/2021 <7.0 (L)  7.0 - 30.0 mg/dL Final   Performed at [facility], [address] [place], [place] [address]   Acetaminophen (Tylenol), Serum 03/25/2021 <10 (L)  10 - 30 ug/mL Final   Comment: (NOTE) Therapeutic concentrations vary significantly. A range of 10-30 ug/mL  may be an effective concentration for many patients. However, some  are best treated at concentrations outside of this range. Acetaminophen concentrations >150 ug/mL at  4 hours after ingestion  and >50 ug/mL at 12 hours after ingestion are often associated with  toxic reactions.  Performed at [facility], [address], [place], [place] [address]    WBC 03/25/2021 10.6 (H)  4.0 - 10.5 K/uL Final   RBC 03/25/2021 4.45  3.87 - 5.11 MIL/uL Final   Hemoglobin 03/25/2021 14.6  12.0 - 15.0 g/dL Final   HCT 14/97/0263 43.2  36.0 - 46.0 % Final   MCV 03/25/2021 97.1  80.0 - 100.0 fL Final   MCH  03/25/2021 32.8  26.0 - 34.0 pg Final   MCHC 03/25/2021 33.8  30.0 - 36.0 g/dL Final   RDW 78/58/8502 13.7  11.5 - 15.5 % Final   Platelets 03/25/2021 542 (H)  150 - 400 K/uL Final   nRBC 03/25/2021 0.0  0.0 - 0.2 % Final   Performed at [facility], [address] [place], [place] [address]   Opiates 03/26/2021 NONE DETECTED  NONE DETECTED Final   Cocaine 03/26/2021 POSITIVE (A)  NONE DETECTED Final   Benzodiazepines 03/26/2021 NONE DETECTED  NONE DETECTED Final   Amphetamines 03/26/2021 POSITIVE (A)  NONE DETECTED Final   Tetrahydrocannabinol 03/26/2021 POSITIVE (A)  NONE DETECTED Final   Barbiturates 03/26/2021 NONE DETECTED  NONE DETECTED Final   Comment: (NOTE) DRUG SCREEN FOR MEDICAL PURPOSES ONLY.  IF CONFIRMATION IS NEEDED FOR ANY PURPOSE, NOTIFY LAB WITHIN 5 DAYS.  LOWEST DETECTABLE LIMITS FOR URINE DRUG SCREEN Drug Class                     Cutoff (ng/mL) Amphetamine and metabolites    1000 Barbiturate and metabolites    200 Benzodiazepine                 200 Tricyclics and metabolites     300 Opiates and metabolites        300 Cocaine and metabolites        300 THC                            50 Performed at [facility], [address] [place], [place] [address]    Preg Test, Ur 03/26/2021 Negative  Negative Final   SARS Coronavirus 2 by RT PCR 03/26/2021 NEGATIVE  NEGATIVE Final   Comment: (NOTE) SARS-CoV-2 target nucleic acids are NOT DETECTED.  The SARS-CoV-2 RNA is generally detectable in upper respiratory specimens during the acute phase of infection. The lowest concentration of SARS-CoV-2 viral copies this assay can detect is 138 copies/mL. A negative result does not preclude SARS-Cov-2 infection and should not be used as the sole basis for treatment or other patient management decisions. A negative result may occur with  improper specimen collection/handling, submission of specimen other than nasopharyngeal swab, presence of viral mutation(s) within  the areas targeted by this assay, and inadequate number of viral copies(<138 copies/mL). A negative result must be combined with clinical observations, patient history, and epidemiological information. The expected result is Negative.  Fact Sheet for Patients:  BloggerCourse.com  Fact Sheet for Healthcare Providers:  SeriousBroker.it  This test is no                          t yet approved or cleared by the [place] FDA and  has been authorized for detection and/or diagnosis of SARS-CoV-2 by FDA under an Emergency Use Authorization (EUA). This EUA will remain  in effect (meaning this test can be used) for the duration of the COVID-19 declaration under Section 564(b)(1) of the Act, 21 U.S.C.section 360bbb-3(b)(1), unless the authorization is terminated  or revoked sooner.       Influenza A by PCR 03/26/2021 NEGATIVE  NEGATIVE Final   Influenza B by PCR 03/26/2021 NEGATIVE  NEGATIVE Final   Comment: (NOTE) The Xpert Xpress SARS-CoV-2/FLU/RSV plus assay is intended as an aid in the diagnosis of influenza from Nasopharyngeal swab specimens and should not be used as a sole basis for treatment. Nasal washings and aspirates are unacceptable for Xpert Xpress SARS-CoV-2/FLU/RSV testing.  Fact Sheet for Patients: BloggerCourse.com  Fact Sheet for Healthcare Providers: SeriousBroker.it  This test is not yet approved or cleared by the [place] FDA and has been authorized for detection and/or diagnosis of SARS-CoV-2 by FDA under an Emergency Use Authorization (EUA). This EUA will remain in effect (meaning this test can be used) for the duration of the COVID-19 declaration under Section 564(b)(1) of the Act, 21 U.S.C. section 360bbb-3(b)(1), unless the authorization is terminated or revoked.  Performed at [facility], [address] [place], [place] [address]    Preg Test, Ur  03/26/2021 NEGATIVE  NEGATIVE Final   Comment:        THE SENSITIVITY OF THIS METHODOLOGY IS >20 mIU/mL. Performed at [facility], [address], [place], [place] [address]    Color, Urine 03/26/2021 AMBER (A)  YELLOW Final   BIOCHEMICALS MAY BE AFFECTED BY COLOR   APPearance 03/26/2021 TURBID (A)  CLEAR Final   Specific Gravity, Urine 03/26/2021 1.023  1.005 - 1.030 Final   pH 03/26/2021 5.0  5.0 - 8.0 Final   Glucose, UA 03/26/2021 NEGATIVE  NEGATIVE mg/dL Final   Hgb urine dipstick 03/26/2021 NEGATIVE  NEGATIVE Final   Bilirubin Urine 03/26/2021 NEGATIVE  NEGATIVE Final   Ketones, ur 03/26/2021 20 (A)  NEGATIVE mg/dL Final   Protein, ur 28/41/3244 NEGATIVE  NEGATIVE mg/dL Final   Nitrite 03/30/7251 NEGATIVE  NEGATIVE Final   Leukocytes,Ua 03/26/2021 NEGATIVE  NEGATIVE Final   RBC / HPF 03/26/2021 0-5  0 - 5 RBC/hpf Final   WBC, UA 03/26/2021 21-50  0 - 5 WBC/hpf Final   Bacteria, UA 03/26/2021 RARE (A)  NONE SEEN Final   Squamous Epithelial / LPF 03/26/2021 0-5  0 - 5 Final   WBC Clumps 03/26/2021 PRESENT   Final   Mucus 03/26/2021 PRESENT   Final   Budding Yeast 03/26/2021 PRESENT   Final   Hyaline Casts, UA 03/26/2021 PRESENT   Final   Performed at [facility], [address], [place], [place] [address]    Allergies: Sulfa antibiotics, Eggs or egg-derived products, Milk-related compounds, Eggs or egg-derived products, and Milk-related compounds  PTA Medications:  Prior to Admission medications   Medication Sig Start Date End Date Taking? Authorizing Provider  acetaminophen 325 MG tablet Take 2 tablets (650 mg total) by mouth every 6 (six) hours as needed. Patient not taking: Reported on 03/25/2021 05/30/19   Maczis, Elmer Sow, PA-C  gabapentin (NEURONTIN) 300 MG capsule Take 1 capsule (300 mg total) by mouth 3 (three) times daily as needed. 05/30/19   Maczis, Elmer Sow, PA-C  ibuprofen (ADVIL) 200 MG tablet Take 200 mg by mouth every 6 (six) hours as needed.    [provider]  traMADol (ULTRAM) 50 MG tablet Take 1 tablet (50 mg total) by mouth every 6 (six) hours as needed. Patient not taking: Reported on 03/25/2021  07/08/20   Geoffery Lyons, MD      Medical Decision Making  This is the patient's third assessment today. At this time she reports SI, AVH, hopelessness, and worthlessness. She will be admitted to continuous assessment.   Patient states that she has been on lexapro, gabapentin, and clonazepam, which she found helpful.   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  Scheduled Meds:  escitalopram  10 mg Oral Daily   gabapentin  300 mg Oral TID   OLANZapine  5 mg Oral QHS   PRN Meds:.acetaminophen, alum & mag hydroxide-simeth, hydrOXYzine, magnesium hydroxide, traZODone     Lab Orders         Resp Panel by RT-PCR (Flu A&B, Covid) Nasopharyngeal Swab         CBC with Differential/Platelet         Comprehensive metabolic panel         Hemoglobin A1c         Ethanol         TSH         Pregnancy, urine         Lipid panel         POC SARS Coronavirus 2 Ag-ED - Nasal Swab         POCT Urine Drug Screen - (ICup)         POC SARS Coronavirus 2 Ag         Pregnancy, urine POC       Clinical Course as of 04/23/21 0208  Thu Apr 22, 2021  2239 POCT Urine Drug Screen - (ICup)(!) UDS positive, oxazepam, methamphetamines [JB]    Clinical Course User Index [JB] Jackelyn Poling, NP    Recommendations  Based on my evaluation the patient does not appear to have an emergency medical condition.  Jackelyn Poling, NP 04/23/21  2:08 AM

## 2021-04-22 NOTE — Discharge Instructions (Addendum)
Please follow-up with the resources provided by the behavioral health and social work team  For your behavioral health needs you are advised to follow up with [facility] at your earliest opportunity:      [facility]      [place], [place] [address]      [phone]      They offer psychiatry/medication management, therapy and substance use disorder treatment.  New patients are seen in their walk-in clinic.  Walk-in hours are Monday - Thursday from 8:00 am - 11:00 am for psychiatry, and Friday from 1:00 pm - 4:00 pm for therapy.  Walk-in patients are seen on a first come, first served basis, so try to arrive as early as possible for the best chance of being seen the same day.  Please note that to be eligible for services you must bring an ID or a piece of mail with your name and a [facility] address.

## 2021-04-23 ENCOUNTER — Encounter (HOSPITAL_COMMUNITY): Payer: Self-pay | Admitting: Nurse Practitioner

## 2021-04-23 DIAGNOSIS — F19951 Other psychoactive substance use, unspecified with psychoactive substance-induced psychotic disorder with hallucinations: Secondary | ICD-10-CM | POA: Diagnosis not present

## 2021-04-23 DIAGNOSIS — F1510 Other stimulant abuse, uncomplicated: Secondary | ICD-10-CM | POA: Diagnosis not present

## 2021-04-23 DIAGNOSIS — F4310 Post-traumatic stress disorder, unspecified: Secondary | ICD-10-CM | POA: Diagnosis not present

## 2021-04-23 DIAGNOSIS — Z20822 Contact with and (suspected) exposure to covid-19: Secondary | ICD-10-CM | POA: Diagnosis not present

## 2021-04-23 LAB — COMPREHENSIVE METABOLIC PANEL
ALT: 46 U/L — ABNORMAL HIGH (ref 0–44)
AST: 51 U/L — ABNORMAL HIGH (ref 15–41)
Albumin: 3.6 g/dL (ref 3.5–5.0)
Alkaline Phosphatase: 67 U/L (ref 38–126)
Anion gap: 8 (ref 5–15)
BUN: 25 mg/dL — ABNORMAL HIGH (ref 6–20)
CO2: 26 mmol/L (ref 22–32)
Calcium: 9.1 mg/dL (ref 8.9–10.3)
Chloride: 104 mmol/L (ref 98–111)
Creatinine, Ser: 0.63 mg/dL (ref 0.44–1.00)
GFR, Estimated: >60 mL/min (ref >60)
Glucose, Bld: 117 mg/dL — ABNORMAL HIGH (ref 70–99)
Potassium: 4.3 mmol/L (ref 3.5–5.1)
Sodium: 138 mmol/L (ref 135–145)
Total Bilirubin: 0.8 mg/dL (ref 0.3–1.2)
Total Protein: 6.6 g/dL (ref 6.5–8.1)

## 2021-04-23 LAB — LIPID PANEL
Cholesterol: 140 mg/dL (ref 0–200)
HDL: 45 mg/dL (ref >40)
LDL Cholesterol: 84 mg/dL (ref 0–99)
Total CHOL/HDL Ratio: 3.1 RATIO
Triglycerides: 56 mg/dL (ref <150)
VLDL: 11 mg/dL (ref 0–40)

## 2021-04-23 LAB — CBC WITH DIFFERENTIAL/PLATELET
Abs Immature Granulocytes: 0 10*3/uL (ref 0.00–0.07)
Basophils Absolute: 0 10*3/uL (ref 0.0–0.1)
Basophils Relative: 0 %
Eosinophils Absolute: 0.4 10*3/uL (ref 0.0–0.5)
Eosinophils Relative: 4 %
HCT: 35.7 % — ABNORMAL LOW (ref 36.0–46.0)
Hemoglobin: 11.9 g/dL — ABNORMAL LOW (ref 12.0–15.0)
Lymphocytes Relative: 46 %
Lymphs Abs: 5 10*3/uL — ABNORMAL HIGH (ref 0.7–4.0)
MCH: 31 pg (ref 26.0–34.0)
MCHC: 33.3 g/dL (ref 30.0–36.0)
MCV: 93 fL (ref 80.0–100.0)
Monocytes Absolute: 0.8 10*3/uL (ref 0.1–1.0)
Monocytes Relative: 7 %
Neutro Abs: 4.7 10*3/uL (ref 1.7–7.7)
Neutrophils Relative %: 43 %
Platelets: 471 10*3/uL — ABNORMAL HIGH (ref 150–400)
RBC: 3.84 MIL/uL — ABNORMAL LOW (ref 3.87–5.11)
RDW: 13.2 % (ref 11.5–15.5)
WBC: 10.9 10*3/uL — ABNORMAL HIGH (ref 4.0–10.5)
nRBC: 0 % (ref 0.0–0.2)
nRBC: 0 /100 WBC

## 2021-04-23 LAB — HEMOGLOBIN A1C
Hgb A1c MFr Bld: 5.1 % (ref 4.8–5.6)
Mean Plasma Glucose: 99.67 mg/dL

## 2021-04-23 LAB — ETHANOL: Alcohol, Ethyl (B): <10 mg/dL (ref <10)

## 2021-04-23 LAB — RESP PANEL BY RT-PCR (FLU A&B, COVID) ARPGX2
Influenza A by PCR: NEGATIVE
Influenza B by PCR: NEGATIVE
SARS Coronavirus 2 by RT PCR: NEGATIVE

## 2021-04-23 LAB — PREGNANCY, URINE: Preg Test, Ur: NEGATIVE

## 2021-04-23 LAB — TSH: TSH: 0.401 u[IU]/mL (ref 0.350–4.500)

## 2021-04-23 NOTE — ED Provider Notes (Signed)
Behavioral Health Progress Note  Date and Time: 04/23/2021 3:47 PM Name: [name] MRN:  [id number]  Subjective:   [name] is a 46 y.o. female with a history of depression, anxiety, PTSD, substance induced mood disorder and methamphetamine abuse who presents voluntarily to [facility] with law enforcement due to [place] on 1/26. She was restarted on her home medications and admitted to the observation unit. UDS+amphetamines, methamphetamine, benzos.etoh negative.  Patient seen and chart reviewed-patient has been medication compliant during her stay in the observation unit.  She has spent the majority of her stay sleeping although awakens to eat.  Patient interviewed this afternoon, patient found lying down in no acute distress.  Patient is somewhat irritable on interview but is agreeable to speak although terminates interview early.  Patient states "I feel lost and I do not feel like being around".  Patient denies active suicidal ideation but describes passive as above.  Patient unable to contract for safety at this time.  Patient denies current HI/AVH; however, reports visual hallucinations at times of "shadows and spots", she specifically states "not people".  She states that her mother unexpectedly passed away in Jan 29, 2023 and since that time "everything went downhill".  She describes her mother as being her biggest support system as well as being her mother's caretaker.  She acknowledges that she was recently discharged from [facility] and indicates that she was discharged on "the medications they have me on here"-Zyprexa 5 mg nightly, gabapentin 300 mg 3 times daily, Lexapro 10 mg.  She states that upon discharge she was unable to be compliant to her medications and makes a comment about them being stolen but then states "I do not know".  Patient minimizes her substance use and when her UDS results are discussed she states "I have used it" (referring to methamphetamine) and that "I do  not need it".  She states that she has 4 children in their 63s with whom she has little contact and describes having no support system.  She becomes increasingly irritable and with further questioning she states that she is "not feeling good" and terminates interview.  Reports nausea and headache.  Discussed option facility based crisis unit patient is amenable. Patient was given the opportunity to ask questions and  All questions answered. Patient verbalized understanding regarding plan of care.      Diagnosis:  Final diagnoses:  PTSD (post-traumatic stress disorder)  Methamphetamine abuse ([place])  Substance induced mood disorder ([facility])  Substance-induced psychotic disorder with hallucinations ([place])    Total Time spent with patient: 20 minutes  Past Psychiatric History: PTSD, MDD, substance use, SIMD Past Medical History:  Past Medical History:  Diagnosis Date   Anxiety    Arthritis    Asthma    last flareup was yr or so with bronchitis   Bipolar disorder ([place])    Carpal tunnel syndrome    COPD (chronic obstructive pulmonary disease) ([place])    Depressed    Dysrhythmia    'skipped beats every now and then"   Emphysema    does smoke, and has slowed down   Fibromyalgia    Fibromyalgia, primary    GERD (gastroesophageal reflux disease)    periodic indigestion   Hepatitis C    Hypertension    dx a couple of months ago   Kidney stone    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)    Nephrolithiasis    PTSD (post-traumatic stress disorder)    UTI (lower urinary tract infection)  Past Surgical History:  Procedure Laterality Date   ABDOMINAL HYSTERECTOMY     ANTERIOR CERVICAL DECOMP/DISCECTOMY FUSION N/A 04/22/2015   Procedure: Cervical 4-5, Cervical 5-6 Anterior Cervical Discectomy and Fusion, Allograft, Plate;  Surgeon: Marybelle Killings, MD;  Location: [place];  Service: Orthopedics;  Laterality: N/A;   CARPAL TUNNEL RELEASE     right hand   ORIF ANKLE FRACTURE  02/03/2012   Procedure: OPEN  REDUCTION INTERNAL FIXATION (ORIF) ANKLE FRACTURE;  Surgeon: Newt Minion, MD;  Location: [place];  Service: Orthopedics;  Laterality: Right;  Open Reduction Internal Fixation Right Ankle   ORIF CLAVICULAR FRACTURE Left 08/29/2012   Dr Lorin Mercy   ORIF CLAVICULAR FRACTURE Left 08/29/2012   Procedure: OPEN REDUCTION INTERNAL FIXATION (ORIF) CLAVICULAR FRACTURE;  Surgeon: Marybelle Killings, MD;  Location: [place];  Service: Orthopedics;  Laterality: Left;  Open Reduction Internal Fixation Left Clavicle Fracture   SPLENECTOMY, 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 Father history of drug abuse. Social History:  Social History   Substance and Sexual Activity  Alcohol Use Yes   Comment: occ mixed drink or liquor     Social History   Substance and Sexual Activity  Drug Use Not Currently   Types: Marijuana   Comment: last marijuana use 05/27/2019    Social History   Socioeconomic History   Marital status: Single    Spouse name: Not on file   Number of children: 4   Years of education: 8th   Highest education level: Not on file  Occupational History    Employer: [employer]    Comment: [employer]  Tobacco Use   Smoking status: Every Day    Packs/day: 1.00    Years: 28.00    Pack years: 28.00    Types: Cigarettes    Start date: 03/28/1990   Smokeless tobacco: Never  Vaping Use   Vaping Use: Never used  Substance and Sexual Activity   Alcohol use: Yes    Comment: occ  mixed drink or liquor   Drug use: Not Currently    Types: Marijuana    Comment: last marijuana use 05/27/2019   Sexual activity: Not on file  Other Topics Concern   Not on file  Social History Narrative   ** Merged History Encounter **       As of 02/20/12:  Completed some high school. Has a fiance Karena Addison) who brings her to appointments. Unemployed. No regular exercise. Feels safe in her relationships  Has smoked cigarettes since she was 46 years old, currently doing 1/2 to 1 pack    every two days. Denies recreational drug use currently or a history of ever using IV drugs. Occasionally drinks a couple drinks about twice per week. There was a time where she drank 2-3 drinks either every day or every other day for about three years  du   ring 2009-2012.  Note: after reviewing prior records, patient has had multiple admissions to behavioral health, several of which mention alcohol abuse, and at least one of which was for alcohol detoxification. Patient did not share this information duri   ng our visit.  Has four children (ages 41, 108, 72, 24). The 1 year old lives with pt's sister in [place] so that they can continue to go to school at the same place. The 45 year old recently moved in with her two months ago, but had previously    been living with pt's sister as well (moved in as there was conflict between child & pt's sister). The other two kids are adults and live on their own.  Caffeine Use: 1-2 cups daily   Social Determinants of Health   Financial Resource Strain: Not on file  Food Insecurity: Not on file  Transportation Needs: Not on file  Physical Activity: Not on file  Stress: Not on file  Social Connections: Not on file   SDOH:  SDOH Screenings   Alcohol Screen: Not on file  Depression (PHQ2-9): Not on file  Financial Resource Strain: Not on file  Food Insecurity: Not on file  Housing: Not on file  Physical Activity: Not on file  Social Connections:  Not on file  Stress: Not on file  Tobacco Use: High Risk   Smoking Tobacco Use: Every Day   Smokeless Tobacco Use: Never   Passive Exposure: Not on file  Transportation Needs: Not on file   Additional Social History:                         Sleep: Fair  Appetite:  Fair  Current Medications:  Current Facility-Administered Medications  Medication Dose Route Frequency Provider Last Rate Last Admin   acetaminophen (TYLENOL) tablet 650 mg  650 mg Oral Q6H PRN Rozetta Nunnery, NP       alum & mag hydroxide-simeth (MAALOX/MYLANTA) 200-200-20 MG/5ML suspension 30 mL  30 mL Oral Q4H PRN Lindon Romp A, NP       escitalopram (LEXAPRO) tablet 10 mg  10 mg Oral Daily Lindon Romp A, NP   10 mg at 04/23/21 1042   gabapentin (NEURONTIN) capsule 300 mg  300 mg Oral TID Lindon Romp A, NP   300 mg at 04/23/21 1042   hydrOXYzine (ATARAX) tablet 25 mg  25 mg Oral TID PRN Rozetta Nunnery, NP       magnesium hydroxide (MILK OF MAGNESIA) suspension 30 mL  30 mL Oral Daily PRN Rozetta Nunnery, NP       OLANZapine (ZYPREXA) tablet 5 mg  5 mg Oral QHS Lindon Romp A, NP   5 mg at 04/22/21 2300   traZODone (DESYREL) tablet 50 mg  50 mg Oral QHS PRN Rozetta Nunnery, NP       Current Outpatient Medications  Medication Sig Dispense Refill   benztropine (COGENTIN) 1 MG tablet Take 1 mg by mouth 2 (two) times daily.     escitalopram (LEXAPRO) 10 MG tablet Take 10 mg by mouth at bedtime.     gabapentin (NEURONTIN) 300 MG capsule Take 1 capsule (300 mg total) by mouth 3 (three) times daily as needed. 60 capsule 0   hydrOXYzine (VISTARIL) 50 MG capsule Take 50 mg by mouth 2 (two) times daily.     lamoTRIgine (LAMICTAL) 25 MG tablet Take 25 mg by mouth daily.  Labs  Lab Results:  Admission on 04/22/2021  Component Date Value Ref Range Status   SARS Coronavirus 2 by RT PCR 04/22/2021 NEGATIVE  NEGATIVE Final   Comment: (NOTE) SARS-CoV-2 target nucleic acids are NOT DETECTED.  The SARS-CoV-2 RNA is  generally detectable in upper respiratory specimens during the acute phase of infection. The lowest concentration of SARS-CoV-2 viral copies this assay can detect is 138 copies/mL. A negative result does not preclude SARS-Cov-2 infection and should not be used as the sole basis for treatment or other patient management decisions. A negative result may occur with  improper specimen collection/handling, submission of specimen other than nasopharyngeal swab, presence of viral mutation(s) within the areas targeted by this assay, and inadequate number of viral copies(<138 copies/mL). A negative result must be combined with clinical observations, patient history, and epidemiological information. The expected result is Negative.  Fact Sheet for Patients:  EntrepreneurPulse.com.au  Fact Sheet for Healthcare Providers:  IncredibleEmployment.be  This test is no                          t yet approved or cleared by the [place] FDA and  has been authorized for detection and/or diagnosis of SARS-CoV-2 by FDA under an Emergency Use Authorization (EUA). This EUA will remain  in effect (meaning this test can be used) for the duration of the COVID-19 declaration under Section 564(b)(1) of the Act, 21 U.S.C.section 360bbb-3(b)(1), unless the authorization is terminated  or revoked sooner.       Influenza A by PCR 04/22/2021 NEGATIVE  NEGATIVE Final   Influenza B by PCR 04/22/2021 NEGATIVE  NEGATIVE Final   Comment: (NOTE) The Xpert Xpress SARS-CoV-2/FLU/RSV plus assay is intended as an aid in the diagnosis of influenza from Nasopharyngeal swab specimens and should not be used as a sole basis for treatment. Nasal washings and aspirates are unacceptable for Xpert Xpress SARS-CoV-2/FLU/RSV testing.  Fact Sheet for Patients: EntrepreneurPulse.com.au  Fact Sheet for Healthcare Providers: IncredibleEmployment.be  This  test is not yet approved or cleared by the [place] FDA and has been authorized for detection and/or diagnosis of SARS-CoV-2 by FDA under an Emergency Use Authorization (EUA). This EUA will remain in effect (meaning this test can be used) for the duration of the COVID-19 declaration under Section 564(b)(1) of the Act, 21 U.S.C. section 360bbb-3(b)(1), unless the authorization is terminated or revoked.  Performed at [facility], [place] [address], [place], [place] [address]    SARS Coronavirus 2 Ag 04/22/2021 Negative  Negative Preliminary   WBC 04/22/2021 10.9 (H)  4.0 - 10.5 K/uL Final   RBC 04/22/2021 3.84 (L)  3.87 - 5.11 MIL/uL Final   Hemoglobin 04/22/2021 11.9 (L)  12.0 - 15.0 g/dL Final   HCT 04/22/2021 35.7 (L)  36.0 - 46.0 % Final   MCV 04/22/2021 93.0  80.0 - 100.0 fL Final   MCH 04/22/2021 31.0  26.0 - 34.0 pg Final   MCHC 04/22/2021 33.3  30.0 - 36.0 g/dL Final   RDW 04/22/2021 13.2  11.5 - 15.5 % Final   Platelets 04/22/2021 471 (H)  150 - 400 K/uL Final   nRBC 04/22/2021 0.0  0.0 - 0.2 % Final   Neutrophils Relative % 04/22/2021 43  % Final   Neutro Abs 04/22/2021 4.7  1.7 - 7.7 K/uL Final   Lymphocytes Relative 04/22/2021 46  % Final   Lymphs Abs 04/22/2021 5.0 (H)  0.7 - 4.0 K/uL Final  Monocytes Relative 04/22/2021 7  % Final   Monocytes Absolute 04/22/2021 0.8  0.1 - 1.0 K/uL Final   Eosinophils Relative 04/22/2021 4  % Final   Eosinophils Absolute 04/22/2021 0.4  0.0 - 0.5 K/uL Final   Basophils Relative 04/22/2021 0  % Final   Basophils Absolute 04/22/2021 0.0  0.0 - 0.1 K/uL Final   nRBC 04/22/2021 0  0 /100 WBC Final   Abs Immature Granulocytes 04/22/2021 0.00  0.00 - 0.07 K/uL Final   Performed at [facility], [place] [address] [place], [place] [address]   Sodium 04/22/2021 138  135 - 145 mmol/L Final   Potassium 04/22/2021 4.3  3.5 - 5.1 mmol/L Final   Chloride 04/22/2021 104  98 - 111 mmol/L Final   CO2 04/22/2021 26  22 - 32 mmol/L Final    Glucose, Bld 04/22/2021 117 (H)  70 - 99 mg/dL Final   Glucose reference range applies only to samples taken after fasting for at least 8 hours.   BUN 04/22/2021 25 (H)  6 - 20 mg/dL Final   Creatinine, Ser 04/22/2021 0.63  0.44 - 1.00 mg/dL Final   Calcium 04/22/2021 9.1  8.9 - 10.3 mg/dL Final   Total Protein 04/22/2021 6.6  6.5 - 8.1 g/dL Final   Albumin 04/22/2021 3.6  3.5 - 5.0 g/dL Final   AST 04/22/2021 51 (H)  15 - 41 U/L Final   ALT 04/22/2021 46 (H)  0 - 44 U/L Final   Alkaline Phosphatase 04/22/2021 67  38 - 126 U/L Final   Total Bilirubin 04/22/2021 0.8  0.3 - 1.2 mg/dL Final   GFR, Estimated 04/22/2021 >60  >60 mL/min Final   Comment: (NOTE) Calculated using the CKD-EPI Creatinine Equation (2021)    Anion gap 04/22/2021 8  5 - 15 Final   Performed at [facility], [place] [address] [place], [place] [address]   Hgb A1c MFr Bld 04/22/2021 5.1  4.8 - 5.6 % Final   Comment: (NOTE) Pre diabetes:          5.7%-6.4%  Diabetes:              >6.4%  Glycemic control for   <7.0% adults with diabetes    Mean Plasma Glucose 04/22/2021 99.67  mg/dL Final   Performed at [facility], [place] [address], [place], [place] [address]   Alcohol, Ethyl (B) 04/22/2021 <10  <10 mg/dL Final   Comment: (NOTE) Lowest detectable limit for serum alcohol is 10 mg/dL.  For medical purposes only. Performed at [facility], [place] [address] [place], [place] [address]    TSH 04/22/2021 0.401  0.350 - 4.500 uIU/mL Final   Comment: Performed by a 3rd Generation assay with a functional sensitivity of <=0.01 uIU/mL. Performed at [facility], [place] [address], [place], [place] [address]    Preg Test, Ur 04/22/2021 NEGATIVE  NEGATIVE Final   Performed at [facility], [place] [address], [place], [place] [address]   POC Amphetamine UR 04/22/2021 Positive (A)  NONE DETECTED (Cut Off Level 1000 ng/mL) Final   POC Secobarbital (BAR) 04/22/2021 None Detected  NONE DETECTED (Cut Off  Level 300 ng/mL) Final   POC Buprenorphine (BUP) 04/22/2021 None Detected  NONE DETECTED (Cut Off Level 10 ng/mL) Final   POC Oxazepam (BZO) 04/22/2021 Positive (A)  NONE DETECTED (Cut Off Level 300 ng/mL) Final   POC Cocaine UR 04/22/2021 None Detected  NONE DETECTED (Cut  Off Level 300 ng/mL) Final   POC Methamphetamine UR 04/22/2021 Positive (A)  NONE DETECTED (Cut Off Level 1000 ng/mL) Final   POC Morphine 04/22/2021 None Detected  NONE DETECTED (Cut Off Level 300 ng/mL) Final   POC Oxycodone UR 04/22/2021 None Detected  NONE DETECTED (Cut Off Level 100 ng/mL) Final   POC Methadone UR 04/22/2021 None Detected  NONE DETECTED (Cut Off Level 300 ng/mL) Final   POC Marijuana UR 04/22/2021 None Detected  NONE DETECTED (Cut Off Level 50 ng/mL) Final   SARSCOV2ONAVIRUS 2 AG 04/22/2021 NEGATIVE  NEGATIVE Final   Comment: (NOTE) SARS-CoV-2 antigen NOT DETECTED.   Negative results are presumptive.  Negative results do not preclude SARS-CoV-2 infection and should not be used as the sole basis for treatment or other patient management decisions, including infection  control decisions, particularly in the presence of clinical signs and  symptoms consistent with COVID-19, or in those who have been in contact with the virus.  Negative results must be combined with clinical observations, patient history, and epidemiological information. The expected result is Negative.  Fact Sheet for Patients: HandmadeRecipes.com.cy  Fact Sheet for Healthcare Providers: FuneralLife.at  This test is not yet approved or cleared by the [place] FDA and  has been authorized for detection and/or diagnosis of SARS-CoV-2 by FDA under an Emergency Use Authorization (EUA).  This EUA will remain in effect (meaning this test can be used) for the duration of  the COV                          ID-19 declaration under Section 564(b)(1) of the Act, 21 U.S.C. section  360bbb-3(b)(1), unless the authorization is terminated or revoked sooner.     Preg Test, Ur 04/22/2021 NEGATIVE  NEGATIVE Final   Comment:        THE SENSITIVITY OF THIS METHODOLOGY IS >24 mIU/mL    Cholesterol 04/22/2021 140  0 - 200 mg/dL Final   Triglycerides 04/22/2021 56  <150 mg/dL Final   HDL 04/22/2021 45  >40 mg/dL Final   Total CHOL/HDL Ratio 04/22/2021 3.1  RATIO Final   VLDL 04/22/2021 11  0 - 40 mg/dL Final   LDL Cholesterol 04/22/2021 84  0 - 99 mg/dL Final   Comment:        Total Cholesterol/HDL:CHD Risk Coronary Heart Disease Risk Table                     Men   Women  1/2 Average Risk   3.4   3.3  Average Risk       5.0   4.4  2 X Average Risk   9.6   7.1  3 X Average Risk  23.4   11.0        Use the calculated Patient Ratio above and the CHD Risk Table to determine the patient's CHD Risk.        ATP III CLASSIFICATION (LDL):  <100     mg/dL   Optimal  100-129  mg/dL   Near or Above                    Optimal  130-159  mg/dL   Borderline  160-189  mg/dL   High  >190     mg/dL   Very High Performed at [place] [address], [place], [place] [address]   Admission on 04/21/2021, Discharged on 04/22/2021  Component Date Value  Ref Range Status   SARS Coronavirus 2 by RT PCR 04/21/2021 NEGATIVE  NEGATIVE Final   Comment: (NOTE) SARS-CoV-2 target nucleic acids are NOT DETECTED.  The SARS-CoV-2 RNA is generally detectable in upper respiratory specimens during the acute phase of infection. The lowest concentration of SARS-CoV-2 viral copies this assay can detect is 138 copies/mL. A negative result does not preclude SARS-Cov-2 infection and should not be used as the sole basis for treatment or other patient management decisions. A negative result may occur with  improper specimen collection/handling, submission of specimen other than nasopharyngeal swab, presence of viral mutation(s) within the areas targeted by this assay, and inadequate number  of viral copies(<138 copies/mL). A negative result must be combined with clinical observations, patient history, and epidemiological information. The expected result is Negative.  Fact Sheet for Patients:  EntrepreneurPulse.com.au  Fact Sheet for Healthcare Providers:  IncredibleEmployment.be  This test is no                          t yet approved or cleared by the [place] FDA and  has been authorized for detection and/or diagnosis of SARS-CoV-2 by FDA under an Emergency Use Authorization (EUA). This EUA will remain  in effect (meaning this test can be used) for the duration of the COVID-19 declaration under Section 564(b)(1) of the Act, 21 U.S.C.section 360bbb-3(b)(1), unless the authorization is terminated  or revoked sooner.       Influenza A by PCR 04/21/2021 NEGATIVE  NEGATIVE Final   Influenza B by PCR 04/21/2021 NEGATIVE  NEGATIVE Final   Comment: (NOTE) The Xpert Xpress SARS-CoV-2/FLU/RSV plus assay is intended as an aid in the diagnosis of influenza from Nasopharyngeal swab specimens and should not be used as a sole basis for treatment. Nasal washings and aspirates are unacceptable for Xpert Xpress SARS-CoV-2/FLU/RSV testing.  Fact Sheet for Patients: EntrepreneurPulse.com.au  Fact Sheet for Healthcare Providers: IncredibleEmployment.be  This test is not yet approved or cleared by the [place] FDA and has been authorized for detection and/or diagnosis of SARS-CoV-2 by FDA under an Emergency Use Authorization (EUA). This EUA will remain in effect (meaning this test can be used) for the duration of the COVID-19 declaration under Section 564(b)(1) of the Act, 21 U.S.C. section 360bbb-3(b)(1), unless the authorization is terminated or revoked.  Performed at [facility], [place] [address], [place], [place] [address]    Sodium 04/21/2021 138  135 - 145 mmol/L Final    Potassium 04/21/2021 4.2  3.5 - 5.1 mmol/L Final   Chloride 04/21/2021 105  98 - 111 mmol/L Final   CO2 04/21/2021 24  22 - 32 mmol/L Final   Glucose, Bld 04/21/2021 103 (H)  70 - 99 mg/dL Final   Glucose reference range applies only to samples taken after fasting for at least 8 hours.   BUN 04/21/2021 33 (H)  6 - 20 mg/dL Final   Creatinine, Ser 04/21/2021 1.08 (H)  0.44 - 1.00 mg/dL Final   Calcium 04/21/2021 10.4 (H)  8.9 - 10.3 mg/dL Final   Total Protein 04/21/2021 7.7  6.5 - 8.1 g/dL Final   Albumin 04/21/2021 4.4  3.5 - 5.0 g/dL Final   AST 04/21/2021 76 (H)  15 - 41 U/L Final   ALT 04/21/2021 58 (H)  0 - 44 U/L Final   Alkaline Phosphatase 04/21/2021 81  38 - 126 U/L Final   Total Bilirubin 04/21/2021 1.1  0.3 - 1.2 mg/dL Final  GFR, Estimated 04/21/2021 >60  >60 mL/min Final   Comment: (NOTE) Calculated using the CKD-EPI Creatinine Equation (2021)    Anion gap 04/21/2021 9  5 - 15 Final   Performed at [facility], [place] [address] [place], [place] [address]   Alcohol, Ethyl (B) 04/21/2021 <10  <10 mg/dL Final   Comment: (NOTE) Lowest detectable limit for serum alcohol is 10 mg/dL.  For medical purposes only. Performed at [facility], [place] [address] [place], [place] [address]    Opiates 04/21/2021 NONE DETECTED  NONE DETECTED Final   Cocaine 04/21/2021 NONE DETECTED  NONE DETECTED Final   Benzodiazepines 04/21/2021 POSITIVE (A)  NONE DETECTED Final   Amphetamines 04/21/2021 POSITIVE (A)  NONE DETECTED Final   Tetrahydrocannabinol 04/21/2021 NONE DETECTED  NONE DETECTED Final   Barbiturates 04/21/2021 NONE DETECTED  NONE DETECTED Final   Comment: (NOTE) DRUG SCREEN FOR MEDICAL PURPOSES ONLY.  IF CONFIRMATION IS NEEDED FOR ANY PURPOSE, NOTIFY LAB WITHIN 5 DAYS.  LOWEST DETECTABLE LIMITS FOR URINE DRUG SCREEN Drug Class                     Cutoff (ng/mL) Amphetamine and metabolites    1000 Barbiturate and metabolites     200 Benzodiazepine                 [id number] Tricyclics and metabolites     300 Opiates and metabolites        300 Cocaine and metabolites        300 THC                            50 Performed at [facility], [place] [address], [place], [place] [address]    WBC 04/21/2021 11.3 (H)  4.0 - 10.5 K/uL Final   RBC 04/21/2021 3.95  3.87 - 5.11 MIL/uL Final   Hemoglobin 04/21/2021 12.4  12.0 - 15.0 g/dL Final   HCT 04/21/2021 36.2  36.0 - 46.0 % Final   MCV 04/21/2021 91.6  80.0 - 100.0 fL Final   MCH 04/21/2021 31.4  26.0 - 34.0 pg Final   MCHC 04/21/2021 34.3  30.0 - 36.0 g/dL Final   RDW 04/21/2021 13.4  11.5 - 15.5 % Final   Platelets 04/21/2021 472 (H)  150 - 400 K/uL Final   nRBC 04/21/2021 0.0  0.0 - 0.2 % Final   Neutrophils Relative % 04/21/2021 52  % Final   Neutro Abs 04/21/2021 5.8  1.7 - 7.7 K/uL Final   Lymphocytes Relative 04/21/2021 39  % Final   Lymphs Abs 04/21/2021 4.4 (H)  0.7 - 4.0 K/uL Final   Monocytes Relative 04/21/2021 8  % Final   Monocytes Absolute 04/21/2021 0.9  0.1 - 1.0 K/uL Final   Eosinophils Relative 04/21/2021 1  % Final   Eosinophils Absolute 04/21/2021 0.1  0.0 - 0.5 K/uL Final   Basophils Relative 04/21/2021 0  % Final   Basophils Absolute 04/21/2021 0.1  0.0 - 0.1 K/uL Final   Immature Granulocytes 04/21/2021 0  % Final   Abs Immature Granulocytes 04/21/2021 0.02  0.00 - 0.07 K/uL Final   Reactive, Benign Lymphocytes 04/21/2021 PRESENT   Final   Performed at [facility], [place] [address], [place], [place] [address]  Admission on 03/25/2021, Discharged on 03/26/2021  Component Date Value Ref Range Status   Sodium 03/25/2021 136  135 - 145 mmol/L  Final   Potassium 03/25/2021 3.3 (L)  3.5 - 5.1 mmol/L Final   Chloride 03/25/2021 103  98 - 111 mmol/L Final   CO2 03/25/2021 24  22 - 32 mmol/L Final   Glucose, Bld 03/25/2021 111 (H)  70 - 99 mg/dL Final   Glucose reference range applies only to samples taken after  fasting for at least 8 hours.   BUN 03/25/2021 23 (H)  6 - 20 mg/dL Final   Creatinine, Ser 03/25/2021 0.85  0.44 - 1.00 mg/dL Final   Calcium 03/25/2021 9.3  8.9 - 10.3 mg/dL Final   Total Protein 03/25/2021 8.3 (H)  6.5 - 8.1 g/dL Final   Albumin 03/25/2021 4.6  3.5 - 5.0 g/dL Final   AST 03/25/2021 62 (H)  15 - 41 U/L Final   ALT 03/25/2021 72 (H)  0 - 44 U/L Final   Alkaline Phosphatase 03/25/2021 90  38 - 126 U/L Final   Total Bilirubin 03/25/2021 0.4  0.3 - 1.2 mg/dL Final   GFR, Estimated 03/25/2021 >60  >60 mL/min Final   Comment: (NOTE) Calculated using the CKD-EPI Creatinine Equation (2021)    Anion gap 03/25/2021 9  5 - 15 Final   Performed at [facility], [address] [place], [place] [address]   Alcohol, Ethyl (B) 03/25/2021 <10  <10 mg/dL Final   Comment: (NOTE) Lowest detectable limit for serum alcohol is 10 mg/dL.  For medical purposes only. Performed at [facility], [address] [place], [place] [id number]    Salicylate Lvl 123XX123 <7.0 (L)  7.0 - 30.0 mg/dL Final   Performed at [facility], [address] [place], [place] [address]   Acetaminophen (Tylenol), Serum 03/25/2021 <10 (L)  10 - 30 ug/mL Final   Comment: (NOTE) Therapeutic concentrations vary significantly. A range of 10-30 ug/mL  may be an effective concentration for many patients. However, some  are best treated at concentrations outside of this range. Acetaminophen concentrations >150 ug/mL at 4 hours after ingestion  and >50 ug/mL at 12 hours after ingestion are often associated with  toxic reactions.  Performed at [facility], [address] [place], [place] [address]    WBC 03/25/2021 10.6 (H)  4.0 - 10.5 K/uL Final   RBC 03/25/2021 4.45  3.87 - 5.11 MIL/uL Final   Hemoglobin 03/25/2021 14.6  12.0 - 15.0 g/dL Final   HCT 03/25/2021 43.2  36.0 - 46.0 % Final   MCV 03/25/2021 97.1  80.0 - 100.0 fL Final   MCH 03/25/2021 32.8  26.0 - 34.0 pg Final   MCHC 03/25/2021 33.8  30.0 - 36.0  g/dL Final   RDW 03/25/2021 13.7  11.5 - 15.5 % Final   Platelets 03/25/2021 542 (H)  150 - 400 K/uL Final   nRBC 03/25/2021 0.0  0.0 - 0.2 % Final   Performed at [facility], [address] [place],  [address]   Opiates 03/26/2021 NONE DETECTED  NONE DETECTED Final   Cocaine 03/26/2021 POSITIVE (A)  NONE DETECTED Final   Benzodiazepines 03/26/2021 NONE DETECTED  NONE DETECTED Final   Amphetamines 03/26/2021 POSITIVE (A)  NONE DETECTED Final   Tetrahydrocannabinol 03/26/2021 POSITIVE (A)  NONE DETECTED Final   Barbiturates 03/26/2021 NONE DETECTED  NONE DETECTED Final   Comment: (NOTE) DRUG SCREEN FOR MEDICAL PURPOSES ONLY.  IF CONFIRMATION IS NEEDED FOR ANY PURPOSE, NOTIFY LAB WITHIN 5 DAYS.  LOWEST DETECTABLE LIMITS FOR URINE DRUG SCREEN Drug Class  Cutoff (ng/mL) Amphetamine and metabolites    1000 Barbiturate and metabolites    200 Benzodiazepine                 200 Tricyclics and metabolites     300 Opiates and metabolites        300 Cocaine and metabolites        300 THC                            50 Performed at [facility], [address] [place], [place] [address]    Preg Test, Ur 03/26/2021 Negative  Negative Final   SARS Coronavirus 2 by RT PCR 03/26/2021 NEGATIVE  NEGATIVE Final   Comment: (NOTE) SARS-CoV-2 target nucleic acids are NOT DETECTED.  The SARS-CoV-2 RNA is generally detectable in upper respiratory specimens during the acute phase of infection. The lowest concentration of SARS-CoV-2 viral copies this assay can detect is 138 copies/mL. A negative result does not preclude SARS-Cov-2 infection and should not be used as the sole basis for treatment or other patient management decisions. A negative result may occur with  improper specimen collection/handling, submission of specimen other than nasopharyngeal swab, presence of viral mutation(s) within the areas targeted by this assay, and inadequate number of viral copies(<138  copies/mL). A negative result must be combined with clinical observations, patient history, and epidemiological information. The expected result is Negative.  Fact Sheet for Patients:  BloggerCourse.com  Fact Sheet for Healthcare Providers:  SeriousBroker.it  This test is no                          t yet approved or cleared by the [place] FDA and  has been authorized for detection and/or diagnosis of SARS-CoV-2 by FDA under an Emergency Use Authorization (EUA). This EUA will remain  in effect (meaning this test can be used) for the duration of the COVID-19 declaration under Section 564(b)(1) of the Act, 21 U.S.C.section 360bbb-3(b)(1), unless the authorization is terminated  or revoked sooner.       Influenza A by PCR 03/26/2021 NEGATIVE  NEGATIVE Final   Influenza B by PCR 03/26/2021 NEGATIVE  NEGATIVE Final   Comment: (NOTE) The Xpert Xpress SARS-CoV-2/FLU/RSV plus assay is intended as an aid in the diagnosis of influenza from Nasopharyngeal swab specimens and should not be used as a sole basis for treatment. Nasal washings and aspirates are unacceptable for Xpert Xpress SARS-CoV-2/FLU/RSV testing.  Fact Sheet for Patients: BloggerCourse.com  Fact Sheet for Healthcare Providers: SeriousBroker.it  This test is not yet approved or cleared by the [place] FDA and has been authorized for detection and/or diagnosis of SARS-CoV-2 by FDA under an Emergency Use Authorization (EUA). This EUA will remain in effect (meaning this test can be used) for the duration of the COVID-19 declaration under Section 564(b)(1) of the Act, 21 U.S.C. section 360bbb-3(b)(1), unless the authorization is terminated or revoked.  Performed at [facility], [address], [place], [place] [address]    Preg Test, Ur 03/26/2021 NEGATIVE  NEGATIVE Final   Comment:        THE SENSITIVITY OF  THIS METHODOLOGY IS >20 mIU/mL. Performed at [facility], [address] [place], [place] [address]    Color, Urine 03/26/2021 AMBER (A)  YELLOW Final   BIOCHEMICALS MAY BE AFFECTED BY COLOR   APPearance 03/26/2021 TURBID (A)  CLEAR Final   Specific Gravity, Urine  03/26/2021 1.023  1.005 - 1.030 Final   pH 03/26/2021 5.0  5.0 - 8.0 Final   Glucose, UA 03/26/2021 NEGATIVE  NEGATIVE mg/dL Final   Hgb urine dipstick 03/26/2021 NEGATIVE  NEGATIVE Final   Bilirubin Urine 03/26/2021 NEGATIVE  NEGATIVE Final   Ketones, ur 03/26/2021 20 (A)  NEGATIVE mg/dL Final   Protein, ur 03/26/2021 NEGATIVE  NEGATIVE mg/dL Final   Nitrite 03/26/2021 NEGATIVE  NEGATIVE Final   Leukocytes,Ua 03/26/2021 NEGATIVE  NEGATIVE Final   RBC / HPF 03/26/2021 0-5  0 - 5 RBC/hpf Final   WBC, UA 03/26/2021 21-50  0 - 5 WBC/hpf Final   Bacteria, UA 03/26/2021 RARE (A)  NONE SEEN Final   Squamous Epithelial / LPF 03/26/2021 0-5  0 - 5 Final   WBC Clumps 03/26/2021 PRESENT   Final   Mucus 03/26/2021 PRESENT   Final   Budding Yeast 03/26/2021 PRESENT   Final   Hyaline Casts, UA 03/26/2021 PRESENT   Final   Performed at [facility], [address], [place], [place] [address]    Blood Alcohol level:  Lab Results  Component Value Date   [facility] <10 04/22/2021   ETH <10 [id number]    Metabolic Disorder Labs: Lab Results  Component Value Date   HGBA1C 5.1 04/22/2021   MPG 99.67 04/22/2021   MPG 105.41 11/10/2017   No results found for: PROLACTIN Lab Results  Component Value Date   CHOL 140 04/22/2021   TRIG 56 04/22/2021   HDL 45 04/22/2021   CHOLHDL 3.1 04/22/2021   VLDL 11 04/22/2021   LDLCALC 84 04/22/2021   LDLCALC 64 11/10/2017    Therapeutic Lab Levels: No results found for: LITHIUM No results found for: VALPROATE No components found for:  CBMZ  Physical Findings   AIMS    Flowsheet Row Admission (Discharged) from 11/08/2017 in [place] [facility]  AIMS Total Score 0       AUDIT    Flowsheet Row Admission (Discharged) from 11/08/2017 in [place]  Alcohol Use Disorder Identification Test Final Score (AUDIT) 5      PHQ2-9    [place] Office Visit from 11/19/2015 in [place] Office Visit from 08/26/2015 in [facility] for Infectious Disease Office Visit from 05/26/2015 in [place] Office Visit from 12/10/2014 in [place] Office Visit from 11/17/2014 in [place]  PHQ-2 Total Score 0 2 0 0 0  PHQ-9 Total Score -- 13 -- -- --      [place] ED from 04/21/2021 in [place] DEPT ED from 03/25/2021 in [place] ED from 07/07/2020 in [place] No Risk Moderate Risk Error: Question 6 not populated        Musculoskeletal  Strength & Muscle Tone: within normal limits Gait & Station: normal Patient leans: N/A  Psychiatric Specialty Exam  Presentation  General Appearance: Disheveled  Eye Contact:Fair  Speech:Clear and Coherent; Normal Rate  Speech Volume:Normal  Handedness:Right   Mood and Affect  Mood:Irritable; Anxious; Dysphoric  Affect:Appropriate; Congruent; Other (comment) (irritable)   Thought Process  Thought Processes:Coherent; Goal Directed; Linear  Descriptions of Associations:Intact  Orientation:Full (Time, Place and Person)  Thought Content:Logical; WDL  Diagnosis of Schizophrenia or Schizoaffective disorder in past: No  Duration of Psychotic Symptoms: Less than six months   Hallucinations:Hallucinations: None Description of  Auditory Hallucinations: hears voices that she has difficulty describing. denies command hallucinations.  Ideas of Reference:None  Suicidal Thoughts:Suicidal Thoughts: Yes, Passive SI Active Intent and/or Plan: Without Intent;  Without Plan  Homicidal Thoughts:Homicidal Thoughts: No   Sensorium  Memory:Immediate Good; Recent Fair; Remote Fair  Judgment:Fair  Insight:Other (comment) (limited)   Executive Functions  Concentration:Fair  Attention Span:Fair  [place]   Psychomotor Activity  Psychomotor Activity:Psychomotor Activity: Normal   Assets  Assets:Communication Skills; Desire for Improvement; Physical Health   Sleep  Sleep:Sleep: Fair Number of Hours of Sleep: 5   Nutritional Assessment (For OBS and FBC admissions only) Has the patient had a weight loss or gain of 10 pounds or more in the last 3 months?: No Has the patient had a decrease in food intake/or appetite?: No Does the patient have dental problems?: No Does the patient have eating habits or behaviors that may be indicators of an eating disorder including binging or inducing vomiting?: No Has the patient recently lost weight without trying?: 0 Has the patient been eating poorly because of a decreased appetite?: 0 Malnutrition Screening Tool Score: 0    Physical Exam  Physical Exam Constitutional:      Appearance: Normal appearance. She is normal weight.  HENT:     Head: Normocephalic and atraumatic.  Eyes:     Extraocular Movements: Extraocular movements intact.     Conjunctiva/sclera: Conjunctivae normal.  Pulmonary:     Effort: Pulmonary effort is normal.  Neurological:     Mental Status: She is alert and oriented to person, place, and time.   Review of Systems  Constitutional:  Negative for chills and fever.  HENT:  Negative for hearing loss.   Eyes:  Negative for discharge and redness.  Respiratory:  Negative for cough.   Cardiovascular:  Negative for chest pain.  Gastrointestinal:  Negative for abdominal pain.  Musculoskeletal:  Negative for myalgias.  Neurological:  Negative for headaches.  Psychiatric/Behavioral:  Positive for depression, substance abuse and  suicidal ideas. Negative for hallucinations.        Passive SI   Blood pressure 117/84, pulse 77, temperature 97.8 F (36.6 C), temperature source Oral, resp. rate 18, SpO2 98 %. There is no height or weight on file to calculate BMI.  Treatment Plan Summary: Alexie Mangrum is a 46 y.o. 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 She was restarted on her home medications and admitted to the observation unit. UDS+amphetamines, methamphetamine, benzos. Etoh negative.  Patient continues to report passive SI and is unable to contract for safety today.  Patient is amenable to transfer to the [facility] for further crisis stabilization.  Will continue medications as previously ordered- will need to obtain further history when patient is more agreeable to participate in interview  MDD PTSD SIMD/SIPD -continue zyprexa 5 mg qhs -continue gabapentin 300 mg TID -continue lexapro 10 mg daily   Stimulant use disorder -UDS+amphetamines; on chart review, has has tested + for amphetmaines multiples times over the past year -would likely benefit from substance use treatment if amenable  Dispo: ongoing. Pending improvement in symptomatology. SW assisting.    Ival Bible, MD 04/23/2021 3:47 PM

## 2021-04-23 NOTE — Progress Notes (Signed)
Senetra slept in late this morning, woke up, ate a muffin and drink apple juice before returning to sleep.

## 2021-04-23 NOTE — ED Notes (Signed)
Pts belongings have been brought over from [facility]. Pts belongings have been placed in a locker. Pts belongings contained a pair of shoes and a pair of socks. The bag also contained a pair of blue paper scrubs and purple scrubs that were soaking wet, mildewed and had a very bad smell. This nurse discarded those scrubs.

## 2021-04-23 NOTE — ED Notes (Signed)
Appears to be resting quietly at this time , respirations even and unlabored , no distress noted , will continue to monitor for safety . °

## 2021-04-23 NOTE — ED Notes (Signed)
Pt is currently sleeping, no distress noted, environmental check complete, will continue to monitor patient for safety. ? ?

## 2021-04-23 NOTE — Progress Notes (Signed)
[name] continued to sleep throughout the morning and was awaken to talk with the doctor. She continues to endorse doubts about her safety, therefore she was transferred to [facility] for further stabilization.Marland Kitchen

## 2021-04-23 NOTE — Progress Notes (Signed)
Pt was transferred from [facility]. Pt is admitted to [facility] due to substance abuse and passive SI. Pt stated that she is "not suicidal but I wish I was dead."  Pt verbally contracts for safety on the unit. Pt is alert and oriented with a tearful affect. Pt is irritable but cooperative. Pt is ambulatory and is oriented to staff/unit. Pt was cooperative with the admission process and skin assessment. Pt denies current HI/AVH. 15 minutes checks for safety initiated. Staff will monitor for pt's safety.

## 2021-04-23 NOTE — ED Notes (Signed)
Patient is resting with no sxs of discomfort - will continue to monitor for safety

## 2021-04-23 NOTE — Progress Notes (Signed)
Pt refused dinner. Pt is presently asleep. Respirations are even and unlabored. No distress noted. Pt's safety is maintained.

## 2021-04-24 DIAGNOSIS — F1510 Other stimulant abuse, uncomplicated: Secondary | ICD-10-CM | POA: Diagnosis not present

## 2021-04-24 DIAGNOSIS — F19951 Other psychoactive substance use, unspecified with psychoactive substance-induced psychotic disorder with hallucinations: Secondary | ICD-10-CM | POA: Diagnosis not present

## 2021-04-24 DIAGNOSIS — F4310 Post-traumatic stress disorder, unspecified: Secondary | ICD-10-CM | POA: Diagnosis not present

## 2021-04-24 DIAGNOSIS — Z20822 Contact with and (suspected) exposure to covid-19: Secondary | ICD-10-CM | POA: Diagnosis not present

## 2021-04-24 NOTE — ED Notes (Signed)
Pt sleeping in no acute distress. RR even and unlabored. Safety maintained. 

## 2021-04-24 NOTE — ED Notes (Signed)
Didn't participate in afternoon group, no handouts given

## 2021-04-24 NOTE — ED Notes (Signed)
Pt sleeping@this time. Breathing even and unlabored. Will continue to monitor for safety 

## 2021-04-24 NOTE — ED Provider Notes (Signed)
Behavioral Health Progress Note  Date and Time: 04/24/2021 11:55 AM Name: [name] Selvy MRN:  [id number]  Subjective:   Melanie ReveringAmanda Christensen Mccamish, 46 y.o., female patient who initially presented with [facility] with law enforcement due to SI and AVH on 04/22/2021.  She was admitted to the continuous assessment unit for overnight observation.  She was reassessed and was recommended for admission to the facility base crisis unit for stabilization. Patient seen face to face by this provider, and consulted with Dr. Lucianne MussKumar; and  chart reviewed on 04/24/21.  Per chart review patient has a history of depression, anxiety, history, substance-induced mood disorder and methamphetamine abuse.  She does not have outpatient psychiatric services in place.  She [facility].  On assessment [name] Prust is laying in her bed.  She is easily awakened.  She is disheveled and makes fair eye contact.  Her speech is clear, coherent, normal rate and tone.  She is tearful throughout the assessment she continues to endorse increased depression and anxiety.  She endorses crying spells, increased irritability, decrease in energy, feelings of hopelessness and worthlessness.  She contributes an increase in her depression due to her mother passing away in October 2022 and being homeless.  Reports she has children and other family members but, "no one cares about me they do not talk to me".  She does not appear to be responding to internal/external stimuli.  She endorses visual hallucinations of "seeing shadows and spots".  She endorses auditory hallucinations, "I hear voices but I do not know what they are saying".  She endorses suicidal ideations without intent or plan.  She denies access to firearms/weapons.  She cannot contract for safety at this time.  She endorses methamphetamine use and is requesting residential treatment. UDS + amphetamine, methamphetamine, and benzodiazepines. BAL negative,<10.  She  denies any withdrawal symptoms other than she feels a little "groggy" this morning.  Discussed side effects of Zyprexa.  We will continue to monitor.  She denies any health concerns    Diagnosis:  Final diagnoses:  PTSD (post-traumatic stress disorder)  Methamphetamine abuse (HCC)  Substance induced mood disorder (HCC)  Substance-induced psychotic disorder with hallucinations (HCC)    Total Time spent with patient: 30 minutes  Past Psychiatric History: See H&P Past Medical History:  Past Medical History:  Diagnosis Date   Anxiety    Arthritis    Asthma    last flareup was yr or so with bronchitis   Bipolar disorder (HCC)    Carpal tunnel syndrome    COPD (chronic obstructive pulmonary disease) (HCC)    Depressed    Dysrhythmia    'skipped beats every now and then"   Emphysema    does smoke, and has slowed down   Fibromyalgia    Fibromyalgia, primary    GERD (gastroesophageal reflux disease)    periodic indigestion   Hepatitis C    Hypertension    dx a couple of months ago   Kidney stone    Manic depressive disorder (HCC)    Nephrolithiasis    PTSD (post-traumatic stress disorder)    UTI (lower urinary tract infection)     Past Surgical History:  Procedure Laterality Date   ABDOMINAL HYSTERECTOMY     ANTERIOR CERVICAL DECOMP/DISCECTOMY FUSION N/A 04/22/2015   Procedure: Cervical 4-5, Cervical 5-6 Anterior Cervical Discectomy and Fusion, Allograft, Plate;  Surgeon: Eldred MangesMark C Yates, MD;  Location: [facility] OR;  Service: Orthopedics;  Laterality: N/A;  CARPAL TUNNEL RELEASE     right hand   ORIF ANKLE FRACTURE  02/03/2012   Procedure: OPEN REDUCTION INTERNAL FIXATION (ORIF) ANKLE FRACTURE;  Surgeon: Nadara Mustard, MD;  Location: [facility] OR;  Service: Orthopedics;  Laterality: Right;  Open Reduction Internal Fixation Right Ankle   ORIF CLAVICULAR FRACTURE Left 08/29/2012   Dr Ophelia Charter   ORIF CLAVICULAR FRACTURE Left 08/29/2012   Procedure: OPEN REDUCTION INTERNAL FIXATION (ORIF)  CLAVICULAR FRACTURE;  Surgeon: Eldred Manges, MD;  Location: [facility] OR;  Service: Orthopedics;  Laterality: Left;  Open Reduction Internal Fixation Left Clavicle Fracture   SPLENECTOMY, 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   Procedure: Splenectomy;  Surgeon: Kinsinger, De Blanch, MD;  Location: [facility] OR;  Service: General;;   TUBAL LIGATION     WISDOM TOOTH EXTRACTION     Family History:  Family History  Problem Relation Age of Onset   Alcohol abuse Father    Cirrhosis Father    Cancer Father    Heart attack Mother    Hypertension Mother    Hyperthyroidism Sister    Bipolar disorder Sister    Bipolar disorder Sister    Kidney disease Brother    Bipolar disorder Brother    Family Psychiatric  History: See H&P Social History:  Social History   Substance and Sexual Activity  Alcohol Use Yes   Comment: occ mixed drink or liquor     Social History   Substance and Sexual Activity  Drug Use Not Currently   Types: Marijuana   Comment: last marijuana use 05/27/2019    Social History   Socioeconomic History   Marital status: Single    Spouse name: Not on file   Number of children: 4   Years of education: 8th   Highest education level: Not on file  Occupational History    Employer: [employer]    Comment: [employer]  Tobacco Use   Smoking status: Every Day    Packs/day: 1.00    Years: 28.00    Pack years: 28.00    Types: Cigarettes    Start date: 03/28/1990   Smokeless tobacco: Never  Vaping Use   Vaping Use: Never used  Substance and Sexual Activity   Alcohol use: Yes    Comment: occ mixed drink or liquor   Drug use: Not Currently    Types: Marijuana    Comment: last marijuana use 05/27/2019   Sexual activity: Not on file  Other Topics Concern   Not on file  Social History Narrative   ** Merged History Encounter **       As of 02/20/12:  Completed some high school. Has a fiance Conley Rolls) who brings her to appointments. Unemployed. No regular exercise. Feels safe in her  relationships  Has smoked cigarettes since she was 46 years old, currently doing 1/2 to 1 pack    every two days. Denies recreational drug use currently or a history of ever using IV drugs. Occasionally drinks a couple drinks about twice per week. There was a time where she drank 2-3 drinks either every day or every other day for about three years du   ring 2009-2012.  Note: after reviewing prior records, patient has had multiple admissions to behavioral health, several of which mention alcohol abuse, and at least one of which was for alcohol detoxification. Patient did not share this information duri   ng our visit.  Has four children (ages 42, 19, 69, 22). The 53 year old lives with pt's sister  in [place] so that they can continue to go to school at the same place. The 45 year old recently moved in with her two months ago, but had previously    been living with pt's sister as well (moved in as there was conflict between child & pt's sister). The other two kids are adults and live on their own.  Caffeine Use: 1-2 cups daily   Social Determinants of Health   Financial Resource Strain: Not on file  Food Insecurity: Not on file  Transportation Needs: Not on file  Physical Activity: Not on file  Stress: Not on file  Social Connections: Not on file   SDOH:  SDOH Screenings   Alcohol Screen: Not on file  Depression (PHQ2-9): Not on file  Financial Resource Strain: Not on file  Food Insecurity: Not on file  Housing: Not on file  Physical Activity: Not on file  Social Connections: Not on file  Stress: Not on file  Tobacco Use: High Risk   Smoking Tobacco Use: Every Day   Smokeless Tobacco Use: Never   Passive Exposure: Not on file  Transportation Needs: Not on file   Additional Social History:                         Sleep: Fair  Appetite:  Fair  Current Medications:  Current Facility-Administered Medications  Medication Dose Route Frequency Provider Last  Rate Last Admin   acetaminophen (TYLENOL) tablet 650 mg  650 mg Oral Q6H PRN Jackelyn Poling, NP       alum & mag hydroxide-simeth (MAALOX/MYLANTA) 200-200-20 MG/5ML suspension 30 mL  30 mL Oral Q4H PRN Nira Conn A, NP       escitalopram (LEXAPRO) tablet 10 mg  10 mg Oral Daily Nira Conn A, NP   10 mg at 04/24/21 1025   gabapentin (NEURONTIN) capsule 300 mg  300 mg Oral TID Nira Conn A, NP   300 mg at 04/24/21 1025   hydrOXYzine (ATARAX) tablet 25 mg  25 mg Oral TID PRN Jackelyn Poling, NP       magnesium hydroxide (MILK OF MAGNESIA) suspension 30 mL  30 mL Oral Daily PRN Jackelyn Poling, NP       OLANZapine (ZYPREXA) tablet 5 mg  5 mg Oral QHS Nira Conn A, NP   5 mg at 04/23/21 2137   traZODone (DESYREL) tablet 50 mg  50 mg Oral QHS PRN Jackelyn Poling, NP       Current Outpatient Medications  Medication Sig Dispense Refill   benztropine (COGENTIN) 1 MG tablet Take 1 mg by mouth 2 (two) times daily.     escitalopram (LEXAPRO) 10 MG tablet Take 10 mg by mouth at bedtime.     gabapentin (NEURONTIN) 300 MG capsule Take 1 capsule (300 mg total) by mouth 3 (three) times daily as needed. 60 capsule 0   hydrOXYzine (VISTARIL) 50 MG capsule Take 50 mg by mouth 2 (two) times daily.     lamoTRIgine (LAMICTAL) 25 MG tablet Take 25 mg by mouth daily.      Labs  Lab Results:  Admission on 04/22/2021  Component Date Value Ref Range Status   SARS Coronavirus 2 by RT PCR 04/22/2021 NEGATIVE  NEGATIVE Final   Comment: (NOTE) SARS-CoV-2 target nucleic acids are NOT DETECTED.  The SARS-CoV-2 RNA is generally detectable in upper respiratory specimens during the acute phase of infection. The lowest concentration of SARS-CoV-2 viral copies this  assay can detect is 138 copies/mL. A negative result does not preclude SARS-Cov-2 infection and should not be used as the sole basis for treatment or other patient management decisions. A negative result may occur with  improper specimen  collection/handling, submission of specimen other than nasopharyngeal swab, presence of viral mutation(s) within the areas targeted by this assay, and inadequate number of viral copies(<138 copies/mL). A negative result must be combined with clinical observations, patient history, and epidemiological information. The expected result is Negative.  Fact Sheet for Patients:  BloggerCourse.com  Fact Sheet for Healthcare Providers:  SeriousBroker.it  This test is no                          t yet approved or cleared by the [place] FDA and  has been authorized for detection and/or diagnosis of SARS-CoV-2 by FDA under an Emergency Use Authorization (EUA). This EUA will remain  in effect (meaning this test can be used) for the duration of the COVID-19 declaration under Section 564(b)(1) of the Act, 21 U.S.C.section 360bbb-3(b)(1), unless the authorization is terminated  or revoked sooner.       Influenza A by PCR 04/22/2021 NEGATIVE  NEGATIVE Final   Influenza B by PCR 04/22/2021 NEGATIVE  NEGATIVE Final   Comment: (NOTE) The Xpert Xpress SARS-CoV-2/FLU/RSV plus assay is intended as an aid in the diagnosis of influenza from Nasopharyngeal swab specimens and should not be used as a sole basis for treatment. Nasal washings and aspirates are unacceptable for Xpert Xpress SARS-CoV-2/FLU/RSV testing.  Fact Sheet for Patients: BloggerCourse.com  Fact Sheet for Healthcare Providers: SeriousBroker.it  This test is not yet approved or cleared by the [place] FDA and has been authorized for detection and/or diagnosis of SARS-CoV-2 by FDA under an Emergency Use Authorization (EUA). This EUA will remain in effect (meaning this test can be used) for the duration of the COVID-19 declaration under Section 564(b)(1) of the Act, 21 U.S.C. section 360bbb-3(b)(1), unless the authorization is  terminated or revoked.  Performed at [facility], [address], [place], [place] [address]    SARS Coronavirus 2 Ag 04/22/2021 Negative  Negative Preliminary   WBC 04/22/2021 10.9 (H)  4.0 - 10.5 K/uL Final   RBC 04/22/2021 3.84 (Christensen)  3.87 - 5.11 MIL/uL Final   Hemoglobin 04/22/2021 11.9 (Christensen)  12.0 - 15.0 g/dL Final   HCT 60/45/4098 35.7 (Christensen)  36.0 - 46.0 % Final   MCV 04/22/2021 93.0  80.0 - 100.0 fL Final   MCH 04/22/2021 31.0  26.0 - 34.0 pg Final   MCHC 04/22/2021 33.3  30.0 - 36.0 g/dL Final   RDW 11/91/4782 13.2  11.5 - 15.5 % Final   Platelets 04/22/2021 471 (H)  150 - 400 K/uL Final   nRBC 04/22/2021 0.0  0.0 - 0.2 % Final   Neutrophils Relative % 04/22/2021 43  % Final   Neutro Abs 04/22/2021 4.7  1.7 - 7.7 K/uL Final   Lymphocytes Relative 04/22/2021 46  % Final   Lymphs Abs 04/22/2021 5.0 (H)  0.7 - 4.0 K/uL Final   Monocytes Relative 04/22/2021 7  % Final   Monocytes Absolute 04/22/2021 0.8  0.1 - 1.0 K/uL Final   Eosinophils Relative 04/22/2021 4  % Final   Eosinophils Absolute 04/22/2021 0.4  0.0 - 0.5 K/uL Final   Basophils Relative 04/22/2021 0  % Final   Basophils Absolute 04/22/2021 0.0  0.0 - 0.1 K/uL Final  nRBC 04/22/2021 0  0 /100 WBC Final   Abs Immature Granulocytes 04/22/2021 0.00  0.00 - 0.07 K/uL Final   Performed at [facility], [address], [place], [place] [address]   Sodium 04/22/2021 138  135 - 145 mmol/Christensen Final   Potassium 04/22/2021 4.3  3.5 - 5.1 mmol/Christensen Final   Chloride 04/22/2021 104  98 - 111 mmol/Christensen Final   CO2 04/22/2021 26  22 - 32 mmol/Christensen Final   Glucose, Bld 04/22/2021 117 (H)  70 - 99 mg/dL Final   Glucose reference range applies only to samples taken after fasting for at least 8 hours.   BUN 04/22/2021 25 (H)  6 - 20 mg/dL Final   Creatinine, Ser 04/22/2021 0.63  0.44 - 1.00 mg/dL Final   Calcium 62/13/0865 9.1  8.9 - 10.3 mg/dL Final   Total Protein 78/46/9629 6.6  6.5 - 8.1 g/dL Final   Albumin 52/84/1324 3.6  3.5 -  5.0 g/dL Final   AST 40/12/2723 51 (H)  15 - 41 U/Christensen Final   ALT 04/22/2021 46 (H)  0 - 44 U/Christensen Final   Alkaline Phosphatase 04/22/2021 67  38 - 126 U/Christensen Final   Total Bilirubin 04/22/2021 0.8  0.3 - 1.2 mg/dL Final   GFR, Estimated 04/22/2021 >60  >60 mL/min Final   Comment: (NOTE) Calculated using the CKD-EPI Creatinine Equation (2021)    Anion gap 04/22/2021 8  5 - 15 Final   Performed at [facility], [address] [place], [place] [address]   Hgb A1c MFr Bld 04/22/2021 5.1  4.8 - 5.6 % Final   Comment: (NOTE) Pre diabetes:          5.7%-6.4%  Diabetes:              >6.4%  Glycemic control for   <7.0% adults with diabetes    Mean Plasma Glucose 04/22/2021 99.67  mg/dL Final   Performed at [facility], [address] [place], [place] [address]   Alcohol, Ethyl (B) 04/22/2021 <10  <10 mg/dL Final   Comment: (NOTE) Lowest detectable limit for serum alcohol is 10 mg/dL.  For medical purposes only. Performed at [facility], [address] [place], [place] [address]    TSH 04/22/2021 0.401  0.350 - 4.500 uIU/mL Final   Comment: Performed by a 3rd Generation assay with a functional sensitivity of <=0.01 uIU/mL. Performed at [facility], [address] [place], [place] [address]    Preg Test, Ur 04/22/2021 NEGATIVE  NEGATIVE Final   Performed at [facility], [address], [place], [place] [address]   POC Amphetamine UR 04/22/2021 Positive (A)  NONE DETECTED (Cut Off Level 1000 ng/mL) Final   POC Secobarbital (BAR) 04/22/2021 None Detected  NONE DETECTED (Cut Off Level 300 ng/mL) Final   POC Buprenorphine (BUP) 04/22/2021 None Detected  NONE DETECTED (Cut Off Level 10 ng/mL) Final   POC Oxazepam (BZO) 04/22/2021 Positive (A)  NONE DETECTED (Cut Off Level 300 ng/mL) Final   POC Cocaine UR 04/22/2021 None Detected  NONE DETECTED (Cut Off Level 300 ng/mL) Final   POC Methamphetamine UR 04/22/2021 Positive (A)  NONE DETECTED (Cut Off Level 1000  ng/mL) Final   POC Morphine 04/22/2021 None Detected  NONE DETECTED (Cut Off Level 300 ng/mL) Final   POC Oxycodone UR 04/22/2021 None Detected  NONE DETECTED (Cut Off Level 100 ng/mL) Final   POC Methadone UR 04/22/2021 None Detected  NONE DETECTED (Cut Off Level 300 ng/mL) Final   POC Marijuana UR 04/22/2021 None Detected  NONE DETECTED (Cut Off Level 50 ng/mL) Final   SARSCOV2ONAVIRUS 2 AG 04/22/2021 NEGATIVE  NEGATIVE Final   Comment: (NOTE) SARS-CoV-2 antigen NOT DETECTED.   Negative results are presumptive.  Negative results do not preclude SARS-CoV-2 infection and should not be used as the sole basis for treatment or other patient management decisions, including infection  control decisions, particularly in the presence of clinical signs and  symptoms consistent with COVID-19, or in those who have been in contact with the virus.  Negative results must be combined with clinical observations, patient history, and epidemiological information. The expected result is Negative.  Fact Sheet for Patients: https://www.jennings-kim.com/  Fact Sheet for Healthcare Providers: https://alexander-rogers.biz/  This test is not yet approved or cleared by the [place] FDA and  has been authorized for detection and/or diagnosis of SARS-CoV-2 by FDA under an Emergency Use Authorization (EUA).  This EUA will remain in effect (meaning this test can be used) for the duration of  the COV                          ID-19 declaration under Section 564(b)(1) of the Act, 21 U.S.C. section 360bbb-3(b)(1), unless the authorization is terminated or revoked sooner.     Preg Test, Ur 04/22/2021 NEGATIVE  NEGATIVE Final   Comment:        THE SENSITIVITY OF THIS METHODOLOGY IS >24 mIU/mL    Cholesterol 04/22/2021 140  0 - 200 mg/dL Final   Triglycerides 16/12/9602 56  <150 mg/dL Final   HDL 54/11/8117 45  >40 mg/dL Final   Total CHOL/HDL Ratio 04/22/2021 3.1  RATIO Final    VLDL 04/22/2021 11  0 - 40 mg/dL Final   LDL Cholesterol 04/22/2021 84  0 - 99 mg/dL Final   Comment:        Total Cholesterol/HDL:CHD Risk Coronary Heart Disease Risk Table                     Men   Women  1/2 Average Risk   3.4   3.3  Average Risk       5.0   4.4  2 X Average Risk   9.6   7.1  3 X Average Risk  23.4   11.0        Use the calculated Patient Ratio above and the CHD Risk Table to determine the patient's CHD Risk.        ATP III CLASSIFICATION (LDL):  <100     mg/dL   Optimal  147-829  mg/dL   Near or Above                    Optimal  130-159  mg/dL   Borderline  562-130  mg/dL   High  >865     mg/dL   Very High Performed at [facility], [address] [place], [place] [address]   Admission on 04/21/2021, Discharged on 04/22/2021  Component Date Value Ref Range Status   SARS Coronavirus 2 by RT PCR 04/21/2021 NEGATIVE  NEGATIVE Final   Comment: (NOTE) SARS-CoV-2 target nucleic acids are NOT DETECTED.  The SARS-CoV-2 RNA is generally detectable in upper respiratory specimens during the acute phase of infection. The lowest concentration of SARS-CoV-2 viral copies this assay can detect is 138 copies/mL. A negative result does not preclude  SARS-Cov-2 infection and should not be used as the sole basis for treatment or other patient management decisions. A negative result may occur with  improper specimen collection/handling, submission of specimen other than nasopharyngeal swab, presence of viral mutation(s) within the areas targeted by this assay, and inadequate number of viral copies(<138 copies/mL). A negative result must be combined with clinical observations, patient history, and epidemiological information. The expected result is Negative.  Fact Sheet for Patients:  BloggerCourse.com  Fact Sheet for Healthcare Providers:  SeriousBroker.it  This test is no                          t yet approved  or cleared by the [place] FDA and  has been authorized for detection and/or diagnosis of SARS-CoV-2 by FDA under an Emergency Use Authorization (EUA). This EUA will remain  in effect (meaning this test can be used) for the duration of the COVID-19 declaration under Section 564(b)(1) of the Act, 21 U.S.C.section 360bbb-3(b)(1), unless the authorization is terminated  or revoked sooner.       Influenza A by PCR 04/21/2021 NEGATIVE  NEGATIVE Final   Influenza B by PCR 04/21/2021 NEGATIVE  NEGATIVE Final   Comment: (NOTE) The Xpert Xpress SARS-CoV-2/FLU/RSV plus assay is intended as an aid in the diagnosis of influenza from Nasopharyngeal swab specimens and should not be used as a sole basis for treatment. Nasal washings and aspirates are unacceptable for Xpert Xpress SARS-CoV-2/FLU/RSV testing.  Fact Sheet for Patients: BloggerCourse.com  Fact Sheet for Healthcare Providers: SeriousBroker.it  This test is not yet approved or cleared by the [place] FDA and has been authorized for detection and/or diagnosis of SARS-CoV-2 by FDA under an Emergency Use Authorization (EUA). This EUA will remain in effect (meaning this test can be used) for the duration of the COVID-19 declaration under Section 564(b)(1) of the Act, 21 U.S.C. section 360bbb-3(b)(1), unless the authorization is terminated or revoked.  Performed at [facility], [address] [place], [place] [address]    Sodium 04/21/2021 138  135 - 145 mmol/Christensen Final   Potassium 04/21/2021 4.2  3.5 - 5.1 mmol/Christensen Final   Chloride 04/21/2021 105  98 - 111 mmol/Christensen Final   CO2 04/21/2021 24  22 - 32 mmol/Christensen Final   Glucose, Bld 04/21/2021 103 (H)  70 - 99 mg/dL Final   Glucose reference range applies only to samples taken after fasting for at least 8 hours.   BUN 04/21/2021 33 (H)  6 - 20 mg/dL Final   Creatinine, Ser 04/21/2021 1.08 (H)  0.44 - 1.00 mg/dL  Final   Calcium 60/45/4098 10.4 (H)  8.9 - 10.3 mg/dL Final   Total Protein 11/91/4782 7.7  6.5 - 8.1 g/dL Final   Albumin 95/62/1308 4.4  3.5 - 5.0 g/dL Final   AST 65/78/4696 76 (H)  15 - 41 U/Christensen Final   ALT 04/21/2021 58 (H)  0 - 44 U/Christensen Final   Alkaline Phosphatase 04/21/2021 81  38 - 126 U/Christensen Final   Total Bilirubin 04/21/2021 1.1  0.3 - 1.2 mg/dL Final   GFR, Estimated 04/21/2021 >60  >60 mL/min Final   Comment: (NOTE) Calculated using the CKD-EPI Creatinine Equation (2021)    Anion gap 04/21/2021 9  5 - 15 Final   Performed at [facility], [address], [place], [place] [address]   Alcohol, Ethyl (B) 04/21/2021 <10  <10 mg/dL Final   Comment: (NOTE) Lowest  detectable limit for serum alcohol is 10 mg/dL.  For medical purposes only. Performed at [facility], [address], [place], [place] [address]    Opiates 04/21/2021 NONE DETECTED  NONE DETECTED Final   Cocaine 04/21/2021 NONE DETECTED  NONE DETECTED Final   Benzodiazepines 04/21/2021 POSITIVE (A)  NONE DETECTED Final   Amphetamines 04/21/2021 POSITIVE (A)  NONE DETECTED Final   Tetrahydrocannabinol 04/21/2021 NONE DETECTED  NONE DETECTED Final   Barbiturates 04/21/2021 NONE DETECTED  NONE DETECTED Final   Comment: (NOTE) DRUG SCREEN FOR MEDICAL PURPOSES ONLY.  IF CONFIRMATION IS NEEDED FOR ANY PURPOSE, NOTIFY LAB WITHIN 5 DAYS.  LOWEST DETECTABLE LIMITS FOR URINE DRUG SCREEN Drug Class                     Cutoff (ng/mL) Amphetamine and metabolites    1000 Barbiturate and metabolites    200 Benzodiazepine                 200 Tricyclics and metabolites     300 Opiates and metabolites        300 Cocaine and metabolites        300 THC                            50 Performed at [facility], [address] [place], [place] [address]    WBC 04/21/2021 11.3 (H)  4.0 - 10.5 K/uL Final   RBC 04/21/2021 3.95  3.87 - 5.11 MIL/uL Final   Hemoglobin 04/21/2021  12.4  12.0 - 15.0 g/dL Final   HCT 69/62/9528 36.2  36.0 - 46.0 % Final   MCV 04/21/2021 91.6  80.0 - 100.0 fL Final   MCH 04/21/2021 31.4  26.0 - 34.0 pg Final   MCHC 04/21/2021 34.3  30.0 - 36.0 g/dL Final   RDW 41/32/4401 13.4  11.5 - 15.5 % Final   Platelets 04/21/2021 472 (H)  150 - 400 K/uL Final   nRBC 04/21/2021 0.0  0.0 - 0.2 % Final   Neutrophils Relative % 04/21/2021 52  % Final   Neutro Abs 04/21/2021 5.8  1.7 - 7.7 K/uL Final   Lymphocytes Relative 04/21/2021 39  % Final   Lymphs Abs 04/21/2021 4.4 (H)  0.7 - 4.0 K/uL Final   Monocytes Relative 04/21/2021 8  % Final   Monocytes Absolute 04/21/2021 0.9  0.1 - 1.0 K/uL Final   Eosinophils Relative 04/21/2021 1  % Final   Eosinophils Absolute 04/21/2021 0.1  0.0 - 0.5 K/uL Final   Basophils Relative 04/21/2021 0  % Final   Basophils Absolute 04/21/2021 0.1  0.0 - 0.1 K/uL Final   Immature Granulocytes 04/21/2021 0  % Final   Abs Immature Granulocytes 04/21/2021 0.02  0.00 - 0.07 K/uL Final   Reactive, Benign Lymphocytes 04/21/2021 PRESENT   Final   Performed at [facility], [address] [place], [place] [address]  Admission on 03/25/2021, Discharged on 03/26/2021  Component Date Value Ref Range Status   Sodium 03/25/2021 136  135 - 145 mmol/Christensen Final   Potassium 03/25/2021 3.3 (Christensen)  3.5 - 5.1 mmol/Christensen Final   Chloride 03/25/2021 103  98 - 111 mmol/Christensen Final   CO2 03/25/2021 24  22 - 32 mmol/Christensen Final   Glucose, Bld 03/25/2021 111 (H)  70 - 99 mg/dL Final   Glucose reference range applies only to samples taken after fasting for at  least 8 hours.   BUN 03/25/2021 23 (H)  6 - 20 mg/dL Final   Creatinine, Ser 03/25/2021 0.85  0.44 - 1.00 mg/dL Final   Calcium 16/10/960412/29/2022 9.3  8.9 - 10.3 mg/dL Final   Total Protein 54/09/811912/29/2022 8.3 (H)  6.5 - 8.1 g/dL Final   Albumin 14/78/295612/29/2022 4.6  3.5 - 5.0 g/dL Final   AST 21/30/865712/29/2022 62 (H)  15 - 41 U/Christensen Final   ALT 03/25/2021 72 (H)  0 - 44 U/Christensen Final   Alkaline Phosphatase  03/25/2021 90  38 - 126 U/Christensen Final   Total Bilirubin 03/25/2021 0.4  0.3 - 1.2 mg/dL Final   GFR, Estimated 03/25/2021 >60  >60 mL/min Final   Comment: (NOTE) Calculated using the CKD-EPI Creatinine Equation (2021)    Anion gap 03/25/2021 9  5 - 15 Final   Performed at [facility], [address] [place], [place] [address]   Alcohol, Ethyl (B) 03/25/2021 <10  <10 mg/dL Final   Comment: (NOTE) Lowest detectable limit for serum alcohol is 10 mg/dL.  For medical purposes only. Performed at [facility], [address] [place], [place] [address]    Salicylate Lvl 03/25/2021 <7.0 (Christensen)  7.0 - 30.0 mg/dL Final   Performed at [facility], [address] [place], [place] [address]   Acetaminophen (Tylenol), Serum 03/25/2021 <10 (Christensen)  10 - 30 ug/mL Final   Comment: (NOTE) Therapeutic concentrations vary significantly. A range of 10-30 ug/mL  may be an effective concentration for many patients. However, some  are best treated at concentrations outside of this range. Acetaminophen concentrations >150 ug/mL at 4 hours after ingestion  and >50 ug/mL at 12 hours after ingestion are often associated with  toxic reactions.  Performed at [facility], [address] [place], [place] [address]    WBC 03/25/2021 10.6 (H)  4.0 - 10.5 K/uL Final   RBC 03/25/2021 4.45  3.87 - 5.11 MIL/uL Final   Hemoglobin 03/25/2021 14.6  12.0 - 15.0 g/dL Final   HCT 72/53/664412/29/2022 43.2  36.0 - 46.0 % Final   MCV 03/25/2021 97.1  80.0 - 100.0 fL Final   MCH 03/25/2021 32.8  26.0 - 34.0 pg Final   MCHC 03/25/2021 33.8  30.0 - 36.0 g/dL Final   RDW 03/47/425912/29/2022 13.7  11.5 - 15.5 % Final   Platelets 03/25/2021 542 (H)  150 - 400 K/uL Final   nRBC 03/25/2021 0.0  0.0 - 0.2 % Final   Performed at [facility], [address] [place], [place] [address]   Opiates 03/26/2021 NONE DETECTED  NONE DETECTED Final   Cocaine 03/26/2021 POSITIVE (A)  NONE DETECTED Final   Benzodiazepines 03/26/2021 NONE DETECTED  NONE DETECTED Final    Amphetamines 03/26/2021 POSITIVE (A)  NONE DETECTED Final   Tetrahydrocannabinol 03/26/2021 POSITIVE (A)  NONE DETECTED Final   Barbiturates 03/26/2021 NONE DETECTED  NONE DETECTED Final   Comment: (NOTE) DRUG SCREEN FOR MEDICAL PURPOSES ONLY.  IF CONFIRMATION IS NEEDED FOR ANY PURPOSE, NOTIFY LAB WITHIN 5 DAYS.  LOWEST DETECTABLE LIMITS FOR URINE DRUG SCREEN Drug Class                     Cutoff (ng/mL) Amphetamine and metabolites    1000 Barbiturate and metabolites    200 Benzodiazepine                 200 Tricyclics and metabolites     300 Opiates and metabolites  300 Cocaine and metabolites        300 THC                            50 Performed at [facility], [address], [place], [place] [address]    Preg Test, Ur 03/26/2021 Negative  Negative Final   SARS Coronavirus 2 by RT PCR 03/26/2021 NEGATIVE  NEGATIVE Final   Comment: (NOTE) SARS-CoV-2 target nucleic acids are NOT DETECTED.  The SARS-CoV-2 RNA is generally detectable in upper respiratory specimens during the acute phase of infection. The lowest concentration of SARS-CoV-2 viral copies this assay can detect is 138 copies/mL. A negative result does not preclude SARS-Cov-2 infection and should not be used as the sole basis for treatment or other patient management decisions. A negative result may occur with  improper specimen collection/handling, submission of specimen other than nasopharyngeal swab, presence of viral mutation(s) within the areas targeted by this assay, and inadequate number of viral copies(<138 copies/mL). A negative result must be combined with clinical observations, patient history, and epidemiological information. The expected result is Negative.  Fact Sheet for Patients:  BloggerCourse.com  Fact Sheet for Healthcare Providers:  SeriousBroker.it  This test is no                          t yet approved or cleared by the [place]  FDA and  has been authorized for detection and/or diagnosis of SARS-CoV-2 by FDA under an Emergency Use Authorization (EUA). This EUA will remain  in effect (meaning this test can be used) for the duration of the COVID-19 declaration under Section 564(b)(1) of the Act, 21 U.S.C.section 360bbb-3(b)(1), unless the authorization is terminated  or revoked sooner.       Influenza A by PCR 03/26/2021 NEGATIVE  NEGATIVE Final   Influenza B by PCR 03/26/2021 NEGATIVE  NEGATIVE Final   Comment: (NOTE) The Xpert Xpress SARS-CoV-2/FLU/RSV plus assay is intended as an aid in the diagnosis of influenza from Nasopharyngeal swab specimens and should not be used as a sole basis for treatment. Nasal washings and aspirates are unacceptable for Xpert Xpress SARS-CoV-2/FLU/RSV testing.  Fact Sheet for Patients: BloggerCourse.com  Fact Sheet for Healthcare Providers: SeriousBroker.it  This test is not yet approved or cleared by the [place] FDA and has been authorized for detection and/or diagnosis of SARS-CoV-2 by FDA under an Emergency Use Authorization (EUA). This EUA will remain in effect (meaning this test can be used) for the duration of the COVID-19 declaration under Section 564(b)(1) of the Act, 21 U.S.C. section 360bbb-3(b)(1), unless the authorization is terminated or revoked.  Performed at [facility], [address] [place], [place] [address]    Preg Test, Ur 03/26/2021 NEGATIVE  NEGATIVE Final   Comment:        THE SENSITIVITY OF THIS METHODOLOGY IS >20 mIU/mL. Performed at [facility], [address], [place], [place] [address]    Color, Urine 03/26/2021 AMBER (A)  YELLOW Final   BIOCHEMICALS MAY BE AFFECTED BY COLOR   APPearance 03/26/2021 TURBID (A)  CLEAR Final   Specific Gravity, Urine 03/26/2021 1.023  1.005 - 1.030 Final   pH 03/26/2021 5.0  5.0 - 8.0 Final   Glucose, UA 03/26/2021 NEGATIVE  NEGATIVE mg/dL Final    Hgb urine dipstick 03/26/2021 NEGATIVE  NEGATIVE Final   Bilirubin Urine 03/26/2021 NEGATIVE  NEGATIVE Final   Ketones, ur 03/26/2021 20 (  A)  NEGATIVE mg/dL Final   Protein, ur 16/12/9602 NEGATIVE  NEGATIVE mg/dL Final   Nitrite 54/11/8117 NEGATIVE  NEGATIVE Final   Leukocytes,Ua 03/26/2021 NEGATIVE  NEGATIVE Final   RBC / HPF 03/26/2021 0-5  0 - 5 RBC/hpf Final   WBC, UA 03/26/2021 21-50  0 - 5 WBC/hpf Final   Bacteria, UA 03/26/2021 RARE (A)  NONE SEEN Final   Squamous Epithelial / LPF 03/26/2021 0-5  0 - 5 Final   WBC Clumps 03/26/2021 PRESENT   Final   Mucus 03/26/2021 PRESENT   Final   Budding Yeast 03/26/2021 PRESENT   Final   Hyaline Casts, UA 03/26/2021 PRESENT   Final   Performed at [facility], [address] [place], [place] [address]    Blood Alcohol level:  Lab Results  Component Value Date   [facility] <10 04/22/2021   ETH <10 04/21/2021    Metabolic Disorder Labs: Lab Results  Component Value Date   HGBA1C 5.1 04/22/2021   MPG 99.67 04/22/2021   MPG 105.41 11/10/2017   No results found for: PROLACTIN Lab Results  Component Value Date   CHOL 140 04/22/2021   TRIG 56 04/22/2021   HDL 45 04/22/2021   CHOLHDL 3.1 04/22/2021   VLDL 11 04/22/2021   LDLCALC 84 04/22/2021   LDLCALC 64 11/10/2017    Therapeutic Lab Levels: No results found for: LITHIUM No results found for: VALPROATE No components found for:  CBMZ  Physical Findings   AIMS    Flowsheet Row [facility]  AIMS Total Score 0      AUDIT    Flowsheet Row [facility]  Alcohol Use Disorder Identification Test Final Score (AUDIT) 5      PHQ2-9    Flowsheet Row Office Visit from 11/19/2015 in [facility] Office Visit from 08/26/2015 in [facility] Office Visit from 05[facility]26/2015 in [facility] Office Visit from 12/10/2014 in [facility] Office Visit from 11/17/2014 in [facility]  PHQ-2 Total Score 0 2 0 0 0  PHQ-9 Total Score -- 13 -- -- --      Flowsheet Row ED from 04/22/2021 in [facility] ED from 04/21/2021 in [facility] from 03/25/2021 in [facility] EM[facility]GENCY DEPART[facility]  C-SSRS RISK CATEGORY Low Risk No Risk Moderate Risk        Musculoskeletal  Strength & Muscle Tone: within normal limits Gait & Station: normal Patient leans: N/A  Psychiatric Specialty Exam  Presentation  General Appearance: Disheveled  Eye Contact:Fair  Speech:Clear and Coherent; Normal Rate  Speech Volume:Normal  Handedness:Right   Mood and Affect  Mood:Anxious; Depressed; Hopeless  Affect:Tearful; Depressed; Congruent   Thought Process  Thought Processes:Coherent  Descriptions of Associations:Intact  Orientation:Full (Time, Place and Person)  Thought Content:Logical  Diagnosis of Schizophrenia or Schizoaffective disorder in past: No  Duration of Psychotic Symptoms: Less than six months   Hallucinations:Hallucinations: Auditory Description of Auditory Hallucinations: hears voicesbut cant describe. she cant hear what they are saying  Ideas of Reference:None  Suicidal Thoughts:Suicidal Thoughts: Yes, Passive SI Active Intent and/or Plan: Without Intent; Without Plan; Without Means to Carry Out  Homicidal Thoughts:Homicidal Thoughts: No   Sensorium  Memory:Immediate Good; Remote Good; Recent Good  Judgment:Fair  Insight:Fair   Executive Functions  Concentration:Good  Attention Span:Good  Recall:Good  Fund of Knowledge:Good  Language:Good   Psychomotor Activity  Psychomotor Activity:Psychomotor Activity: Normal   Assets  Assets:Communication Skills; Desire for Improvement; Financial Resources/Insurance; Leisure Time;  Physical Health   Sleep  Sleep:Sleep: Fair   No data recorded  Physical Exam  Physical Exam Vitals and nursing note reviewed.  Constitutional:      General: She is not in acute distress.    Appearance: She is well-developed.  HENT:     Head: Normocephalic and atraumatic.  Eyes:     General:        Right eye: No discharge.        Left eye: No discharge.     Conjunctiva/sclera: Conjunctivae normal.  Cardiovascular:     Rate and Rhythm: Normal rate.  Pulmonary:     Effort: Pulmonary effort is normal. No respiratory distress.     Breath sounds: Normal breath sounds.  Musculoskeletal:        General: No swelling or tenderness. Normal range of motion.     Cervical back: Normal range of motion.  Skin:    General: Skin is warm and dry.     Coloration: Skin is not jaundiced or pale.  Neurological:     Mental Status: She is alert and oriented to person, place, and time.  Psychiatric:        Attention and Perception: Attention normal. She perceives auditory hallucinations.        Mood and Affect: Mood is anxious and depressed. Affect is tearful.        Speech: Speech normal.        Behavior: Behavior normal. Behavior is cooperative.        Thought Content: Thought content includes suicidal ideation. Thought content does not include suicidal plan.        Cognition and Memory: Cognition normal.        Judgment: Judgment is impulsive.   Review of Systems  Constitutional: Negative.   HENT: Negative.    Eyes: Negative.   Respiratory: Negative.    Cardiovascular: Negative.   Genitourinary: Negative.   Musculoskeletal: Negative.   Skin: Negative.   Neurological: Negative.   Psychiatric/Behavioral:  Positive for depression, hallucinations, substance abuse and suicidal ideas. The patient is nervous/anxious.   Blood pressure 111/85, pulse 78, temperature 97.7 F (36.5 C), temperature source Tympanic, resp. rate 16, SpO2 99 %. There is no height or weight on file to calculate  BMI.  Treatment Plan Summary: Daily contact with patient to assess and evaluate symptoms and progress in treatment and Medication management  Disposition: Ongoing. Patient continues to meet treatment criteria for the [facility].  Patient is interested in residential substance abuse treatment.  Social worker will be notified.   Ardis Hughs, NP 04/24/2021 11:55 AM

## 2021-04-24 NOTE — ED Notes (Signed)
Pt is currently sleeping, no distress noted, environmental check complete, will continue to monitor patient for safety. ? ?

## 2021-04-24 NOTE — ED Notes (Signed)
Pt lying in bed crying upon entering pt's room. Pt states, "I just have so much going on. I lost my mom 3 months ago and she was my only support. I have grown children that I don't have contact with. I have nowhere to go. I want to get help for this addiction. But I only used because I was so depressed and wanted to forget about my problems. Seems like it only made things worse (crying). I just don't know why I'm still here (alive)". Pt endorse passive SI no plan. Support given. Pt request rehab after discharge. Pt states, "I just want a life, not this". Encouragement provided. Showed pt coping skills listed in admission folder on pt's stand in room. Encouraged pt to review list provided. Informed pt to notify staff with any needs or concerns. Medication provided. Safety maintained.

## 2021-04-24 NOTE — ED Notes (Signed)
Refused to eat and refused to participate in group activities

## 2021-04-24 NOTE — ED Notes (Signed)
Patient continues to rest with no distress noted - will continue to monitor for safety

## 2021-04-24 NOTE — ED Notes (Signed)
Pt resting in bed in no acute distress. RR even and unlabored. Safety maintained. 

## 2021-04-25 DIAGNOSIS — F4310 Post-traumatic stress disorder, unspecified: Secondary | ICD-10-CM | POA: Diagnosis not present

## 2021-04-25 DIAGNOSIS — F19951 Other psychoactive substance use, unspecified with psychoactive substance-induced psychotic disorder with hallucinations: Secondary | ICD-10-CM | POA: Diagnosis not present

## 2021-04-25 DIAGNOSIS — F1510 Other stimulant abuse, uncomplicated: Secondary | ICD-10-CM | POA: Diagnosis not present

## 2021-04-25 DIAGNOSIS — Z20822 Contact with and (suspected) exposure to covid-19: Secondary | ICD-10-CM | POA: Diagnosis not present

## 2021-04-25 NOTE — ED Notes (Signed)
Pt is currently sleeping, no distress noted, environmental check complete, will continue to monitor patient for safety. ? ?

## 2021-04-25 NOTE — ED Notes (Signed)
Patient refused Group at 10:00 am and 12:00 pm said she did not feel up to it now, said she is still trying to come out of her shell and she needs more time. Explained to her what the group is about. Self Care and Dimensions of Wellness, it also covers Goal Setting, I gave her the paperwork with assignments she can do advised her to return to me by the end of the day. I will then return it to her to keep and refer to when needed.

## 2021-04-25 NOTE — ED Notes (Signed)
Pt ate dinner in no acute distress. Voiced her appreciation for the staff service. Safety maintained.

## 2021-04-25 NOTE — ED Notes (Signed)
Vistaril effective in relieving anxiety. Pt laying on bed in on no acute distress. Resting with eyes closed.

## 2021-04-25 NOTE — ED Notes (Addendum)
Pt came to nurses station stating, "I need a Vistaril because I keep hearing voices and seeing shadows that I know aren't there. I don't know if the voices are coming from out here or in my head". Vistaril was given with question. RN asked if Vistaril help with the voices and pt states, "I'm not sure. It help me calm down and sleep". Vistaril given. Pt returned to room. Informed pt to notify staff with any needs or concerns. Safety maintained.

## 2021-04-25 NOTE — ED Notes (Signed)
Pt sleeping in no acute distress. RR even and unlabored. Safety maintained. 

## 2021-04-25 NOTE — ED Notes (Signed)
Patient refused to come for breakfast stated not hungry.

## 2021-04-25 NOTE — ED Notes (Signed)
Pt is in the bed sleeping at present. Respirations are even and unlabored. No acute distress noted. Will continue to monitor for safety. °

## 2021-04-25 NOTE — ED Provider Notes (Signed)
Behavioral Health Progress Note  Date and Time: 04/25/2021 10:07 AM Name: Melanie Reveringmanda L Christensen MRN:  [id number]  Subjective:  [name], 46 y.o., female who initially presented with to [facility] with law enforcement due to SI and AVH on 04/22/2021.  She was admitted to the continuous assessment unit for overnight observation.  She was reassessed and was recommended for admission to the facility base crisis unit for stabilization. Patient seen face to face by this provider, and consulted with Dr. Lucianne MussKumar; and  chart reviewed on 04/25/21.  Per chart review patient has a history of depression, anxiety, history, substance-induced mood disorder and methamphetamine abuse. UDS on admission positive for Methamphetamine, amphetamine, and benzodiazepines. BAL negative <10. She does not have outpatient psychiatric services in place.     Patient is laying in her bed. She is crying upon assessment. She is cooperative, alert/oriented x 4. She is disheveled and makes fair eye contact.  She continues to endorse depression and anxiety. States, "I just need help, I want someone to help me". She endorses feelings of helplessness, hopelessness, decreased energy, no motivation, and decrease focus.  Per nursing she spends most of her day in the bed and she has not showered while she has been on the unit. She is only eating dinner, due to a decreased appetite. When asked if she is sleeping she states, "I don't know". She is not attending group sessions. She does not appear to be responding to internal/external stimuli. She denies AVH. She denies SI with intent or plan, but states "I don't care if I die". When asked if she can contract for safety she states, "I don't know". Patients feelings and progress discussed.  Reassurance, support, and encouragement provided. She is tolerating Zyprexa, Gabapentin and Lexapro with out any adverse reactions.   Will continue to monitor patient.Patient has not progressed while on the unit. Patient  will be assessed daily, if she continues to make no progress a higher level of care may need to be considered.   Diagnosis:  Final diagnoses:  PTSD (post-traumatic stress disorder)  Methamphetamine abuse (HCC)  Substance induced mood disorder (HCC)  Substance-induced psychotic disorder with hallucinations (HCC)    Total Time spent with patient: 30 minutes  Past Psychiatric History: see h&p Past Medical History:  Past Medical History:  Diagnosis Date   Anxiety    Arthritis    Asthma    last flareup was yr or so with bronchitis   Bipolar disorder (HCC)    Carpal tunnel syndrome    COPD (chronic obstructive pulmonary disease) (HCC)    Depressed    Dysrhythmia    'skipped beats every now and then"   Emphysema    does smoke, and has slowed down   Fibromyalgia    Fibromyalgia, primary    GERD (gastroesophageal reflux disease)    periodic indigestion   Hepatitis C    Hypertension    dx a couple of months ago   Kidney stone    Manic depressive disorder (HCC)    Nephrolithiasis    PTSD (post-traumatic stress disorder)    UTI (lower urinary tract infection)     Past Surgical History:  Procedure Laterality Date   ABDOMINAL HYSTERECTOMY     ANTERIOR CERVICAL DECOMP/DISCECTOMY FUSION N/A 04/22/2015   Procedure: Cervical 4-5, Cervical 5-6 Anterior Cervical Discectomy and Fusion, Allograft, Plate;  Surgeon: Eldred MangesMark C Yates, MD;  Location: [facility] OR;  Service: Orthopedics;  Laterality: N/A;   CARPAL TUNNEL RELEASE  right hand   ORIF ANKLE FRACTURE  02/03/2012   Procedure: OPEN REDUCTION INTERNAL FIXATION (ORIF) ANKLE FRACTURE;  Surgeon: Nadara Mustard, MD;  Location: [facility] OR;  Service: Orthopedics;  Laterality: Right;  Open Reduction Internal Fixation Right Ankle   ORIF CLAVICULAR FRACTURE Left 08/29/2012   Dr Ophelia Charter   ORIF CLAVICULAR FRACTURE Left 08/29/2012   Procedure: OPEN REDUCTION INTERNAL FIXATION (ORIF) CLAVICULAR FRACTURE;  Surgeon: Eldred Manges, MD;  Location: [facility] OR;  Service:  Orthopedics;  Laterality: Left;  Open Reduction Internal Fixation Left Clavicle Fracture   SPLENECTOMY, 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   Procedure: Splenectomy;  Surgeon: Kinsinger, De Blanch, MD;  Location: [facility] OR;  Service: General;;   TUBAL LIGATION     WISDOM TOOTH EXTRACTION     Family History:  Family History  Problem Relation Age of Onset   Alcohol abuse Father    Cirrhosis Father    Cancer Father    Heart attack Mother    Hypertension Mother    Hyperthyroidism Sister    Bipolar disorder Sister    Bipolar disorder Sister    Kidney disease Brother    Bipolar disorder Brother    Family Psychiatric  History: See H&p Social History:  Social History   Substance and Sexual Activity  Alcohol Use Yes   Comment: occ mixed drink or liquor     Social History   Substance and Sexual Activity  Drug Use Not Currently   Types: Marijuana   Comment: last marijuana use 05/27/2019    Social History   Socioeconomic History   Marital status: Single    Spouse name: Not on file   Number of children: 4   Years of education: 8th   Highest education level: Not on file  Occupational History    Employer: [employer]    Comment: [employer]  Tobacco Use   Smoking status: Every Day    Packs/day: 1.00    Years: 28.00    Pack years: 28.00    Types: Cigarettes    Start date: 03/28/1990   Smokeless tobacco: Never  Vaping Use   Vaping Use: Never used  Substance and Sexual Activity   Alcohol use: Yes    Comment: occ mixed drink or liquor   Drug use: Not Currently    Types: Marijuana    Comment: last marijuana use 05/27/2019   Sexual activity: Not on file  Other Topics Concern   Not on file  Social History Narrative   ** Merged History Encounter **       As of 02/20/12:  Completed some high school. Has a fiance Conley Rolls) who brings her to appointments. Unemployed. No regular exercise. Feels safe in her relationships  Has smoked cigarettes since she was 46 years old, currently doing  1/2 to 1 pack    every two days. Denies recreational drug use currently or a history of ever using IV drugs. Occasionally drinks a couple drinks about twice per week. There was a time where she drank 2-3 drinks either every day or every other day for about three years du   ring 2009-2012.  Note: after reviewing prior records, patient has had multiple admissions to behavioral health, several of which mention alcohol abuse, and at least one of which was for alcohol detoxification. Patient did not share this information duri   ng our visit.  Has four children (ages 60, 71, 31, 72). The 6 year old lives with pt's sister in [place] so that they can  continue to go to school at the same place. The 45 year old recently moved in with her two months ago, but had previously    been living with pt's sister as well (moved in as there was conflict between child & pt's sister). The other two kids are adults and live on their own.  Caffeine Use: 1-2 cups daily   Social Determinants of Health   Financial Resource Strain: Not on file  Food Insecurity: Not on file  Transportation Needs: Not on file  Physical Activity: Not on file  Stress: Not on file  Social Connections: Not on file   SDOH:  SDOH Screenings   Alcohol Screen: Not on file  Depression (PHQ2-9): Not on file  Financial Resource Strain: Not on file  Food Insecurity: Not on file  Housing: Not on file  Physical Activity: Not on file  Social Connections: Not on file  Stress: Not on file  Tobacco Use: High Risk   Smoking Tobacco Use: Every Day   Smokeless Tobacco Use: Never   Passive Exposure: Not on file  Transportation Needs: Not on file   Additional Social History:       Sleep: Poor  Appetite:  Poor  Current Medications:  Current Facility-Administered Medications  Medication Dose Route Frequency Provider Last Rate Last Admin   acetaminophen (TYLENOL) tablet 650 mg  650 mg Oral Q6H PRN Jackelyn Poling, NP       alum &  mag hydroxide-simeth (MAALOX/MYLANTA) 200-200-20 MG/5ML suspension 30 mL  30 mL Oral Q4H PRN Nira Conn A, NP       escitalopram (LEXAPRO) tablet 10 mg  10 mg Oral Daily Nira Conn A, NP   10 mg at 04/25/21 1002   gabapentin (NEURONTIN) capsule 300 mg  300 mg Oral TID Nira Conn A, NP   300 mg at 04/25/21 1002   hydrOXYzine (ATARAX) tablet 25 mg  25 mg Oral TID PRN Jackelyn Poling, NP   25 mg at 04/24/21 1636   magnesium hydroxide (MILK OF MAGNESIA) suspension 30 mL  30 mL Oral Daily PRN Jackelyn Poling, NP       OLANZapine (ZYPREXA) tablet 5 mg  5 mg Oral QHS Nira Conn A, NP   5 mg at 04/24/21 2139   traZODone (DESYREL) tablet 50 mg  50 mg Oral QHS PRN Jackelyn Poling, NP       Current Outpatient Medications  Medication Sig Dispense Refill   benztropine (COGENTIN) 1 MG tablet Take 1 mg by mouth 2 (two) times daily.     escitalopram (LEXAPRO) 10 MG tablet Take 10 mg by mouth at bedtime.     gabapentin (NEURONTIN) 300 MG capsule Take 1 capsule (300 mg total) by mouth 3 (three) times daily as needed. 60 capsule 0   hydrOXYzine (VISTARIL) 50 MG capsule Take 50 mg by mouth 2 (two) times daily.     lamoTRIgine (LAMICTAL) 25 MG tablet Take 25 mg by mouth daily.      Labs  Lab Results:  Admission on 04/22/2021  Component Date Value Ref Range Status   SARS Coronavirus 2 by RT PCR 04/22/2021 NEGATIVE  NEGATIVE Final   Comment: (NOTE) SARS-CoV-2 target nucleic acids are NOT DETECTED.  The SARS-CoV-2 RNA is generally detectable in upper respiratory specimens during the acute phase of infection. The lowest concentration of SARS-CoV-2 viral copies this assay can detect is 138 copies/mL. A negative result does not preclude SARS-Cov-2 infection and should not be used as the sole  basis for treatment or other patient management decisions. A negative result may occur with  improper specimen collection/handling, submission of specimen other than nasopharyngeal swab, presence of viral mutation(s)  within the areas targeted by this assay, and inadequate number of viral copies(<138 copies/mL). A negative result must be combined with clinical observations, patient history, and epidemiological information. The expected result is Negative.  Fact Sheet for Patients:  BloggerCourse.com  Fact Sheet for Healthcare Providers:  SeriousBroker.it  This test is no                          t yet approved or cleared by the [place] FDA and  has been authorized for detection and/or diagnosis of SARS-CoV-2 by FDA under an Emergency Use Authorization (EUA). This EUA will remain  in effect (meaning this test can be used) for the duration of the COVID-19 declaration under Section 564(b)(1) of the Act, 21 U.S.C.section 360bbb-3(b)(1), unless the authorization is terminated  or revoked sooner.       Influenza A by PCR 04/22/2021 NEGATIVE  NEGATIVE Final   Influenza B by PCR 04/22/2021 NEGATIVE  NEGATIVE Final   Comment: (NOTE) The Xpert Xpress SARS-CoV-2/FLU/RSV plus assay is intended as an aid in the diagnosis of influenza from Nasopharyngeal swab specimens and should not be used as a sole basis for treatment. Nasal washings and aspirates are unacceptable for Xpert Xpress SARS-CoV-2/FLU/RSV testing.  Fact Sheet for Patients: BloggerCourse.com  Fact Sheet for Healthcare Providers: SeriousBroker.it  This test is not yet approved or cleared by the [place] FDA and has been authorized for detection and/or diagnosis of SARS-CoV-2 by FDA under an Emergency Use Authorization (EUA). This EUA will remain in effect (meaning this test can be used) for the duration of the COVID-19 declaration under Section 564(b)(1) of the Act, 21 U.S.C. section 360bbb-3(b)(1), unless the authorization is terminated or revoked.  Performed at [facility], [address] [place], [place] [address]     SARS Coronavirus 2 Ag 04/22/2021 Negative  Negative Preliminary   WBC 04/22/2021 10.9 (H)  4.0 - 10.5 K/uL Final   RBC 04/22/2021 3.84 (L)  3.87 - 5.11 MIL/uL Final   Hemoglobin 04/22/2021 11.9 (L)  12.0 - 15.0 g/dL Final   HCT 57/84/6962 35.7 (L)  36.0 - 46.0 % Final   MCV 04/22/2021 93.0  80.0 - 100.0 fL Final   MCH 04/22/2021 31.0  26.0 - 34.0 pg Final   MCHC 04/22/2021 33.3  30.0 - 36.0 g/dL Final   RDW 95/28/4132 13.2  11.5 - 15.5 % Final   Platelets 04/22/2021 471 (H)  150 - 400 K/uL Final   nRBC 04/22/2021 0.0  0.0 - 0.2 % Final   Neutrophils Relative % 04/22/2021 43  % Final   Neutro Abs 04/22/2021 4.7  1.7 - 7.7 K/uL Final   Lymphocytes Relative 04/22/2021 46  % Final   Lymphs Abs 04/22/2021 5.0 (H)  0.7 - 4.0 K/uL Final   Monocytes Relative 04/22/2021 7  % Final   Monocytes Absolute 04/22/2021 0.8  0.1 - 1.0 K/uL Final   Eosinophils Relative 04/22/2021 4  % Final   Eosinophils Absolute 04/22/2021 0.4  0.0 - 0.5 K/uL Final   Basophils Relative 04/22/2021 0  % Final   Basophils Absolute 04/22/2021 0.0  0.0 - 0.1 K/uL Final   nRBC 04/22/2021 0  0 /100 WBC Final   Abs Immature Granulocytes 04/22/2021 0.00  0.00 - 0.07 K/uL Final  Performed at [facility], [address], [place], [place] [address]   Sodium 04/22/2021 138  135 - 145 mmol/L Final   Potassium 04/22/2021 4.3  3.5 - 5.1 mmol/L Final   Chloride 04/22/2021 104  98 - 111 mmol/L Final   CO2 04/22/2021 26  22 - 32 mmol/L Final   Glucose, Bld 04/22/2021 117 (H)  70 - 99 mg/dL Final   Glucose reference range applies only to samples taken after fasting for at least 8 hours.   BUN 04/22/2021 25 (H)  6 - 20 mg/dL Final   Creatinine, Ser 04/22/2021 0.63  0.44 - 1.00 mg/dL Final   Calcium 60/45/4098 9.1  8.9 - 10.3 mg/dL Final   Total Protein 11/91/4782 6.6  6.5 - 8.1 g/dL Final   Albumin 95/62/1308 3.6  3.5 - 5.0 g/dL Final   AST 65/78/4696 51 (H)  15 - 41 U/L Final   ALT 04/22/2021 46 (H)  0 - 44 U/L Final    Alkaline Phosphatase 04/22/2021 67  38 - 126 U/L Final   Total Bilirubin 04/22/2021 0.8  0.3 - 1.2 mg/dL Final   GFR, Estimated 04/22/2021 >60  >60 mL/min Final   Comment: (NOTE) Calculated using the CKD-EPI Creatinine Equation (2021)    Anion gap 04/22/2021 8  5 - 15 Final   Performed at [facility], [address] [place], [place] [address]   Hgb A1c MFr Bld 04/22/2021 5.1  4.8 - 5.6 % Final   Comment: (NOTE) Pre diabetes:          5.7%-6.4%  Diabetes:              >6.4%  Glycemic control for   <7.0% adults with diabetes    Mean Plasma Glucose 04/22/2021 99.67  mg/dL Final   Performed at [facility], [address], [place], [place] [address]   Alcohol, Ethyl (B) 04/22/2021 <10  <10 mg/dL Final   Comment: (NOTE) Lowest detectable limit for serum alcohol is 10 mg/dL.  For medical purposes only. Performed at [facility], [address], [place], [place] [address]    TSH 04/22/2021 0.401  0.350 - 4.500 uIU/mL Final   Comment: Performed by a 3rd Generation assay with a functional sensitivity of <=0.01 uIU/mL. Performed at [facility], [address], [place], [place] [address]    Preg Test, Ur 04/22/2021 NEGATIVE  NEGATIVE Final   Performed at [facility], [address] [place], [place] [address]   POC Amphetamine UR 04/22/2021 Positive (A)  NONE DETECTED (Cut Off Level 1000 ng/mL) Final   POC Secobarbital (BAR) 04/22/2021 None Detected  NONE DETECTED (Cut Off Level 300 ng/mL) Final   POC Buprenorphine (BUP) 04/22/2021 None Detected  NONE DETECTED (Cut Off Level 10 ng/mL) Final   POC Oxazepam (BZO) 04/22/2021 Positive (A)  NONE DETECTED (Cut Off Level 300 ng/mL) Final   POC Cocaine UR 04/22/2021 None Detected  NONE DETECTED (Cut Off Level 300 ng/mL) Final   POC Methamphetamine UR 04/22/2021 Positive (A)  NONE DETECTED (Cut Off Level 1000 ng/mL) Final   POC Morphine 04/22/2021 None Detected  NONE DETECTED (Cut Off Level 300 ng/mL) Final   POC  Oxycodone UR 04/22/2021 None Detected  NONE DETECTED (Cut Off Level 100 ng/mL) Final   POC Methadone UR 04/22/2021 None Detected  NONE DETECTED (Cut Off Level 300 ng/mL) Final   POC Marijuana UR 04/22/2021 None Detected  NONE DETECTED (Cut Off Level  50 ng/mL) Final   SARSCOV2ONAVIRUS 2 AG 04/22/2021 NEGATIVE  NEGATIVE Final   Comment: (NOTE) SARS-CoV-2 antigen NOT DETECTED.   Negative results are presumptive.  Negative results do not preclude SARS-CoV-2 infection and should not be used as the sole basis for treatment or other patient management decisions, including infection  control decisions, particularly in the presence of clinical signs and  symptoms consistent with COVID-19, or in those who have been in contact with the virus.  Negative results must be combined with clinical observations, patient history, and epidemiological information. The expected result is Negative.  Fact Sheet for Patients: https://www.jennings-kim.com/  Fact Sheet for Healthcare Providers: https://alexander-rogers.biz/  This test is not yet approved or cleared by the [place] FDA and  has been authorized for detection and/or diagnosis of SARS-CoV-2 by FDA under an Emergency Use Authorization (EUA).  This EUA will remain in effect (meaning this test can be used) for the duration of  the COV                          ID-19 declaration under Section 564(b)(1) of the Act, 21 U.S.C. section 360bbb-3(b)(1), unless the authorization is terminated or revoked sooner.     Preg Test, Ur 04/22/2021 NEGATIVE  NEGATIVE Final   Comment:        THE SENSITIVITY OF THIS METHODOLOGY IS >24 mIU/mL    Cholesterol 04/22/2021 140  0 - 200 mg/dL Final   Triglycerides 16/12/9602 56  <150 mg/dL Final   HDL 54/11/8117 45  >40 mg/dL Final   Total CHOL/HDL Ratio 04/22/2021 3.1  RATIO Final   VLDL 04/22/2021 11  0 - 40 mg/dL Final   LDL Cholesterol 04/22/2021 84  0 - 99 mg/dL Final   Comment:         Total Cholesterol/HDL:CHD Risk Coronary Heart Disease Risk Table                     Men   Women  1/2 Average Risk   3.4   3.3  Average Risk       5.0   4.4  2 X Average Risk   9.6   7.1  3 X Average Risk  23.4   11.0        Use the calculated Patient Ratio above and the CHD Risk Table to determine the patient's CHD Risk.        ATP III CLASSIFICATION (LDL):  <100     mg/dL   Optimal  147-829  mg/dL   Near or Above                    Optimal  130-159  mg/dL   Borderline  562-130  mg/dL   High  >865     mg/dL   Very High Performed at [facility], [address], [place], [place] [address]   Admission on 04/21/2021, Discharged on 04/22/2021  Component Date Value Ref Range Status   SARS Coronavirus 2 by RT PCR 04/21/2021 NEGATIVE  NEGATIVE Final   Comment: (NOTE) SARS-CoV-2 target nucleic acids are NOT DETECTED.  The SARS-CoV-2 RNA is generally detectable in upper respiratory specimens during the acute phase of infection. The lowest concentration of SARS-CoV-2 viral copies this assay can detect is 138 copies/mL. A negative result does not preclude SARS-Cov-2 infection and should not be used as the sole basis for treatment or other patient management decisions. A negative result may  occur with  improper specimen collection/handling, submission of specimen other than nasopharyngeal swab, presence of viral mutation(s) within the areas targeted by this assay, and inadequate number of viral copies(<138 copies/mL). A negative result must be combined with clinical observations, patient history, and epidemiological information. The expected result is Negative.  Fact Sheet for Patients:  BloggerCourse.com  Fact Sheet for Healthcare Providers:  SeriousBroker.it  This test is no                          t yet approved or cleared by the [place] FDA and  has been authorized for detection and/or diagnosis of SARS-CoV-2  by FDA under an Emergency Use Authorization (EUA). This EUA will remain  in effect (meaning this test can be used) for the duration of the COVID-19 declaration under Section 564(b)(1) of the Act, 21 U.S.C.section 360bbb-3(b)(1), unless the authorization is terminated  or revoked sooner.       Influenza A by PCR 04/21/2021 NEGATIVE  NEGATIVE Final   Influenza B by PCR 04/21/2021 NEGATIVE  NEGATIVE Final   Comment: (NOTE) The Xpert Xpress SARS-CoV-2/FLU/RSV plus assay is intended as an aid in the diagnosis of influenza from Nasopharyngeal swab specimens and should not be used as a sole basis for treatment. Nasal washings and aspirates are unacceptable for Xpert Xpress SARS-CoV-2/FLU/RSV testing.  Fact Sheet for Patients: BloggerCourse.com  Fact Sheet for Healthcare Providers: SeriousBroker.it  This test is not yet approved or cleared by the [place] FDA and has been authorized for detection and/or diagnosis of SARS-CoV-2 by FDA under an Emergency Use Authorization (EUA). This EUA will remain in effect (meaning this test can be used) for the duration of the COVID-19 declaration under Section 564(b)(1) of the Act, 21 U.S.C. section 360bbb-3(b)(1), unless the authorization is terminated or revoked.  Performed at [facility], [address], [place], [place] [address]    Sodium 04/21/2021 138  135 - 145 mmol/L Final   Potassium 04/21/2021 4.2  3.5 - 5.1 mmol/L Final   Chloride 04/21/2021 105  98 - 111 mmol/L Final   CO2 04/21/2021 24  22 - 32 mmol/L Final   Glucose, Bld 04/21/2021 103 (H)  70 - 99 mg/dL Final   Glucose reference range applies only to samples taken after fasting for at least 8 hours.   BUN 04/21/2021 33 (H)  6 - 20 mg/dL Final   Creatinine, Ser 04/21/2021 1.08 (H)  0.44 - 1.00 mg/dL Final   Calcium 60/45/4098 10.4 (H)  8.9 - 10.3 mg/dL Final   Total Protein 11/91/4782 7.7  6.5 - 8.1 g/dL  Final   Albumin 95/62/1308 4.4  3.5 - 5.0 g/dL Final   AST 65/78/4696 76 (H)  15 - 41 U/L Final   ALT 04/21/2021 58 (H)  0 - 44 U/L Final   Alkaline Phosphatase 04/21/2021 81  38 - 126 U/L Final   Total Bilirubin 04/21/2021 1.1  0.3 - 1.2 mg/dL Final   GFR, Estimated 04/21/2021 >60  >60 mL/min Final   Comment: (NOTE) Calculated using the CKD-EPI Creatinine Equation (2021)    Anion gap 04/21/2021 9  5 - 15 Final   Performed at [facility], [address], [place], [place] [address]   Alcohol, Ethyl (B) 04/21/2021 <10  <10 mg/dL Final   Comment: (NOTE) Lowest detectable limit for serum alcohol is 10 mg/dL.  For medical purposes only. Performed at [facility], [address] [place], [place] [address]    Opiates 04/21/2021 NONE DETECTED  NONE DETECTED Final   Cocaine 04/21/2021 NONE DETECTED  NONE DETECTED Final   Benzodiazepines 04/21/2021 POSITIVE (A)  NONE DETECTED Final   Amphetamines 04/21/2021 POSITIVE (A)  NONE DETECTED Final   Tetrahydrocannabinol 04/21/2021 NONE DETECTED  NONE DETECTED Final   Barbiturates 04/21/2021 NONE DETECTED  NONE DETECTED Final   Comment: (NOTE) DRUG SCREEN FOR MEDICAL PURPOSES ONLY.  IF CONFIRMATION IS NEEDED FOR ANY PURPOSE, NOTIFY LAB WITHIN 5 DAYS.  LOWEST DETECTABLE LIMITS FOR URINE DRUG SCREEN Drug Class                     Cutoff (ng/mL) Amphetamine and metabolites    1000 Barbiturate and metabolites    200 Benzodiazepine                 200 Tricyclics and metabolites     300 Opiates and metabolites        300 Cocaine and metabolites        300 THC                            50 Performed at [facility], [address] [place], [place] [address]    WBC 04/21/2021 11.3 (H)  4.0 - 10.5 K/uL Final   RBC 04/21/2021 3.95  3.87 - 5.11 MIL/uL Final   Hemoglobin 04/21/2021 12.4  12.0 - 15.0 g/dL Final   HCT 09/81/1914 36.2  36.0 - 46.0 % Final   MCV 04/21/2021 91.6  80.0 - 100.0  fL Final   MCH 04/21/2021 31.4  26.0 - 34.0 pg Final   MCHC 04/21/2021 34.3  30.0 - 36.0 g/dL Final   RDW 78/29/5621 13.4  11.5 - 15.5 % Final   Platelets 04/21/2021 472 (H)  150 - 400 K/uL Final   nRBC 04/21/2021 0.0  0.0 - 0.2 % Final   Neutrophils Relative % 04/21/2021 52  % Final   Neutro Abs 04/21/2021 5.8  1.7 - 7.7 K/uL Final   Lymphocytes Relative 04/21/2021 39  % Final   Lymphs Abs 04/21/2021 4.4 (H)  0.7 - 4.0 K/uL Final   Monocytes Relative 04/21/2021 8  % Final   Monocytes Absolute 04/21/2021 0.9  0.1 - 1.0 K/uL Final   Eosinophils Relative 04/21/2021 1  % Final   Eosinophils Absolute 04/21/2021 0.1  0.0 - 0.5 K/uL Final   Basophils Relative 04/21/2021 0  % Final   Basophils Absolute 04/21/2021 0.1  0.0 - 0.1 K/uL Final   Immature Granulocytes 04/21/2021 0  % Final   Abs Immature Granulocytes 04/21/2021 0.02  0.00 - 0.07 K/uL Final   Reactive, Benign Lymphocytes 04/21/2021 PRESENT   Final   Performed at [facility], [address], [place], [place] [address]  Admission on 03/25/2021, Discharged on 03/26/2021  Component Date Value Ref Range Status   Sodium 03/25/2021 136  135 - 145 mmol/L Final   Potassium 03/25/2021 3.3 (L)  3.5 - 5.1 mmol/L Final   Chloride 03/25/2021 103  98 - 111 mmol/L Final   CO2 03/25/2021 24  22 - 32 mmol/L Final   Glucose, Bld 03/25/2021 111 (H)  70 - 99 mg/dL Final   Glucose reference range applies only to samples taken after fasting for at least 8 hours.   BUN 03/25/2021 23 (H)  6 - 20 mg/dL Final   Creatinine, Ser 03/25/2021 0.85  0.44 - 1.00 mg/dL Final   Calcium 89/38/1017 9.3  8.9 - 10.3 mg/dL Final   Total Protein 51/04/5850 8.3 (H)  6.5 - 8.1 g/dL Final   Albumin 77/82/4235 4.6  3.5 - 5.0 g/dL Final   AST 36/14/4315 62 (H)  15 - 41 U/L Final   ALT 03/25/2021 72 (H)  0 - 44 U/L Final   Alkaline Phosphatase 03/25/2021 90  38 - 126 U/L Final   Total Bilirubin 03/25/2021 0.4  0.3 - 1.2 mg/dL Final   GFR, Estimated  03/25/2021 >60  >60 mL/min Final   Comment: (NOTE) Calculated using the CKD-EPI Creatinine Equation (2021)    Anion gap 03/25/2021 9  5 - 15 Final   Performed at [facility], [address], [place], [place] [address]   Alcohol, Ethyl (B) 03/25/2021 <10  <10 mg/dL Final   Comment: (NOTE) Lowest detectable limit for serum alcohol is 10 mg/dL.  For medical purposes only. Performed at [facility], [address], [place], [place] [address]    Salicylate Lvl 03/25/2021 <7.0 (L)  7.0 - 30.0 mg/dL Final   Performed at [facility], [address], [place], [place] [address]   Acetaminophen (Tylenol), Serum 03/25/2021 <10 (L)  10 - 30 ug/mL Final   Comment: (NOTE) Therapeutic concentrations vary significantly. A range of 10-30 ug/mL  may be an effective concentration for many patients. However, some  are best treated at concentrations outside of this range. Acetaminophen concentrations >150 ug/mL at 4 hours after ingestion  and >50 ug/mL at 12 hours after ingestion are often associated with  toxic reactions.  Performed at [facility], [address] [place], [place] [address]    WBC 03/25/2021 10.6 (H)  4.0 - 10.5 K/uL Final   RBC 03/25/2021 4.45  3.87 - 5.11 MIL/uL Final   Hemoglobin 03/25/2021 14.6  12.0 - 15.0 g/dL Final   HCT 80/99/8338 43.2  36.0 - 46.0 % Final   MCV 03/25/2021 97.1  80.0 - 100.0 fL Final   MCH 03/25/2021 32.8  26.0 - 34.0 pg Final   MCHC 03/25/2021 33.8  30.0 - 36.0 g/dL Final   RDW 25/07/3974 13.7  11.5 - 15.5 % Final   Platelets 03/25/2021 542 (H)  150 - 400 K/uL Final   nRBC 03/25/2021 0.0  0.0 - 0.2 % Final   Performed at [facility], [address], [place], [place] [address]   Opiates 03/26/2021 NONE DETECTED  NONE DETECTED Final   Cocaine 03/26/2021 POSITIVE (A)  NONE DETECTED Final   Benzodiazepines 03/26/2021 NONE DETECTED  NONE DETECTED Final   Amphetamines 03/26/2021 POSITIVE (A)  NONE DETECTED Final   Tetrahydrocannabinol 03/26/2021 POSITIVE (A)   NONE DETECTED Final   Barbiturates 03/26/2021 NONE DETECTED  NONE DETECTED Final   Comment: (NOTE) DRUG SCREEN FOR MEDICAL PURPOSES ONLY.  IF CONFIRMATION IS NEEDED FOR ANY PURPOSE, NOTIFY LAB WITHIN 5 DAYS.  LOWEST DETECTABLE LIMITS FOR URINE DRUG SCREEN Drug Class                     Cutoff (ng/mL) Amphetamine and metabolites    1000 Barbiturate and metabolites    200 Benzodiazepine                 200 Tricyclics and metabolites     300 Opiates and metabolites        300 Cocaine and metabolites        300 THC  50 Performed at [facility], [address] [place], [place] [address]    Preg Test, Ur 03/26/2021 Negative  Negative Final   SARS Coronavirus 2 by RT PCR 03/26/2021 NEGATIVE  NEGATIVE Final   Comment: (NOTE) SARS-CoV-2 target nucleic acids are NOT DETECTED.  The SARS-CoV-2 RNA is generally detectable in upper respiratory specimens during the acute phase of infection. The lowest concentration of SARS-CoV-2 viral copies this assay can detect is 138 copies/mL. A negative result does not preclude SARS-Cov-2 infection and should not be used as the sole basis for treatment or other patient management decisions. A negative result may occur with  improper specimen collection/handling, submission of specimen other than nasopharyngeal swab, presence of viral mutation(s) within the areas targeted by this assay, and inadequate number of viral copies(<138 copies/mL). A negative result must be combined with clinical observations, patient history, and epidemiological information. The expected result is Negative.  Fact Sheet for Patients:  BloggerCourse.com  Fact Sheet for Healthcare Providers:  SeriousBroker.it  This test is no                          t yet approved or cleared by the [place] FDA and  has been authorized for detection and/or diagnosis of SARS-CoV-2 by FDA under an Emergency Use  Authorization (EUA). This EUA will remain  in effect (meaning this test can be used) for the duration of the COVID-19 declaration under Section 564(b)(1) of the Act, 21 U.S.C.section 360bbb-3(b)(1), unless the authorization is terminated  or revoked sooner.       Influenza A by PCR 03/26/2021 NEGATIVE  NEGATIVE Final   Influenza B by PCR 03/26/2021 NEGATIVE  NEGATIVE Final   Comment: (NOTE) The Xpert Xpress SARS-CoV-2/FLU/RSV plus assay is intended as an aid in the diagnosis of influenza from Nasopharyngeal swab specimens and should not be used as a sole basis for treatment. Nasal washings and aspirates are unacceptable for Xpert Xpress SARS-CoV-2/FLU/RSV testing.  Fact Sheet for Patients: BloggerCourse.com  Fact Sheet for Healthcare Providers: SeriousBroker.it  This test is not yet approved or cleared by the [place] FDA and has been authorized for detection and/or diagnosis of SARS-CoV-2 by FDA under an Emergency Use Authorization (EUA). This EUA will remain in effect (meaning this test can be used) for the duration of the COVID-19 declaration under Section 564(b)(1) of the Act, 21 U.S.C. section 360bbb-3(b)(1), unless the authorization is terminated or revoked.  Performed at [facility], [address] [place], [place] [address]    Preg Test, Ur 03/26/2021 NEGATIVE  NEGATIVE Final   Comment:        THE SENSITIVITY OF THIS METHODOLOGY IS >20 mIU/mL. Performed at [facility], [address], [place], [place] [address]    Color, Urine 03/26/2021 AMBER (A)  YELLOW Final   BIOCHEMICALS MAY BE AFFECTED BY COLOR   APPearance 03/26/2021 TURBID (A)  CLEAR Final   Specific Gravity, Urine 03/26/2021 1.023  1.005 - 1.030 Final   pH 03/26/2021 5.0  5.0 - 8.0 Final   Glucose, UA 03/26/2021 NEGATIVE  NEGATIVE mg/dL Final   Hgb urine dipstick 03/26/2021 NEGATIVE  NEGATIVE Final   Bilirubin Urine 03/26/2021 NEGATIVE  NEGATIVE  Final   Ketones, ur 03/26/2021 20 (A)  NEGATIVE mg/dL Final   Protein, ur 13/10/6576 NEGATIVE  NEGATIVE mg/dL Final   Nitrite 46/96/2952 NEGATIVE  NEGATIVE Final   Leukocytes,Ua 03/26/2021 NEGATIVE  NEGATIVE Final   RBC / HPF 03/26/2021 0-5  0 -  5 RBC/hpf Final   WBC, UA 03/26/2021 21-50  0 - 5 WBC/hpf Final   Bacteria, UA 03/26/2021 RARE (A)  NONE SEEN Final   Squamous Epithelial / LPF 03/26/2021 0-5  0 - 5 Final   WBC Clumps 03/26/2021 PRESENT   Final   Mucus 03/26/2021 PRESENT   Final   Budding Yeast 03/26/2021 PRESENT   Final   Hyaline Casts, UA 03/26/2021 PRESENT   Final   Performed at [facility] [address] [place], [place] [address]    Blood Alcohol level:  Lab Results  Component Value Date   [facility] <10 04/22/2021   ETH <10 04/21/2021    Metabolic Disorder Labs: Lab Results  Component Value Date   HGBA1C 5.1 04/22/2021   MPG 99.67 04/22/2021   MPG 105.41 11/10/2017   No results found for: PROLACTIN Lab Results  Component Value Date   CHOL 140 04/22/2021   TRIG 56 04/22/2021   HDL 45 04/22/2021   CHOLHDL 3.1 04/22/2021   VLDL 11 04/22/2021   LDLCALC 84 04/22/2021   LDLCALC 64 11/10/2017    Therapeutic Lab Levels: No results found for: LITHIUM No results found for: VALPROATE No components found for:  CBMZ  Physical Findings   AIMS    Flowsheet Row [facility]  AIMS Total Score 0      AUDIT    Flowsheet Row [facility]  Alcohol Use Disorder Identification Test Final Score (AUDIT) 5      PHQ2-9    Flowsheet Row Office Visit from 11/19/2015 in [facility] Office Visit from 08/26/2015 in [facility] Office Visit from 05/26/2015 in [facility] Office Visit from 12/10/2014 in [facility] Office Visit from  11/17/2014 in [facility]  PHQ-2 Total Score 0 2 0 0 0  PHQ-9 Total Score -- 13 -- -- --      Flowsheet Row ED from 04/22/2021 in [facility] ED from 04/21/2021 in [facility] from 03/25/2021 in [facility] EM[facility]GENCY [facility]PART[facility]  C-SSRS RISK CATEGORY Low Risk No Risk Moderate Risk        Musculoskeletal  Strength & Muscle Tone: within normal limits Gait & Station: normal Patient leans: N/A  Psychiatric Specialty Exam  Presentation  General Appearance: Disheveled  Eye Contact:Fair  Speech:Clear and Coherent; Normal Rate  Speech Volume:Normal  Handedness:Right   Mood and Affect  Mood:Anxious; Depressed; Hopeless  Affect:Tearful; Congruent; Depressed   Thought Process  Thought Processes:Coherent  Descriptions of Associations:Intact  Orientation:Full (Time, Place and Person)  Thought Content:Logical  Diagnosis of Schizophrenia or Schizoaffective disorder in past: No  Duration of Psychotic Symptoms: Less than six months   Hallucinations:Hallucinations: None Description of Auditory Hallucinations: hears voicesbut cant describe. she cant hear what they are saying  Ideas of Reference:None  Suicidal Thoughts:Suicidal Thoughts: Yes, Passive SI Active Intent and/or Plan: Without Intent; Without Plan; Without Means to Carry Out SI Passive Intent and/or Plan: Without Intent; Without Plan; Without Means to Carry Out  Homicidal Thoughts:Homicidal Thoughts: No   Sensorium  Memory:Immediate Good; Recent Good; Remote Good  Judgment:Fair  Insight:Fair   Executive Functions  Concentration:Good  Attention Span:Good  Recall:Good  Fund of Knowledge:Good  Language:Good   Psychomotor Activity  Psychomotor Activity:Psychomotor Activity: Normal   Assets  Assets:Communication Skills; Desire  for Improvement; Physical Health; Resilience   Sleep  Sleep:Sleep: Fair   No data  recorded  Physical Exam  Physical Exam Vitals and nursing note reviewed.  Constitutional:      General: She is not in acute distress.    Appearance: Normal appearance. She is well-developed.  HENT:     Head: Normocephalic and atraumatic.  Eyes:     General:        Right eye: No discharge.        Left eye: No discharge.     Conjunctiva/sclera: Conjunctivae normal.  Cardiovascular:     Rate and Rhythm: Normal rate.  Pulmonary:     Effort: Pulmonary effort is normal. No respiratory distress.  Musculoskeletal:        General: Normal range of motion.     Cervical back: Normal range of motion.  Skin:    Coloration: Skin is not jaundiced or pale.  Neurological:     Mental Status: She is alert and oriented to person, place, and time.  Psychiatric:        Attention and Perception: Attention and perception normal.        Mood and Affect: Mood is anxious and depressed. Affect is tearful.        Speech: Speech normal.        Behavior: Behavior is cooperative.        Thought Content: Thought content includes suicidal ideation. Thought content does not include suicidal plan.        Cognition and Memory: Cognition normal.        Judgment: Judgment is impulsive.   Review of Systems  Constitutional: Negative.   HENT: Negative.    Eyes: Negative.   Respiratory: Negative.    Cardiovascular: Negative.   Musculoskeletal: Negative.   Skin: Negative.   Neurological: Negative.   Psychiatric/Behavioral:  Positive for depression, substance abuse and suicidal ideas. The patient is nervous/anxious.   Blood pressure 115/90, pulse (!) 102, temperature 98.7 F (37.1 C), temperature source Oral, resp. rate 18, SpO2 98 %. There is no height or weight on file to calculate BMI.  Treatment Plan Summary: Daily contact with patient to assess and evaluate symptoms and progress in treatment and Medication management  Disposition: Patient continues to meet [facility]. Will continue  to monitor patient. Patient has not progressed while on the unit. Patient will be assessed daily, if she continues to make no progress a higher level of care may need to be considered.   Ardis Hughsarolyn H Samnang Shugars, NP 04/25/2021 10:07 AM

## 2021-04-25 NOTE — ED Notes (Signed)
Pt lying on bed crying. Pt states, "I don't know why I cry like this. I guess I'm just depressed. I'm always depressed". Denies SI at present but endorse depression and hopelessness. Pt states, "I have so many things going thru my mind right now, all the time. I don't care what happens to me". Encouragement and support provided. Pt refused to eat breakfast. RN asked pt if she reviewed coping skills reviewed with her yesterday, and pt states, "no. Informed NP of concern that pt wasn't progressing. NP talking with pt at present. Will continue to monitor for safety.

## 2021-04-25 NOTE — ED Notes (Signed)
Pt sleeping@this time. Breathing even and unlabored. Will continue to monitor for safety 

## 2021-04-26 DIAGNOSIS — F1510 Other stimulant abuse, uncomplicated: Secondary | ICD-10-CM | POA: Diagnosis not present

## 2021-04-26 DIAGNOSIS — F4310 Post-traumatic stress disorder, unspecified: Secondary | ICD-10-CM | POA: Diagnosis not present

## 2021-04-26 DIAGNOSIS — F19951 Other psychoactive substance use, unspecified with psychoactive substance-induced psychotic disorder with hallucinations: Secondary | ICD-10-CM | POA: Diagnosis not present

## 2021-04-26 DIAGNOSIS — Z20822 Contact with and (suspected) exposure to covid-19: Secondary | ICD-10-CM | POA: Diagnosis not present

## 2021-04-26 MED ORDER — ESCITALOPRAM OXALATE 20 MG PO TABS
20.0000 mg | ORAL_TABLET | Freq: Every day | ORAL | Status: DC
  Administered 2021-04-27 – 2021-04-28 (×2): 20 mg via ORAL
  Filled 2021-04-26 (×2): qty 7
  Filled 2021-04-26: qty 2

## 2021-04-26 MED ORDER — ESCITALOPRAM OXALATE 10 MG PO TABS
10.0000 mg | ORAL_TABLET | Freq: Once | ORAL | Status: AC
  Administered 2021-04-26: 10 mg via ORAL
  Filled 2021-04-26: qty 1

## 2021-04-26 NOTE — ED Notes (Signed)
Pt sleeping@this time. Breathing even and unlabored. Will continue to monitor for safety 

## 2021-04-26 NOTE — ED Notes (Signed)
Snacks given 

## 2021-04-26 NOTE — Progress Notes (Signed)
Patient was up for dinner and ate well.  She also showered and asked for vistaril which was given to her.  She sat in day room for a while and then went back to her room and is sleeping now.  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  She is anxious about reaching her lawyers and made several calls.  Support provided.

## 2021-04-26 NOTE — ED Notes (Signed)
Patient is calm and cooperative with dysphoric mood and constricted affect.  She denies avh shi or plan.  Is able to make needs known and has been encouraged to seek out staff if overwhelmed.  Patient is presently awake and alert and denies withdrawal symptoms. She has asked for and has been given breakfast.  Appears motivated for treatment.  Will monitor and provide safe environment for her.

## 2021-04-26 NOTE — Group Note (Signed)
Group Topic: Wellness  Group Date: 04/26/2021 Start Time: 1010 End Time: 1055 Facilitators: Levander Campion  Department: [facility]  Number of Participants: 1  Group Focus: acceptance Treatment Modality:  Individual Therapy Interventions utilized were patient education Purpose: enhance coping skills  Name: [name] Date of Birth: 07-Feb-1976  MR: [id number]    Level of Participation: active Quality of Participation: attentive and cooperative Interactions with others: gave feedback Mood/Affect: appropriate Triggers (if applicable): n/a Cognition: coherent/clear and concrete Progress: Moderate Response: n/a Plan: follow-up needed  Patients Problems:  Patient Active Problem List   Diagnosis Date Noted   Homelessness 04/22/2021   Substance induced mood disorder (HCC)    PTSD (post-traumatic stress disorder)    Methamphetamine use disorder, severe (HCC)    Splenic rupture 05/26/2019   Alcohol abuse 11/09/2017   Suicidal thoughts 11/09/2017   Major depressive disorder, recurrent severe without psychotic features (HCC) 11/08/2017   Severe recurrent major depression with psychotic features (HCC) 11/08/2017   Right thigh pain 11/21/2015   Finger injury 11/21/2015   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

## 2021-04-26 NOTE — Progress Notes (Signed)
Patient awoke and made some phone calls in order to try and reach her lawyers related to a court case she has coming up which will make it difficult to go to rehab.  She is hoping to have the date postponed.  Support given and suggestions for maintaining sobriety given to her in case rehab is not possible.  She was open to discussing these options of attending AA/NA meetings.  She was also given a snack and RN and patient listened to some music and talked.  She is feeling better as per her own admission and appears less constricted this afternoon.  Encouraged her to seek out staff and will monitor and provide support as needed.
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
## 2021-04-26 NOTE — ED Provider Notes (Signed)
Behavioral Health Progress Note  Date and Time: 04/26/2021 1:45 PM Name: [name] MRN:  [id number]  Subjective:   [name] is a 46 y.o. female with a history of depression, anxiety, PTSD, substance induced mood disorder and methamphetamine abuse who presents voluntarily to [facility] with law enforcement due to SI and AVH on 1/26. She was restarted on her home medications and admitted to the observation unit. UDS+amphetamines, methamphetamine, benzos.etoh negative.  Patient seen and chart reviewed.  She has been medication compliant and appropriate with staff and peers on the unit.  This morning patient is observed in a group and participating appropriately with staff.  She describes her mood as "a lot better" and apologizes for how she may have behaved prior to today.  She acknowledges her substance use is an issue and states "I want to long-term program.  I not  only want it, I need it."  She reports sleeping "pretty good" and states that her appetite is improving.  She identifies Adderall as her drug of choice.  She describes obtaining Adderall off the street.  She admits to using methamphetamine recently but attributes this to being "around the wrong people".  She denies alcohol use or other substance use.  She denies HI/AVH.  She denies current SI but states that she was experiencing passive SI this morning withoug plan or intent..  She denies all physical symptoms apart from a headache and some blurred vision which is improving.  Patient attributes this to taking a lot of Adderall prior to presentation.  She expresses that she would feel ready to go to substance use treatment as soon as tomorrow.  Discussed with patient that referrals will be made and that she will be provided an update once treatment team hears back from substance use facilities.  Discussed medications-she indicates that she continues to experience elevated anxiety and low mood.  Discussed increasing Lexapro to 20 mg  daily-patient is agreeable.  Discussed with patient that she will be provided an additional dose of 10 mg today and that starting tomorrow she will receive 20 mg daily.  Patient verbalized understanding and was in agreement.  Discussed that Vistaril as needed is available for as needed anxiety.  Patient verbalizes understanding.  Patient was given the opportunity to ask questions and  All questions answered. Patient verbalized understanding regarding plan of care.       Diagnosis:  Final diagnoses:  PTSD (post-traumatic stress disorder)  Methamphetamine abuse (HCC)  Substance induced mood disorder (HCC)  Substance-induced psychotic disorder with hallucinations (HCC)    Total Time spent with patient: 20 minutes  Past Psychiatric History: PTSD, MDD, substance use, SIMD Past Medical History:  Past Medical History:  Diagnosis Date   Anxiety    Arthritis    Asthma    last flareup was yr or so with bronchitis   Bipolar disorder (HCC)    Carpal tunnel syndrome    COPD (chronic obstructive pulmonary disease) (HCC)    Depressed    Dysrhythmia    'skipped beats every now and then"   Emphysema    does smoke, and has slowed down   Fibromyalgia    Fibromyalgia, primary    GERD (gastroesophageal reflux disease)    periodic indigestion   Hepatitis C    Hypertension    dx a couple of months ago   Kidney stone    Manic depressive disorder (HCC)    Nephrolithiasis    PTSD (post-traumatic stress disorder)    UTI (lower  urinary tract infection)     Past Surgical History:  Procedure Laterality Date   ABDOMINAL HYSTERECTOMY     ANTERIOR CERVICAL DECOMP/DISCECTOMY FUSION N/A 04/22/2015   Procedure: Cervical 4-5, Cervical 5-6 Anterior Cervical Discectomy and Fusion, Allograft, Plate;  Surgeon: Eldred Manges, MD;  Location: [facility] OR;  Service: Orthopedics;  Laterality: N/A;   CARPAL TUNNEL RELEASE     right hand   ORIF ANKLE FRACTURE  02/03/2012   Procedure: OPEN REDUCTION INTERNAL FIXATION  (ORIF) ANKLE FRACTURE;  Surgeon: Nadara Mustard, MD;  Location: [facility] OR;  Service: Orthopedics;  Laterality: Right;  Open Reduction Internal Fixation Right Ankle   ORIF CLAVICULAR FRACTURE Left 08/29/2012   Dr Ophelia Charter   ORIF CLAVICULAR FRACTURE Left 08/29/2012   Procedure: OPEN REDUCTION INTERNAL FIXATION (ORIF) CLAVICULAR FRACTURE;  Surgeon: Eldred Manges, MD;  Location: [facility] OR;  Service: Orthopedics;  Laterality: Left;  Open Reduction Internal Fixation Left Clavicle Fracture   SPLENECTOMY, 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 Father history of drug abuse, brother and sister withbipolar disorder.  Social History:  Social History   Substance and Sexual Activity  Alcohol Use Yes   Comment: occ mixed drink or liquor     Social History   Substance and Sexual Activity  Drug Use Not Currently   Types: Marijuana   Comment: last marijuana use 05/27/2019    Social History   Socioeconomic History   Marital status: Single    Spouse name: Not on file   Number of children: 4   Years of education: 8th   Highest education level: Not on file  Occupational History    Employer: [employer]    Comment: [employer]  Tobacco Use   Smoking status: Every Day    Packs/day: 1.00    Years: 28.00    Pack years: 28.00    Types: Cigarettes    Start date: 03/28/1990   Smokeless tobacco: Never  Vaping Use   Vaping Use: Never used  Substance and Sexual Activity   Alcohol use: Yes     Comment: occ mixed drink or liquor   Drug use: Not Currently    Types: Marijuana    Comment: last marijuana use 05/27/2019   Sexual activity: Not on file  Other Topics Concern   Not on file  Social History Narrative   ** Merged History Encounter **       As of 02/20/12:  Completed some high school. Has a fiance Conley Rolls) who brings her to appointments. Unemployed. No regular exercise. Feels safe in her relationships  Has smoked cigarettes since she was 46 years old, currently doing 1/2 to 1 pack    every two days. Denies recreational drug use currently or a history of ever using IV drugs. Occasionally drinks a couple drinks about twice per week. There was a time where she  drank 2-3 drinks either every day or every other day for about three years du   ring 2009-2012.  Note: after reviewing prior records, patient has had multiple admissions to behavioral health, several of which mention alcohol abuse, and at least one of which was for alcohol detoxification. Patient did not share this information duri   ng our visit.  Has four children (ages 102, 21, 50, 39). The 104 year old lives with pt's sister in [place] so that they can continue to go to school at the same place. The 44 year old recently moved in with her two months ago, but had previously    been living with pt's sister as well (moved in as there was conflict between child & pt's sister). The other two kids are adults and live on their own.  Caffeine Use: 1-2 cups daily   Social Determinants of Health   Financial Resource Strain: Not on file  Food Insecurity: Not on file  Transportation Needs: Not on file  Physical Activity: Not on file  Stress: Not on file  Social Connections: Not on file   SDOH:  SDOH Screenings   Alcohol Screen: Not on file  Depression (PHQ2-9): Not on file  Financial Resource Strain: Not on file  Food Insecurity: Not on file  Housing: Not on file  Physical Activity: Not on file   Social Connections: Not on file  Stress: Not on file  Tobacco Use: High Risk   Smoking Tobacco Use: Every Day   Smokeless Tobacco Use: Never   Passive Exposure: Not on file  Transportation Needs: Not on file   Additional Social History:                         Sleep: Good  Appetite:   improving  Current Medications:  Current Facility-Administered Medications  Medication Dose Route Frequency Provider Last Rate Last Admin   acetaminophen (TYLENOL) tablet 650 mg  650 mg Oral Q6H PRN Nira Conn A, NP   650 mg at 04/26/21 1144   alum & mag hydroxide-simeth (MAALOX/MYLANTA) 200-200-20 MG/5ML suspension 30 mL  30 mL Oral Q4H PRN Jackelyn Poling, NP       [START ON 04/27/2021] escitalopram (LEXAPRO) tablet 20 mg  20 mg Oral Daily Estella Husk, MD       gabapentin (NEURONTIN) capsule 300 mg  300 mg Oral TID Nira Conn A, NP   300 mg at 04/26/21 0936   hydrOXYzine (ATARAX) tablet 25 mg  25 mg Oral TID PRN Jackelyn Poling, NP   25 mg at 04/25/21 1353   magnesium hydroxide (MILK OF MAGNESIA) suspension 30 mL  30 mL Oral Daily PRN Jackelyn Poling, NP       OLANZapine (ZYPREXA) tablet 5 mg  5 mg Oral QHS Nira Conn A, NP   5 mg at 04/25/21 2132   traZODone (DESYREL) tablet 50 mg  50 mg Oral QHS PRN Jackelyn Poling, NP       Current Outpatient Medications  Medication Sig Dispense Refill   benztropine (COGENTIN) 1 MG tablet Take 1 mg by mouth 2 (two) times daily.     escitalopram (LEXAPRO) 10 MG tablet Take 10 mg by mouth at bedtime.     gabapentin (NEURONTIN) 300 MG capsule Take 1 capsule (300 mg total) by mouth 3 (three) times daily as needed. 60 capsule 0   hydrOXYzine (VISTARIL) 50 MG capsule Take 50 mg by mouth 2 (two) times  daily.     lamoTRIgine (LAMICTAL) 25 MG tablet Take 25 mg by mouth daily.      Labs  Lab Results:  Admission on 04/22/2021  Component Date Value Ref Range Status   SARS Coronavirus 2 by RT PCR 04/22/2021 NEGATIVE  NEGATIVE Final   Comment:  (NOTE) SARS-CoV-2 target nucleic acids are NOT DETECTED.  The SARS-CoV-2 RNA is generally detectable in upper respiratory specimens during the acute phase of infection. The lowest concentration of SARS-CoV-2 viral copies this assay can detect is 138 copies/mL. A negative result does not preclude SARS-Cov-2 infection and should not be used as the sole basis for treatment or other patient management decisions. A negative result may occur with  improper specimen collection/handling, submission of specimen other than nasopharyngeal swab, presence of viral mutation(s) within the areas targeted by this assay, and inadequate number of viral copies(<138 copies/mL). A negative result must be combined with clinical observations, patient history, and epidemiological information. The expected result is Negative.  Fact Sheet for Patients:  BloggerCourse.com  Fact Sheet for Healthcare Providers:  SeriousBroker.it  This test is no                          t yet approved or cleared by the [place] FDA and  has been authorized for detection and/or diagnosis of SARS-CoV-2 by FDA under an Emergency Use Authorization (EUA). This EUA will remain  in effect (meaning this test can be used) for the duration of the COVID-19 declaration under Section 564(b)(1) of the Act, 21 U.S.C.section 360bbb-3(b)(1), unless the authorization is terminated  or revoked sooner.       Influenza A by PCR 04/22/2021 NEGATIVE  NEGATIVE Final   Influenza B by PCR 04/22/2021 NEGATIVE  NEGATIVE Final   Comment: (NOTE) The Xpert Xpress SARS-CoV-2/FLU/RSV plus assay is intended as an aid in the diagnosis of influenza from Nasopharyngeal swab specimens and should not be used as a sole basis for treatment. Nasal washings and aspirates are unacceptable for Xpert Xpress SARS-CoV-2/FLU/RSV testing.  Fact Sheet for Patients: BloggerCourse.com  Fact  Sheet for Healthcare Providers: SeriousBroker.it  This test is not yet approved or cleared by the [place] FDA and has been authorized for detection and/or diagnosis of SARS-CoV-2 by FDA under an Emergency Use Authorization (EUA). This EUA will remain in effect (meaning this test can be used) for the duration of the COVID-19 declaration under Section 564(b)(1) of the Act, 21 U.S.C. section 360bbb-3(b)(1), unless the authorization is terminated or revoked.  Performed at [facility], [address] [place], [place] [address]    SARS Coronavirus 2 Ag 04/22/2021 Negative  Negative Preliminary   WBC 04/22/2021 10.9 (H)  4.0 - 10.5 K/uL Final   RBC 04/22/2021 3.84 (L)  3.87 - 5.11 MIL/uL Final   Hemoglobin 04/22/2021 11.9 (L)  12.0 - 15.0 g/dL Final   HCT 60/45/4098 35.7 (L)  36.0 - 46.0 % Final   MCV 04/22/2021 93.0  80.0 - 100.0 fL Final   MCH 04/22/2021 31.0  26.0 - 34.0 pg Final   MCHC 04/22/2021 33.3  30.0 - 36.0 g/dL Final   RDW 11/91/4782 13.2  11.5 - 15.5 % Final   Platelets 04/22/2021 471 (H)  150 - 400 K/uL Final   nRBC 04/22/2021 0.0  0.0 - 0.2 % Final   Neutrophils Relative % 04/22/2021 43  % Final   Neutro Abs 04/22/2021 4.7  1.7 - 7.7 K/uL Final  Lymphocytes Relative 04/22/2021 46  % Final   Lymphs Abs 04/22/2021 5.0 (H)  0.7 - 4.0 K/uL Final   Monocytes Relative 04/22/2021 7  % Final   Monocytes Absolute 04/22/2021 0.8  0.1 - 1.0 K/uL Final   Eosinophils Relative 04/22/2021 4  % Final   Eosinophils Absolute 04/22/2021 0.4  0.0 - 0.5 K/uL Final   Basophils Relative 04/22/2021 0  % Final   Basophils Absolute 04/22/2021 0.0  0.0 - 0.1 K/uL Final   nRBC 04/22/2021 0  0 /100 WBC Final   Abs Immature Granulocytes 04/22/2021 0.00  0.00 - 0.07 K/uL Final   Performed at [facility], [address], [place], [place] [address]   Sodium 04/22/2021 138  135 - 145 mmol/L Final   Potassium 04/22/2021 4.3  3.5 - 5.1 mmol/L Final    Chloride 04/22/2021 104  98 - 111 mmol/L Final   CO2 04/22/2021 26  22 - 32 mmol/L Final   Glucose, Bld 04/22/2021 117 (H)  70 - 99 mg/dL Final   Glucose reference range applies only to samples taken after fasting for at least 8 hours.   BUN 04/22/2021 25 (H)  6 - 20 mg/dL Final   Creatinine, Ser 04/22/2021 0.63  0.44 - 1.00 mg/dL Final   Calcium 40/98/1191 9.1  8.9 - 10.3 mg/dL Final   Total Protein 47/82/9562 6.6  6.5 - 8.1 g/dL Final   Albumin 13/10/6576 3.6  3.5 - 5.0 g/dL Final   AST 46/96/2952 51 (H)  15 - 41 U/L Final   ALT 04/22/2021 46 (H)  0 - 44 U/L Final   Alkaline Phosphatase 04/22/2021 67  38 - 126 U/L Final   Total Bilirubin 04/22/2021 0.8  0.3 - 1.2 mg/dL Final   GFR, Estimated 04/22/2021 >60  >60 mL/min Final   Comment: (NOTE) Calculated using the CKD-EPI Creatinine Equation (2021)    Anion gap 04/22/2021 8  5 - 15 Final   Performed at [facility], [address] [place], [place] [address]   Hgb A1c MFr Bld 04/22/2021 5.1  4.8 - 5.6 % Final   Comment: (NOTE) Pre diabetes:          5.7%-6.4%  Diabetes:              >6.4%  Glycemic control for   <7.0% adults with diabetes    Mean Plasma Glucose 04/22/2021 99.67  mg/dL Final   Performed at [facility], [address], [place], [place] [address]   Alcohol, Ethyl (B) 04/22/2021 <10  <10 mg/dL Final   Comment: (NOTE) Lowest detectable limit for serum alcohol is 10 mg/dL.  For medical purposes only. Performed at [facility], [address], [place], [place] [address]    TSH 04/22/2021 0.401  0.350 - 4.500 uIU/mL Final   Comment: Performed by a 3rd Generation assay with a functional sensitivity of <=0.01 uIU/mL. Performed at [facility], [address] [place], [place] [address]    Preg Test, Ur 04/22/2021 NEGATIVE  NEGATIVE Final   Performed at [facility], [address], [place], [place] [address]   POC Amphetamine UR 04/22/2021 Positive (A)  NONE DETECTED (Cut Off Level  1000 ng/mL) Final   POC Secobarbital (BAR) 04/22/2021 None Detected  NONE DETECTED (Cut Off Level 300 ng/mL) Final   POC Buprenorphine (BUP) 04/22/2021 None Detected  NONE DETECTED (Cut Off Level 10 ng/mL) Final   POC Oxazepam (BZO) 04/22/2021 Positive (  A)  NONE DETECTED (Cut Off Level 300 ng/mL) Final   POC Cocaine UR 04/22/2021 None Detected  NONE DETECTED (Cut Off Level 300 ng/mL) Final   POC Methamphetamine UR 04/22/2021 Positive (A)  NONE DETECTED (Cut Off Level 1000 ng/mL) Final   POC Morphine 04/22/2021 None Detected  NONE DETECTED (Cut Off Level 300 ng/mL) Final   POC Oxycodone UR 04/22/2021 None Detected  NONE DETECTED (Cut Off Level 100 ng/mL) Final   POC Methadone UR 04/22/2021 None Detected  NONE DETECTED (Cut Off Level 300 ng/mL) Final   POC Marijuana UR 04/22/2021 None Detected  NONE DETECTED (Cut Off Level 50 ng/mL) Final   SARSCOV2ONAVIRUS 2 AG 04/22/2021 NEGATIVE  NEGATIVE Final   Comment: (NOTE) SARS-CoV-2 antigen NOT DETECTED.   Negative results are presumptive.  Negative results do not preclude SARS-CoV-2 infection and should not be used as the sole basis for treatment or other patient management decisions, including infection  control decisions, particularly in the presence of clinical signs and  symptoms consistent with COVID-19, or in those who have been in contact with the virus.  Negative results must be combined with clinical observations, patient history, and epidemiological information. The expected result is Negative.  Fact Sheet for Patients: https://www.jennings-kim.com/  Fact Sheet for Healthcare Providers: https://alexander-rogers.biz/  This test is not yet approved or cleared by the [place] FDA and  has been authorized for detection and/or diagnosis of SARS-CoV-2 by FDA under an Emergency Use Authorization (EUA).  This EUA will remain in effect (meaning this test can be used) for the duration of  the COV                           ID-19 declaration under Section 564(b)(1) of the Act, 21 U.S.C. section 360bbb-3(b)(1), unless the authorization is terminated or revoked sooner.     Preg Test, Ur 04/22/2021 NEGATIVE  NEGATIVE Final   Comment:        THE SENSITIVITY OF THIS METHODOLOGY IS >24 mIU/mL    Cholesterol 04/22/2021 140  0 - 200 mg/dL Final   Triglycerides 16/12/9602 56  <150 mg/dL Final   HDL 54/11/8117 45  >40 mg/dL Final   Total CHOL/HDL Ratio 04/22/2021 3.1  RATIO Final   VLDL 04/22/2021 11  0 - 40 mg/dL Final   LDL Cholesterol 04/22/2021 84  0 - 99 mg/dL Final   Comment:        Total Cholesterol/HDL:CHD Risk Coronary Heart Disease Risk Table                     Men   Women  1/2 Average Risk   3.4   3.3  Average Risk       5.0   4.4  2 X Average Risk   9.6   7.1  3 X Average Risk  23.4   11.0        Use the calculated Patient Ratio above and the CHD Risk Table to determine the patient's CHD Risk.        ATP III CLASSIFICATION (LDL):  <100     mg/dL   Optimal  147-829  mg/dL   Near or Above                    Optimal  130-159  mg/dL   Borderline  562-130  mg/dL   High  >865     mg/dL   Very High Performed at [facility]  [facility], [address] [place], [place] [address]   Admission on 04/21/2021, Discharged on 04/22/2021  Component Date Value Ref Range Status   SARS Coronavirus 2 by RT PCR 04/21/2021 NEGATIVE  NEGATIVE Final   Comment: (NOTE) SARS-CoV-2 target nucleic acids are NOT DETECTED.  The SARS-CoV-2 RNA is generally detectable in upper respiratory specimens during the acute phase of infection. The lowest concentration of SARS-CoV-2 viral copies this assay can detect is 138 copies/mL. A negative result does not preclude SARS-Cov-2 infection and should not be used as the sole basis for treatment or other patient management decisions. A negative result may occur with  improper specimen collection/handling, submission of specimen other than nasopharyngeal swab, presence of  viral mutation(s) within the areas targeted by this assay, and inadequate number of viral copies(<138 copies/mL). A negative result must be combined with clinical observations, patient history, and epidemiological information. The expected result is Negative.  Fact Sheet for Patients:  BloggerCourse.com  Fact Sheet for Healthcare Providers:  SeriousBroker.it  This test is no                          t yet approved or cleared by the [place] FDA and  has been authorized for detection and/or diagnosis of SARS-CoV-2 by FDA under an Emergency Use Authorization (EUA). This EUA will remain  in effect (meaning this test can be used) for the duration of the COVID-19 declaration under Section 564(b)(1) of the Act, 21 U.S.C.section 360bbb-3(b)(1), unless the authorization is terminated  or revoked sooner.       Influenza A by PCR 04/21/2021 NEGATIVE  NEGATIVE Final   Influenza B by PCR 04/21/2021 NEGATIVE  NEGATIVE Final   Comment: (NOTE) The Xpert Xpress SARS-CoV-2/FLU/RSV plus assay is intended as an aid in the diagnosis of influenza from Nasopharyngeal swab specimens and should not be used as a sole basis for treatment. Nasal washings and aspirates are unacceptable for Xpert Xpress SARS-CoV-2/FLU/RSV testing.  Fact Sheet for Patients: BloggerCourse.com  Fact Sheet for Healthcare Providers: SeriousBroker.it  This test is not yet approved or cleared by the [place] FDA and has been authorized for detection and/or diagnosis of SARS-CoV-2 by FDA under an Emergency Use Authorization (EUA). This EUA will remain in effect (meaning this test can be used) for the duration of the COVID-19 declaration under Section 564(b)(1) of the Act, 21 U.S.C. section 360bbb-3(b)(1), unless the authorization is terminated or revoked.  Performed at [facility], [address] [place], [place] [address]    Sodium 04/21/2021 138  135 - 145 mmol/L Final   Potassium 04/21/2021 4.2  3.5 - 5.1 mmol/L Final   Chloride 04/21/2021 105  98 - 111 mmol/L Final   CO2 04/21/2021 24  22 - 32 mmol/L Final   Glucose, Bld 04/21/2021 103 (H)  70 - 99 mg/dL Final   Glucose reference range applies only to samples taken after fasting for at least 8 hours.   BUN 04/21/2021 33 (H)  6 - 20 mg/dL Final   Creatinine, Ser 04/21/2021 1.08 (H)  0.44 - 1.00 mg/dL Final   Calcium 09/81/1914 10.4 (H)  8.9 - 10.3 mg/dL Final   Total Protein 78/29/5621 7.7  6.5 - 8.1 g/dL Final   Albumin 30/86/5784 4.4  3.5 - 5.0 g/dL Final   AST 69/62/9528 76 (H)  15 - 41 U/L Final   ALT 04/21/2021 58 (H)  0 - 44 U/L Final  Alkaline Phosphatase 04/21/2021 81  38 - 126 U/L Final   Total Bilirubin 04/21/2021 1.1  0.3 - 1.2 mg/dL Final   GFR, Estimated 04/21/2021 >60  >60 mL/min Final   Comment: (NOTE) Calculated using the CKD-EPI Creatinine Equation (2021)    Anion gap 04/21/2021 9  5 - 15 Final   Performed at [facility], [address], [place], [place] [address]   Alcohol, Ethyl (B) 04/21/2021 <10  <10 mg/dL Final   Comment: (NOTE) Lowest detectable limit for serum alcohol is 10 mg/dL.  For medical purposes only. Performed at [facility], [address], [place], [place] [address]    Opiates 04/21/2021 NONE DETECTED  NONE DETECTED Final   Cocaine 04/21/2021 NONE DETECTED  NONE DETECTED Final   Benzodiazepines 04/21/2021 POSITIVE (A)  NONE DETECTED Final   Amphetamines 04/21/2021 POSITIVE (A)  NONE DETECTED Final   Tetrahydrocannabinol 04/21/2021 NONE DETECTED  NONE DETECTED Final   Barbiturates 04/21/2021 NONE DETECTED  NONE DETECTED Final   Comment: (NOTE) DRUG SCREEN FOR MEDICAL PURPOSES ONLY.  IF CONFIRMATION IS NEEDED FOR ANY PURPOSE, NOTIFY LAB WITHIN 5 DAYS.  LOWEST DETECTABLE LIMITS FOR URINE DRUG SCREEN Drug Class                     Cutoff  (ng/mL) Amphetamine and metabolites    1000 Barbiturate and metabolites    200 Benzodiazepine                 200 Tricyclics and metabolites     300 Opiates and metabolites        300 Cocaine and metabolites        300 THC                            50 Performed at [facility], [address] [place], [place] [address]    WBC 04/21/2021 11.3 (H)  4.0 - 10.5 K/uL Final   RBC 04/21/2021 3.95  3.87 - 5.11 MIL/uL Final   Hemoglobin 04/21/2021 12.4  12.0 - 15.0 g/dL Final   HCT 91/47/8295 36.2  36.0 - 46.0 % Final   MCV 04/21/2021 91.6  80.0 - 100.0 fL Final   MCH 04/21/2021 31.4  26.0 - 34.0 pg Final   MCHC 04/21/2021 34.3  30.0 - 36.0 g/dL Final   RDW 62/13/0865 13.4  11.5 - 15.5 % Final   Platelets 04/21/2021 472 (H)  150 - 400 K/uL Final   nRBC 04/21/2021 0.0  0.0 - 0.2 % Final   Neutrophils Relative % 04/21/2021 52  % Final   Neutro Abs 04/21/2021 5.8  1.7 - 7.7 K/uL Final   Lymphocytes Relative 04/21/2021 39  % Final   Lymphs Abs 04/21/2021 4.4 (H)  0.7 - 4.0 K/uL Final   Monocytes Relative 04/21/2021 8  % Final   Monocytes Absolute 04/21/2021 0.9  0.1 - 1.0 K/uL Final   Eosinophils Relative 04/21/2021 1  % Final   Eosinophils Absolute 04/21/2021 0.1  0.0 - 0.5 K/uL Final   Basophils Relative 04/21/2021 0  % Final   Basophils Absolute 04/21/2021 0.1  0.0 - 0.1 K/uL Final   Immature Granulocytes 04/21/2021 0  % Final   Abs Immature Granulocytes 04/21/2021 0.02  0.00 - 0.07 K/uL Final   Reactive, Benign Lymphocytes 04/21/2021 PRESENT   Final   Performed at [facility], [address] [place], [place] [address]  Admission on 03/25/2021, Discharged on 03/26/2021  Component Date Value Ref Range Status   Sodium 03/25/2021 136  135 - 145 mmol/L Final   Potassium 03/25/2021 3.3 (L)  3.5 - 5.1 mmol/L Final   Chloride 03/25/2021 103  98 - 111 mmol/L Final   CO2 03/25/2021 24  22 - 32 mmol/L Final   Glucose, Bld 03/25/2021 111 (H)  70 - 99  mg/dL Final   Glucose reference range applies only to samples taken after fasting for at least 8 hours.   BUN 03/25/2021 23 (H)  6 - 20 mg/dL Final   Creatinine, Ser 03/25/2021 0.85  0.44 - 1.00 mg/dL Final   Calcium 34/74/259512/29/2022 9.3  8.9 - 10.3 mg/dL Final   Total Protein 63/87/564312/29/2022 8.3 (H)  6.5 - 8.1 g/dL Final   Albumin 32/95/188412/29/2022 4.6  3.5 - 5.0 g/dL Final   AST 16/60/630112/29/2022 62 (H)  15 - 41 U/L Final   ALT 03/25/2021 72 (H)  0 - 44 U/L Final   Alkaline Phosphatase 03/25/2021 90  38 - 126 U/L Final   Total Bilirubin 03/25/2021 0.4  0.3 - 1.2 mg/dL Final   GFR, Estimated 03/25/2021 >60  >60 mL/min Final   Comment: (NOTE) Calculated using the CKD-EPI Creatinine Equation (2021)    Anion gap 03/25/2021 9  5 - 15 Final   Performed at [facility], [address] [place], [place] [address]   Alcohol, Ethyl (B) 03/25/2021 <10  <10 mg/dL Final   Comment: (NOTE) Lowest detectable limit for serum alcohol is 10 mg/dL.  For medical purposes only. Performed at [facility], [address] [place], [place] [address]    Salicylate Lvl 03/25/2021 <7.0 (L)  7.0 - 30.0 mg/dL Final   Performed at [facility], [address] [place], [place] [address]   Acetaminophen (Tylenol), Serum 03/25/2021 <10 (L)  10 - 30 ug/mL Final   Comment: (NOTE) Therapeutic concentrations vary significantly. A range of 10-30 ug/mL  may be an effective concentration for many patients. However, some  are best treated at concentrations outside of this range. Acetaminophen concentrations >150 ug/mL at 4 hours after ingestion  and >50 ug/mL at 12 hours after ingestion are often associated with  toxic reactions.  Performed at [facility], [address] [place], [place] [address]    WBC 03/25/2021 10.6 (H)  4.0 - 10.5 K/uL Final   RBC 03/25/2021 4.45  3.87 - 5.11 MIL/uL Final   Hemoglobin 03/25/2021 14.6  12.0 - 15.0 g/dL Final   HCT 06/23/762812/29/2022 43.2  36.0 - 46.0 % Final   MCV 03/25/2021 97.1  80.0 - 100.0 fL Final   MCH  03/25/2021 32.8  26.0 - 34.0 pg Final   MCHC 03/25/2021 33.8  30.0 - 36.0 g/dL Final   RDW 31/51/761612/29/2022 13.7  11.5 - 15.5 % Final   Platelets 03/25/2021 542 (H)  150 - 400 K/uL Final   nRBC 03/25/2021 0.0  0.0 - 0.2 % Final   Performed at [facility], [address] [place], [place] [address]   Opiates 03/26/2021 NONE DETECTED  NONE DETECTED Final   Cocaine 03/26/2021 POSITIVE (A)  NONE DETECTED Final   Benzodiazepines 03/26/2021 NONE DETECTED  NONE DETECTED Final   Amphetamines 03/26/2021 POSITIVE (A)  NONE DETECTED Final   Tetrahydrocannabinol 03/26/2021 POSITIVE (A)  NONE DETECTED Final   Barbiturates 03/26/2021 NONE DETECTED  NONE DETECTED Final   Comment: (NOTE) DRUG SCREEN FOR MEDICAL PURPOSES ONLY.  IF CONFIRMATION IS NEEDED FOR ANY PURPOSE, NOTIFY  LAB WITHIN 5 DAYS.  LOWEST DETECTABLE LIMITS FOR URINE DRUG SCREEN Drug Class                     Cutoff (ng/mL) Amphetamine and metabolites    1000 Barbiturate and metabolites    200 Benzodiazepine                 200 Tricyclics and metabolites     300 Opiates and metabolites        300 Cocaine and metabolites        300 THC                            50 Performed at [facility], [address] [place], [place] [address]    Preg Test, Ur 03/26/2021 Negative  Negative Final   SARS Coronavirus 2 by RT PCR 03/26/2021 NEGATIVE  NEGATIVE Final   Comment: (NOTE) SARS-CoV-2 target nucleic acids are NOT DETECTED.  The SARS-CoV-2 RNA is generally detectable in upper respiratory specimens during the acute phase of infection. The lowest concentration of SARS-CoV-2 viral copies this assay can detect is 138 copies/mL. A negative result does not preclude SARS-Cov-2 infection and should not be used as the sole basis for treatment or other patient management decisions. A negative result may occur with  improper specimen collection/handling, submission of specimen other than nasopharyngeal swab, presence of viral mutation(s) within  the areas targeted by this assay, and inadequate number of viral copies(<138 copies/mL). A negative result must be combined with clinical observations, patient history, and epidemiological information. The expected result is Negative.  Fact Sheet for Patients:  BloggerCourse.comhttps://www.fda.gov/media/152166/download  Fact Sheet for Healthcare Providers:  SeriousBroker.ithttps://www.fda.gov/media/152162/download  This test is no                          t yet approved or cleared by the [place] FDA and  has been authorized for detection and/or diagnosis of SARS-CoV-2 by FDA under an Emergency Use Authorization (EUA). This EUA will remain  in effect (meaning this test can be used) for the duration of the COVID-19 declaration under Section 564(b)(1) of the Act, 21 U.S.C.section 360bbb-3(b)(1), unless the authorization is terminated  or revoked sooner.       Influenza A by PCR 03/26/2021 NEGATIVE  NEGATIVE Final   Influenza B by PCR 03/26/2021 NEGATIVE  NEGATIVE Final   Comment: (NOTE) The Xpert Xpress SARS-CoV-2/FLU/RSV plus assay is intended as an aid in the diagnosis of influenza from Nasopharyngeal swab specimens and should not be used as a sole basis for treatment. Nasal washings and aspirates are unacceptable for Xpert Xpress SARS-CoV-2/FLU/RSV testing.  Fact Sheet for Patients: BloggerCourse.comhttps://www.fda.gov/media/152166/download  Fact Sheet for Healthcare Providers: SeriousBroker.ithttps://www.fda.gov/media/152162/download  This test is not yet approved or cleared by the [place] FDA and has been authorized for detection and/or diagnosis of SARS-CoV-2 by FDA under an Emergency Use Authorization (EUA). This EUA will remain in effect (meaning this test can be used) for the duration of the COVID-19 declaration under Section 564(b)(1) of the Act, 21 U.S.C. section 360bbb-3(b)(1), unless the authorization is terminated or revoked.  Performed at [facility], [address] [place], [place] [address]    Preg Test, Ur  03/26/2021 NEGATIVE  NEGATIVE Final   Comment:        THE SENSITIVITY OF THIS METHODOLOGY IS >20 mIU/mL. Performed at [facility], [address] [place], [place]  [address]    Color, Urine 03/26/2021 AMBER (A)  YELLOW Final   BIOCHEMICALS MAY BE AFFECTED BY COLOR   APPearance 03/26/2021 TURBID (A)  CLEAR Final   Specific Gravity, Urine 03/26/2021 1.023  1.005 - 1.030 Final   pH 03/26/2021 5.0  5.0 - 8.0 Final   Glucose, UA 03/26/2021 NEGATIVE  NEGATIVE mg/dL Final   Hgb urine dipstick 03/26/2021 NEGATIVE  NEGATIVE Final   Bilirubin Urine 03/26/2021 NEGATIVE  NEGATIVE Final   Ketones, ur 03/26/2021 20 (A)  NEGATIVE mg/dL Final   Protein, ur 12/75/1700 NEGATIVE  NEGATIVE mg/dL Final   Nitrite 17/49/4496 NEGATIVE  NEGATIVE Final   Leukocytes,Ua 03/26/2021 NEGATIVE  NEGATIVE Final   RBC / HPF 03/26/2021 0-5  0 - 5 RBC/hpf Final   WBC, UA 03/26/2021 21-50  0 - 5 WBC/hpf Final   Bacteria, UA 03/26/2021 RARE (A)  NONE SEEN Final   Squamous Epithelial / LPF 03/26/2021 0-5  0 - 5 Final   WBC Clumps 03/26/2021 PRESENT   Final   Mucus 03/26/2021 PRESENT   Final   Budding Yeast 03/26/2021 PRESENT   Final   Hyaline Casts, UA 03/26/2021 PRESENT   Final   Performed at [facility], [address], [place], [place] [address]    Blood Alcohol level:  Lab Results  Component Value Date   [facility] <10 04/22/2021   ETH <10 04/21/2021    Metabolic Disorder Labs: Lab Results  Component Value Date   HGBA1C 5.1 04/22/2021   MPG 99.67 04/22/2021   MPG 105.41 11/10/2017   No results found for: PROLACTIN Lab Results  Component Value Date   CHOL 140 04/22/2021   TRIG 56 04/22/2021   HDL 45 04/22/2021   CHOLHDL 3.1 04/22/2021   VLDL 11 04/22/2021   LDLCALC 84 04/22/2021   LDLCALC 64 11/10/2017    Therapeutic Lab Levels: No results found for: LITHIUM No results found for: VALPROATE No components found for:  CBMZ  Physical Findings   AIMS    Flowsheet Row [facility]  AIMS Total Score 0      AUDIT    Flowsheet Row [facility]  Alcohol Use Disorder Identification Test Final Score (AUDIT) 5      PHQ2-9    Flowsheet Row Office Visit from 11/19/2015 in [facility] Office Visit from 08/26/2015 in [facility] Office Visit from 05/26/2015 in [facility] Office Visit from 12[facility]10/2014 in [facility] Office Visit from 11/17/2014 in [facility]  PHQ-2 Total Score 0 2 0 0 0  PHQ-9 Total Score -- 13 -- -- --      Flowsheet Row ED from 04/22/2021 in [facility] ED from 04/21/2021 in [facility] from 03/25/2021 in [facility]  C-SSRS RISK CATEGORY Low Risk No Risk Moderate Risk        Musculoskeletal  Strength & Muscle Tone: within normal limits Gait & Station: normal Patient leans: N/A  Psychiatric Specialty Exam  Presentation  General Appearance: Appropriate for Environment; Casual  Eye Contact:Good  Speech:Clear and Coherent; Normal Rate  Speech Volume:Normal  Handedness:Right   Mood and Affect  Mood:-- (" a lot better")  Affect:Tearful; Congruent; Full Range; Other (comment) (appropriately tearful intermittently)   Thought Process  Thought Processes:Coherent; Goal Directed; Linear  Descriptions of Associations:Intact  Orientation:Full (Time, Place and Person)  Thought Content:Logical; WDL  Diagnosis of Schizophrenia or Schizoaffective disorder in past: No  Duration of Psychotic Symptoms: Less than six months   Hallucinations:Hallucinations: None  Ideas of Reference:None  Suicidal Thoughts:Suicidal Thoughts: Yes, Passive SI Passive Intent and/or Plan: Without Intent; Without Plan  Homicidal  Thoughts:Homicidal Thoughts: No   Sensorium  Memory:Immediate Good; Recent Good; Remote Good  Judgment:Fair  Insight:Fair   Executive Functions  Concentration:Good  Attention Span:Good  Recall:Good  Fund of Knowledge:Good  Language:Good   Psychomotor Activity  Psychomotor Activity:Psychomotor Activity: Normal   Assets  Assets:Communication Skills; Desire for Improvement; Resilience; Physical Health   Sleep  Sleep:Sleep: Good   No data recorded  Physical Exam  Physical Exam Constitutional:      Appearance: Normal appearance. She is normal weight.  HENT:     Head: Normocephalic and atraumatic.  Eyes:     Extraocular Movements: Extraocular movements intact.     Conjunctiva/sclera: Conjunctivae normal.  Cardiovascular:     Rate and Rhythm: Normal rate and regular rhythm.     Heart sounds: Normal heart sounds.  Pulmonary:     Effort: Pulmonary effort is normal. No respiratory distress.     Breath sounds: Normal breath sounds.  Abdominal:     General: Bowel sounds are normal.     Palpations: Abdomen is soft.  Neurological:     General: No focal deficit present.     Mental Status: She is alert and oriented to person, place, and time.   Review of Systems  Constitutional:  Negative for chills and fever.  HENT:  Negative for hearing loss.   Eyes:  Positive for blurred vision. Negative for discharge and redness.       Blurred vision which she states is improving   Respiratory:  Negative for cough.   Cardiovascular:  Negative for chest pain.  Gastrointestinal:  Negative for abdominal pain.  Musculoskeletal:  Negative for myalgias.  Neurological:  Positive for headaches.  Psychiatric/Behavioral:  Positive for depression and substance abuse. Negative for suicidal ideas. The patient is nervous/anxious.   Blood pressure 119/90, pulse 93, temperature (!) 97.3 F (36.3 C), temperature source Tympanic, resp. rate 18, SpO2 99 %. There is no height or weight on file  to calculate BMI.  Treatment Plan Summary: Anaysia Germer is a 46 y.o. 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 She was restarted on her home medications and admitted to the observation unit. UDS+amphetamines, methamphetamine, benzos. Etoh negative.   Patient continues to report low mood and passive SI as recent as this morning. She is agreeable to increasing lexapro from 10 mg to 20 mg for mood/anxiety. She remains appropriate for continued treatment at the [facility] for continued crisis stabilization.    MDD PTSD SIMD/SIPD -continue zyprexa 5 mg qhs -continue gabapentin 300 mg TID -Increase  lexapro 10 mg daily to 20 mg daily (1/30)    Stimulant use disorder -UDS+amphetamines; on chart review, has has tested + for amphetmaines multiples times over the past year -would likely benefit from substance use treatment if amenable--pt is agreeable for residential rehab- referrals made   Dispo: ongoing. Pending improvement in symptomatology. SW assisting. Referrals made to residential rehab facilities.  Estella Husk, MD 04/26/2021 1:45 PM

## 2021-04-26 NOTE — ED Notes (Signed)
Pt is in the bed sleeping. Respirations are even and unlabored. No acute distress noted. Will continue to monitor for safety. 

## 2021-04-26 NOTE — Clinical Social Work Psych Note (Addendum)
[name] was DECLINED for residential treatment services at the following facilities:   [facility] - declined due to being a [facility] resident and having an upcoming court date on 02/01.    [facility] - declined due to having an upcoming court date on 02/01.    [name] has multiple charges she will be addressing on 04/28/2021 in [facility] court.  LCSW and MD will speak with Chlora regarding her upcoming court date and will review alternative discharge planning.   LCSW will continue to follow.     Dr. Serafina Mitchell, MD notified Florina Ou, RN notified

## 2021-04-26 NOTE — ED Notes (Signed)
Materials given for self care, pt verbalized understanding and said she will go through it. Also, she said she's almost done with the material given to her to work on during group this morning and excited working on it

## 2021-04-26 NOTE — Progress Notes (Signed)
Patient attended the group session earlier and was given AA/NA meeting lists.  She then ate lunch and returned to room and is now resting in bed.  Her affect is still constricted and mood dysphoric.  Thought process organized and logical.  She is goal oriented and future directed.  She is focused on rehab aftercare.  Denies avh shi or plan.  Will monitor and encourage her to seek out staff if overwhelmed by thoughts or feelings.

## 2021-04-26 NOTE — Progress Notes (Signed)
LCSW provided [name] with contact information for her attorney, Demetrios Isaacs, [facility].   Julyanna shared that she plans to contact her attorney to determine if she can have her court dates "pushed back" or if she needs to be present, in order to participate in residential treatment services.   Address: [address], [place], [place] [address] Phone Number - [phone]    LCSW will continue to follow   Baldo Daub, MSW, LCSW Clinical Social Worker (Facility Based Crisis) [facility]

## 2021-04-27 DIAGNOSIS — F1510 Other stimulant abuse, uncomplicated: Secondary | ICD-10-CM | POA: Diagnosis not present

## 2021-04-27 DIAGNOSIS — Z20822 Contact with and (suspected) exposure to covid-19: Secondary | ICD-10-CM | POA: Diagnosis not present

## 2021-04-27 DIAGNOSIS — F19951 Other psychoactive substance use, unspecified with psychoactive substance-induced psychotic disorder with hallucinations: Secondary | ICD-10-CM | POA: Diagnosis not present

## 2021-04-27 DIAGNOSIS — F4310 Post-traumatic stress disorder, unspecified: Secondary | ICD-10-CM | POA: Diagnosis not present

## 2021-04-27 MED ORDER — GABAPENTIN 300 MG PO CAPS: 300.0000 mg | ORAL_CAPSULE | Freq: Three times a day (TID) | ORAL | 1 refills | Status: DC

## 2021-04-27 MED ORDER — ESCITALOPRAM OXALATE 20 MG PO TABS: 20.0000 mg | ORAL_TABLET | Freq: Every day | ORAL | 1 refills | Status: DC

## 2021-04-27 MED ORDER — HYDROXYZINE HCL 25 MG PO TABS
25.0000 mg | ORAL_TABLET | Freq: Three times a day (TID) | ORAL | 1 refills | Status: DC | PRN
Start: 2021-04-27 — End: 2021-05-01

## 2021-04-27 MED ORDER — OLANZAPINE 5 MG PO TABS: 5.0000 mg | ORAL_TABLET | Freq: Every day | ORAL | 1 refills | Status: DC

## 2021-04-27 NOTE — Clinical Social Work Psych Note (Signed)
LCSW Progress Note    Yanika continues to express interest in participating in a residential treatment program to maintain sobriety and to begin her recovery process. Tiernan is aware that she has a court date tomorrow morning, 04/28/2021 in [place].   Phoebee contacted her attorney, Milana Na ([place], [place]) to determine whether her court dates can be delayed in order for her to participate in residential substance abuse treatment services.   Hisako was also instructed to contact [facility] to complete phone screening for review. LCSW witnessed Mitzi on the phone with [facility] earlier today.   10:30AM  LCSW faxed a hospital letter to patient's attorney's office to show proof of her admission to the [facility] and to determine whether the patient's court date could be delayed in order for her to participate in treatment.   LCSW  followed up with a phone call and spoke with Bruce Lee's attorney's office receptionist. Per receptionist, once the patient's attorney returned from court, with the required documentation, she would fax it to the [facility] at [phone].    1:48PM  No updates    LCSW will follow up with attorney's office to determine if there have been any changes or updates.   Radonna Ricker, MSW, LCSW Clinical Education officer, museum ([place]) [facility]

## 2021-04-27 NOTE — ED Notes (Signed)
Pt refused her medication, provider notified.

## 2021-04-27 NOTE — ED Notes (Signed)
Pt was very upset and crying because the program she was trying to go to would not take her, she then stated that she was homeless and had no where to go. I gave her a snack and she said that she was feeling better.

## 2021-04-27 NOTE — ED Notes (Signed)
Pt is in the bed sleeping. Respirations are even and unlabored. No acute distress noted. Will continue to monitor for safety. 

## 2021-04-27 NOTE — Progress Notes (Signed)
[name] continued to be appropriate in the milieu, she ate lunch and returned to her room. Later she made the call to [facility] and she was denied acceptance related to having a dual diagnosis. She was tearful, but able to calm herself and eventually returned to her room.

## 2021-04-27 NOTE — ED Notes (Signed)
Pt refused dinner

## 2021-04-27 NOTE — Progress Notes (Signed)
Received [name] this AM at the change of shift asleep in her bed. She woke up around 0730 and requested breakfast, afterwards she returned to her room and got up at 0900 to shower. She received her medications and requested a Vistaril for anxiety. She stated her depression is better, feels anxious related to her upcoming court date and denied feeling suicidal.  She called [facility] at 1040 hrs and Jolan spoke with the person who was on line

## 2021-04-27 NOTE — ED Notes (Signed)
Patient declined to attended AA and wrap up even with encouragement from staff.

## 2021-04-27 NOTE — ED Notes (Signed)
Pt sleeping@this time. Breathing even and unlabored. Will continue to monitor for safety 

## 2021-04-27 NOTE — Discharge Instructions (Addendum)
Transfer to [place] for higher level of care  Please come to [facility] (this facility) during walk in hours for appointment with psychiatrist for further medication management and for therapists for therapy.    Walk in hours are 8-11 AM Monday through Thursday for medication management. Therapy walk in hours are Monday-Wednesday 8 AM-1PM.   It is first come, first -serve; it is best to arrive by 7:00 AM.   On Friday from 1 pm to 4 pm for therapy intake only. Please arrive by 12:00 pm as it is  first come, first -serve.    When you arrive please go upstairs for your appointment. If you are unsure of where to go, inform the front desk that you are here for a walk in appointment and they will assist you with directions upstairs.  Address:  [address] in [place], [address] Ph: [phone]   Substance Abuse Resources  [facility] - Admissions are currently completed Monday through Friday at 8am; both appointments and walk-ins are accepted.  Any individual that is a [facility] resident may present for a substance abuse screening and assessment for admission.  A person may be referred by numerous sources or self-refer.   Potential clients will be screened for medical necessity and appropriateness for the program.  Clients must meet criteria for high-intensity residential treatment services.  If clinically appropriate, a client will continue with the comprehensive clinical assessment and intake process, as well as enrollment in the [facility].   Address: [address] [place], [place] [address] Admin Hours: Mon-Fri 8AM to [facility] Hours: 24/7 Phone: [phone] Fax: [phone]   [facility] (Detox) Facility Based Crisis:  These are 3 locations for services: Please call before arrival    Address: [address] [place], [place] [address] Phone: [phone]   Address: [address] [place], [place] [address] Phone#: [phone]   Address: [address] [place], [place] [address] Phone#: [phone]     [facility] ([facility]): (offers outpatient therapy and intensive outpatient substance abuse therapy).  [address], [place], [place] [address] Phone: [phone]   [facility]: Offers FREE recovery skills classes, support groups, 1:1 Peer Support, and Compeer Classes. [address], [place], [place] [address] Phone: [phone] (Call to complete intake).  [facility] [address] [place], [place] [address] Phone: [phone] ext: [phone] [facility] provides food, shelter and other programs and services to the homeless men of [place] through our [employer].   By offering safe shelter, three meals a day, clean clothing, Biblical counseling, financial planning, vocational training, GED/education and employment assistance, we've helped mend the shattered lives of many homeless men since opening in 1974.   We have approximately 267 beds available, with a max of 312 beds including mats for emergency situations and currently house an average of 270 men a night.   Prospective Client Check-In Information Photo ID Required (State/ Out of State/ [facility]) - if photo ID is not available, clients are required to have a printout of a police/sheriff's criminal history report. Help out with chores around the [facility]. No sex offender of any type (pending, charged, registered and/or any other sex related offenses) will be permitted to check in. Must be willing to abide by all rules, regulations, and policies established by the [employer]. The following will be provided -  shelter, food, clothing, and biblical counseling. If you or someone you know is in need of assistance at our [facility] shelter in [place], [place], please call [phone] ext. [phone].   [facility]-will provide timely access to mental health services for children and adolescents (4-17) and adults presenting in a mental health crisis. The program is designed for those who need urgent Behavioral Health or Substance Use treatment and are not experiencing a medical crisis that would typically require an emergency room visit.    [address] [place], [place] [address] Phone: [phone] [email or web address]   [facility]: Phone#: [phone]   The [facility] SA Intensive Outpatient Program (SAIOP) means structured individual and group addiction activities and services that are provided at an outpatient program designed to assist adult and adolescent consumers to begin recovery and learn skills for recovery maintenance. The [employer] SAIOP program is offered at least 3 hours a day, 3 days a week.SAIOP services shall include a structured program consisting of, but not limited to, the following services: Individual counseling and support; Group counseling and support; Family counseling, training or support; Biochemical assays to identify recent drug use (e.g., urine drug screens); Strategies for relapse prevention to include community and social support systems in treatment; Life skills; Crisis contingency planning; Disease Management; and Treatment support activities that have been adapted or specifically designed for persons with physical disabilities, or persons with co-occurring disorders of mental illness and substance abuse/dependence or mental retardation/developmental disability and substance abuse/dependence. Phone: [phone]     [facility] 24-Hour Call Center: [phone]  Behavioral Health Crisis Line: [phone]   Please present to one of the following facilities to start medication management and therapy services:   [facility] - [facility] [address] [place]   [place], [place] [address] [phone]    omeless Shelter List:     [facility] [place])  [address] [place], [place]  Phone: [phone]     [facility]  [address] [place], [place] [address]  Phone: [phone]     [facility] (Women only)  [address] [place], [place] [address]  Phone: [phone]     [facility]  [address], [place] [address]  Phone: [phone]     [facility]:  [phone]. [address]  [place], [place] [address]  Phone: [phone]     [facility]  [address] [place], [place] [address]  (Check in at 6:00PM for placement at a local shelter)  Phone: [phone]

## 2021-04-27 NOTE — ED Notes (Signed)
Patient refused Group the topic was Mental Health Crisis, it would have covered Managing Feelings along w/worksheet, Coping Skills with worksheet, Tips for staying out of crisis. Patient is the only patient on the floor now, she said she needs to leave has a court date, said she wanted to speak with the nurse, she also said she had group yesterday 1/30, did not see any notes showing she went to group. Gave her the paperwork to do on her own, she said she would.

## 2021-04-27 NOTE — ED Provider Notes (Signed)
FBC/OBS ASAP Discharge Summary  Date and Time: 04/27/2021 4:38 PM  Name: [name]  MRN:  [id number]   Discharge Diagnoses:  Final diagnoses:  PTSD (post-traumatic stress disorder)  Methamphetamine abuse (HCC)  Substance induced mood disorder (HCC)  Substance-induced psychotic disorder with hallucinations (HCC)    Subjective:  Patient seen and chart reviewed-patient has been medication compliant and has been appropriate with staff and peers on the unit.  Patient was declined from some residential rehab facilities due to upcoming court date on 04/28/2021.  Patient has been attempting to call a lawyer to get court date pushed back.  Patient seen in conjunction with social work this morning.  Patient initially displays bright affect and states that she was able to get in contact with her lawyer this morning and states that her lawyer needs a letter from providers here to push her court date back and describes looking forward to residential treatment.  Patient is informed that she has a phone screening with [facility] today.  Later in the afternoon, received news that she was declined by [facility]-patient expressed disappointment about this.     Discussed with patient that she has a court date on 04/28/2021 and [facility] and recommended that she attend this court date.  Patient verbalized understanding but discussed her concerns for homelessness.  Discussed with patient that resources can be provided.  Encouraged patient to contact [facility] after she has her court date resolved to see if he can review her.  No SI/HI/AVH.   Stay Summary:  [name] is a 46 y.o. female with a history of depression, anxiety, PTSD, substance induced mood disorder and methamphetamine abuse who presents voluntarily to [facility] with law enforcement due to SI and AVH on 04/22/21. She was restarted on her home medications and admitted to the observation unit. UDS+amphetamines, methamphetamine, benzos. Etoh negative. The following  day 1/27, patient admitted to the [facility]. Upon admission to the observation unit, she was restarted on her home medications of lexapro 10 mg, gabapentin 300 mg tID, and zyprexa 5 mg qhs. On 1/30, lexapro was increased to 20 mg daily. Patient tolerated increase without se/ae.    On my interview today, no SI/HI/AVH.  She is somewhat upset about not being accepted to residential rehab due to having court date tomorrow but verbalizes understanding and is aware that if she does not present for her court date tomorrow she will get a failure to appear and there will be a warrant out for her arrest.  When patient was seen in conjunction with social work, discussed that she will be provided with a taxi voucher to her court date tomorrow and that she will be provided resources for residential rehab and shelters.    Overall, patient appears to be at the point, in the absence of inhibiting or disinhibiting symptoms, where she can successfully move to lesser restrictive setting for care.       Total Time spent with patient: 20 minutes  Past Psychiatric History: PTSD, MDD, substance use, SIMD Past Medical History:  Past Medical History:  Diagnosis Date   Anxiety    Arthritis    Asthma    last flareup was yr or so with bronchitis   Bipolar disorder (HCC)    Carpal tunnel syndrome    COPD (chronic obstructive pulmonary disease) (HCC)    Depressed    Dysrhythmia    'skipped beats every now and then"   Emphysema    does smoke, and has slowed down  Fibromyalgia    Fibromyalgia, primary    GERD (gastroesophageal reflux disease)    periodic indigestion   Hepatitis C    Hypertension    dx a couple of months ago   Kidney stone    Manic depressive disorder (HCC)    Nephrolithiasis    PTSD (post-traumatic stress disorder)    UTI (lower urinary tract infection)     Past Surgical History:  Procedure Laterality Date   ABDOMINAL HYSTERECTOMY     ANTERIOR CERVICAL DECOMP/DISCECTOMY FUSION N/A 04/22/2015    Procedure: Cervical 4-5, Cervical 5-6 Anterior Cervical Discectomy and Fusion, Allograft, Plate;  Surgeon: Eldred MangesMark C Yates, MD;  Location: [facility] OR;  Service: Orthopedics;  Laterality: N/A;   CARPAL TUNNEL RELEASE     right hand   ORIF ANKLE FRACTURE  02/03/2012   Procedure: OPEN REDUCTION INTERNAL FIXATION (ORIF) ANKLE FRACTURE;  Surgeon: Nadara MustardMarcus V Duda, MD;  Location: [facility] OR;  Service: Orthopedics;  Laterality: Right;  Open Reduction Internal Fixation Right Ankle   ORIF CLAVICULAR FRACTURE Left 08/29/2012   Dr [name]   ORIF CLAVICULAR FRACTURE Left 08/29/2012   Procedure: OPEN REDUCTION INTERNAL FIXATION (ORIF) CLAVICULAR FRACTURE;  Surgeon: Eldred MangesMark C Yates, MD;  Location: [facility] OR;  Service: Orthopedics;  Laterality: Left;  Open Reduction Internal Fixation Left Clavicle Fracture   SPLENECTOMY, 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 Father history of drug abuse, brother and sister withbipolar disorder.  Social History:  Social History:  Social History   Substance and Sexual Activity  Alcohol Use Yes   Comment: occ mixed drink or liquor     Social History   Substance and Sexual Activity  Drug Use Not Currently   Types: Marijuana   Comment: last marijuana use 05/27/2019    Social History   Socioeconomic History   Marital status: Single    Spouse name: Not on file   Number of children: 4   Years of education: 8th   Highest education level: Not on file  Occupational History     Employer: [employer]    Comment: [employer]  Tobacco Use   Smoking status: Every Day    Packs/day: 1.00    Years: 28.00    Pack years: 28.00    Types: Cigarettes    Start date: 03/28/1990   Smokeless tobacco: Never  Vaping Use   Vaping Use: Never used  Substance and Sexual Activity   Alcohol use: Yes    Comment: occ mixed drink or liquor   Drug use: Not Currently    Types: Marijuana    Comment: last marijuana use 05/27/2019   Sexual activity: Not on file  Other Topics Concern   Not on file  Social History Narrative   ** Merged History Encounter **       As of 02/20/12:  Completed some high school. Has a fiance Conley Rolls([place]) who  brings her to appointments. Unemployed. No regular exercise. Feels safe in her relationships  Has smoked cigarettes since she was 46 years old, currently doing 1/2 to 1 pack    every two days. Denies recreational drug use currently or a history of ever using IV drugs. Occasionally drinks a couple drinks about twice per week. There was a time where she drank 2-3 drinks either every day or every other day for about three years du   ring 2009-2012.  Note: after reviewing prior records, patient has had multiple admissions to behavioral health, several of which mention alcohol abuse, and at least one of which was for alcohol detoxification. Patient did not share this information duri   ng our visit.  Has four children (ages 59, 72, 7, 18). The 74 year old lives with pt's sister in [place] so that they can continue to go to school at the same place. The 45 year old recently moved in with her two months ago, but had previously    been living with pt's sister as well (moved in as there was conflict between child & pt's sister). The other two kids are adults and live on their own.  Caffeine Use: 1-2 cups daily   Social Determinants of Health   Financial Resource Strain: Not on file  Food Insecurity: Not on file  Transportation Needs: Not on  file  Physical Activity: Not on file  Stress: Not on file  Social Connections: Not on file   SDOH:  SDOH Screenings   Alcohol Screen: Not on file  Depression (PHQ2-9): Not on file  Financial Resource Strain: Not on file  Food Insecurity: Not on file  Housing: Not on file  Physical Activity: Not on file  Social Connections: Not on file  Stress: Not on file  Tobacco Use: High Risk   Smoking Tobacco Use: Every Day   Smokeless Tobacco Use: Never   Passive Exposure: Not on file  Transportation Needs: Not on file    Tobacco Cessation:  Prescription not provided because: n/a  Current Medications:  Current Facility-Administered Medications  Medication Dose Route Frequency Provider Last Rate Last Admin   acetaminophen (TYLENOL) tablet 650 mg  650 mg Oral Q6H PRN Nira Conn A, NP   650 mg at 04/26/21 1144   alum & mag hydroxide-simeth (MAALOX/MYLANTA) 200-200-20 MG/5ML suspension 30 mL  30 mL Oral Q4H PRN Nira Conn A, NP       escitalopram (LEXAPRO) tablet 20 mg  20 mg Oral Daily Estella Husk, MD   20 mg at 04/27/21 1610   gabapentin (NEURONTIN) capsule 300 mg  300 mg Oral TID Nira Conn A, NP   300 mg at 04/27/21 1552   hydrOXYzine (ATARAX) tablet 25 mg  25 mg Oral TID PRN Jackelyn Poling, NP   25 mg at 04/27/21 0936   magnesium hydroxide (MILK OF MAGNESIA) suspension 30 mL  30 mL Oral Daily PRN Jackelyn Poling, NP       OLANZapine (ZYPREXA) tablet 5 mg  5 mg Oral QHS Nira Conn A, NP   5 mg at 04/26/21 2139   traZODone (DESYREL) tablet 50 mg  50 mg Oral QHS PRN Jackelyn Poling, NP       Current Outpatient Medications  Medication Sig Dispense Refill   [START ON 04/28/2021] escitalopram (LEXAPRO) 20 MG tablet Take 1 tablet (20 mg total) by mouth daily. 30 tablet 1   gabapentin (NEURONTIN) 300 MG capsule Take 1 capsule (300 mg total)  by mouth 3 (three) times daily. 90 capsule 1   hydrOXYzine (ATARAX) 25 MG tablet Take 1 tablet (25 mg total) by mouth 3 (three) times daily as  needed for anxiety. 30 tablet 1   OLANZapine (ZYPREXA) 5 MG tablet Take 1 tablet (5 mg total) by mouth at bedtime. 30 tablet 1    PTA Medications: (Not in a hospital admission)   Musculoskeletal  Strength & Muscle Tone: within normal limits Gait & Station: normal Patient leans: N/A  Psychiatric Specialty Exam  Presentation  General Appearance: Appropriate for Environment; Casual  Eye Contact:Good  Speech:Clear and Coherent; Normal Rate  Speech Volume:Normal  Handedness:Right   Mood and Affect  Mood:Dysphoric  Affect:Appropriate; Congruent; Other (comment) (intermittently appropriately tearful)   Thought Process  Thought Processes:Coherent; Goal Directed; Linear  Descriptions of Associations:Intact  Orientation:Full (Time, Place and Person)  Thought Content:Logical; WDL  Diagnosis of Schizophrenia or Schizoaffective disorder in past: No  Duration of Psychotic Symptoms: Less than six months   Hallucinations:Hallucinations: None  Ideas of Reference:None  Suicidal Thoughts:Suicidal Thoughts: No SI Passive Intent and/or Plan: Without Intent; Without Plan  Homicidal Thoughts:Homicidal Thoughts: No   Sensorium  Memory:Immediate Good; Recent Good; Remote Good  Judgment:Fair  Insight:Fair   Executive Functions  Concentration:Good  Attention Span:Good  Recall:Good  Fund of Knowledge:Good  Language:Good   Psychomotor Activity  Psychomotor Activity:Psychomotor Activity: Normal   Assets  Assets:Communication Skills; Desire for Improvement; Resilience; Physical Health   Sleep  Sleep:Sleep: Fair   No data recorded  Physical Exam  Physical Exam Constitutional:      Appearance: Normal appearance. She is normal weight.  HENT:     Head: Normocephalic and atraumatic.  Pulmonary:     Effort: Pulmonary effort is normal.  Neurological:     Mental Status: She is alert and oriented to person, place, and time.   Review of Systems   Constitutional:  Negative for chills and fever.  HENT:  Negative for hearing loss.   Eyes:  Negative for discharge and redness.  Respiratory:  Negative for cough.   Cardiovascular:  Negative for chest pain.  Gastrointestinal:  Negative for abdominal pain.  Musculoskeletal:  Negative for myalgias.  Neurological:  Negative for headaches.  Blood pressure 111/86, pulse 82, temperature (!) 97.3 F (36.3 C), temperature source Temporal, resp. rate 16, SpO2 100 %. There is no height or weight on file to calculate BMI.  Demographic Factors:  Caucasian, Low socioeconomic status, and Unemployed  Loss Factors: Decrease in vocational status, Legal issues, and Financial problems/change in socioeconomic status  Historical Factors: Prior suicide attempts and Impulsivity  Risk Reduction Factors:   Sense of responsibility to family, Religious beliefs about death, and hope for the future, future oriented, focused on going to residential substance use treatment in the future  Continued Clinical Symptoms:  Depression:   Comorbid alcohol abuse/dependence Impulsivity Alcohol/Substance Abuse/Dependencies Previous Psychiatric Diagnoses and Treatments  Cognitive Features That Contribute To Risk:  None    Suicide Risk:  Mild:  Suicidal ideation of limited frequency, intensity, duration, and specificity.  There are no identifiable plans, no associated intent, mild dysphoria and related symptoms, good self-control (both objective and subjective assessment), few other risk factors, and identifiable protective factors, including available and accessible social support.  Plan Of Care/Follow-up recommendations:  Activity:  as tolerated Diet:  regular Other:  self care  Take all medications as prescribed by his/her mental healthcare provider. Report any adverse effects and or reactions from the medicines to your outpatient provider  promptly. Do not engage in alcohol and or illegal drug use while on  prescription medicines. In the event of worsening symptoms, call the crisis hotline, 911 and or go to the nearest ED for appropriate evaluation and treatment of symptoms. follow-up with your primary care provider for your other medical issues, concerns and or health care needs.  Allergies as of 04/27/2021       Reactions   Sulfa Antibiotics Anaphylaxis, Rash   Eggs Or Egg-derived Products Hives   Milk-related Compounds Hives        Medication List     STOP taking these medications    benztropine 1 MG tablet Commonly known as: COGENTIN   hydrOXYzine 50 MG capsule Commonly known as: VISTARIL   lamoTRIgine 25 MG tablet Commonly known as: LAMICTAL       TAKE these medications    escitalopram 20 MG tablet Commonly known as: LEXAPRO Take 1 tablet (20 mg total) by mouth daily. Start taking on: April 28, 2021 What changed:  medication strength how much to take when to take this   gabapentin 300 MG capsule Commonly known as: NEURONTIN Take 1 capsule (300 mg total) by mouth 3 (three) times daily. What changed:  when to take this reasons to take this   hydrOXYzine 25 MG tablet Commonly known as: ATARAX Take 1 tablet (25 mg total) by mouth 3 (three) times daily as needed for anxiety.   OLANZapine 5 MG tablet Commonly known as: ZYPREXA Take 1 tablet (5 mg total) by mouth at bedtime.        Patient was provided with 7 day samples of above medications as well as paper prescriptions for 30 days with one refill at time of discharge.  Patient was provided with follow up information regarding psychiatric outpatient resources in [place] with the assistance of SW prior to discharge.      [place] morning of 04/28/21  Estella HuskKatherine S Shamyia Grandpre, MD 04/27/2021, 4:38 PM

## 2021-04-27 NOTE — ED Notes (Signed)
Refused to attend AA meeting 

## 2021-04-28 ENCOUNTER — Other Ambulatory Visit: Payer: Self-pay

## 2021-04-28 ENCOUNTER — Encounter (HOSPITAL_COMMUNITY): Payer: Self-pay | Admitting: Psychiatry

## 2021-04-28 ENCOUNTER — Inpatient Hospital Stay (HOSPITAL_COMMUNITY)
Admission: AD | Admit: 2021-04-28 | Discharge: 2021-05-01 | DRG: 885 | Disposition: A | Discharge status: Home / Self Care | Payer: Federal, State, Local not specified - Other | Source: Intra-hospital | Attending: Emergency Medicine | Admitting: Emergency Medicine

## 2021-04-28 DIAGNOSIS — Z813 Family history of other psychoactive substance abuse and dependence: Secondary | ICD-10-CM | POA: Diagnosis not present

## 2021-04-28 DIAGNOSIS — Z818 Family history of other mental and behavioral disorders: Secondary | ICD-10-CM | POA: Diagnosis not present

## 2021-04-28 DIAGNOSIS — Z9081 Acquired absence of spleen: Secondary | ICD-10-CM | POA: Diagnosis not present

## 2021-04-28 DIAGNOSIS — F411 Generalized anxiety disorder: Secondary | ICD-10-CM | POA: Diagnosis present

## 2021-04-28 DIAGNOSIS — J439 Emphysema, unspecified: Secondary | ICD-10-CM | POA: Diagnosis present

## 2021-04-28 DIAGNOSIS — Z811 Family history of alcohol abuse and dependence: Secondary | ICD-10-CM

## 2021-04-28 DIAGNOSIS — Z8249 Family history of ischemic heart disease and other diseases of the circulatory system: Secondary | ICD-10-CM | POA: Diagnosis not present

## 2021-04-28 DIAGNOSIS — Z79899 Other long term (current) drug therapy: Secondary | ICD-10-CM | POA: Diagnosis not present

## 2021-04-28 DIAGNOSIS — F333 Major depressive disorder, recurrent, severe with psychotic symptoms: Secondary | ICD-10-CM | POA: Diagnosis present

## 2021-04-28 DIAGNOSIS — F4310 Post-traumatic stress disorder, unspecified: Secondary | ICD-10-CM | POA: Diagnosis present

## 2021-04-28 DIAGNOSIS — Z6372 Alcoholism and drug addiction in family: Secondary | ICD-10-CM

## 2021-04-28 DIAGNOSIS — F332 Major depressive disorder, recurrent severe without psychotic features: Secondary | ICD-10-CM | POA: Diagnosis present

## 2021-04-28 DIAGNOSIS — M797 Fibromyalgia: Secondary | ICD-10-CM | POA: Diagnosis present

## 2021-04-28 DIAGNOSIS — R45851 Suicidal ideations: Secondary | ICD-10-CM | POA: Diagnosis present

## 2021-04-28 DIAGNOSIS — F19951 Other psychoactive substance use, unspecified with psychoactive substance-induced psychotic disorder with hallucinations: Secondary | ICD-10-CM | POA: Diagnosis not present

## 2021-04-28 DIAGNOSIS — F1410 Cocaine abuse, uncomplicated: Secondary | ICD-10-CM | POA: Diagnosis present

## 2021-04-28 DIAGNOSIS — Z5900 Homelessness unspecified: Secondary | ICD-10-CM | POA: Diagnosis not present

## 2021-04-28 DIAGNOSIS — F1590 Other stimulant use, unspecified, uncomplicated: Secondary | ICD-10-CM

## 2021-04-28 DIAGNOSIS — F17210 Nicotine dependence, cigarettes, uncomplicated: Secondary | ICD-10-CM | POA: Diagnosis present

## 2021-04-28 DIAGNOSIS — I10 Essential (primary) hypertension: Secondary | ICD-10-CM | POA: Diagnosis present

## 2021-04-28 DIAGNOSIS — F1010 Alcohol abuse, uncomplicated: Secondary | ICD-10-CM | POA: Diagnosis present

## 2021-04-28 DIAGNOSIS — Z20822 Contact with and (suspected) exposure to covid-19: Secondary | ICD-10-CM | POA: Diagnosis not present

## 2021-04-28 DIAGNOSIS — Z882 Allergy status to sulfonamides status: Secondary | ICD-10-CM | POA: Diagnosis not present

## 2021-04-28 DIAGNOSIS — Z841 Family history of disorders of kidney and ureter: Secondary | ICD-10-CM | POA: Diagnosis not present

## 2021-04-28 DIAGNOSIS — F1510 Other stimulant abuse, uncomplicated: Secondary | ICD-10-CM | POA: Diagnosis present

## 2021-04-28 DIAGNOSIS — Z981 Arthrodesis status: Secondary | ICD-10-CM | POA: Diagnosis not present

## 2021-04-28 LAB — RESP PANEL BY RT-PCR (FLU A&B, COVID) ARPGX2
Influenza A by PCR: NEGATIVE
Influenza B by PCR: NEGATIVE
SARS Coronavirus 2 by RT PCR: NEGATIVE

## 2021-04-28 MED ORDER — OLANZAPINE 5 MG PO TBDP
5.0000 mg | ORAL_TABLET | Freq: Once | ORAL | Status: AC
  Administered 2021-04-28: 5 mg via ORAL
  Filled 2021-04-28: qty 1

## 2021-04-28 MED ORDER — MAGNESIUM HYDROXIDE 400 MG/5ML PO SUSP: 30.0000 mL | Freq: Every day | ORAL | Status: DC | PRN

## 2021-04-28 MED ORDER — GABAPENTIN 300 MG PO CAPS
300.0000 mg | ORAL_CAPSULE | Freq: Three times a day (TID) | ORAL | Status: DC
  Administered 2021-04-28 – 2021-05-01 (×9): 300 mg via ORAL
  Filled 2021-04-28 (×3): qty 1
  Filled 2021-04-28: qty 21
  Filled 2021-04-28 (×7): qty 1
  Filled 2021-04-28: qty 21
  Filled 2021-04-28: qty 1
  Filled 2021-04-28: qty 21
  Filled 2021-04-28: qty 1

## 2021-04-28 MED ORDER — HYDROXYZINE HCL 25 MG PO TABS
25.0000 mg | ORAL_TABLET | Freq: Three times a day (TID) | ORAL | Status: DC | PRN
  Administered 2021-04-28 – 2021-05-01 (×6): 25 mg via ORAL
  Filled 2021-04-28 (×5): qty 1
  Filled 2021-04-28: qty 10
  Filled 2021-04-28: qty 1

## 2021-04-28 MED ORDER — ACETAMINOPHEN 325 MG PO TABS
650.0000 mg | ORAL_TABLET | Freq: Four times a day (QID) | ORAL | Status: DC | PRN
  Administered 2021-04-28 – 2021-05-01 (×2): 650 mg via ORAL
  Filled 2021-04-28 (×2): qty 2

## 2021-04-28 MED ORDER — TRAZODONE HCL 50 MG PO TABS
50.0000 mg | ORAL_TABLET | Freq: Every evening | ORAL | Status: DC | PRN
  Administered 2021-04-28 – 2021-04-30 (×3): 50 mg via ORAL
  Filled 2021-04-28 (×2): qty 1
  Filled 2021-04-28: qty 7
  Filled 2021-04-28: qty 1

## 2021-04-28 MED ORDER — OLANZAPINE 5 MG PO TABS
5.0000 mg | ORAL_TABLET | Freq: Every day | ORAL | Status: DC
  Administered 2021-04-28: 5 mg via ORAL
  Filled 2021-04-28 (×4): qty 1

## 2021-04-28 MED ORDER — ESCITALOPRAM OXALATE 10 MG PO TABS
20.0000 mg | ORAL_TABLET | Freq: Every day | ORAL | Status: DC
  Administered 2021-04-28 – 2021-04-29 (×2): 20 mg via ORAL
  Filled 2021-04-28 (×2): qty 2
  Filled 2021-04-28 (×2): qty 1
  Filled 2021-04-28: qty 2

## 2021-04-28 MED ORDER — ALUM & MAG HYDROXIDE-SIMETH 200-200-20 MG/5ML PO SUSP: 30.0000 mL | ORAL | Status: DC | PRN

## 2021-04-28 MED ORDER — GABAPENTIN 300 MG PO CAPS: 300.0000 mg | ORAL_CAPSULE | Freq: Once | ORAL | Status: DC

## 2021-04-28 NOTE — Progress Notes (Signed)
[facility] LCSW Progress Note   04/28/2021    11:35 AM  [name]   [id number]   Type of Contact and Topic:  Psychiatric Bed Placement   Pt accepted to [facility] 405-2  Patient meets inpatient criteria per Dr. Bronwen Betters  The attending provider will be Bartholomew Crews, MD   Call report to [phone]    [name], RN @ [facility] notified.     Pt scheduled  to arrive at [facility] TODAY at 1500.     Damita Dunnings, MSW, LCSW-A  11:37 AM 04/28/2021

## 2021-04-28 NOTE — Clinical Social Work Psych Note (Addendum)
LCSW Progress/Discharge Note     Anissia has been accepted to [facility] for inpatient psychiatric treatment services.   [place]sel has been accepted to Room, 405-2 and can arrive at 1500.   Accepting and Attending Provider: Dr. Mason Jim  Nurse to Nurse Report # - [phone]  Please fax the patient's voluntary conset form to [phone].     Dr. Bronwen Betters, MD notified Ryta Marquis Lunch, RN notified   LCSW signing off.   Baldo Daub, MSW, LCSW Clinical Child psychotherapist (Facility Based Crisis) [facility]
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
## 2021-04-28 NOTE — ED Notes (Signed)
Pt is in the bed sleeping. Respirations are even and unlabored. No acute distress noted. Will continue to monitor for safety. 
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
## 2021-04-28 NOTE — Tx Team (Signed)
Initial Treatment Plan 04/28/2021 6:08 PM TYJA GORTNEY YME:[id number]    PATIENT STRESSORS: Financial difficulties   Health problems   Occupational concerns   Substance abuse     PATIENT STRENGTHS: Motivation for treatment/growth    PATIENT IDENTIFIED PROBLEMS: AVH  Substance abuse  Money/no job  Homeless  Children             DISCHARGE CRITERIA:  Adequate post-discharge living arrangements Motivation to continue treatment in a less acute level of care Withdrawal symptoms are absent or subacute and managed without 24-hour nursing intervention  PRELIMINARY DISCHARGE PLAN: Attend PHP/IOP Attend 12-step recovery group Placement in alternative living arrangements  PATIENT/FAMILY INVOLVEMENT: This treatment plan has been presented to and reviewed with the patient, CRYSTALLYNN NOORANI.  The patient has been given the opportunity to ask questions and make suggestions.  Sofie Hartigan, RN 04/28/2021, 6:08 PM

## 2021-04-28 NOTE — ED Notes (Signed)
[name] to be transferred  to [facility]  per MD order. Discussed with the patient and all questions fully answered.  An EMTALA was printed and to be given to the receiving nurse. Patient escorted out and transferred via safe transport.  [name]  04/28/2021 3:36 PM

## 2021-04-28 NOTE — ED Notes (Signed)
Pt refused to come to group today, she was laying in the hallway

## 2021-04-28 NOTE — Progress Notes (Signed)
Pt is wake, alert and oriented. Pt did not voice any complaints of pain or discomfort. No signs of acute distress noted. Administered scheduled meds with no incident. Pt denies current SI/HI/AVH. Staff will monitor for pt's safety

## 2021-04-28 NOTE — Clinical Social Work Psych Note (Signed)
Depression  Diagnosis: Depression (Psychoeducational)  Date: 04/28/21  Type of Therapy/Therapeutic Modalities: Group Discussion, Psycho-Education; CBT; Motivational Interviewing   Participation Level: Active  Objective: The purpose of this group is to educate patients on the various components of depression and how it can influence and/or contribute to severe symptoms that affect how you feel, think, and handle daily activities, such as sleeping, eating, or working.  Therapeutic Goals:  Patient will learn the foundations of depression, including its definition and the various types of depression disorders.  Patient will discuss with group members their personal experiences with depression and how it has impacted their lives Patient will learn various cognitive behavioral therapy (CBT) techniques that are effective in treating anxiety symptoms. Patient will discuss with group members how they plan to address and/or maintain their depressive symptoms moving forward.   Summary of Patients Progress:  [name] was engaged and participated throughout the group session. [name] contributed to the group's discussion.   [name] reviewed the therapeutic goals and objective listed above and completed a worksheet challenging them to identify protective factors for managing her depressive symptoms.

## 2021-04-28 NOTE — Progress Notes (Signed)
Patient attended group and was appropriate.

## 2021-04-28 NOTE — ED Notes (Signed)
Pt came out of the room and sat on couch in the hallway of [facility] and said she was hallucinating and she cannot be around people. She then stated she will jump in front of a moving car. We asked her again just to make sure she was acknowledging she has SI with a plan to jump in front of a moving car. She said the way I was treated last night I don't want to be around anymore,we explained she did not come out of her room and talk to any staff she refused AA meeting and also refused her medication. She has not completed any of her meetings since she has been here, nor has she completed any of her hygiene. She will take her medication every night with out any problems but we have been trying to get her to moving and come out of her room she has declined. I called Cody NP and told him pt was threatening SI and he said to let her back in her room and cancel cab due to the SI and AH. Report will be given to day shift of pt not being d/c and for them to pass on to the NP on staff

## 2021-04-28 NOTE — Progress Notes (Signed)
Pt swabbed for Covid PCR. Offsite distribution called for STAT pick up.

## 2021-04-28 NOTE — Progress Notes (Signed)
Vistaril was administered to pt at 0758 due pt reports of anxiety and thoughts of self harm. Pt informed this Clinical research associate that she is not feeling good and is having heart palpitations. Pt stated " the medicine is having the opposite effect."  Vitals checked BP 122/87, Pulse 82 SP02 100% RA. MD notified in person. No new orders.

## 2021-04-28 NOTE — ED Notes (Signed)
Report given to Dimple Nanas [place]

## 2021-04-28 NOTE — Progress Notes (Signed)
Pt refused to attend group.

## 2021-04-29 MED ORDER — LAMOTRIGINE 25 MG PO TABS
25.0000 mg | ORAL_TABLET | Freq: Every day | ORAL | Status: DC
  Administered 2021-04-30 – 2021-05-01 (×2): 25 mg via ORAL
  Filled 2021-04-29 (×4): qty 1

## 2021-04-29 MED ORDER — OLANZAPINE 10 MG PO TABS
10.0000 mg | ORAL_TABLET | Freq: Every day | ORAL | Status: DC
  Administered 2021-04-29 – 2021-04-30 (×2): 10 mg via ORAL
  Filled 2021-04-29: qty 1
  Filled 2021-04-29: qty 7
  Filled 2021-04-29 (×3): qty 1

## 2021-04-29 MED ORDER — ESCITALOPRAM OXALATE 10 MG PO TABS
10.0000 mg | ORAL_TABLET | Freq: Every day | ORAL | Status: DC
  Filled 2021-04-29 (×2): qty 1

## 2021-04-29 MED ORDER — ESCITALOPRAM OXALATE 20 MG PO TABS
20.0000 mg | ORAL_TABLET | Freq: Every day | ORAL | Status: DC
  Administered 2021-04-30 – 2021-05-01 (×2): 20 mg via ORAL
  Filled 2021-04-29: qty 1
  Filled 2021-04-29: qty 7
  Filled 2021-04-29 (×2): qty 1
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
## 2021-04-29 NOTE — Progress Notes (Signed)
D:  Tanairy was up and visible on the unit.  She denied any complaints or questions at this time.  She denied SI/HI but continues with AVH but no changes since last assessment.  She was given Trazodone for sleep with good relief.  She did attend evening NA group and has been seen interacting well with her roommate.   A:  1:1 with RN for support and encouragement.  Medications given as ordered.  Q 15 minute checks maintained for safety.  Encouraged participation in group and unit activities.   R:  She is currently resting with her eyes closed and appears to be asleep.  She remains safe on the unit.  We will continue to monitor the progress towards her goals.

## 2021-04-29 NOTE — BHH Suicide Risk Assessment (Signed)
BHH INPATIENT:  Family/Significant Other Suicide Prevention Education  Suicide Prevention Education:  Education Completed; Olen Cordial, [phone] (name of family member/significant other) has been identified by the patient as the family member/significant other with whom the patient will be residing, and identified as the person(s) who will aid the patient in the event of a mental health crisis (suicidal ideations/suicide attempt).  With written consent from the patient, the family member/significant other has been provided the following suicide prevention education, prior to the and/or following the discharge of the patient.  CSW spoke with patient friend, Jonny Ruiz, who reports that patient was living with her mother and aunt until her mother passed away and her aunt kicked her out.  Friend reports that she has never tried to be self sufficient and became homeless and asked him if she could stay with him.  John allowed her to stay for 30 days but then she started to act "weird" and doing things that he didn't feel was appropriate in his environment.  Patient would borrow his roommates car and she would not return and he would have to drive his roommate around to look for it.  Friend reports patient has a lot of childhood trauma and needs medications for her mental health.  He could not speak about any current substance use but feels like she is now homeless and nowhere to go so she is trying to find a place to stay.  Friend does not believe patient would hurt herself and doesn't believe he has access to a gun but he can't speak about who she hangs out with.   The suicide prevention education provided includes the following: Suicide risk factors Suicide prevention and interventions National Suicide Hotline telephone number [facility] assessment telephone number [facility] Emergency Assistance 911 [facility] and/or Residential Mobile Crisis Unit telephone number  Request made of  family/significant other to: Remove weapons (e.g., guns, rifles, knives), all items previously/currently identified as safety concern.   Remove drugs/medications (over-the-counter, prescriptions, illicit drugs), all items previously/currently identified as a safety concern.  The family member/significant other verbalizes understanding of the suicide prevention education information provided.  The family member/significant other agrees to remove the items of safety concern listed above.  Huxley Shurley E Alveta Quintela 04/29/2021, 2:49 PM

## 2021-04-29 NOTE — Progress Notes (Signed)
°   04/29/21 0703  Sleep  Number of Hours 6.75

## 2021-04-29 NOTE — Progress Notes (Signed)
Dar Note: Patient presents with anxious affect and mood.  Denies suicidal thoughts but reports AVH.  Medications given as prescribed.  Routine safety checks maintained.  Patient visible in milieu interacting with staff and peers. Requested and given Vistaril 25 mg for complain of anxiety with good effect.  Patient was preoccupied and concerned with her medication regimen.  Patient educated and given the list of her prescribed medications.  Verbalizes understanding.  Patient is safe on and off the unit.
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
## 2021-04-29 NOTE — [facility] Group Notes (Signed)
[facility] Group Notes:  (Nursing/MHT/Case Management/Adjunct)  Date:  04/29/2021  Time:  8:31 PM  Type of Therapy:  Wrap up group  Participation Level:  Active  Participation Quality:  Appropriate  Affect:  Appropriate and Excited  Cognitive:  Alert, Appropriate, and Disorganized  Insight:  Good  Engagement in Group:  Engaged and Supportive  Modes of Intervention:  Discussion  Summary of Progress/Problems: Goal for the day was to slow down and focus on the day at hand. Pt states she met this goal.  Chase Picket 04/29/2021, 8:31 PM

## 2021-04-29 NOTE — Progress Notes (Signed)
° ° °   04/29/21 2108  Psych Admission Type (Psych Patients Only)  Admission Status Voluntary  Psychosocial Assessment  Patient Complaints Anxiety;Depression  Eye Contact Fair  Facial Expression Sad  Affect Anxious;Sad;Preoccupied  Speech Logical/coherent;Rapid  Interaction Assertive;Attention-seeking  Motor Activity Other (Comment)  Appearance/Hygiene Unremarkable  Behavior Characteristics Cooperative  Mood Anxious  Thought Process  Coherency WDL  Content Blaming others;Blaming self  Delusions Paranoid  Perception Hallucinations  Hallucination None reported or observed  Judgment Poor  Confusion None  Danger to Self  Current suicidal ideation? Denies  Self-Injurious Behavior No self-injurious ideation or behavior indicators observed or expressed   Agreement Not to Harm Self Yes  Description of Agreement verbal  Danger to Others  Danger to Others None reported or observed

## 2021-04-29 NOTE — Plan of Care (Signed)
°  Problem: Education: °Goal: Verbalization of understanding the information provided will improve °Outcome: Progressing °  °Problem: Education: °Goal: Emotional status will improve °Outcome: Not Progressing °Goal: Mental status will improve °Outcome: Not Progressing °  °

## 2021-04-30 ENCOUNTER — Encounter (HOSPITAL_COMMUNITY): Payer: Self-pay

## 2021-04-30 DIAGNOSIS — F333 Major depressive disorder, recurrent, severe with psychotic symptoms: Principal | ICD-10-CM

## 2021-04-30 DIAGNOSIS — F1590 Other stimulant use, unspecified, uncomplicated: Secondary | ICD-10-CM

## 2021-04-30 LAB — CBC
HCT: 39.2 % (ref 36.0–46.0)
Hemoglobin: 12.8 g/dL (ref 12.0–15.0)
MCH: 31.3 pg (ref 26.0–34.0)
MCHC: 32.7 g/dL (ref 30.0–36.0)
MCV: 95.8 fL (ref 80.0–100.0)
Platelets: 519 10*3/uL — ABNORMAL HIGH (ref 150–400)
RBC: 4.09 MIL/uL (ref 3.87–5.11)
RDW: 12.9 % (ref 11.5–15.5)
WBC: 8.4 10*3/uL (ref 4.0–10.5)
nRBC: 0 % (ref 0.0–0.2)

## 2021-04-30 LAB — COMPREHENSIVE METABOLIC PANEL
ALT: 35 U/L (ref 0–44)
AST: 36 U/L (ref 15–41)
Albumin: 3.4 g/dL — ABNORMAL LOW (ref 3.5–5.0)
Alkaline Phosphatase: 74 U/L (ref 38–126)
Anion gap: 6 (ref 5–15)
BUN: 12 mg/dL (ref 6–20)
CO2: 26 mmol/L (ref 22–32)
Calcium: 8.9 mg/dL (ref 8.9–10.3)
Chloride: 104 mmol/L (ref 98–111)
Creatinine, Ser: 0.61 mg/dL (ref 0.44–1.00)
GFR, Estimated: >60 mL/min (ref >60)
Glucose, Bld: 87 mg/dL (ref 70–99)
Potassium: 4.3 mmol/L (ref 3.5–5.1)
Sodium: 136 mmol/L (ref 135–145)
Total Bilirubin: 0.1 mg/dL — ABNORMAL LOW (ref 0.3–1.2)
Total Protein: 6.6 g/dL (ref 6.5–8.1)

## 2021-04-30 NOTE — [facility] Group Notes (Signed)
Pt attended and contributed to goals group. °

## 2021-04-30 NOTE — [facility] Counselor (Signed)
CSW met with patient and provided her resources of [facility] as a possible rehab facility.  Patient received pamphlet and agreed to call for intake appointment.    Alyshia Kernan, LCSW, [place] Social Worker  [facility]

## 2021-04-30 NOTE — Progress Notes (Signed)
°   04/30/21 1100  Psych Admission Type (Psych Patients Only)  Admission Status Voluntary  Psychosocial Assessment  Patient Complaints Anxiety  Eye Contact Fair  Facial Expression Sad  Affect Anxious;Sad;Preoccupied  Speech Logical/coherent;Rapid  Interaction Assertive;Attention-seeking  Motor Activity Other (Comment)  Appearance/Hygiene Unremarkable  Behavior Characteristics Cooperative  Mood Anxious  Thought Process  Coherency WDL  Content Blaming others;Blaming self  Delusions Paranoid  Perception Hallucinations  Hallucination None reported or observed  Judgment Poor  Confusion None  Danger to Self  Current suicidal ideation? Denies  Self-Injurious Behavior No self-injurious ideation or behavior indicators observed or expressed   Agreement Not to Harm Self Yes  Description of Agreement verbal  Danger to Others  Danger to Others None reported or observed

## 2021-04-30 NOTE — [facility] Group Notes (Signed)
Pt  attended and contributed to group discussion. °

## 2021-04-30 NOTE — Progress Notes (Signed)
[facility] MD Progress Note  04/30/2021 5:05 PM [name]  MRN:  [id number]  Subjective: [name] reports, "I'm doing well. I guess I have to focus on getting my car now. I understand that the social worker is not going to help me much. [facility] has declined me because I did not have any health insurance. I packed my car at my fried's apt parking lot. They want it moved or it will be towed. I'm good with the withdrawal symptoms, they stopped already.  I'm back on my medicines. I feel like I'm ready to be discharged. Physically I'm doing well. But mentally, I'm never going to be okay. I'm ready to be discharged". Daily notes 04-30-21: [name] is seen, chart reviewed. The chart findings discussed with the treatment team. She presents alert, oriented & aware of situation. She is visible on the unit, attending group sessions. She says she is doing well physically, but mentally, she is never going to be okay. She says she is currently as good as she can get. She says she is back on her medications, doing well on those. Denies any side effects. She wants to be discharged to go get her car at her friend's apt parking lot before it is towed. She denies any SIHI, AVH, delusional thoughts or paranoia. She does not appear to be responding to any internal stimuli.  Reason for admission: 46 year old Caucasian female with hx of mental health issues & stimulant use disorder. [name] is well known in this [facility] from her previous admission for mood stabilization. She is admitted to the [facility] this time around from the [facility] with complain of worsening suicidal ideations & auditory/visual hallucinations. After the a psychiatric evaluation at [facility], she was sent to the [facility] for further psychiatric evaluation & treatments. Her UDS was positive for methamphetamine & Benzodiazepine.  Principal Problem: MDD (major depressive disorder), recurrent episode, severe (HCC)  Diagnosis: Principal Problem:   MDD (major depressive disorder), recurrent  episode, severe (HCC) Active Problems:   PTSD (post-traumatic stress disorder)   Stimulant use disorder  Total Time spent with patient:  35 minutes  Past Psychiatric History: Major depressive disorder, Stimulant use disorder.  Past Medical History:  Past Medical History:  Diagnosis Date   Anxiety    Arthritis    Asthma    last flareup was yr or so with bronchitis   Bipolar disorder (HCC)    Carpal tunnel syndrome    COPD (chronic obstructive pulmonary disease) (HCC)    Depressed    Dysrhythmia    'skipped beats every now and then"   Emphysema    does smoke, and has slowed down   Fibromyalgia    Fibromyalgia, primary    GERD (gastroesophageal reflux disease)    periodic indigestion   Hepatitis C    Hypertension    dx a couple of months ago   Kidney stone    Manic depressive disorder (HCC)    Nephrolithiasis    PTSD (post-traumatic stress disorder)    UTI (lower urinary tract infection)     Past Surgical History:  Procedure Laterality Date   ABDOMINAL HYSTERECTOMY     ANTERIOR CERVICAL DECOMP/DISCECTOMY FUSION N/A 04/22/2015   Procedure: Cervical 4-5, Cervical 5-6 Anterior Cervical Discectomy and Fusion, Allograft, Plate;  Surgeon: Eldred Manges, MD;  Location: [facility] OR;  Service: Orthopedics;  Laterality: N/A;   CARPAL TUNNEL RELEASE     right hand   ORIF ANKLE FRACTURE  02/03/2012   Procedure: OPEN REDUCTION INTERNAL  FIXATION (ORIF) ANKLE FRACTURE;  Surgeon: Nadara Mustard, MD;  Location: [facility] OR;  Service: Orthopedics;  Laterality: Right;  Open Reduction Internal Fixation Right Ankle   ORIF CLAVICULAR FRACTURE Left 08/29/2012   Dr Ophelia Charter   ORIF CLAVICULAR FRACTURE Left 08/29/2012   Procedure: OPEN REDUCTION INTERNAL FIXATION (ORIF) CLAVICULAR FRACTURE;  Surgeon: Eldred Manges, MD;  Location: [facility] OR;  Service: Orthopedics;  Laterality: Left;  Open Reduction Internal Fixation Left Clavicle Fracture   SPLENECTOMY, 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  Social History:  Social History   Substance and Sexual Activity  Alcohol Use Yes   Comment: occ mixed drink or liquor     Social History   Substance and Sexual Activity  Drug Use Not Currently   Types: Marijuana   Comment: last marijuana use 05/27/2019    Social History   Socioeconomic History   Marital status: Single    Spouse name: Not on file   Number of children: 4   Years of education: 8th   Highest education level: Not on file  Occupational History    Employer: [employer]    Comment: [employer]  Tobacco Use   Smoking status: Every Day    Packs/day: 1.00    Years: 28.00    Pack years: 28.00    Types: Cigarettes    Start date: 03/28/1990   Smokeless tobacco: Never  Vaping Use   Vaping Use: Never used  Substance and Sexual Activity   Alcohol use: Yes    Comment: occ mixed drink or liquor   Drug use: Not Currently    Types: Marijuana    Comment: last marijuana use 05/27/2019   Sexual activity: Not on file  Other Topics Concern   Not on file  Social History Narrative   ** Merged History Encounter **       As of 02/20/12:  Completed some high school. Has a fiance Conley Rolls) who brings her to appointments. Unemployed. No regular exercise. Feels safe in her relationships  Has smoked cigarettes since she was 46 years old, currently doing 1/2 to 1 pack    every two days. Denies recreational drug use currently or a history of ever using IV drugs. Occasionally drinks a couple drinks about twice per week.  There was a time where she drank 2-3 drinks either every day or every other day for about three years du   ring 2009-2012.  Note: after reviewing prior records, patient has had multiple admissions to behavioral health, several of which mention alcohol abuse, and at least one of which was for alcohol detoxification. Patient did not share this information duri   ng our visit.  Has four children (ages 11, 52, 6, 44). The 64 year old lives with pt's sister in [place] so that they can continue to go to school at the same place. The 45 year old recently  moved in with her two months ago, but had previously    been living with pt's sister as well (moved in as there was conflict between child & pt's sister). The other two kids are adults and live on their own.  Caffeine Use: 1-2 cups daily   Social Determinants of Health   Financial Resource Strain: Not on file  Food Insecurity: Not on file  Transportation Needs: Not on file  Physical Activity: Not on file  Stress: Not on file  Social Connections: Not on file   Additional Social History:   Sleep: Good  Appetite:  Good  Current Medications: Current Facility-Administered Medications  Medication Dose Route Frequency Provider Last Rate Last Admin   acetaminophen (TYLENOL) tablet 650 mg  650 mg Oral Q6H PRN Estella HuskLaubach, Katherine S, MD   650 mg at 04/28/21 1810   alum & mag hydroxide-simeth (MAALOX/MYLANTA) 200-200-20 MG/5ML suspension 30 mL  30 mL Oral Q4H PRN Estella HuskLaubach, Katherine S, MD       escitalopram (LEXAPRO) tablet 20 mg  20 mg Oral Daily Massengill, Harrold DonathNathan, MD   20 mg at 04/30/21 0831   gabapentin (NEURONTIN) capsule 300 mg  300 mg Oral TID Estella HuskLaubach, Katherine S, MD   300 mg at 04/30/21 1154   hydrOXYzine (ATARAX) tablet 25 mg  25 mg Oral TID PRN Estella HuskLaubach, Katherine S, MD   25 mg at 04/30/21 1155   lamoTRIgine (LAMICTAL) tablet 25 mg  25 mg Oral Daily Massengill, Harrold DonathNathan, MD   25 mg at 04/30/21 0831   magnesium hydroxide (MILK OF  MAGNESIA) suspension 30 mL  30 mL Oral Daily PRN Estella HuskLaubach, Katherine S, MD       OLANZapine (ZYPREXA) tablet 10 mg  10 mg Oral QHS Massengill, Harrold DonathNathan, MD   10 mg at 04/29/21 2108   traZODone (DESYREL) tablet 50 mg  50 mg Oral QHS PRN Estella HuskLaubach, Katherine S, MD   50 mg at 04/29/21 2108    Lab Results:  Results for orders placed or performed during the hospital encounter of 04/28/21 (from the past 48 hour(s))  CBC     Status: Abnormal   Collection Time: 04/30/21  6:19 AM  Result Value Ref Range   WBC 8.4 4.0 - 10.5 K/uL   RBC 4.09 3.87 - 5.11 MIL/uL   Hemoglobin 12.8 12.0 - 15.0 g/dL   HCT 78.239.2 95.636.0 - 21.346.0 %   MCV 95.8 80.0 - 100.0 fL   MCH 31.3 26.0 - 34.0 pg   MCHC 32.7 30.0 - 36.0 g/dL   RDW 08.612.9 57.811.5 - 46.915.5 %   Platelets 519 (H) 150 - 400 K/uL   nRBC 0.0 0.0 - 0.2 %    Comment: Performed at [facility], [address] [place], [place] [address]  Comprehensive metabolic panel     Status: Abnormal   Collection Time: 04/30/21  6:19 AM  Result Value Ref Range   Sodium 136 135 - 145 mmol/L   Potassium 4.3 3.5 - 5.1 mmol/L   Chloride 104 98 - 111 mmol/L   CO2 26 22 - 32 mmol/L   Glucose, Bld 87 70 - 99 mg/dL    Comment: Glucose reference range applies only to samples taken after fasting for at least 8 hours.   BUN 12 6 - 20 mg/dL   Creatinine, Ser 8.410.61 0.44 - 1.00 mg/dL   Calcium 8.9 8.9 - 32.410.3 mg/dL   Total Protein 6.6 6.5 - 8.1 g/dL   Albumin 3.4 (L) 3.5 - 5.0 g/dL  AST 36 15 - 41 U/L   ALT 35 0 - 44 U/L   Alkaline Phosphatase 74 38 - 126 U/L   Total Bilirubin 0.1 (L) 0.3 - 1.2 mg/dL   GFR, Estimated >36>60 >64>60 mL/min    Comment: (NOTE) Calculated using the CKD-EPI Creatinine Equation (2021)    Anion gap 6 5 - 15    Comment: Performed at [facility], [address] [place], [place] [address]   Blood Alcohol level:  Lab Results  Component Value Date   ETH <10 04/22/2021   ETH <10 04/21/2021   Metabolic Disorder Labs: Lab Results   Component Value Date   HGBA1C 5.1 04/22/2021   MPG 99.67 04/22/2021   MPG 105.41 11/10/2017   No results found for: PROLACTIN Lab Results  Component Value Date   CHOL 140 04/22/2021   TRIG 56 04/22/2021   HDL 45 04/22/2021   CHOLHDL 3.1 04/22/2021   VLDL 11 04/22/2021   LDLCALC 84 04/22/2021   LDLCALC 64 11/10/2017   Physical Findings: AIMS: Facial and Oral Movements Muscles of Facial Expression: None, normal Lips and Perioral Area: None, normal Jaw: None, normal Tongue: None, normal,Extremity Movements Upper (arms, wrists, hands, fingers): None, normal Lower (legs, knees, ankles, toes): None, normal, Trunk Movements Neck, shoulders, hips: None, normal, Overall Severity Severity of abnormal movements (highest score from questions above): None, normal Incapacitation due to abnormal movements: None, normal Patient's awareness of abnormal movements (rate only patient's report): No Awareness, Dental Status Current problems with teeth and/or dentures?: No Does patient usually wear dentures?: No  CIWA:    COWS:     Musculoskeletal: Strength & Muscle Tone: within normal limits Gait & Station: normal Patient leans: N/A  Psychiatric Specialty Exam:  Presentation  General Appearance: Casual; Fairly Groomed; Appropriate for Environment  Eye Contact:Fair  Speech:Clear and Coherent; Normal Rate  Speech Volume:Increased  Handedness:Right   Mood and Affect  Mood:Anxious; Depressed; Dysphoric; Hopeless; Worthless  Affect:Congruent; Tearful; Labile   Thought Process  Thought Processes:Disorganized  Descriptions of Associations:Tangential  Orientation:Full (Time, Place and Person)  Thought Content:Rumination  History of Schizophrenia/Schizoaffective disorder:Yes  Duration of Psychotic Symptoms:Greater than six months  Hallucinations:Hallucinations: None Description of Auditory Hallucinations: NA  Ideas of Reference:None  Suicidal Thoughts:Suicidal Thoughts:  No SI Active Intent and/or Plan: Without Intent; Without Plan; Without Means to Carry Out; Without Access to Means SI Passive Intent and/or Plan: Without Intent; Without Plan; Without Means to Carry Out; Without Access to Means  Homicidal Thoughts:Homicidal Thoughts: No  Sensorium  Memory:Recent Good; Immediate Good; Remote Fair  Judgment:Fair  Insight:Fair  Executive Functions  Concentration:Fair  Attention Span:Fair  Recall:Fair  Fund of Knowledge:Fair  Language:Fair  Psychomotor Activity  Psychomotor Activity:Psychomotor Activity: Increased; Restlessness  Assets  Assets:Communication Skills; Desire for Improvement; Resilience  Sleep  Sleep:Sleep: Good Number of Hours of Sleep: 6.75  Physical Exam: Physical Exam Vitals and nursing note reviewed.  HENT:     Head: Normocephalic.     Nose: Nose normal.     Mouth/Throat:     Pharynx: Oropharynx is clear.  Eyes:     Pupils: Pupils are equal, round, and reactive to light.  Cardiovascular:     Rate and Rhythm: Normal rate.     Pulses: Normal pulses.  Pulmonary:     Effort: Pulmonary effort is normal.  Genitourinary:    Comments: Deferred Musculoskeletal:        General: Normal range of motion.     Cervical back: Normal range of  motion.  Skin:    General: Skin is warm and dry.  Neurological:     General: No focal deficit present.     Mental Status: She is alert and oriented to person, place, and time.   Review of Systems  Constitutional:  Negative for chills, diaphoresis and fever.  HENT:  Negative for congestion and sore throat.   Eyes:  Negative for blurred vision.  Respiratory:  Negative for cough, shortness of breath and wheezing.   Cardiovascular:  Negative for chest pain and palpitations.  Gastrointestinal:  Negative for abdominal pain, constipation, diarrhea, heartburn, nausea and vomiting.  Genitourinary:  Negative for dysuria.  Musculoskeletal:  Negative for joint pain and myalgias.  Skin:   Negative for itching and rash.  Neurological:  Negative for dizziness, tingling, tremors, sensory change, speech change, focal weakness, seizures, loss of consciousness, weakness and headaches.  Psychiatric/Behavioral:  Positive for depression ("Improving") and substance abuse. Negative for hallucinations, memory loss and suicidal ideas. The patient is not nervous/anxious and does not have insomnia.   Blood pressure 113/81, pulse 89, temperature 98.1 F (36.7 C), temperature source Oral, resp. rate 16, height 5\' 6"  (1.676 m), weight 59.1 kg, SpO2 97 %. Body mass index is 21.01 kg/m.  Treatment Plan Summary: Daily contact with patient to assess and evaluate symptoms and progress in treatment and Medication management.   Continue inpatient hospitalization.  Will continue today 04/30/2021 plan as below except where it is noted.    Treatment Plan Summary: Daily contact with patient to assess and evaluate symptoms and progress in treatment and Medication management.    Treatment Plan/Recommendations: 1. Admit for crisis management and stabilization, estimated length of stay 3-5 days.    Diagnoses. Major depressive disorder, recurrent episodes with psychosis.  R/o Schizoaffective disorder, bipolar-type. Generalized anxiety disorder. R/o bipolar 1 disorder hypomanic episodes. Stimulant use disorder.   2. Medication management to reduce current symptoms to base line and improve the patient's overall level of functioning: See [facility] for plan of care.  -Continue Lexapro 20 mg po daily for depression.  -gabapentin 300 mg po tid daily for agitation.  -Hydroxyzine 25 mg po tid prn for anxiety.  -Lamictal 25 mg po daily for mood stabilization. -Olanzapine 10 mg po Q bedtime for mood control. -Continue Trazodone 50 mg po Q bedtime prn for insomnia.    Other prn medications.  -Continue acetaminophen 650 mg po Q 6 hrs prn for fever/pain.  -Continue Mylanta 30 ml po Q 4 hrs prn for  indigestion. -Continue MOM 30 ml po Q daily prn for indigestion.   -Encourage group attendance & participation.  -Discharge participation disposition plan in progress.  [facility], NP, pmhnp, fnp-bc. 04/30/2021, 5:05 PM

## 2021-04-30 NOTE — BH IP Treatment Plan (Signed)
Interdisciplinary Treatment and Diagnostic Plan Update  04/30/2021 LYNELLE WEILER MRN: [id number]  Principal Diagnosis: MDD (major depressive disorder), recurrent episode, severe (HCC)  Secondary Diagnoses: Principal Problem:   MDD (major depressive disorder), recurrent episode, severe (HCC) Active Problems:   PTSD (post-traumatic stress disorder)   Stimulant use disorder   Current Medications:  Current Facility-Administered Medications  Medication Dose Route Frequency Provider Last Rate Last Admin   acetaminophen (TYLENOL) tablet 650 mg  650 mg Oral Q6H PRN Estella Husk, MD   650 mg at 04/28/21 1810   alum & mag hydroxide-simeth (MAALOX/MYLANTA) 200-200-20 MG/5ML suspension 30 mL  30 mL Oral Q4H PRN Estella Husk, MD       escitalopram (LEXAPRO) tablet 20 mg  20 mg Oral Daily Massengill, Nathan, MD   20 mg at 04/30/21 0831   gabapentin (NEURONTIN) capsule 300 mg  300 mg Oral TID Estella Husk, MD   300 mg at 04/30/21 1154   hydrOXYzine (ATARAX) tablet 25 mg  25 mg Oral TID PRN Estella Husk, MD   25 mg at 04/30/21 1155   lamoTRIgine (LAMICTAL) tablet 25 mg  25 mg Oral Daily Massengill, Harrold Donath, MD   25 mg at 04/30/21 0831   magnesium hydroxide (MILK OF MAGNESIA) suspension 30 mL  30 mL Oral Daily PRN Estella Husk, MD       OLANZapine (ZYPREXA) tablet 10 mg  10 mg Oral QHS Massengill, Harrold Donath, MD   10 mg at 04/29/21 2108   traZODone (DESYREL) tablet 50 mg  50 mg Oral QHS PRN Estella Husk, MD   50 mg at 04/29/21 2108   PTA Medications: Medications Prior to Admission  Medication Sig Dispense Refill Last Dose   escitalopram (LEXAPRO) 20 MG tablet Take 1 tablet (20 mg total) by mouth daily. 30 tablet 1    gabapentin (NEURONTIN) 300 MG capsule Take 1 capsule (300 mg total) by mouth 3 (three) times daily. 90 capsule 1    hydrOXYzine (ATARAX) 25 MG tablet Take 1 tablet (25 mg total) by mouth 3 (three) times daily as needed for anxiety. 30 tablet 1     OLANZapine (ZYPREXA) 5 MG tablet Take 1 tablet (5 mg total) by mouth at bedtime. 30 tablet 1     Patient Stressors: Financial difficulties   Health problems   Occupational concerns   Substance abuse    Patient Strengths: Motivation for treatment/growth   Treatment Modalities: Medication Management, Group therapy, Case management,  1 to 1 session with clinician, Psychoeducation, Recreational therapy.   Physician Treatment Plan for Primary Diagnosis: MDD (major depressive disorder), recurrent episode, severe (HCC) Long Term Goal(s): Improvement in symptoms so as ready for discharge   Short Term Goals: Ability to identify and develop effective coping behaviors will improve Ability to maintain clinical measurements within normal limits will improve Ability to identify triggers associated with substance abuse/mental health issues will improve Ability to identify changes in lifestyle to reduce recurrence of condition will improve Ability to verbalize feelings will improve Ability to disclose and discuss suicidal ideas Ability to demonstrate self-control will improve  Medication Management: Evaluate patient's response, side effects, and tolerance of medication regimen.  Therapeutic Interventions: 1 to 1 sessions, Unit Group sessions and Medication administration.  Evaluation of Outcomes: Progressing  Physician Treatment Plan for Secondary Diagnosis: Principal Problem:   MDD (major depressive disorder), recurrent episode, severe (HCC) Active Problems:   PTSD (post-traumatic stress disorder)   Stimulant use disorder  Long Term Goal(s): Improvement in  symptoms so as ready for discharge   Short Term Goals: Ability to identify and develop effective coping behaviors will improve Ability to maintain clinical measurements within normal limits will improve Ability to identify triggers associated with substance abuse/mental health issues will improve Ability to identify changes in  lifestyle to reduce recurrence of condition will improve Ability to verbalize feelings will improve Ability to disclose and discuss suicidal ideas Ability to demonstrate self-control will improve     Medication Management: Evaluate patient's response, side effects, and tolerance of medication regimen.  Therapeutic Interventions: 1 to 1 sessions, Unit Group sessions and Medication administration.  Evaluation of Outcomes: Progressing   RN Treatment Plan for Primary Diagnosis: MDD (major depressive disorder), recurrent episode, severe (HCC) Long Term Goal(s): Knowledge of disease and therapeutic regimen to maintain health will improve  Short Term Goals: Ability to demonstrate self-control, Ability to participate in decision making will improve, and Ability to verbalize feelings will improve  Medication Management: RN will administer medications as ordered by provider, will assess and evaluate patient's response and provide education to patient for prescribed medication. RN will report any adverse and/or side effects to prescribing provider.  Therapeutic Interventions: 1 on 1 counseling sessions, Psychoeducation, Medication administration, Evaluate responses to treatment, Monitor vital signs and CBGs as ordered, Perform/monitor CIWA, COWS, AIMS and Fall Risk screenings as ordered, Perform wound care treatments as ordered.  Evaluation of Outcomes: Progressing   LCSW Treatment Plan for Primary Diagnosis: MDD (major depressive disorder), recurrent episode, severe (HCC) Long Term Goal(s): Safe transition to appropriate next level of care at discharge, Engage patient in therapeutic group addressing interpersonal concerns.  Short Term Goals: Engage patient in aftercare planning with referrals and resources, Increase social support, and Increase ability to appropriately verbalize feelings  Therapeutic Interventions: Assess for all discharge needs, 1 to 1 time with Social worker, Explore available  resources and support systems, Assess for adequacy in community support network, Educate family and significant other(s) on suicide prevention, Complete Psychosocial Assessment, Interpersonal group therapy.  Evaluation of Outcomes: Progressing   Progress in Treatment: Attending groups: Yes. Participating in groups: Yes. Taking medication as prescribed: Yes. Toleration medication: Yes. Family/Significant other contact made: Yes, individual(s) contacted:  friend Patient understands diagnosis: Yes. Discussing patient identified problems/goals with staff: Yes. Medical problems stabilized or resolved: Yes. Denies suicidal/homicidal ideation: Yes. Issues/concerns per patient self-inventory: No. Other: None  New problem(s) identified: No, Describe:  None  New Short Term/Long Term Goal(s):medication stabilization, elimination of SI thoughts, development of comprehensive mental wellness plan.   Patient Goals:  "housing and substance use treatment"  Discharge Plan or Barriers: Patient recently admitted. CSW will continue to follow and assess for appropriate referrals and possible discharge planning.   Reason for Continuation of Hospitalization: Medication stabilization Suicidal ideation Withdrawal symptoms  Estimated Length of Stay: 3-5 days   Scribe for Treatment Team: Chrys Racer 04/30/2021 12:10 PM

## 2021-04-30 NOTE — Progress Notes (Addendum)
Patient did attend the first part of the evening speaker AA meeting but left shortly after it began.

## 2021-04-30 NOTE — BHH Counselor (Signed)
CSW attempted to contact patient lawyer, Virgina Evener, to discuss providing a letter to [facility] for admission into their program regarding continued court dates.  CSW had to leave a message for a call back.    Adarian Bur, LCSW, LCAS Clincal Social Worker  [facility]

## 2021-04-30 NOTE — BHH Counselor (Signed)
CSW provided patient homeless packet with crisis resources, shelter resources, [employer] resources and food pantry resources.    Faryal Marxen, LCSW, LCAS Clincal Social Worker  [facility]

## 2021-04-30 NOTE — Group Note (Signed)
LCSW Group Therapy Note   Group Date: 04/30/2021 Start Time: 1300 End Time: 1400   Type of Therapy and Topic:  Group Therapy: Boundaries  Participation Level:  Active  Description of Group: This group will address the use of boundaries in their personal lives. Patients will explore why boundaries are important, the difference between healthy and unhealthy boundaries, and negative and postive outcomes of different boundaries and will look at how boundaries can be crossed.  Patients will be encouraged to identify current boundaries in their own lives and identify what kind of boundary is being set. Facilitators will guide patients in utilizing problem-solving interventions to address and correct types boundaries being used and to address when no boundary is being used. Understanding and applying boundaries will be explored and addressed for obtaining and maintaining a balanced life. Patients will be encouraged to explore ways to assertively make their boundaries and needs known to significant others in their lives, using other group members and facilitator for role play, support, and feedback.  Therapeutic Goals:  1.  Patient will identify areas in their life where setting clear boundaries could be  used to improve their life.  2.  Patient will identify signs/triggers that a boundary is not being respected. 3.  Patient will identify two ways to set boundaries in order to achieve balance in  their lives: 4.  Patient will demonstrate ability to communicate their needs and set boundaries  through discussion and/or role plays  Summary of Patient Progress:  Pt was present/active throughout the session and proved open to feedback from CSW and peers. Patient demonstrated insight into the subject matter, was respectful of peers, and was present throughout the entire session.  Therapeutic Modalities:   Cognitive Behavioral Therapy Solution-Focused Therapy  Chrys Racer 04/30/2021  1:27 PM

## 2021-05-01 DIAGNOSIS — F332 Major depressive disorder, recurrent severe without psychotic features: Secondary | ICD-10-CM

## 2021-05-01 MED ORDER — OLANZAPINE 10 MG PO TABS: 10.0000 mg | ORAL_TABLET | Freq: Every day | ORAL | 0 refills | Status: AC

## 2021-05-01 MED ORDER — GABAPENTIN 300 MG PO CAPS: 300.0000 mg | ORAL_CAPSULE | Freq: Three times a day (TID) | ORAL | 0 refills | Status: AC

## 2021-05-01 MED ORDER — ESCITALOPRAM OXALATE 20 MG PO TABS: 20.0000 mg | ORAL_TABLET | Freq: Every day | ORAL | 0 refills | Status: AC

## 2021-05-01 MED ORDER — LAMOTRIGINE 25 MG PO TABS: 25.0000 mg | ORAL_TABLET | Freq: Every day | ORAL | 0 refills | Status: AC

## 2021-05-01 MED ORDER — TRAZODONE HCL 50 MG PO TABS: 50.0000 mg | ORAL_TABLET | Freq: Every evening | ORAL | 0 refills | Status: AC | PRN

## 2021-05-01 MED ORDER — HYDROXYZINE HCL 25 MG PO TABS: 25.0000 mg | ORAL_TABLET | Freq: Three times a day (TID) | ORAL | 0 refills | Status: AC | PRN

## 2021-05-01 NOTE — Group Note (Signed)
LCSW Group Therapy Note  No therapy group could be held today due to other needs on the unit that were prioritized.  The patient did not go without group, as another licensed group was held by an Therapist, sports.  Selmer Dominion, LCSW 01/16/2021 10:10 AM

## 2021-05-01 NOTE — Plan of Care (Signed)
  Problem: Education: Goal: Emotional status will improve Outcome: Progressing Goal: Mental status will improve Outcome: Progressing   Problem: Activity: Goal: Sleeping patterns will improve Outcome: Progressing   

## 2021-05-01 NOTE — Progress Notes (Signed)
°  [facility] Adult Case Management Discharge Plan :  Will you be returning to the same living situation after discharge:  Yes,  living in her car At discharge, do you have transportation home?: Yes,  given bus passes Do you have the ability to pay for your medications: No.  Needs help from community agency  Release of information consent forms completed and emailed to Medical Records, then turned in to Medical Records by CSW.   Patient to Follow up at:  [facility] Follow up.   Specialty: Behavioral Health Why: You may also go to this provider for therapy and medication management services during walk in hours:  Monday through Wednesday, from 7:45 am to 11:00 am.  Services are provided on a first come, first served basis. Contact information: [place] Follow up.   Specialty: Addiction Medicine Why: Referral made.  Please feel free to call them to follow up if you decide to pursue inpatient rehabilitation. Contact information: [id number] [place] [address] [phone]         [facility] Follow up.   Why: Referral made.  Please feel free to call them to follow up if you decide to pursue inpatient rehabilitation. Contact information: [place] [address] [phone]         [facility]. Go to.   Why: Go to walk-in clinic and let them know you have just been discharged from the hospital. Contact information: [address] [place] [address] [phone]                 Next level of care provider has access to [place] and Suicide Prevention discussed: Yes,  with friend     Has patient been referred to the Quitline?: Patient refused referral  Patient has been referred for addiction treatment:  Yes  Maretta Los, LCSW 05/01/2021, 2:55 PM

## 2021-05-01 NOTE — Progress Notes (Addendum)
°  On assessment, pt endorses moderate anxiety.  Pt is talkative and restless.  Pt attempts to attend the AA meeting but reports she was not able to sit still any longer.  Pt denies SI/HI/ AVH.  Pt verbally contracts for safety.  Administered PRN Hydroxyzine for anxiety, PRN Trazodone for sleep, and PRN Tylenol for neck pain 6/10 per [facility] per pt request.  Pt remains safe on unit with Q 15 minute safety checks.    04/30/21 2113  Psych Admission Type (Psych Patients Only)  Admission Status Voluntary  Psychosocial Assessment  Patient Complaints Anxiety  Eye Contact Fair  Facial Expression Animated;Anxious  Affect Anxious;Preoccupied  Speech Logical/coherent;Rapid  Interaction Assertive;Attention-seeking  Motor Activity Other (Comment) (WDL)  Appearance/Hygiene Unremarkable  Behavior Characteristics Cooperative;Anxious;Restless  Mood Anxious  Thought Process  Coherency WDL  Content Blaming others;Blaming self  Delusions Paranoid  Perception Hallucinations  Hallucination None reported or observed  Judgment Poor  Confusion None  Danger to Self  Current suicidal ideation? Denies  Self-Injurious Behavior No self-injurious ideation or behavior indicators observed or expressed   Agreement Not to Harm Self Yes  Description of Agreement verbal contract for safety  Danger to Others  Danger to Others None reported or observed

## 2021-05-01 NOTE — Discharge Summary (Signed)
Physician Discharge Summary Note  Patient:  [name] is an 46 y.o., female MRN:  [id number] DOB:  [birth year] Patient phone:  [phone] (home)  Patient address:   [place] [address],  Total Time spent with patient: 30 minutes  Date of Admission:  04/28/2021 Date of Discharge: 05/01/2021  Reason for Admission: [name]  is a 46 year old Caucasian female with hx of mental health issues & stimulant use disorder. [name] is well known in this [facility] from her previous admission for mood stabilization. She was admitted to the [facility] this time around from the [facility] with complaints of worsening suicidal ideations & auditory/visual hallucinations.   Principal Problem: MDD (major depressive disorder), recurrent episode, severe (HCC) Discharge Diagnoses: Principal Problem:   MDD (major depressive disorder), recurrent episode, severe (HCC) Active Problems:   PTSD (post-traumatic stress disorder)   Stimulant use disorder  Past Psychiatric History: As above  Past Medical History:  Past Medical History:  Diagnosis Date   Anxiety    Arthritis    Asthma    last flareup was yr or so with bronchitis   Bipolar disorder (HCC)    Carpal tunnel syndrome    COPD (chronic obstructive pulmonary disease) (HCC)    Depressed    Dysrhythmia    'skipped beats every now and then"   Emphysema    does smoke, and has slowed down   Fibromyalgia    Fibromyalgia, primary    GERD (gastroesophageal reflux disease)    periodic indigestion   Hepatitis C    Hypertension    dx a couple of months ago   Kidney stone    Manic depressive disorder (HCC)    Nephrolithiasis    PTSD (post-traumatic stress disorder)    UTI (lower urinary tract infection)     Past Surgical History:  Procedure Laterality Date   ABDOMINAL HYSTERECTOMY     ANTERIOR CERVICAL DECOMP/DISCECTOMY FUSION N/A 04/22/2015   Procedure: Cervical 4-5, Cervical 5-6 Anterior Cervical Discectomy and Fusion, Allograft, Plate;  Surgeon:  Eldred Manges, MD;  Location: [facility] OR;  Service: Orthopedics;  Laterality: N/A;   CARPAL TUNNEL RELEASE     right hand   ORIF ANKLE FRACTURE  02/03/2012   Procedure: OPEN REDUCTION INTERNAL FIXATION (ORIF) ANKLE FRACTURE;  Surgeon: Nadara Mustard, MD;  Location: [facility] OR;  Service: Orthopedics;  Laterality: Right;  Open Reduction Internal Fixation Right Ankle   ORIF CLAVICULAR FRACTURE Left 08/29/2012   Dr Ophelia Charter   ORIF CLAVICULAR FRACTURE Left 08/29/2012   Procedure: OPEN REDUCTION INTERNAL FIXATION (ORIF) CLAVICULAR FRACTURE;  Surgeon: Eldred Manges, MD;  Location: [facility] OR;  Service: Orthopedics;  Laterality: Left;  Open Reduction Internal Fixation Left Clavicle Fracture   SPLENECTOMY, 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   Procedure: Splenectomy;  Surgeon: Kinsinger, De Blanch, MD;  Location: [facility] OR;  Service: General;;   TUBAL LIGATION     WISDOM TOOTH EXTRACTION     Family History:  Family History  Problem Relation Age of Onset   Alcohol abuse Father    Cirrhosis Father    Cancer Father    Heart attack Mother    Hypertension Mother    Hyperthyroidism Sister    Bipolar disorder Sister    Bipolar disorder Sister    Kidney disease Brother    Bipolar disorder Brother    Family Psychiatric  History: non provided Social History:  Social History   Substance and Sexual Activity  Alcohol Use Yes   Comment: occ mixed drink  or liquor     Social History   Substance and Sexual Activity  Drug Use Not Currently   Types: Marijuana   Comment: last marijuana use 05/27/2019    Social History   Socioeconomic History   Marital status: Single    Spouse name: Not on file   Number of children: 4   Years of education: 8th   Highest education level: Not on file  Occupational History    Employer: [employer]    Comment: [employer]  Tobacco Use   Smoking status: Every Day    Packs/day: 1.00    Years: 28.00    Pack years: 28.00    Types: Cigarettes    Start date: 03/28/1990   Smokeless tobacco: Never   Vaping Use   Vaping Use: Never used  Substance and Sexual Activity   Alcohol use: Yes    Comment: occ mixed drink or liquor   Drug use: Not Currently    Types: Marijuana    Comment: last marijuana use 05/27/2019   Sexual activity: Not on file  Other Topics Concern   Not on file  Social History Narrative   ** Merged History Encounter **       As of 02/20/12:  Completed some high school. Has a fiance [name]) who brings her to appointments. Unemployed. No regular exercise. Feels safe in her relationships  Has smoked cigarettes since she was 46 years old, currently doing 1/2 to 1 pack    every two days. Denies recreational drug use currently or a history of ever using IV drugs. Occasionally drinks a couple drinks about twice per week. There was a time where she drank 2-3 drinks either every day or every other day for about three years du   ring 2009-2012.  Note: after reviewing prior records, patient has had multiple admissions to behavioral health, several of which mention alcohol abuse, and at least one of which was for alcohol detoxification. Patient did not share this information duri   ng our visit.  Has four children (ages 66, 61, 71, 66). The 58 year old lives with pt's sister in [place] so that they can continue to go to school at the same place. The 45 year old recently moved in with her two months ago, but had previously    been living with pt's sister as well (moved in as there was conflict between child & pt's sister). The other two kids are adults and live on their own.  Caffeine Use: 1-2 cups daily   Social Determinants of Health   Financial Resource Strain: Not on file  Food Insecurity: Not on file  Transportation Needs: Not on file  Physical Activity: Not on file  Stress: Not on file  Social Connections: Not on file   Hospital Course:  During the patient's hospitalization, patient had extensive initial psychiatric evaluation, and follow-up psychiatric  evaluations every day.  Psychiatric diagnoses provided upon initial assessment were as follows:  Major depressive disorder, recurrent episodes with psychosis.  R/o Schizoaffective disorder, bipolar-type. Generalized anxiety disorder. R/o bipolar 1 disorder hypomanic episodes. Stimulant use disorder.   Patient's psychiatric medications were adjusted on admission as follows:  - Lexapro 20 mg po daily for depression.  -gabapentin 300 mg po tid daily for agitation.  -Hydroxyzine 25 mg po tid prn for anxiety.  -Lamictal 25 mg po daily for mood stabilization. -Olanzapine 10 mg po Q bedtime for mood control. -Trazodone 50 mg po Q bedtime prn for insomnia.  During the hospitalization, no other adjustments were made to the patient's psychiatric medication regimen. Patient's care was discussed during the interdisciplinary team meeting every day during the hospitalization. The patient denies having side effects to prescribed psychiatric medication. The patient was evaluated each day by a clinical provider to ascertain response to treatment. Improvement was noted by the patient's report of decreasing symptoms, improved sleep and appetite, affect, medication tolerance, behavior, and participation in unit programming.  Patient was asked each day to complete a self inventory noting mood, mental status, pain, new symptoms, anxiety and concerns.   Symptoms were reported as significantly decreased or resolved completely by discharge.  The patient reports that their mood is stable.  The patient denies having suicidal thoughts for more than 48 hours prior to discharge.  Patient denies having homicidal thoughts.  Patient denies having auditory hallucinations.  Patient denies any visual hallucinations or other symptoms of psychosis.  The patient is motivated to continue taking medication with a goal of continued improvement in mental health.    The patient reports that their target psychiatric symptoms of  depression, and auditory/visual hallucinations responded well to the psychiatric medications, and the patient reports overall benefit from this psychiatric hospitalization. Supportive psychotherapy was provided to the patient. The patient also participated in regular group therapy while hospitalized. Coping skills, problem solving as well as relaxation therapies were also part of the unit programming.   Labs were reviewed with the patient, and abnormal results were discussed with the patient, including elevated platelets of 519 K/ul. Pt is agreeable to following this up on an outpatient basis with her Primary care provider.   Physical Findings: AIMS: Facial and Oral Movements Muscles of Facial Expression: None, normal Lips and Perioral Area: None, normal Jaw: None, normal Tongue: None, normal,Extremity Movements Upper (arms, wrists, hands, fingers): None, normal Lower (legs, knees, ankles, toes): None, normal, Trunk Movements Neck, shoulders, hips: None, normal, Overall Severity Severity of abnormal movements (highest score from questions above): None, normal Incapacitation due to abnormal movements: None, normal Patient's awareness of abnormal movements (rate only patient's report): No Awareness, Dental Status Current problems with teeth and/or dentures?: No Does patient usually wear dentures?: No  CIWA:  n/a COWS:  n/a  Musculoskeletal: Strength & Muscle Tone: within normal limits Gait & Station: normal Patient leans: N/A  Psychiatric Specialty Exam:  Presentation  General Appearance: Appropriate for Environment; Casual; Fairly Groomed  Eye Contact:Fair  Speech:Clear and Coherent  Speech Volume:Normal  Handedness:Right  Mood and Affect  Mood:Euthymic  Affect:Congruent  Thought Process  Thought Processes:Coherent  Descriptions of Associations:Intact  Orientation:Full (Time, Place and Person)  Thought Content:Logical  History of Schizophrenia/Schizoaffective  disorder:No  Duration of Psychotic Symptoms:N/A  Hallucinations:Hallucinations: None  Ideas of Reference:None  Suicidal Thoughts:Suicidal Thoughts: No  Homicidal Thoughts:Homicidal Thoughts: No  Sensorium  Memory:Immediate Good; Recent Good  Judgment:Fair  Insight:Fair  Executive Functions  Concentration:Fair  Attention Span:Good  Recall:Good  Fund of Knowledge:Good  Language:Good  Psychomotor Activity  Psychomotor Activity:Psychomotor Activity: Normal  Assets  Assets:Communication Skills  Sleep  Sleep:Sleep: Good  Physical Exam: Physical Exam HENT:     Head: Normocephalic.     Nose: No congestion or rhinorrhea.  Eyes:     General:        Right eye: No discharge.        Left eye: No discharge.  Cardiovascular:     Heart sounds: No murmur heard. Pulmonary:     Effort: Pulmonary effort is normal. No respiratory distress.  Musculoskeletal:  General: No swelling. Normal range of motion.     Cervical back: Normal range of motion. No rigidity.  Skin:    Coloration: Skin is not pale.  Neurological:     Mental Status: She is alert and oriented to person, place, and time.     Sensory: No sensory deficit.     Coordination: Coordination normal.  Psychiatric:        Behavior: Behavior normal.        Thought Content: Thought content normal.   Review of Systems  Constitutional: Negative.  Negative for fever.  HENT: Negative.    Eyes: Negative.   Respiratory: Negative.  Negative for cough and shortness of breath.   Cardiovascular: Negative.  Negative for chest pain.  Gastrointestinal: Negative.  Negative for heartburn and nausea.  Genitourinary: Negative.   Musculoskeletal: Negative.   Skin: Negative.   Neurological: Negative.  Negative for dizziness, tingling and headaches.  Psychiatric/Behavioral:  Positive for depression (improving with medications) and substance abuse. Negative for hallucinations and suicidal ideas. The patient is not  nervous/anxious and does not have insomnia.   Blood pressure 107/70, pulse 80, temperature 97.9 F (36.6 C), temperature source Oral, resp. rate 18, height 5\' 6"  (1.676 m), weight 59.1 kg, SpO2 99 %. Body mass index is 21.01 kg/m.  Social History   Tobacco Use  Smoking Status Every Day   Packs/day: 1.00   Years: 28.00   Pack years: 28.00   Types: Cigarettes   Start date: 03/28/1990  Smokeless Tobacco Never   Tobacco Cessation:  N/A, patient does not currently use tobacco products  Blood Alcohol level:  Lab Results  Component Value Date   ETH <10 04/22/2021   ETH <10 04/21/2021    Metabolic Disorder Labs:  Lab Results  Component Value Date   HGBA1C 5.1 04/22/2021   MPG 99.67 04/22/2021   MPG 105.41 11/10/2017   No results found for: PROLACTIN Lab Results  Component Value Date   CHOL 140 04/22/2021   TRIG 56 04/22/2021   HDL 45 04/22/2021   CHOLHDL 3.1 04/22/2021   VLDL 11 04/22/2021   LDLCALC 84 04/22/2021   LDLCALC 64 11/10/2017    See Psychiatric Specialty Exam and Suicide Risk Assessment completed by Attending Physician prior to discharge.  Discharge destination:  Home. Pt is currently homeless, states that she considers her car her home. She has been educated about shelters including the fact that social work is willing to look for shelter accommodations for her. Pt reports not being interested in shelters at this time and has been provided with housing resources.  Is patient on multiple antipsychotic therapies at discharge:  No   Has Patient had three or more failed trials of antipsychotic monotherapy by history:  No  Recommended Plan for Multiple Antipsychotic Therapies: NA Discharge Instructions     [facility] consult for medication samples   Complete by: As directed    Pharmacist, please give patient a 7 day supply of her medications. She has no insurance. Thank you.      Allergies as of 05/01/2021       Reactions   Sulfa Antibiotics Anaphylaxis,  Rash   Eggs Or Egg-derived Products Hives   Milk-related Compounds Hives        Medication List     TAKE these medications      Indication  escitalopram 20 MG tablet Commonly known as: LEXAPRO Take 1 tablet (20 mg total) by mouth daily.  Indication: Major Depressive Disorder   gabapentin  300 MG capsule Commonly known as: NEURONTIN Take 1 capsule (300 mg total) by mouth 3 (three) times daily.  Indication: Social Anxiety Disorder   hydrOXYzine 25 MG tablet Commonly known as: ATARAX Take 1 tablet (25 mg total) by mouth 3 (three) times daily as needed for anxiety.  Indication: Feeling Anxious   lamoTRIgine 25 MG tablet Commonly known as: LAMICTAL Take 1 tablet (25 mg total) by mouth daily. Start taking on: May 02, 2021  Indication: Manic-Depression   OLANZapine 10 MG tablet Commonly known as: ZYPREXA Take 1 tablet (10 mg total) by mouth at bedtime. What changed:  medication strength how much to take  Indication: Major Depressive Disorder   traZODone 50 MG tablet Commonly known as: DESYREL Take 1 tablet (50 mg total) by mouth at bedtime as needed for sleep.  Indication: Trouble Sleeping        Follow-up Information     [facility] Follow [facility].   Specialty: Behavioral Health Why: You may also go to this provider for therapy and medication management services during walk in hours:  Monday through Wednesday, from 7:45 am to 11:00 am.  Services are provided on a first come, first served basis. Contact information: [address] [place] [id number] [phone]        [facility] Follow up.   Specialty: Addiction Medicine Why: Referral made.  Please feel free to call them to follow up if you decide to pursue inpatient rehabilitation. Contact information: [address] [place] [address] [phone]         [facility] Follow up.    Why: Referral made.  Please feel free to call them to follow up if you decide to pursue inpatient rehabilitation. Contact information: [address] [place] [address] [phone]         [facility]. Go to.   Why: Go to walk-in clinic and let them know you have just been discharged from the hospital. Contact information: [address]  [address] [place] [address] [phone]                Follow-up recommendations:   The patient is able to verbalize their individual safety plan to this provider.   # It is recommended to the patient to continue psychiatric medications as prescribed, after discharge from the hospital.     # It is recommended to the patient to follow up with your outpatient psychiatric provider and PCP.   # It was discussed with the patient, the impact of alcohol, drugs, tobacco have been there overall psychiatric and medical wellbeing, and total abstinence from substance use was recommended the patient.ed.   # Prescriptions provided or sent directly to preferred pharmacy at discharge. Patient agreeable to plan. Given opportunity to ask questions. Appears to feel comfortable with discharge.    # In the event of worsening symptoms, the patient is instructed to call the crisis hotline (988), 911 and or go to the nearest ED for appropriate evaluation and treatment of symptoms. To follow-up with primary care provider for other medical issues, concerns and or health care needs   # Patient was discharged with a plan to follow up as noted above.   Signed: Starleen Blueoris  Najee Manninen, NP 05/01/2021, 1:49 PM

## 2021-05-01 NOTE — BHH Suicide Risk Assessment (Signed)
Suicide Risk Assessment  Discharge Assessment    [facility] Discharge Suicide Risk Assessment   Principal Problem: MDD (major depressive disorder), recurrent episode, severe ([place]) Discharge Diagnoses: Principal Problem:   MDD (major depressive disorder), recurrent episode, severe ([place]) Active Problems:   PTSD (post-traumatic stress disorder)   Stimulant use disorder  HPI: [name]  is a 46 year old Caucasian female with hx of mental health issues & stimulant use disorder. [name] is well known in this [facility] from her previous admission for mood stabilization. She was admitted to the [facility] this time around from the [facility] with complaints of worsening suicidal ideations & auditory/visual hallucinations.   HOSPITAL COURSE: During the patient's hospitalization, patient had extensive initial psychiatric evaluation, and follow-up psychiatric evaluations every day.  Psychiatric diagnoses provided upon initial assessment were as follows:  Major depressive disorder, recurrent episodes with psychosis.  R/o Schizoaffective disorder, bipolar-type. Generalized anxiety disorder. R/o bipolar 1 disorder hypomanic episodes. Stimulant use disorder.  Patient's psychiatric medications were adjusted on admission as follows:  - Lexapro 20 mg po daily for depression.  -gabapentin 300 mg po tid daily for agitation.  -Hydroxyzine 25 mg po tid prn for anxiety.  -Lamictal 25 mg po daily for mood stabilization. -Olanzapine 10 mg po Q bedtime for mood control. -Trazodone 50 mg po Q bedtime prn for insomnia.   During the hospitalization, no other adjustments were made to the patient's psychiatric medication regimen. Patient's care was discussed during the interdisciplinary team meeting every day during the hospitalization. The patient denies having side effects to prescribed psychiatric medication. The patient was evaluated each day by a clinical provider to ascertain response to treatment. Improvement was noted by the  patient's report of decreasing symptoms, improved sleep and appetite, affect, medication tolerance, behavior, and participation in unit programming.  Patient was asked each day to complete a self inventory noting mood, mental status, pain, new symptoms, anxiety and concerns.   Symptoms were reported as significantly decreased or resolved completely by discharge.  The patient reports that their mood is stable.  The patient denies having suicidal thoughts for more than 48 hours prior to discharge.  Patient denies having homicidal thoughts.  Patient denies having auditory hallucinations.  Patient denies any visual hallucinations or other symptoms of psychosis.  The patient is motivated to continue taking medication with a goal of continued improvement in mental health.   The patient reports that their target psychiatric symptoms of depression, and auditory/visual hallucinations responded well to the psychiatric medications, and the patient reports overall benefit from this psychiatric hospitalization. Supportive psychotherapy was provided to the patient. The patient also participated in regular group therapy while hospitalized. Coping skills, problem solving as well as relaxation therapies were also part of the unit programming.  Labs were reviewed with the patient, and abnormal results were discussed with the patient, including elevated platelets of 519 K/ul. Pt is agreeable to following this up on an outpatient basis with her Primary care provider.  Total Time spent with patient: 1 hour  Musculoskeletal: Strength & Muscle Tone: within normal limits Gait & Station: normal Patient leans: N/A  Psychiatric Specialty Exam  Presentation  General Appearance: Appropriate for Environment; Casual; Fairly Groomed  Eye Contact:Fair  Speech:Clear and Coherent  Speech Volume:Normal  Handedness:Right   Mood and Affect  Mood:Euthymic  Duration of Depression Symptoms: Greater than two  weeks  Affect:Congruent   Thought Process  Thought Processes:Coherent  Descriptions of Associations:Intact  Orientation:Full (Time, Place and Person)  Thought Content:Logical  History of Schizophrenia/Schizoaffective disorder:No  Duration of Psychotic Symptoms:N/A  Hallucinations:Hallucinations: None  Ideas of Reference:None  Suicidal Thoughts:Suicidal Thoughts: No  Homicidal Thoughts:Homicidal Thoughts: No   Sensorium  Memory:Immediate Good; Recent Good  Judgment:Fair  Insight:Fair  Executive Functions  Concentration:Fair  Attention Span:Good  [place] of Knowledge:Good  Language:Good  Psychomotor Activity  Psychomotor Activity:Psychomotor Activity: Normal  Assets  Assets:Communication Skills  Sleep  Sleep:Sleep: Good  Physical Exam: Physical Exam HENT:     Head: Normocephalic.     Right Ear: There is no impacted cerumen.     Left Ear: There is no impacted cerumen.     Nose: No congestion or rhinorrhea.     Mouth/Throat:     Pharynx: No oropharyngeal exudate or posterior oropharyngeal erythema.  Eyes:     General:        Right eye: No discharge.        Left eye: No discharge.  Cardiovascular:     Rate and Rhythm: Normal rate.  Pulmonary:     Effort: No respiratory distress.  Abdominal:     General: There is no distension.  Musculoskeletal:        General: No swelling. Normal range of motion.     Cervical back: Normal range of motion. No rigidity.  Neurological:     Mental Status: She is alert and oriented to person, place, and time.     Sensory: No sensory deficit.     Motor: No weakness.  Psychiatric:        Behavior: Behavior normal.        Thought Content: Thought content normal.   Review of Systems  Constitutional: Negative.  Negative for fever.  HENT: Negative.    Eyes: Negative.   Respiratory: Negative.  Negative for cough.   Cardiovascular: Negative.  Negative for chest pain.  Gastrointestinal: Negative.   Negative for abdominal pain, heartburn, nausea and vomiting.  Genitourinary: Negative.   Musculoskeletal: Negative.  Negative for myalgias.  Skin: Negative.   Neurological: Negative.  Negative for dizziness and headaches.  Psychiatric/Behavioral:  Positive for depression (improving with medications) and substance abuse. Negative for hallucinations and suicidal ideas. The patient is not nervous/anxious and does not have insomnia.   Blood pressure 107/70, pulse 80, temperature 97.9 F (36.6 C), temperature source Oral, resp. rate 18, height 5\' 6"  (1.676 m), weight 59.1 kg, SpO2 99 %. Body mass index is 21.01 kg/m.  Mental Status Per Nursing Assessment::   On Admission:  Suicidal ideation indicated by patient  Demographic Factors:  Caucasian and Low socioeconomic status  Loss Factors: NA  Historical Factors: NA  Risk Reduction Factors:   NA  Continued Clinical Symptoms:  Pt states that her depressive symptoms are improving with her current medication regimen.  Cognitive Features That Contribute To Risk:  None    Suicide Risk:  Minimal: No identifiable suicidal ideation.  Patients presenting with no risk factors but with morbid ruminations; may be classified as minimal risk based on the severity of the depressive symptoms   Follow-up [facility] Follow up.   Specialty: Behavioral Health Why: You may also go to this provider for therapy and medication management services during walk in hours:  Monday through Wednesday, from 7:45 am to 11:00 am.  Services are provided on a first come, first served basis. Contact information: [place] Follow  up.   Specialty: Addiction Medicine Why: Referral made.  Please feel free to call them to follow up if you decide to pursue inpatient rehabilitation. Contact information: [id number] [place] [address] [phone]         [facility] Follow up.   Why: Referral made.  Please feel free to call them to follow up if you decide to pursue inpatient rehabilitation. Contact information: [place] [address] [phone]         [facility]. Go to.   Why: Go to walk-in clinic and let them know you have just been discharged from the hospital. Contact information: [address]  [place] [address] [phone]                Plan Of Care/Follow-up recommendations:   The patient is able to verbalize their individual safety plan to this provider.  # It is recommended to the patient to continue psychiatric medications as prescribed, after discharge from the hospital.    # It is recommended to the patient to follow up with your outpatient psychiatric provider and PCP.  # It was discussed with the patient, the impact of alcohol, drugs, tobacco have been there overall psychiatric and medical wellbeing, and total abstinence from substance use was recommended the patient.ed.  # Prescriptions provided or sent directly to preferred pharmacy at discharge. Patient agreeable to plan. Given opportunity to ask questions. Appears to feel comfortable with discharge.    # In the event of worsening symptoms, the patient is instructed to call the crisis hotline (988), 911 and or go to the nearest ED for appropriate evaluation and treatment of symptoms. To follow-up with primary care provider for other medical issues, concerns and or health care needs  # Patient was discharged with a plan to follow up as noted above.   Nicholes Rough, NP 05/01/2021, 1:09 PM

## 2021-05-01 NOTE — BHH Group Notes (Incomplete)
Goals Group °05/01/2021 ° ° °Group Focus: affirmation, clarity of thought, and goals/reality orientation °Treatment Modality:  Psychoeducation °Interventions utilized were assignment, group exercise, and support °Purpose: To be able to understand and verbalize the reason for their admission to the hospital. To understand that the medication helps with their chemical imbalance but they also need to work on their choices in life. To be challenged to develop a list of 30 positives about themselves. Also introduce the concept that "feelings" are not reality. ° °Participation Level:  Active ° °Participation Quality:  Appropriate ° °Affect:  Appropriate ° °Cognitive:  Appropriate ° °Insight:  Improving ° °Engagement in Group:  Engaged ° °Additional Comments:  ... ° °[name] A °

## 2021-05-01 NOTE — Progress Notes (Signed)
Pt discharged to lobby. Pt was stable and appreciative at that time. All papers, samples and prescriptions were given and valuables returned. Verbal understanding expressed. Denies SI/HI and A/VH. Pt given opportunity to express concerns and ask questions.  

## 2021-05-01 NOTE — Progress Notes (Signed)
Patient did not attend morning orientation group group.

## 2021-05-11 ENCOUNTER — Telehealth (HOSPITAL_COMMUNITY): Payer: Self-pay

## 2021-05-11 NOTE — BH Assessment (Signed)
Care Management - Follow Up [facility] Discharges   Patient has been placed in an inpatient psychiatric hospital [facility]) on 04-28-2021.

## 2021-06-30 ENCOUNTER — Emergency Department (HOSPITAL_COMMUNITY)
Admission: EM | Admit: 2021-06-30 | Discharge: 2021-06-30 | Disposition: A | Discharge status: Home / Self Care | Payer: Self-pay | Attending: Emergency Medicine | Admitting: Emergency Medicine

## 2021-06-30 ENCOUNTER — Emergency Department (HOSPITAL_COMMUNITY): Payer: Self-pay

## 2021-06-30 ENCOUNTER — Other Ambulatory Visit: Payer: Self-pay

## 2021-06-30 DIAGNOSIS — R1084 Generalized abdominal pain: Secondary | ICD-10-CM | POA: Insufficient documentation

## 2021-06-30 DIAGNOSIS — R197 Diarrhea, unspecified: Secondary | ICD-10-CM | POA: Insufficient documentation

## 2021-06-30 LAB — CBC
HCT: 38.5 % (ref 36.0–46.0)
Hemoglobin: 12.7 g/dL (ref 12.0–15.0)
MCH: 30.7 pg (ref 26.0–34.0)
MCHC: 33 g/dL (ref 30.0–36.0)
MCV: 93 fL (ref 80.0–100.0)
Platelets: 442 10*3/uL — ABNORMAL HIGH (ref 150–400)
RBC: 4.14 MIL/uL (ref 3.87–5.11)
RDW: 15.2 % (ref 11.5–15.5)
WBC: 11.5 10*3/uL — ABNORMAL HIGH (ref 4.0–10.5)
nRBC: 0 % (ref 0.0–0.2)

## 2021-06-30 LAB — COMPREHENSIVE METABOLIC PANEL
ALT: 93 U/L — ABNORMAL HIGH (ref 0–44)
AST: 75 U/L — ABNORMAL HIGH (ref 15–41)
Albumin: 3.8 g/dL (ref 3.5–5.0)
Alkaline Phosphatase: 91 U/L (ref 38–126)
Anion gap: 6 (ref 5–15)
BUN: 18 mg/dL (ref 6–20)
CO2: 27 mmol/L (ref 22–32)
Calcium: 9 mg/dL (ref 8.9–10.3)
Chloride: 108 mmol/L (ref 98–111)
Creatinine, Ser: 0.83 mg/dL (ref 0.44–1.00)
GFR, Estimated: >60 mL/min (ref >60)
Glucose, Bld: 80 mg/dL (ref 70–99)
Potassium: 4.7 mmol/L (ref 3.5–5.1)
Sodium: 141 mmol/L (ref 135–145)
Total Bilirubin: 0.5 mg/dL (ref 0.3–1.2)
Total Protein: 7.1 g/dL (ref 6.5–8.1)

## 2021-06-30 LAB — URINALYSIS, ROUTINE W REFLEX MICROSCOPIC
Bilirubin Urine: NEGATIVE
Glucose, UA: NEGATIVE mg/dL
Hgb urine dipstick: NEGATIVE
Ketones, ur: NEGATIVE mg/dL
Leukocytes,Ua: NEGATIVE
Nitrite: NEGATIVE
Protein, ur: NEGATIVE mg/dL
Specific Gravity, Urine: 1.014 (ref 1.005–1.030)
pH: 5 (ref 5.0–8.0)

## 2021-06-30 LAB — LIPASE, BLOOD: Lipase: 42 U/L (ref 11–51)

## 2021-06-30 MED ORDER — ONDANSETRON 4 MG PO TBDP
4.0000 mg | ORAL_TABLET | Freq: Once | ORAL | Status: AC
  Administered 2021-06-30: 4 mg via ORAL
  Filled 2021-06-30: qty 1

## 2021-06-30 MED ORDER — FAMOTIDINE 20 MG PO TABS
20.0000 mg | ORAL_TABLET | Freq: Once | ORAL | Status: AC
  Administered 2021-06-30: 20 mg via ORAL
  Filled 2021-06-30: qty 1

## 2021-06-30 MED ORDER — OXYCODONE-ACETAMINOPHEN 5-325 MG PO TABS
1.0000 | ORAL_TABLET | Freq: Once | ORAL | Status: AC
  Administered 2021-06-30: 1 via ORAL
  Filled 2021-06-30: qty 1

## 2021-06-30 MED ORDER — ONDANSETRON 4 MG PO TBDP: 4.0000 mg | ORAL_TABLET | Freq: Three times a day (TID) | ORAL | 0 refills | Status: AC | PRN

## 2021-06-30 NOTE — Discharge Instructions (Addendum)
I have printed a copy of your CT scan.  I have also given you the information for [facility] you may follow-up with them to discuss surgery ? ? ?

## 2021-06-30 NOTE — ED Triage Notes (Signed)
Pt. Stated, I started having pain in the area were my hernia is, I started having pain and swelling after doing sit ups and helping moe furniture . Im not able to hold my bowels hardly. Its like my hernia ruptured cause irts soft not hard. ?

## 2021-06-30 NOTE — ED Notes (Signed)
Patient verbalizes understanding of discharge instructions. Opportunity for questioning and answers were provided. Armband removed by staff, pt discharged from ED and ambulated to lobby to return home.   

## 2021-06-30 NOTE — ED Provider Notes (Signed)
?[facility] EMERGENCY DEPARTMENT ?Provider Note ? ? ?CSN: [id number] ?Arrival date & time: 06/30/21  4431 ? ?  ? ?History ? ?Chief Complaint  ?Patient presents with  ? Abdominal Pain  ? Diarrhea  ? ? ?[name] is a 46 y.o. female. ? ? ?Abdominal Pain ?Associated symptoms: diarrhea   ?Diarrhea ?Associated symptoms: abdominal pain   ?Patient is a 46 year old female with past medical history notable for incisional hernia presented emergency room today with complaints of right abdominal pain that she feels is in the area of her hernia.  She states she is eating and drinking normally and pooping more frequently with some loose stool but denies any blood in her stool.  Denies any chest pain or difficulty breathing.  No other associate symptoms.  Does not take any medicine for pain.  She states that her pain is been for approximately 3 to 4 days has been constant but unchanging.  She states that her symptoms came on after she did some crunches/sit ups as an abdominal exercise.  Denies any urinary frequency urgency hematuria dysuria ?  ? ?Home Medications ?Prior to Admission medications   ?Medication Sig Start Date End Date Taking? Authorizing Provider  ?escitalopram (LEXAPRO) 20 MG tablet Take 1 tablet (20 mg total) by mouth daily. 05/01/21  Yes Starleen Blue, NP  ?gabapentin (NEURONTIN) 300 MG capsule Take 1 capsule (300 mg total) by mouth 3 (three) times daily. 05/01/21 06/30/21 Yes Starleen Blue, NP  ?hydrOXYzine (ATARAX) 25 MG tablet Take 1 tablet (25 mg total) by mouth 3 (three) times daily as needed for anxiety. 05/01/21  Yes Starleen Blue, NP  ?lamoTRIgine (LAMICTAL) 25 MG tablet Take 1 tablet (25 mg total) by mouth daily. 05/02/21  Yes Starleen Blue, NP  ?Multiple Vitamin (MULTIVITAMIN PO) Take 1 tablet by mouth daily.   Yes [provider]  ?OLANZapine (ZYPREXA) 10 MG tablet Take 1 tablet (10 mg total) by mouth at bedtime. 05/01/21  Yes Starleen Blue, NP  ?ondansetron (ZOFRAN-ODT) 4 MG  disintegrating tablet Take 1 tablet (4 mg total) by mouth every 8 (eight) hours as needed for nausea or vomiting. 06/30/21  Yes Gailen Shelter, PA  ?traZODone (DESYREL) 50 MG tablet Take 1 tablet (50 mg total) by mouth at bedtime as needed for sleep. 05/01/21  Yes Starleen Blue, NP  ?   ? ?Allergies    ?Sulfa antibiotics, Eggs or egg-derived products, and Milk-related compounds   ? ?Review of Systems   ?Review of Systems  ?Gastrointestinal:  Positive for abdominal pain and diarrhea.  ? ?Physical Exam ?Updated Vital Signs ?BP 127/73   Pulse 72   Temp 97.8 ?F (36.6 ?C) (Oral)   Resp 20   Ht 5\' 6"  (1.676 m)   Wt 61.2 kg   SpO2 96%   BMI 21.79 kg/m?  ?Physical Exam ?Vitals and nursing note reviewed.  ?Constitutional:   ?   General: She is not in acute distress. ?HENT:  ?   Head: Normocephalic and atraumatic.  ?   Nose: Nose normal.  ?Eyes:  ?   General: No scleral icterus. ?Cardiovascular:  ?   Rate and Rhythm: Normal rate and regular rhythm.  ?   Pulses: Normal pulses.  ?   Heart sounds: Normal heart sounds.  ?Pulmonary:  ?   Effort: Pulmonary effort is normal. No respiratory distress.  ?   Breath sounds: No wheezing.  ?Abdominal:  ?   Palpations: Abdomen is soft.  ?   Tenderness: There is  abdominal tenderness.  ?   Comments: Right periumbilical tenderness  ?Musculoskeletal:  ?   Cervical back: Normal range of motion.  ?   Right lower leg: No edema.  ?   Left lower leg: No edema.  ?Skin: ?   General: Skin is warm and dry.  ?   Capillary Refill: Capillary refill takes less than 2 seconds.  ?Neurological:  ?   Mental Status: She is alert. Mental status is at baseline.  ?Psychiatric:     ?   Mood and Affect: Mood normal.     ?   Behavior: Behavior normal.  ? ? ?ED Results / Procedures / Treatments   ?Labs ?(all labs ordered are listed, but only abnormal results are displayed) ?Labs Reviewed  ?COMPREHENSIVE METABOLIC PANEL - Abnormal; Notable for the following components:  ?    Result Value  ? AST 75 (*)   ? ALT 93  (*)   ? All other components within normal limits  ?CBC - Abnormal; Notable for the following components:  ? WBC 11.5 (*)   ? Platelets 442 (*)   ? All other components within normal limits  ?URINALYSIS, ROUTINE W REFLEX MICROSCOPIC - Abnormal; Notable for the following components:  ? APPearance HAZY (*)   ? All other components within normal limits  ?LIPASE, BLOOD  ? ? ?EKG ?None ? ?Radiology ?CT ABDOMEN PELVIS WO CONTRAST ? ?Result Date: 06/30/2021 ?CLINICAL DATA:  Severe abdominal pain and tenderness to palpation. EXAM: CT ABDOMEN AND PELVIS WITHOUT CONTRAST TECHNIQUE: Multidetector CT imaging of the abdomen and pelvis was performed following the standard protocol without IV contrast. RADIATION DOSE REDUCTION: This exam was performed according to the departmental dose-optimization program which includes automated exposure control, adjustment of the mA and/or kV according to patient size and/or use of iterative reconstruction technique. COMPARISON:  07/08/2020 FINDINGS: Lower chest: No acute findings. Hepatobiliary: No mass visualized on this unenhanced exam. Gallbladder is unremarkable. No evidence of biliary ductal dilatation. Pancreas: No mass or inflammatory process visualized on this unenhanced exam. 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 Adrenals/Urinary tract: No evidence of urolithiasis or hydronephrosis. Unremarkable unopacified urinary bladder. Stomach/Bowel: No evidence of obstruction, inflammatory process, or abnormal fluid collections. Normal appendix visualized. Diverticulosis is seen mainly involving the sigmoid colon, however there is no evidence of diverticulitis. Vascular/Lymphatic: No pathologically enlarged lymph nodes identified. No evidence of abdominal aortic aneurysm. Reproductive: Prior hysterectomy noted. Adnexal regions are unremarkable in appearance. Other: Stable small paraumbilical hernia which contains only omental fat.  Musculoskeletal:  No suspicious bone lesions identified. IMPRESSION: No acute findings. Colonic diverticulosis, without radiographic evidence of diverticulitis. Stable small paraumbilical hernia which contains only omental fat. Electronically Signed   By: Danae OrleansJohn A Stahl M.D.   On: 06/30/2021 12:30   ? ?Procedures ?Procedures  ? ? ?Medications Ordered in ED ?Medications  ?oxyCODONE-acetaminophen (PERCOCET/ROXICET) 5-325 MG per tablet 1 tablet (1 tablet Oral Given 06/30/21 1141)  ?ondansetron (ZOFRAN-ODT) disintegrating tablet 4 mg (4 mg Oral Given 06/30/21 1142)  ?famotidine (PEPCID) tablet 20 mg (20 mg Oral Given 06/30/21 1141)  ? ? ?ED Course/ Medical Decision Making/ A&P ?  ?                        ?Medical Decision Making ?Amount and/or Complexity of Data Reviewed ?Labs: ordered. ?Radiology: ordered. ? ?Risk ?Prescription drug management. ? ? ?Patient is a 46 year old female with past medical history notable for incisional hernia presented emergency room  today with complaints of right abdominal pain that she feels is in the area of her hernia.  She states she is eating and drinking normally and pooping more frequently with some loose stool but denies any blood in her stool.  Denies any chest pain or difficulty breathing.  No other associate symptoms.  Does not take any medicine for pain.  She states that her pain is been for approximately 3 to 4 days has been constant but unchanging.  She states that her symptoms came on after she did some crunches/sit ups as an abdominal exercise.  Denies any urinary frequency urgency hematuria dysuria ? ?Patient overall well-appearing on examination does have some right periumbilical tenderness.  No guarding or rebound.  I overall recommended against CT however she would like the study done. ? ?Low suspicion for obstruction.  Will obtain Noncon CT abdomen pelvis. ? ?I personally viewed images from CT abdomen pelvis without contrast agree with radiology read no acute  abnormalities ? ?IMPRESSION: No acute findings. Colonic diverticulosis, without radiographic evidence of diverticulitis. Stable small paraumbilical hernia which contains only omental fat. Electronically Signed   By: Danae Orleans M.D.   On: 04/
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
## 2022-03-09 IMAGING — CT CT ABD-PELV W/ CM
2 of 5 series · 16 of 46 positions shown, 18 images · IV contrast (Omnipaque or Isovue)
Comparison: 06/27/2014

CLINICAL DATA: Abdominal distension. Status post splenectomy 1
month ago. Midline pain at the laparotomy scar. Nausea.

EXAM:
CT ABDOMEN AND PELVIS WITH CONTRAST
TECHNIQUE: Multidetector CT imaging of the abdomen and pelvis was performed
using the standard protocol following bolus administration of
intravenous contrast.
CONTRAST:  80mL OMNIPAQUE IOHEXOL 300 MG/ML  SOLN

[Series 2: axial st · axial · 0.68mm/px · z∈[+786,+1180]mm · 13 of 91 slices shown, 15 images]
[im 6/91  soft-tissue]
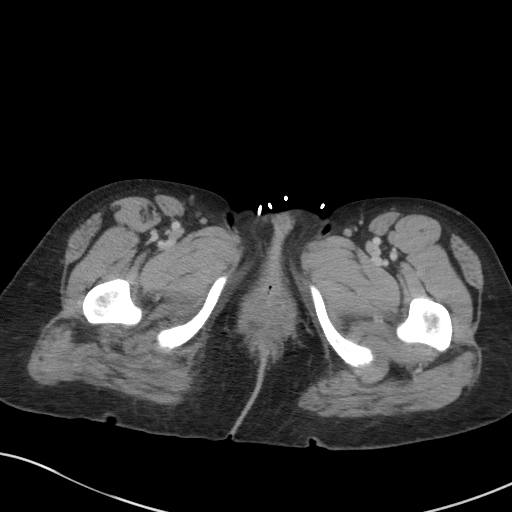
[im 6/91  bone]
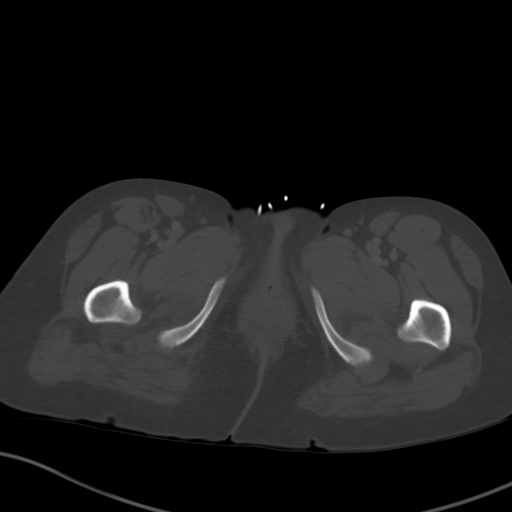
[im 12/91  soft-tissue]
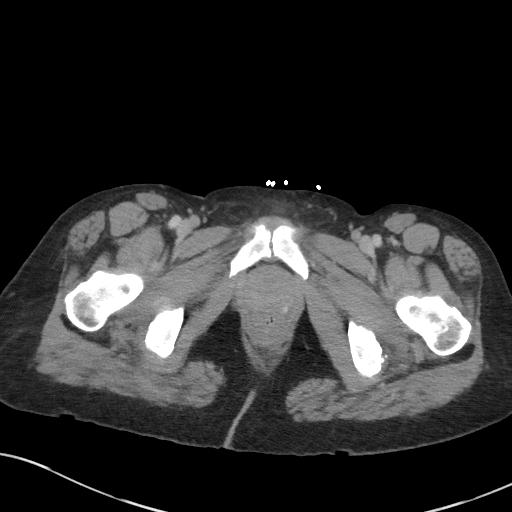
[im 17/91  soft-tissue]
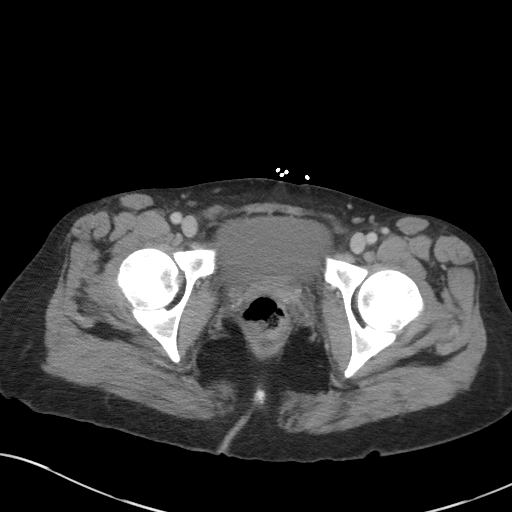
[im 29/91  soft-tissue]
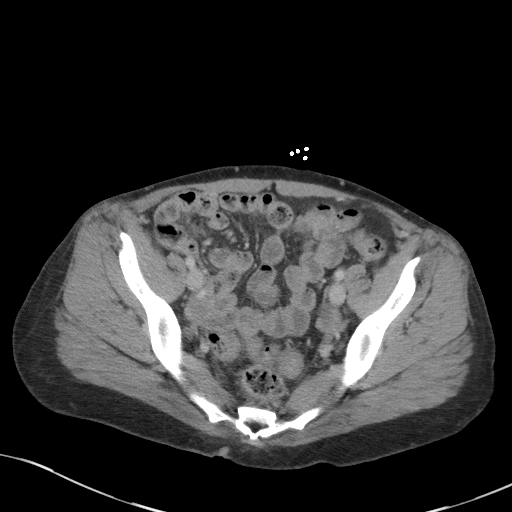
[im 34/91  soft-tissue]
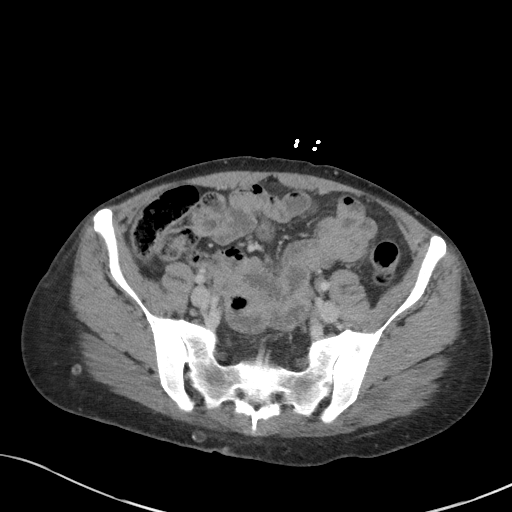
[im 40/91  soft-tissue]
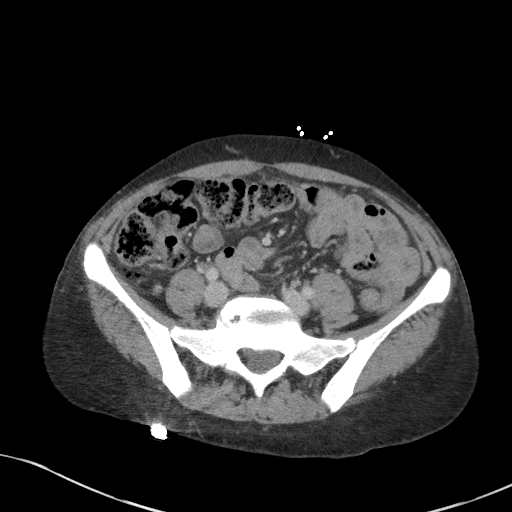
[im 46/91  soft-tissue]
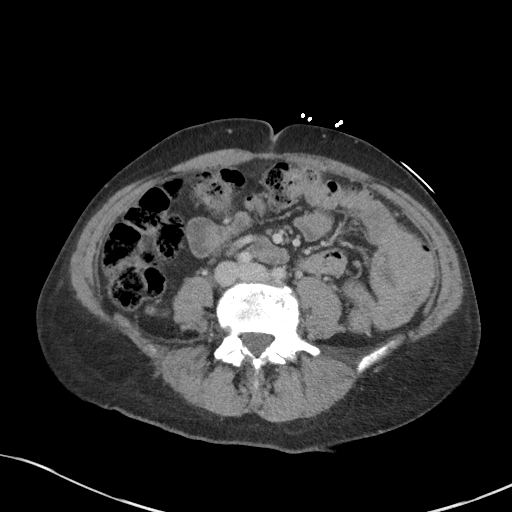
[im 51/91  soft-tissue]
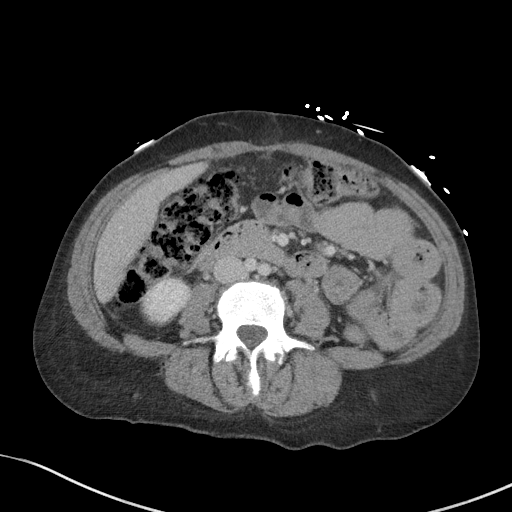
[im 57/91  soft-tissue]
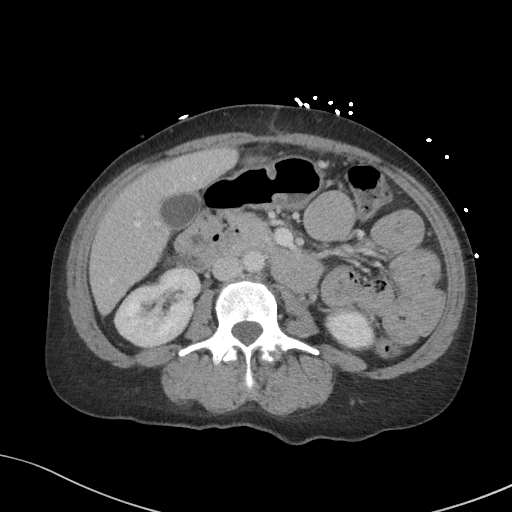
[im 57/91  bone]
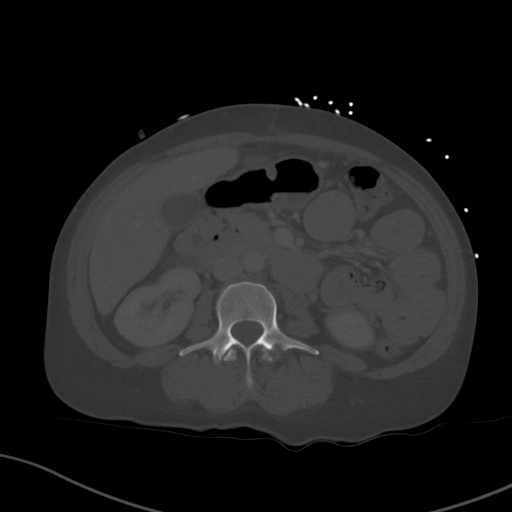
[im 62/91  soft-tissue]
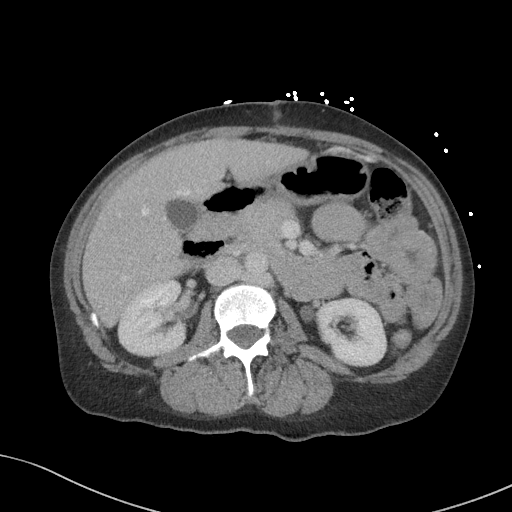
[im 74/91  soft-tissue]
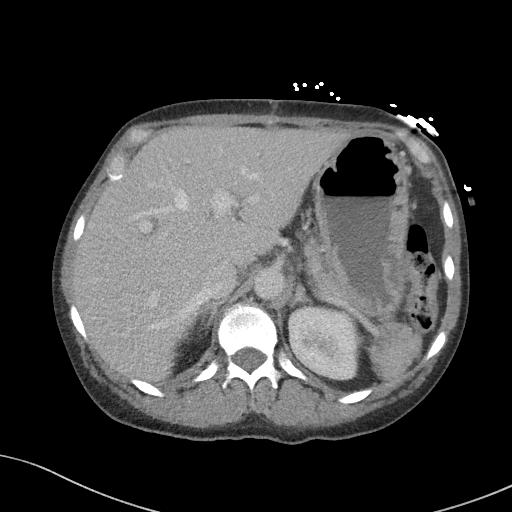
[im 79/91  soft-tissue]
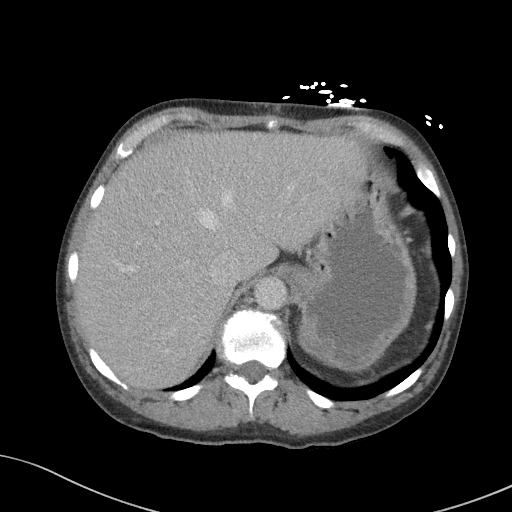
[im 85/91  soft-tissue]
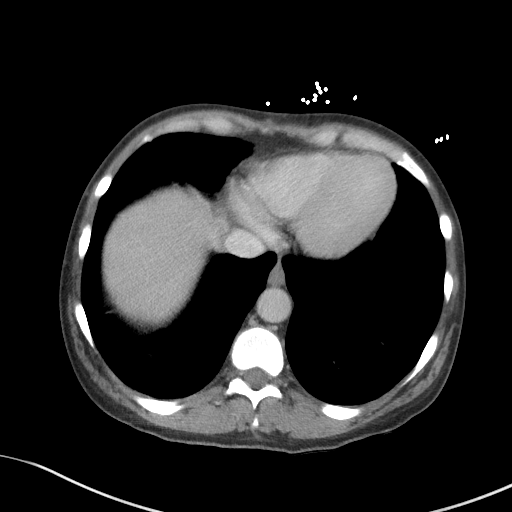

[Series 4: coronal st · coronal · 0.64mm/px · 3 of 93 slices shown]
[im 31/93  soft-tissue]
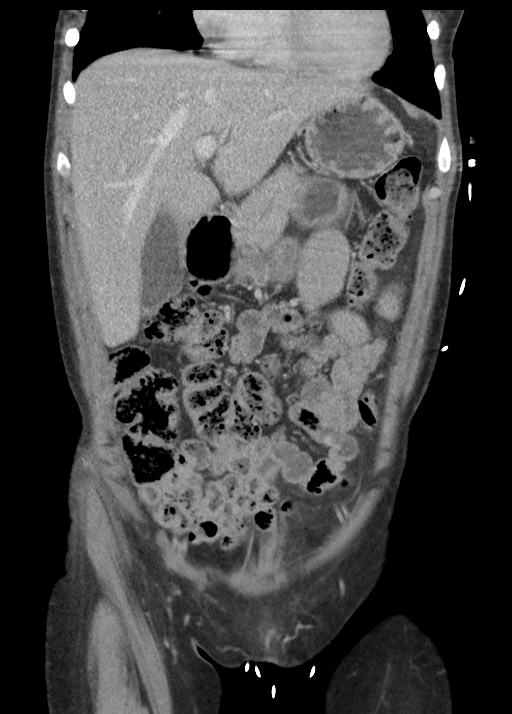
[im 41/93  soft-tissue]
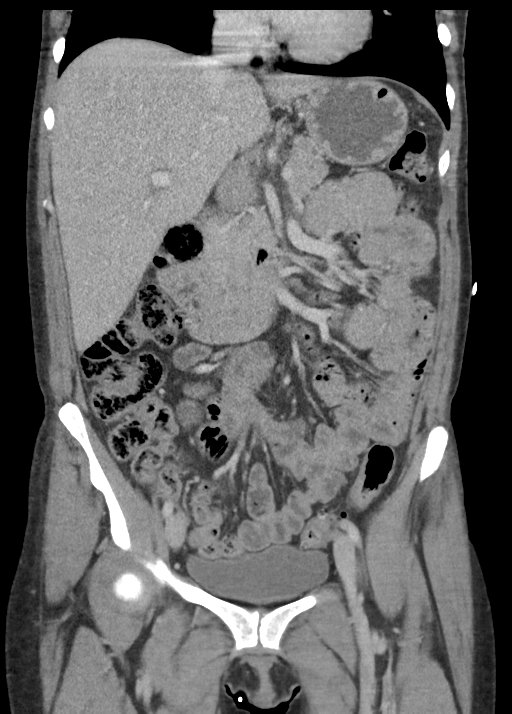
[im 52/93  soft-tissue]
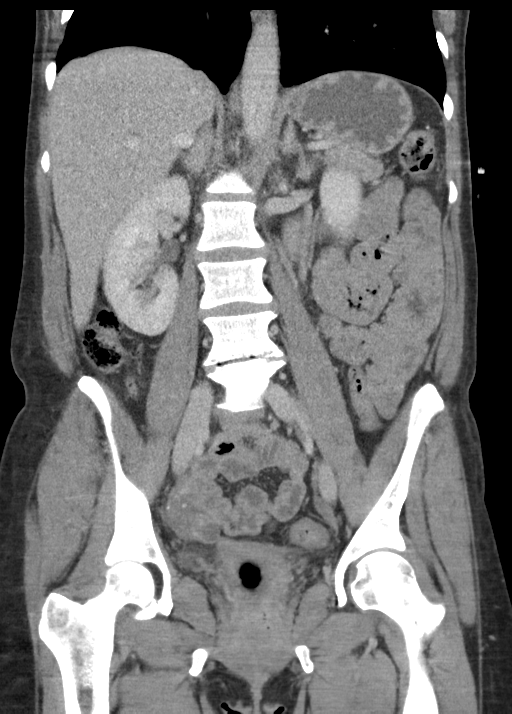

[16 of 46 positions shown; findings below may reference images not displayed]

FINDINGS: Lower chest: Borderline enlarged heart. Clear lung bases.

Hepatobiliary: Mild diffuse low density of the liver with mild
progression. Normal appearing gallbladder.

Pancreas: Unremarkable. No pancreatic ductal dilatation or
surrounding inflammatory changes.

Spleen: Surgically absent. There are 2 small accessory splenules.

Adrenals/Urinary Tract: Normal appearing adrenal glands.
Unremarkable kidneys, ureters and urinary bladder.

Stomach/Bowel: Multiple sigmoid colon diverticula without evidence
of diverticulitis. Unremarkable stomach, small bowel and appendix.

Vascular/Lymphatic: No significant vascular findings are present. No
enlarged abdominal or pelvic lymph nodes.

Reproductive: Status post hysterectomy. No adnexal masses.

Other: No abdominal wall hernia or abnormality. No abdominopelvic
ascites.

Musculoskeletal: Mild dextroconvex thoracolumbar scoliosis. Lumbar
and lower thoracic spine degenerative changes.
IMPRESSION: 1. No acute abnormality.
2. Mild diffuse hepatic steatosis with mild progression.
3. Sigmoid diverticulosis.
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
## 2024-02-19 IMAGING — CT CT ABD-PELV W/O CM
2 of 4 series · 16 of 46 positions shown, 18 images · non-contrast
Comparison: 07/08/2020

CLINICAL DATA: Severe abdominal pain and tenderness to palpation.



[Series 3: ap without · axial · non-contrast · 0.66mm/px · z∈[+766,+1166]mm · 13 of 91 slices shown, 15 images]
[im 6/91  soft-tissue]
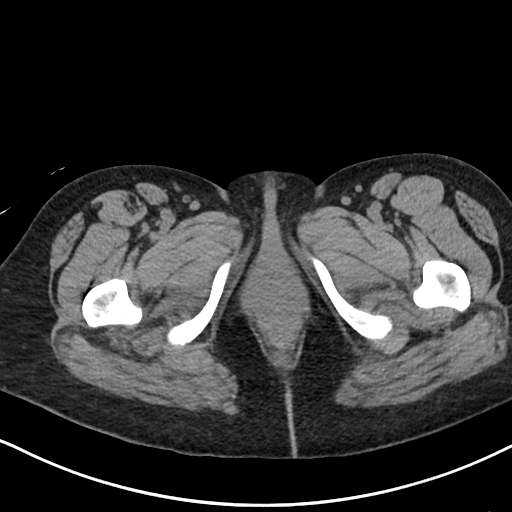
[im 6/91  bone]
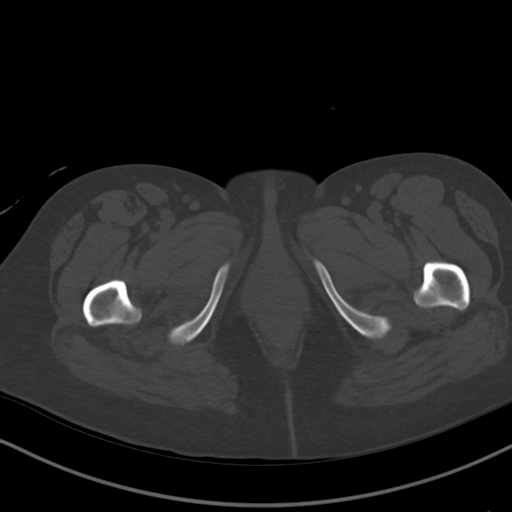
[im 11/91  soft-tissue]
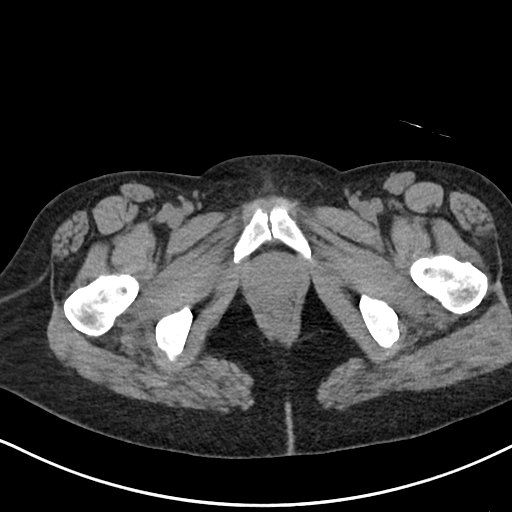
[im 21/91  soft-tissue]
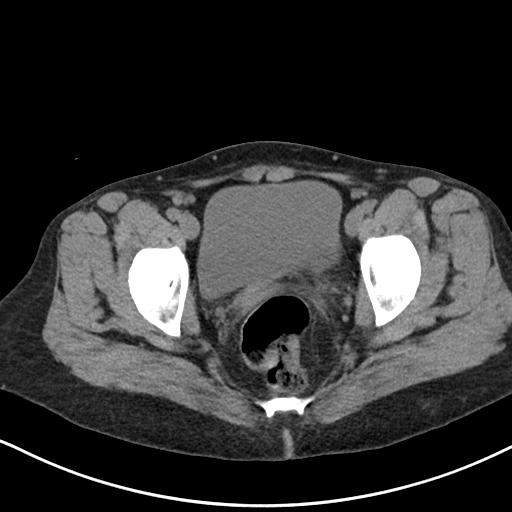
[im 26/91  soft-tissue]
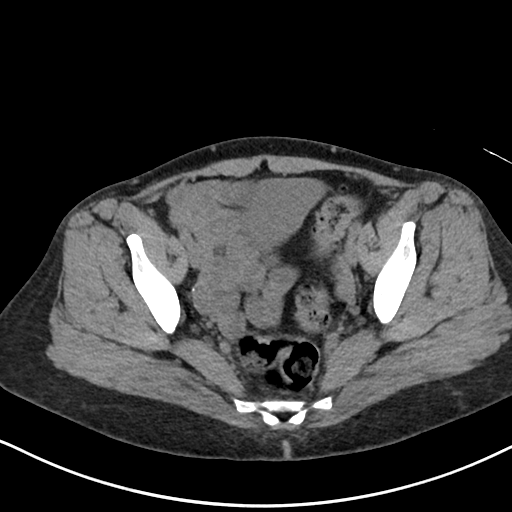
[im 31/91  soft-tissue]
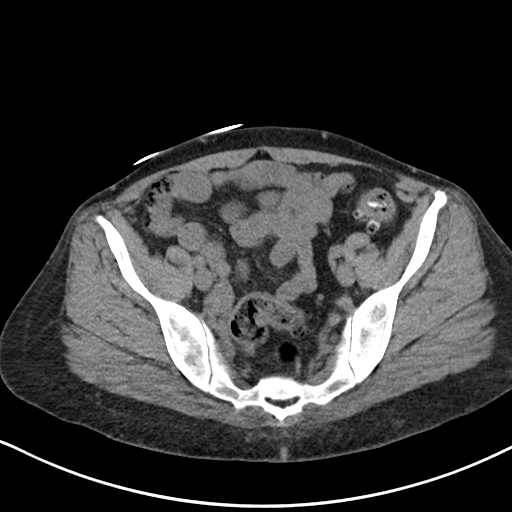
[im 41/91  soft-tissue]
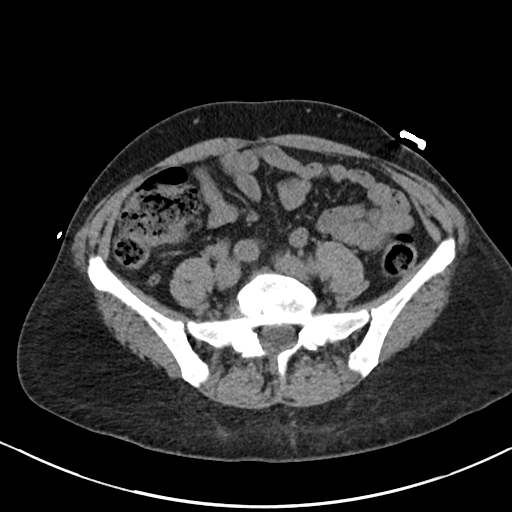
[im 46/91  soft-tissue]
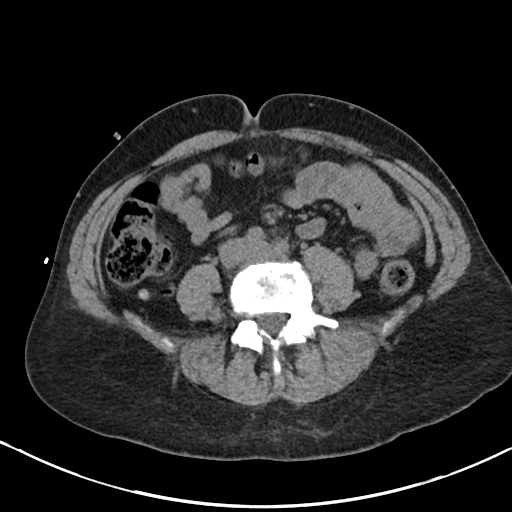
[im 51/91  soft-tissue]
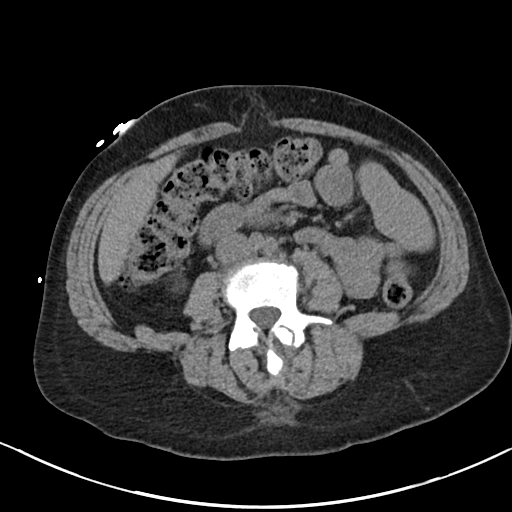
[im 61/91  soft-tissue]
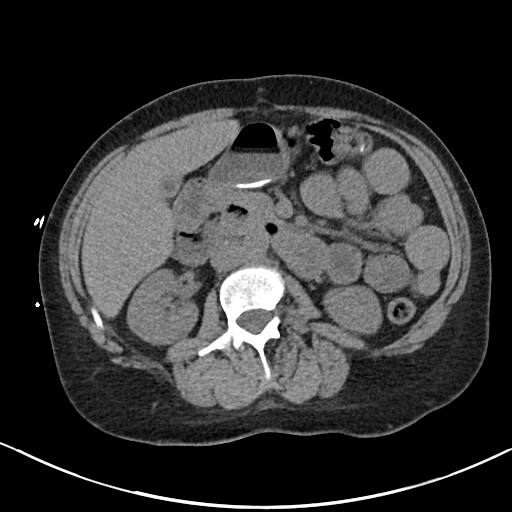
[im 61/91  bone]
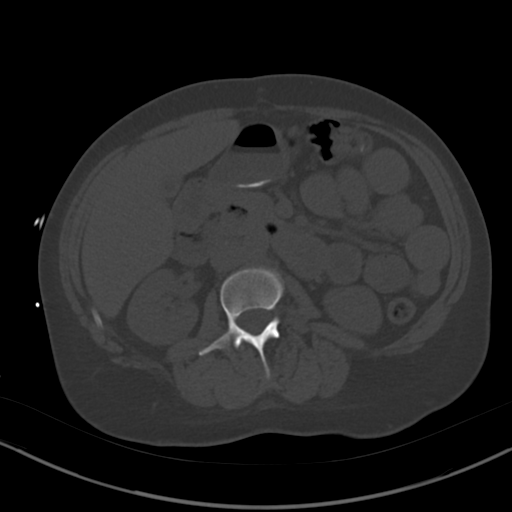
[im 66/91  soft-tissue]
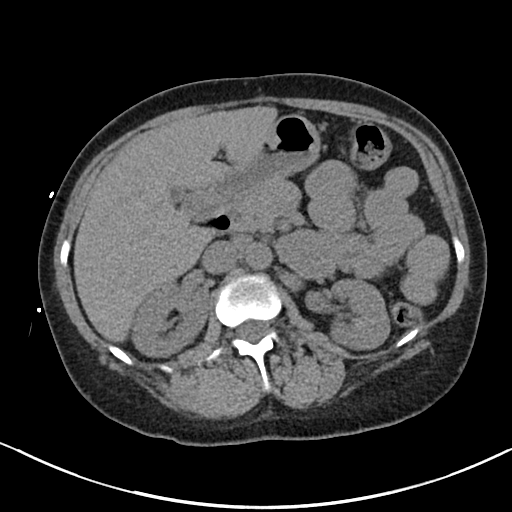
[im 71/91  soft-tissue]
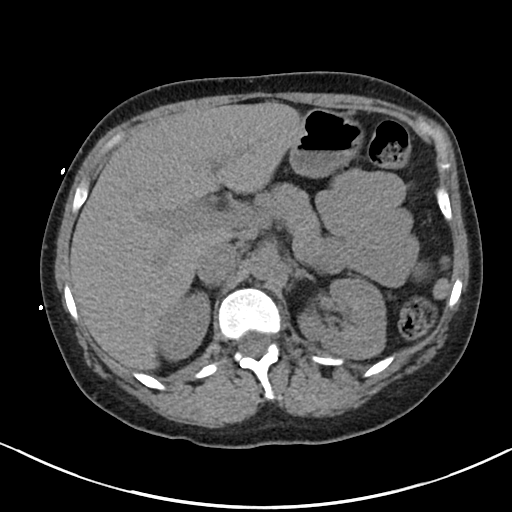
[im 81/91  soft-tissue]
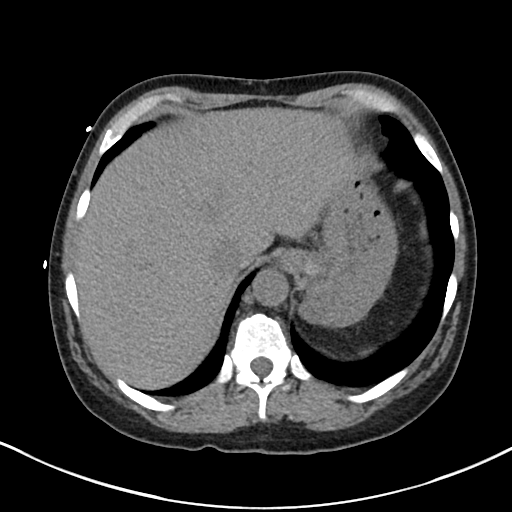
[im 86/91  soft-tissue]
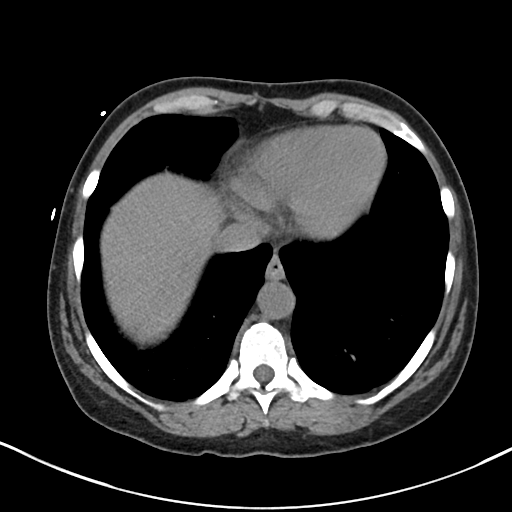

[Series 6: cor · coronal · 0.71mm/px · 3 of 88 slices shown]
[im 30/88  soft-tissue]
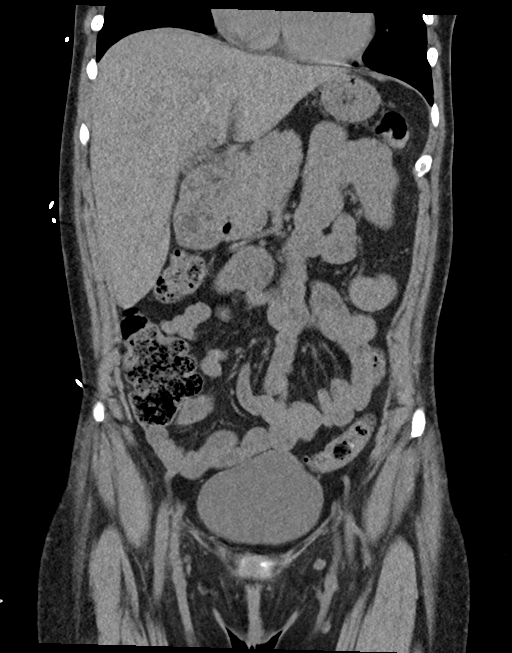
[im 39/88  soft-tissue]
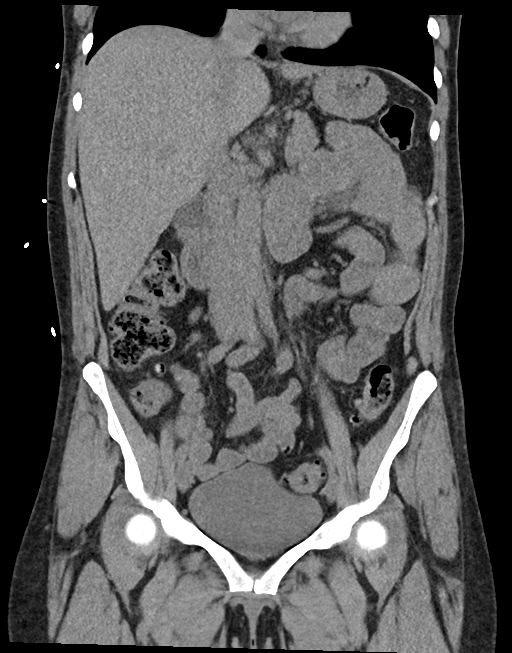
[im 49/88  soft-tissue]
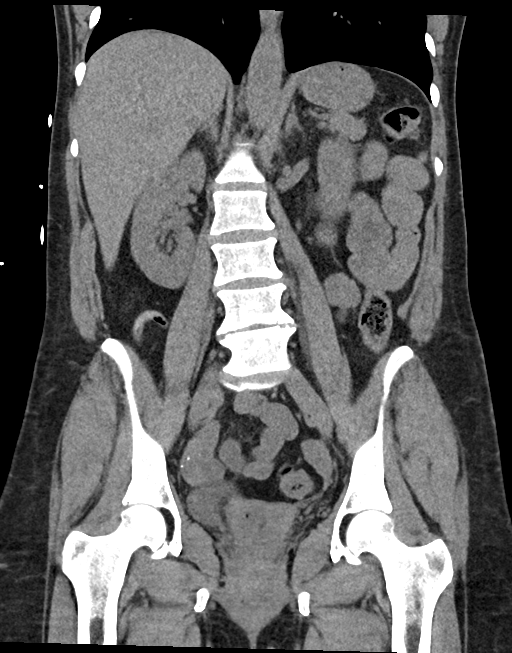

[16 of 46 positions shown; findings below may reference images not displayed]

FINDINGS: Lower chest: No acute findings.

Hepatobiliary: No mass visualized on this unenhanced exam.
Gallbladder is unremarkable. No evidence of biliary ductal
dilatation.

Pancreas: No mass or inflammatory process visualized on this
unenhanced exam.

Spleen: Prior splenectomy, with several small hypertrophied
accessory splenules again seen.

Adrenals/Urinary tract: No evidence of urolithiasis or
hydronephrosis. Unremarkable unopacified urinary bladder.

Stomach/Bowel: No evidence of obstruction, inflammatory process, or
abnormal fluid collections. Normal appendix visualized.
Diverticulosis is seen mainly involving the sigmoid colon, however
there is no evidence of diverticulitis.

Vascular/Lymphatic: No pathologically enlarged lymph nodes
identified. No evidence of abdominal aortic aneurysm.

Reproductive: Prior hysterectomy noted. Adnexal regions are
unremarkable in appearance.

Other: Stable small paraumbilical hernia which contains only omental
fat.

Musculoskeletal:  No suspicious bone lesions identified.
IMPRESSION: No acute findings.

Colonic diverticulosis, without radiographic evidence of
diverticulitis.

Stable small paraumbilical hernia which contains only omental fat.
